# Patient Record
Sex: Male | Born: 1940 | Race: White | Hispanic: No | Marital: Married
Health system: Southern US, Community
[De-identification: ages and names within clinical notes are randomized; demographics above are authoritative.]

## PROBLEM LIST (undated history)

## (undated) DIAGNOSIS — I10 Essential (primary) hypertension: Secondary | ICD-10-CM

## (undated) DIAGNOSIS — L929 Granulomatous disorder of the skin and subcutaneous tissue, unspecified: Secondary | ICD-10-CM

## (undated) DIAGNOSIS — I825Z2 Chronic embolism and thrombosis of unspecified deep veins of left distal lower extremity: Secondary | ICD-10-CM

## (undated) DIAGNOSIS — I219 Acute myocardial infarction, unspecified: Secondary | ICD-10-CM

## (undated) DIAGNOSIS — D099 Carcinoma in situ, unspecified: Secondary | ICD-10-CM

## (undated) DIAGNOSIS — R972 Elevated prostate specific antigen [PSA]: Secondary | ICD-10-CM

## (undated) DIAGNOSIS — I2699 Other pulmonary embolism without acute cor pulmonale: Secondary | ICD-10-CM

## (undated) DIAGNOSIS — R21 Rash and other nonspecific skin eruption: Secondary | ICD-10-CM

## (undated) DIAGNOSIS — D689 Coagulation defect, unspecified: Secondary | ICD-10-CM

## (undated) DIAGNOSIS — D352 Benign neoplasm of pituitary gland: Secondary | ICD-10-CM

## (undated) DIAGNOSIS — N486 Induration penis plastica: Secondary | ICD-10-CM

## (undated) DIAGNOSIS — I82409 Acute embolism and thrombosis of unspecified deep veins of unspecified lower extremity: Secondary | ICD-10-CM

## (undated) DIAGNOSIS — N419 Inflammatory disease of prostate, unspecified: Secondary | ICD-10-CM

## (undated) DIAGNOSIS — D497 Neoplasm of unspecified behavior of endocrine glands and other parts of nervous system: Secondary | ICD-10-CM

## (undated) DIAGNOSIS — Z7901 Long term (current) use of anticoagulants: Secondary | ICD-10-CM

## (undated) DIAGNOSIS — J40 Bronchitis, not specified as acute or chronic: Secondary | ICD-10-CM

## (undated) DIAGNOSIS — I639 Cerebral infarction, unspecified: Secondary | ICD-10-CM

## (undated) DIAGNOSIS — R51 Headache: Principal | ICD-10-CM

## (undated) HISTORY — DX: Cerebral infarction, unspecified: I63.9

## (undated) HISTORY — DX: Essential (primary) hypertension: I10

## (undated) HISTORY — DX: Induration penis plastica: N48.6

## (undated) HISTORY — DX: Headache: R51

## (undated) HISTORY — DX: Other pulmonary embolism without acute cor pulmonale: I26.99

## (undated) HISTORY — PX: TONSILLECTOMY: SUR1361

## (undated) HISTORY — DX: Chronic embolism and thrombosis of unspecified deep veins of left distal lower extremity: I82.5Z2

## (undated) HISTORY — DX: Inflammatory disease of prostate, unspecified: N41.9

## (undated) HISTORY — DX: Coagulation defect, unspecified: D68.9

## (undated) HISTORY — PX: EYE SURGERY: SHX253

## (undated) HISTORY — DX: Carcinoma in situ, unspecified: D09.9

## (undated) HISTORY — PX: HERNIA REPAIR: SHX51

## (undated) HISTORY — DX: Granulomatous disorder of the skin and subcutaneous tissue, unspecified: L92.9

## (undated) HISTORY — DX: Long term (current) use of anticoagulants: Z79.01

## (undated) HISTORY — DX: Benign neoplasm of pituitary gland: D35.2

---
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
## 1999-05-31 ENCOUNTER — Encounter: Admission: RE | Admit: 1999-05-31 | Discharge: 1999-05-31 | Payer: Self-pay | Admitting: *Deleted

## 1999-09-23 ENCOUNTER — Encounter: Admission: RE | Admit: 1999-09-23 | Discharge: 1999-09-23 | Payer: Self-pay | Admitting: *Deleted

## 2000-06-19 ENCOUNTER — Emergency Department (HOSPITAL_COMMUNITY): Admission: EM | Admit: 2000-06-19 | Discharge: 2000-06-19 | Payer: Self-pay | Admitting: Emergency Medicine

## 2001-11-01 ENCOUNTER — Encounter: Payer: Self-pay | Admitting: Internal Medicine

## 2001-11-01 ENCOUNTER — Ambulatory Visit (HOSPITAL_COMMUNITY): Admission: RE | Admit: 2001-11-01 | Discharge: 2001-11-01 | Payer: Self-pay | Admitting: Internal Medicine

## 2002-04-16 ENCOUNTER — Ambulatory Visit (HOSPITAL_COMMUNITY): Admission: RE | Admit: 2002-04-16 | Discharge: 2002-04-16 | Payer: Self-pay | Admitting: Internal Medicine

## 2002-04-16 ENCOUNTER — Encounter: Payer: Self-pay | Admitting: Internal Medicine

## 2003-09-04 ENCOUNTER — Emergency Department (HOSPITAL_COMMUNITY): Admission: EM | Admit: 2003-09-04 | Discharge: 2003-09-04 | Payer: Self-pay | Admitting: Emergency Medicine

## 2005-02-26 ENCOUNTER — Encounter: Admission: RE | Admit: 2005-02-26 | Discharge: 2005-02-26 | Payer: Self-pay | Admitting: Internal Medicine

## 2005-04-08 ENCOUNTER — Encounter: Admission: RE | Admit: 2005-04-08 | Discharge: 2005-04-08 | Payer: Self-pay | Admitting: Neurology

## 2005-06-13 HISTORY — PX: OTHER SURGICAL HISTORY: SHX169

## 2005-08-18 ENCOUNTER — Emergency Department (HOSPITAL_COMMUNITY): Admission: EM | Admit: 2005-08-18 | Discharge: 2005-08-18 | Payer: Self-pay | Admitting: Emergency Medicine

## 2006-07-30 ENCOUNTER — Emergency Department (HOSPITAL_COMMUNITY): Admission: EM | Admit: 2006-07-30 | Discharge: 2006-07-30 | Payer: Self-pay | Admitting: Emergency Medicine

## 2009-02-05 ENCOUNTER — Encounter: Admission: RE | Admit: 2009-02-05 | Discharge: 2009-02-05 | Payer: Self-pay | Admitting: Internal Medicine

## 2010-09-06 ENCOUNTER — Ambulatory Visit
Admission: RE | Admit: 2010-09-06 | Discharge: 2010-09-06 | Disposition: A | Discharge status: Home / Self Care | Payer: Medicare Other | Source: Ambulatory Visit | Attending: Internal Medicine | Admitting: Internal Medicine

## 2010-09-06 ENCOUNTER — Other Ambulatory Visit: Payer: Self-pay | Admitting: Internal Medicine

## 2010-09-06 DIAGNOSIS — R5383 Other fatigue: Secondary | ICD-10-CM

## 2010-09-06 MED ORDER — IOHEXOL 300 MG/ML  SOLN
75.0000 mL | Freq: Once | INTRAMUSCULAR | Status: AC | PRN
  Administered 2010-09-06: 75 mL via INTRAVENOUS

## 2010-09-07 ENCOUNTER — Telehealth: Payer: Self-pay

## 2010-09-07 NOTE — Telephone Encounter (Signed)
Called and advised amy that Dr. Merla Riches needs to call and req to speak to a physcian to get pt worked in asap per TD  Carver Fila, CMA

## 2010-09-15 ENCOUNTER — Encounter: Payer: Self-pay | Admitting: Pulmonary Disease

## 2010-09-16 ENCOUNTER — Encounter: Payer: Self-pay | Admitting: Pulmonary Disease

## 2010-09-16 ENCOUNTER — Ambulatory Visit (INDEPENDENT_AMBULATORY_CARE_PROVIDER_SITE_OTHER): Payer: Medicare Other | Admitting: Pulmonary Disease

## 2010-09-16 VITALS — BP 112/70 | HR 56 | Temp 98.1°F | Ht 73.0 in | Wt 188.2 lb

## 2010-09-16 DIAGNOSIS — I2699 Other pulmonary embolism without acute cor pulmonale: Secondary | ICD-10-CM

## 2010-09-16 NOTE — Assessment & Plan Note (Signed)
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  He is on anticoagulation, and feels better with improved exertional tolerance.  I think he needs treatment for 6mos with coumadin, and discontinue at that point if doing well.  He apparently has had a hypercoagulable profile sent by [facility], but I do not have access to those results.  If not done, would check after being off coumadin for a period of time.  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  Barring any complications, he will f/u with me as needed.

## 2010-09-16 NOTE — Patient Instructions (Signed)
Continue on coumadin for a 6mos course, continue with INR monitoring as you are doing Take more frequent breaks on long drives, do ankle pumps followup with me as needed.

## 2010-09-16 NOTE — Progress Notes (Signed)
  Subjective:    Patient ID: Jay Day, male    DOB: Feb 05, 1941, 70 y.o.   MRN: [id number]  HPI The pt is a 70y/o male who I have been asked to see for pulmonary embolism.  He has a h/o calf dvt 83yrs ago, and was treated with aspirin.  He underwent CT chest last month for atypical cp, and was found to have findings c/w old granulomatous disease (went to school in [place], h/o neg. ppd), as well as subacute clot in RLL PA branches.  He was started on lovenox and coumadin, and being managed by an anticoagulation clinic.  He feels greatly improved, and has returned to his exercise regimen of swimming.  The pt has no FHx of VTE, and apparently a hypercoagulable panel was negative at the time of his diagnosis.  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  He has not had any recent ortho problems, nor has he been on long flights.        Review of Systems  Constitutional: Positive for unexpected weight change. Negative for fever and appetite change.  HENT: Positive for rhinorrhea. Negative for ear pain, congestion, sore throat, sneezing, trouble swallowing, dental problem and postnasal drip.   Eyes: Negative for redness.  Respiratory: Positive for shortness of breath. Negative for cough and wheezing.   Cardiovascular: Negative for chest pain, palpitations and leg swelling.  Gastrointestinal: Negative for nausea, abdominal pain and diarrhea.  Genitourinary: Negative for dysuria.  Musculoskeletal: Negative for joint swelling.  Skin: Negative for rash.  Neurological: Negative for syncope and headaches.  Hematological: Does not bruise/bleed easily.  Psychiatric/Behavioral: Negative for dysphoric mood. The patient is not nervous/anxious.        Objective:   Physical Exam Constitutional:  Well developed, no acute distress  HENT:  Nares patent without discharge.  Deviated septum to left with narrowing.  Oropharynx without  exudate, palate and uvula are normal  Eyes:  Perrla, eomi, no scleral icterus  Neck:  No JVD, no TMG  Cardiovascular:  Normal rate, regular rhythm, no rubs or gallops.  No murmurs        Intact distal pulses  Pulmonary :  Normal breath sounds, no stridor or respiratory distress   No rales, rhonchi, or wheezing  Abdominal:  Soft, nondistended, bowel sounds present.  No tenderness noted.   Musculoskeletal:  No lower extremity edema noted.  Lymph Nodes:  No cervical lymphadenopathy noted  Skin:  No cyanosis noted  Neurologic:  Alert, appropriate, moves all 4 extremities without obvious deficit.         Assessment & Plan:  Pulmonary embolism The pt has a RLL PE on recent ct chest without complications.  He is on anticoagulation, and feels better with improved exertional tolerance.  I think he needs treatment for 6mos with coumadin, and discontinue at that point if doing well.  He apparently has had a hypercoagulable profile sent by [facility], but I do not have access to those results.  If not done, would check after being off coumadin for a period of time.  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  Barring any complications, he will f/u with me as needed.

## 2010-09-18 ENCOUNTER — Encounter: Payer: Self-pay | Admitting: Pulmonary Disease

## 2011-06-30 ENCOUNTER — Ambulatory Visit (INDEPENDENT_AMBULATORY_CARE_PROVIDER_SITE_OTHER): Payer: Medicare Other | Admitting: General Surgery

## 2011-06-30 ENCOUNTER — Encounter (INDEPENDENT_AMBULATORY_CARE_PROVIDER_SITE_OTHER): Payer: Self-pay

## 2011-06-30 ENCOUNTER — Encounter (INDEPENDENT_AMBULATORY_CARE_PROVIDER_SITE_OTHER): Payer: Self-pay | Admitting: General Surgery

## 2011-06-30 VITALS — BP 108/72 | HR 65 | Temp 97.7°F | Ht 72.0 in | Wt 198.2 lb

## 2011-06-30 DIAGNOSIS — K4090 Unilateral inguinal hernia, without obstruction or gangrene, not specified as recurrent: Secondary | ICD-10-CM

## 2011-06-30 NOTE — Patient Instructions (Addendum)
You have a left inguinal hernia that is reducible. I do not find any evidence of recurrent hernia on the right side. You have decided to proceed with repair at this time. We will schedule the surgery at your convenience. We will plan to do this laparoscopically.  Inguinal Hernia, Adult Muscles help keep everything in the body in its proper place. But if a weak spot in the muscles develops, something can poke through. That is called a hernia. When this happens in the lower part of the belly (abdomen), it is called an inguinal hernia. (It takes its name from a part of the body in this region called the inguinal canal.) A weak spot in the wall of muscles lets some fat or part of the small intestine bulge through. An inguinal hernia can develop at any age. Men get them more often than women. CAUSES  In adults, an inguinal hernia develops over time.  It can be triggered by:   Suddenly straining the muscles of the lower abdomen.   Lifting heavy objects.   Straining to have a bowel movement. Difficult bowel movements (constipation) can lead to this.   Constant coughing. This may be caused by smoking or lung disease.   Being overweight.   Being pregnant.   Working at a job that requires long periods of standing or heavy lifting.   Having had an inguinal hernia before.  One type can be an emergency situation. It is called a strangulated inguinal hernia. It develops if part of the small intestine slips through the weak spot and cannot get back into the abdomen. The blood supply can be cut off. If that happens, part of the intestine may die. This situation requires emergency surgery. SYMPTOMS  Often, a small inguinal hernia has no symptoms. It is found when a healthcare provider does a physical exam. Larger hernias usually have symptoms.   In adults, symptoms may include:   A lump in the groin. This is easier to see when the person is standing. It might disappear when lying down.   In men, a  lump in the scrotum.   Pain or burning in the groin. This occurs especially when lifting, straining or coughing.   A dull ache or feeling of pressure in the groin.   Signs of a strangulated hernia can include:   A bulge in the groin that becomes very painful and tender to the touch.   A bulge that turns red or purple.   Fever, nausea and vomiting.   Inability to have a bowel movement or to pass gas.  DIAGNOSIS  To decide if you have an inguinal hernia, a healthcare provider will probably do a physical examination.  This will include asking questions about any symptoms you have noticed.   The healthcare provider might feel the groin area and ask you to cough. If an inguinal hernia is felt, the healthcare provider may try to slide it back into the abdomen.   Usually no other tests are needed.  TREATMENT  Treatments can vary. The size of the hernia makes a difference. Options include:  Watchful waiting. This is often suggested if the hernia is small and you have had no symptoms.   No medical procedure will be done unless symptoms develop.   You will need to watch closely for symptoms. If any occur, contact your healthcare provider right away.   Surgery. This is used if the hernia is larger or you have symptoms.   Open surgery. This is usually  an outpatient procedure (you will not stay overnight in a hospital). An cut (incision) is made through the skin in the groin. The hernia is put back inside the abdomen. The weak area in the muscles is then repaired by herniorrhaphy or hernioplasty. Herniorrhaphy: in this type of surgery, the weak muscles are sewn back together. Hernioplasty: a patch or mesh is used to close the weak area in the abdominal wall.   Laparoscopy. In this procedure, a surgeon makes small incisions. A thin tube with a tiny video camera (called a laparoscope) is put into the abdomen. The surgeon repairs the hernia with mesh by looking with the video camera and using two  long instruments.  HOME CARE INSTRUCTIONS   After surgery to repair an inguinal hernia:   You will need to take pain medicine prescribed by your healthcare provider. Follow all directions carefully.   You will need to take care of the wound from the incision.   Your activity will be restricted for awhile. This will probably include no heavy lifting for several weeks. You also should not do anything too active for a few weeks. When you can return to work will depend on the type of job that you have.   During "watchful waiting" periods, you should:   Maintain a healthy weight.   Eat a diet high in fiber (fruits, vegetables and whole grains).   Drink plenty of fluids to avoid constipation. This means drinking enough water and other liquids to keep your urine clear or pale yellow.   Do not lift heavy objects.   Do not stand for long periods of time.   Quit smoking. This should keep you from developing a frequent cough.  SEEK MEDICAL CARE IF:   A bulge develops in your groin area.   You feel pain, a burning sensation or pressure in the groin. This might be worse if you are lifting or straining.   You develop a fever of more than 100.5 F (38.1 C).  SEEK IMMEDIATE MEDICAL CARE IF:   Pain in the groin increases suddenly.   A bulge in the groin gets bigger suddenly and does not go down.   For men, there is sudden pain in the scrotum. Or, the size of the scrotum increases.   A bulge in the groin area becomes red or purple and is painful to touch.   You have nausea or vomiting that does not go away.   You feel your heart beating much faster than normal.   You cannot have a bowel movement or pass gas.   You develop a fever of more than 102.0 F (38.9 C).  Document Released: 10/16/2008 Document Revised: 02/09/2011 Document Reviewed: 10/16/2008 [facility] Patient Information 2012 [place], [place].

## 2011-06-30 NOTE — Progress Notes (Signed)
Patient ID: Jay Day, male   DOB: [birth year], 71 y.o.   MRN: [id number]  Chief Complaint  Patient presents with  . Pre-op Exam    eval hernia    HPI Jay Day is a 71 y.o. male.  He is referred to me by Dr. Theressa Millard for evaluation of a left inguinal hernia.  This is a healthy gentleman. He underwent a right inguinal hernia repair in 1962, and has had no problems on the right side since. He was recently evaluated by Dr. Patsi Sears for elevated PSA. He identified a left inguinal hernia. The patient really has minimal symptoms but now notices the bulge. His is interested in having this repaired before it gets any bigger.  Significant history is that he had deep venous thrombosis 2 years ago had a silent pulmonary embolism in March of 2012. He has been on Coumadin since that time. Dr. Earl Gala is contemplating taking him off the Coumadin at the end of this month. He also has hypertension and is known to have old granulomatous disease on chest CT. He is otherwise healthy and physically active. HPI  Past Medical History  Diagnosis Date  . Hypertension   . Asthma   . Pulmonary embolism   . Clotting disorder     Past Surgical History  Procedure Date  . Knee surgery 1962    left   . Hernia repair 1960    Family History  Problem Relation Age of Onset  . Pulmonary embolism Mother   . Pulmonary embolism Father   . Pulmonary embolism Maternal Grandmother   . Pulmonary embolism Maternal Grandfather   . Pulmonary embolism Paternal Grandmother   . Pulmonary embolism Paternal Grandfather     Social History History  Substance Use Topics  . Smoking status: Former Smoker -- 0.3 packs/day for 5 years    Types: Cigarettes    Quit date: 06/13/1970  . Smokeless tobacco: Never Used  . Alcohol Use: Yes     very little    No Known Allergies  Current Outpatient Prescriptions  Medication Sig Dispense Refill  . aspirin 81 MG tablet Take 81 mg by mouth daily.        .  captopril (CAPOTEN) 25 MG tablet 1/2 tab by mouth twice daily       . enoxaparin (LOVENOX) 80 MG/0.8ML SOLN Twice daily.      Marland Kitchen latanoprost (XALATAN) 0.005 % ophthalmic solution Place 1 drop into both eyes At bedtime.      Marland Kitchen warfarin (COUMADIN) 5 MG tablet Take as directed        Review of Systems Review of Systems  Constitutional: Negative for fever, chills and unexpected weight change.  HENT: Negative for hearing loss, congestion, sore throat, trouble swallowing and voice change.   Eyes: Negative for visual disturbance.  Respiratory: Negative for cough and wheezing.   Cardiovascular: Negative for chest pain, palpitations and leg swelling.  Gastrointestinal: Negative for nausea, vomiting, abdominal pain, diarrhea, constipation, blood in stool, abdominal distention, anal bleeding and rectal pain.  Genitourinary: Negative for hematuria and difficulty urinating.  Musculoskeletal: Negative for arthralgias.  Skin: Negative for rash and wound.  Neurological: Negative for seizures, syncope, weakness and headaches.  Hematological: Negative for adenopathy. Does not bruise/bleed easily.  Psychiatric/Behavioral: Negative for confusion.    Blood pressure 108/72, pulse 65, temperature 97.7 F (36.5 C), temperature source Temporal, height 6' (1.829 m), weight 198 lb 3.2 oz (89.903 kg), SpO2 96.00%.  Physical Exam Physical Exam  Constitutional:  He is oriented to person, place, and time. He appears well-developed and well-nourished. No distress.  HENT:  Head: Normocephalic.  Nose: Nose normal.  Mouth/Throat: No oropharyngeal exudate.  Eyes: Conjunctivae and EOM are normal. Pupils are equal, round, and reactive to light. Right eye exhibits no discharge. Left eye exhibits no discharge. No scleral icterus.  Neck: Normal range of motion. Neck supple. No JVD present. No tracheal deviation present. No thyromegaly present.  Cardiovascular: Normal rate, regular rhythm, normal heart sounds and intact  distal pulses.   No murmur heard. Pulmonary/Chest: Effort normal and breath sounds normal. No stridor. No respiratory distress. He has no wheezes. He has no rales. He exhibits no tenderness.  Abdominal: Soft. Bowel sounds are normal. He exhibits no distension and no mass. There is no tenderness. There is no rebound and no guarding.  Genitourinary:     Musculoskeletal: Normal range of motion. He exhibits no edema and no tenderness.  Lymphadenopathy:    He has no cervical adenopathy.  Neurological: He is alert and oriented to person, place, and time. He has normal reflexes. Coordination normal.  Skin: Skin is warm and dry. No rash noted. He is not diaphoretic. No erythema. No pallor.  Psychiatric: He has a normal mood and affect. His behavior is normal. Judgment and thought content normal.    Data Reviewed I reviewed the office notes from Dr. Theressa Millard and Dr. Marcine Matar.  Assessment    Left inguinal hernia, minimally symptomatic. Patient desires elective repair before this enlarges.  History right inguinal hernia repair 1962, no evidence of recurrence   History of pulmonary embolism and DVT, on Coumadin.  Hypertension, well controlled  Elevated PSA, under surveillance   Plan    At the patient's request, we will proceed with repair of his left inguinal hernia with mesh. We have discussed all the techniques and he would like to have this done laparoscopically, and I think that he is a good candidate for that.  I discussed the indications and details of surgery with him. Risks and complications have been outlined, including but not limited to bleeding, infection, conversion to open laparotomy, recurrence of the hernia, nerve damage with chronic pain or numbness, injury to adjacent organs such as the intestine or bladder with major surgery, cardiac pulmonary and thromboembolic problems. He understands these issues. His questions were answered. He agrees with this plan.  We  will discontinue his Coumadin 5 days preop, if okay with Dr. Earl Gala. It appears Dr. Earl Gala may be contemplating discontinuing the Coumadin at this time anyway.     Angelia Mould. Derrell Lolling, M.D., [facility] General and Minimally invasive Surgery Breast and Colorectal Surgery Office:   [phone] Pager:   [phone]    06/30/2011, 9:19 AM

## 2011-07-25 ENCOUNTER — Telehealth (INDEPENDENT_AMBULATORY_CARE_PROVIDER_SITE_OTHER): Payer: Self-pay

## 2011-07-25 NOTE — Telephone Encounter (Signed)
LMOM for pt that we are waiting on clearance from Dr Earl Gala for pt to d/c coumadin. I faxed request on 1-17 and refaxed it today and also lmom for his secretary to watch for request.

## 2011-07-29 ENCOUNTER — Telehealth (INDEPENDENT_AMBULATORY_CARE_PROVIDER_SITE_OTHER): Payer: Self-pay

## 2011-07-29 NOTE — Telephone Encounter (Signed)
Received clearance and ok to d/c coumadin. Orders to surgery scheduling and clearance to MR to scan.

## 2011-08-01 ENCOUNTER — Encounter (HOSPITAL_COMMUNITY): Payer: Self-pay | Admitting: Pharmacy Technician

## 2011-08-08 ENCOUNTER — Ambulatory Visit (HOSPITAL_COMMUNITY)
Admission: RE | Admit: 2011-08-08 | Discharge: 2011-08-08 | Disposition: A | Discharge status: Home / Self Care | Payer: Medicare Other | Source: Ambulatory Visit | Attending: Anesthesiology | Admitting: Anesthesiology

## 2011-08-08 ENCOUNTER — Encounter (HOSPITAL_COMMUNITY): Payer: Self-pay

## 2011-08-08 ENCOUNTER — Encounter (HOSPITAL_COMMUNITY)
Admission: RE | Admit: 2011-08-08 | Discharge: 2011-08-08 | Disposition: A | Discharge status: Home / Self Care | Payer: Medicare Other | Source: Ambulatory Visit | Attending: General Surgery | Admitting: General Surgery

## 2011-08-08 ENCOUNTER — Other Ambulatory Visit: Payer: Self-pay

## 2011-08-08 DIAGNOSIS — Z01810 Encounter for preprocedural cardiovascular examination: Secondary | ICD-10-CM | POA: Insufficient documentation

## 2011-08-08 DIAGNOSIS — Z01811 Encounter for preprocedural respiratory examination: Secondary | ICD-10-CM | POA: Insufficient documentation

## 2011-08-08 DIAGNOSIS — Z01818 Encounter for other preprocedural examination: Secondary | ICD-10-CM | POA: Insufficient documentation

## 2011-08-08 HISTORY — DX: Elevated prostate specific antigen (PSA): R97.20

## 2011-08-08 HISTORY — DX: Bronchitis, not specified as acute or chronic: J40

## 2011-08-08 HISTORY — DX: Neoplasm of unspecified behavior of endocrine glands and other parts of nervous system: D49.7

## 2011-08-08 LAB — SURGICAL PCR SCREEN: Staphylococcus aureus: POSITIVE — AB

## 2011-08-08 LAB — COMPREHENSIVE METABOLIC PANEL
ALT: 17 U/L (ref 0–53)
Albumin: 3.5 g/dL (ref 3.5–5.2)
Alkaline Phosphatase: 62 U/L (ref 39–117)
Chloride: 107 mEq/L (ref 96–112)
Potassium: 4.2 mEq/L (ref 3.5–5.1)
Sodium: 141 mEq/L (ref 135–145)
Total Bilirubin: 0.5 mg/dL (ref 0.3–1.2)
Total Protein: 6.8 g/dL (ref 6.0–8.3)

## 2011-08-08 LAB — DIFFERENTIAL
Basophils Relative: 0 % (ref 0–1)
Eosinophils Absolute: 0.3 10*3/uL (ref 0.0–0.7)
Eosinophils Relative: 5 % (ref 0–5)
Lymphs Abs: 1.1 10*3/uL (ref 0.7–4.0)
Monocytes Absolute: 0.9 10*3/uL (ref 0.1–1.0)
Monocytes Relative: 13 % — ABNORMAL HIGH (ref 3–12)

## 2011-08-08 LAB — CBC
HCT: 44.3 % (ref 39.0–52.0)
Hemoglobin: 15.4 g/dL (ref 13.0–17.0)
MCHC: 34.8 g/dL (ref 30.0–36.0)
RDW: 12.3 % (ref 11.5–15.5)
WBC: 6.3 10*3/uL (ref 4.0–10.5)

## 2011-08-08 NOTE — Pre-Procedure Instructions (Signed)
[address]  08/08/2011   Your procedure is scheduled on:  Friday August 12, 2011  Report to [facility] at 1130 AM.  Call this number if you have problems the morning of surgery: [phone]   Remember:   Do not eat food:After Midnight.  May have clear liquids: up to 4 Hours before arrival. ( up to 7:30am)  Clear liquids include soda, tea, black coffee, apple or grape juice, broth.  Take these medicines the morning of surgery with A SIP OF WATER: bromocriptine   Do not wear jewelry, make-up or nail polish.  Do not wear lotions, powders, or perfumes. You may wear deodorant.  Do not shave 48 hours prior to surgery.  Do not bring valuables to the hospital.  Contacts, dentures or bridgework may not be worn into surgery.  Leave suitcase in the car. After surgery it may be brought to your room.  For patients admitted to the hospital, checkout time is 11:00 AM the day of discharge.   Patients discharged the day of surgery will not be allowed to drive home.  Name and phone number of your driver: Yuki Purves [phone]  Special Instructions: CHG Shower Use Special Wash: 1/2 bottle night before surgery and 1/2 bottle morning of surgery.   Please read over the following fact sheets that you were given: Pain Booklet, Coughing and Deep Breathing, MRSA Information and Surgical Site Infection Prevention

## 2011-08-11 MED ORDER — CEFAZOLIN SODIUM-DEXTROSE 2-3 GM-% IV SOLR
2.0000 g | INTRAVENOUS | Status: AC
  Administered 2011-08-12: 2 g via INTRAVENOUS
  Filled 2011-08-11: qty 50

## 2011-08-11 MED ORDER — HEPARIN SODIUM (PORCINE) 5000 UNIT/ML IJ SOLN
5000.0000 [IU] | Freq: Once | INTRAMUSCULAR | Status: AC
  Administered 2011-08-12: 5000 [IU] via SUBCUTANEOUS
  Filled 2011-08-11: qty 1

## 2011-08-11 MED ORDER — CHLORHEXIDINE GLUCONATE 4 % EX LIQD
1.0000 "application " | Freq: Once | CUTANEOUS | Status: DC
  Filled 2011-08-11: qty 15

## 2011-08-11 NOTE — H&P (Signed)
Jay Day     MRN: [id number]   Description: 71 year old male  Provider: Ernestene Mention, MD  Department: [facility]        Diagnoses     Left inguinal hernia   - Primary    550.90   Vitals     BP Pulse Temp(Src) Ht Wt BMI    108/72  65  97.7 F (36.5 C) (Temporal)  6' (1.829 m)  198 lb 3.2 oz (89.903 kg)  26.88 kg/m2                     History and Physical    Ernestene Mention, MD   Patient ID: Jay Day, male   DOB: [birth year], 71 y.o.   MRN: [id number]    Chief Complaint   Patient presents with   .  Pre-op Exam       eval hernia        HPI Jay Day is a 71 y.o. male.  He is referred to me by Dr. Theressa Millard for evaluation of a left inguinal hernia.  This is a healthy gentleman. He underwent a right inguinal hernia repair in 1962, and has had no problems on the right side since. He was recently evaluated by Dr. Patsi Sears for elevated PSA. He identified a left inguinal hernia. The patient really has minimal symptoms but now notices the bulge. His is interested in having this repaired before it gets any bigger.  Significant history is that he had deep venous thrombosis 2 years ago had a silent pulmonary embolism in March of 2012. He has been on Coumadin since that time. Dr. Earl Gala is contemplating taking him off the Coumadin at the end of this month. He also has hypertension and is known to have old granulomatous disease on chest CT. He is otherwise healthy and physically active.     Past Medical History   Diagnosis  Date   .  Hypertension     .  Asthma     .  Pulmonary embolism     .  Clotting disorder         Past Surgical History   Procedure  Date   .  Knee surgery  1962       left    .  Hernia repair  1960       Family History   Problem  Relation  Age of Onset   .  Pulmonary embolism  Mother     .  Pulmonary embolism  Father     .  Pulmonary embolism  Maternal Grandmother     .  Pulmonary embolism  Maternal  Grandfather     .  Pulmonary embolism  Paternal Grandmother     .  Pulmonary embolism  Paternal Grandfather        Social History History   Substance Use Topics   .  Smoking status:  Former Smoker -- 0.3 packs/day for 5 years       Types:  Cigarettes       Quit date:  06/13/1970   .  Smokeless tobacco:  Never Used   .  Alcohol Use:  Yes         very little      No Known Allergies    Current Outpatient Prescriptions   Medication  Sig  Dispense  Refill   .  aspirin 81 MG tablet  Take 81 mg by mouth daily.           Marland Kitchen  captopril (CAPOTEN) 25 MG tablet  1/2 tab by mouth twice daily          .  enoxaparin (LOVENOX) 80 MG/0.8ML SOLN  Twice daily.         Marland Kitchen  latanoprost (XALATAN) 0.005 % ophthalmic solution  Place 1 drop into both eyes At bedtime.         Marland Kitchen  warfarin (COUMADIN) 5 MG tablet  Take as directed            Review of Systems   Constitutional: Negative for fever, chills and unexpected weight change.  HENT: Negative for hearing loss, congestion, sore throat, trouble swallowing and voice change.   Eyes: Negative for visual disturbance.  Respiratory: Negative for cough and wheezing.   Cardiovascular: Negative for chest pain, palpitations and leg swelling.  Gastrointestinal: Negative for nausea, vomiting, abdominal pain, diarrhea, constipation, blood in stool, abdominal distention, anal bleeding and rectal pain.  Genitourinary: Negative for hematuria and difficulty urinating.  Musculoskeletal: Negative for arthralgias.  Skin: Negative for rash and wound.  Neurological: Negative for seizures, syncope, weakness and headaches.  Hematological: Negative for adenopathy. Does not bruise/bleed easily.  Psychiatric/Behavioral: Negative for confusion.    Blood pressure 108/72, pulse 65, temperature 97.7 F (36.5 C), temperature source Temporal, height 6' (1.829 m), weight 198 lb 3.2 oz (89.903 kg), SpO2 96.00%.   Physical Exam   Constitutional: He is oriented to person,  place, and time. He appears well-developed and well-nourished. No distress.  HENT:   Head: Normocephalic.   Nose: Nose normal.   Mouth/Throat: No oropharyngeal exudate.  Eyes: Conjunctivae and EOM are normal. Pupils are equal, round, and reactive to light. Right eye exhibits no discharge. Left eye exhibits no discharge. No scleral icterus.  Neck: Normal range of motion. Neck supple. No JVD present. No tracheal deviation present. No thyromegaly present.  Cardiovascular: Normal rate, regular rhythm, normal heart sounds and intact distal pulses.    No murmur heard. Pulmonary/Chest: Effort normal and breath sounds normal. No stridor. No respiratory distress. He has no wheezes. He has no rales. He exhibits no tenderness.  Abdominal: Soft. Bowel sounds are normal. He exhibits no distension and no mass. There is no tenderness. There is no rebound and no guarding.  Genitourinary:  Well healed scar right groin, no hernia recurrence.  Moderate sized left inguinal hernia, reducible, does not extend into scrotum. Musculoskeletal: Normal range of motion. He exhibits no edema and no tenderness.  Lymphadenopathy:    He has no cervical adenopathy.  Neurological: He is alert and oriented to person, place, and time. He has normal reflexes. Coordination normal.  Skin: Skin is warm and dry. No rash noted. He is not diaphoretic. No erythema. No pallor.  Psychiatric: He has a normal mood and affect. His behavior is normal. Judgment and thought content normal.    Data Reviewed I reviewed the office notes from Dr. Theressa Millard and Dr. Marcine Matar.   Assessment Left inguinal hernia, minimally symptomatic. Patient desires elective repair before this enlarges.  History right inguinal hernia repair 1962, no evidence of recurrence   History of pulmonary embolism and DVT, on Coumadin.  Hypertension, well controlled  Elevated PSA, under surveillance    Plan At the patient's request, we will proceed with  repair of his left inguinal hernia with mesh. We have discussed all the techniques and he would like to have this done laparoscopically, and I think that he is a good candidate for that.  I discussed the indications and  details of surgery with him. Risks and complications have been outlined, including but not limited to bleeding, infection, conversion to open laparotomy, recurrence of the hernia, nerve damage with chronic pain or numbness, injury to adjacent organs such as the intestine or bladder with major surgery, cardiac pulmonary and thromboembolic problems. He understands these issues. His questions were answered. He agrees with this plan.  We will discontinue his Coumadin 5 days preop, if okay with Dr. Earl Gala. It appears Dr. Earl Gala may be contemplating discontinuing the Coumadin at this time anyway.   Angelia Mould. Derrell Lolling, M.D., [facility] General and Minimally invasive Surgery Breast and Colorectal Surgery Office:   [phone] Pager:   [phone]

## 2011-08-12 ENCOUNTER — Encounter (HOSPITAL_COMMUNITY): Payer: Self-pay | Admitting: Anesthesiology

## 2011-08-12 ENCOUNTER — Ambulatory Visit (HOSPITAL_COMMUNITY)
Admission: RE | Admit: 2011-08-12 | Discharge: 2011-08-12 | Disposition: A | Discharge status: Home / Self Care | Payer: Medicare Other | Source: Ambulatory Visit | Attending: General Surgery | Admitting: General Surgery

## 2011-08-12 ENCOUNTER — Encounter (HOSPITAL_COMMUNITY)
Admission: RE | Disposition: A | Discharge status: Home / Self Care | Payer: Self-pay | Source: Ambulatory Visit | Attending: General Surgery

## 2011-08-12 ENCOUNTER — Ambulatory Visit (HOSPITAL_COMMUNITY): Payer: Medicare Other | Admitting: Anesthesiology

## 2011-08-12 ENCOUNTER — Encounter (HOSPITAL_COMMUNITY): Payer: Self-pay | Admitting: *Deleted

## 2011-08-12 DIAGNOSIS — Z9889 Other specified postprocedural states: Secondary | ICD-10-CM

## 2011-08-12 DIAGNOSIS — Z86718 Personal history of other venous thrombosis and embolism: Secondary | ICD-10-CM | POA: Insufficient documentation

## 2011-08-12 DIAGNOSIS — Z7901 Long term (current) use of anticoagulants: Secondary | ICD-10-CM | POA: Insufficient documentation

## 2011-08-12 DIAGNOSIS — I10 Essential (primary) hypertension: Secondary | ICD-10-CM | POA: Insufficient documentation

## 2011-08-12 DIAGNOSIS — K4090 Unilateral inguinal hernia, without obstruction or gangrene, not specified as recurrent: Secondary | ICD-10-CM | POA: Insufficient documentation

## 2011-08-12 HISTORY — PX: INGUINAL HERNIA REPAIR: SHX194

## 2011-08-12 SURGERY — REPAIR, HERNIA, INGUINAL, LAPAROSCOPIC
Anesthesia: General | Site: Abdomen | Laterality: Left | Wound class: Clean

## 2011-08-12 MED ORDER — BUPIVACAINE-EPINEPHRINE 0.25% -1:200000 IJ SOLN
INTRAMUSCULAR | Status: DC | PRN
  Administered 2011-08-12: 1 mL
  Administered 2011-08-12: 7 mL

## 2011-08-12 MED ORDER — PROPOFOL 10 MG/ML IV EMUL
INTRAVENOUS | Status: DC | PRN
  Administered 2011-08-12: 200 mg via INTRAVENOUS

## 2011-08-12 MED ORDER — SODIUM CHLORIDE 0.9 % IJ SOLN: 3.0000 mL | Freq: Two times a day (BID) | INTRAMUSCULAR | Status: DC

## 2011-08-12 MED ORDER — FENTANYL CITRATE 0.05 MG/ML IJ SOLN
INTRAMUSCULAR | Status: DC | PRN
  Administered 2011-08-12 (×2): 100 ug via INTRAVENOUS
  Administered 2011-08-12: 50 ug via INTRAVENOUS

## 2011-08-12 MED ORDER — ACETAMINOPHEN 325 MG PO TABS: 650.0000 mg | ORAL_TABLET | ORAL | Status: DC | PRN

## 2011-08-12 MED ORDER — 0.9 % SODIUM CHLORIDE (POUR BTL) OPTIME
TOPICAL | Status: DC | PRN
  Administered 2011-08-12: 1000 mL

## 2011-08-12 MED ORDER — HYDROMORPHONE HCL PF 1 MG/ML IJ SOLN: 0.2500 mg | INTRAMUSCULAR | Status: DC | PRN

## 2011-08-12 MED ORDER — SODIUM CHLORIDE 0.9 % IV SOLN: INTRAVENOUS | Status: DC

## 2011-08-12 MED ORDER — MUPIROCIN 2 % EX OINT
TOPICAL_OINTMENT | CUTANEOUS | Status: AC
  Filled 2011-08-12: qty 22

## 2011-08-12 MED ORDER — ACETAMINOPHEN 650 MG RE SUPP: 650.0000 mg | RECTAL | Status: DC | PRN

## 2011-08-12 MED ORDER — MUPIROCIN 2 % EX OINT
TOPICAL_OINTMENT | Freq: Once | CUTANEOUS | Status: AC
  Administered 2011-08-12: 12:00:00 via NASAL

## 2011-08-12 MED ORDER — ONDANSETRON HCL 4 MG/2ML IJ SOLN
INTRAMUSCULAR | Status: DC | PRN
  Administered 2011-08-12: 4 mg via INTRAVENOUS

## 2011-08-12 MED ORDER — MIDAZOLAM HCL 5 MG/5ML IJ SOLN
INTRAMUSCULAR | Status: DC | PRN
  Administered 2011-08-12: 2 mg via INTRAVENOUS

## 2011-08-12 MED ORDER — SODIUM CHLORIDE 0.9 % IR SOLN
Status: DC | PRN
  Administered 2011-08-12: 1000 mL

## 2011-08-12 MED ORDER — SODIUM CHLORIDE 0.9 % IJ SOLN: 3.0000 mL | INTRAMUSCULAR | Status: DC | PRN

## 2011-08-12 MED ORDER — ONDANSETRON HCL 4 MG/2ML IJ SOLN: 4.0000 mg | Freq: Four times a day (QID) | INTRAMUSCULAR | Status: DC | PRN

## 2011-08-12 MED ORDER — NEOSTIGMINE METHYLSULFATE 1 MG/ML IJ SOLN
INTRAMUSCULAR | Status: DC | PRN
  Administered 2011-08-12: 5 mg via INTRAVENOUS

## 2011-08-12 MED ORDER — ROCURONIUM BROMIDE 100 MG/10ML IV SOLN
INTRAVENOUS | Status: DC | PRN
  Administered 2011-08-12: 10 mg via INTRAVENOUS
  Administered 2011-08-12: 50 mg via INTRAVENOUS

## 2011-08-12 MED ORDER — EPHEDRINE SULFATE 50 MG/ML IJ SOLN
INTRAMUSCULAR | Status: DC | PRN
  Administered 2011-08-12: 5 mg via INTRAVENOUS

## 2011-08-12 MED ORDER — OXYCODONE HCL 5 MG PO TABS: 5.0000 mg | ORAL_TABLET | ORAL | Status: DC | PRN

## 2011-08-12 MED ORDER — GLYCOPYRROLATE 0.2 MG/ML IJ SOLN
INTRAMUSCULAR | Status: DC | PRN
  Administered 2011-08-12: 0.6 mg via INTRAVENOUS

## 2011-08-12 MED ORDER — MORPHINE SULFATE 2 MG/ML IJ SOLN: 2.0000 mg | INTRAMUSCULAR | Status: DC | PRN

## 2011-08-12 MED ORDER — SODIUM CHLORIDE 0.9 % IV SOLN: 250.0000 mL | INTRAVENOUS | Status: DC | PRN

## 2011-08-12 MED ORDER — HYDROCODONE-ACETAMINOPHEN 5-325 MG PO TABS: 1.0000 | ORAL_TABLET | ORAL | Status: AC | PRN

## 2011-08-12 MED ORDER — LACTATED RINGERS IV SOLN
INTRAVENOUS | Status: DC
  Administered 2011-08-12 (×2): via INTRAVENOUS

## 2011-08-12 SURGICAL SUPPLY — 44 items
ADH SKN CLS APL DERMABOND .7 (GAUZE/BANDAGES/DRESSINGS) ×3
APPLIER CLIP LOGIC TI 5 (MISCELLANEOUS) IMPLANT
APR CLP MED LRG 33X5 (MISCELLANEOUS)
CANISTER SUCTION 2500CC (MISCELLANEOUS) ×1 IMPLANT
CLOTH BEACON ORANGE TIMEOUT ST (SAFETY) ×2 IMPLANT
COVER SURGICAL LIGHT HANDLE (MISCELLANEOUS) ×2 IMPLANT
DECANTER SPIKE VIAL GLASS SM (MISCELLANEOUS) ×1 IMPLANT
DERMABOND ADVANCED (GAUZE/BANDAGES/DRESSINGS) ×3
DERMABOND ADVANCED .7 DNX12 (GAUZE/BANDAGES/DRESSINGS) ×1 IMPLANT
DEVICE SECURE STRAP 25 ABSORB (INSTRUMENTS) IMPLANT
DISSECT BALLN SPACEMKR + OVL (BALLOONS) ×2
DISSECTOR BALLN SPACEMKR + OVL (BALLOONS) ×1 IMPLANT
DISSECTOR BLUNT TIP ENDO 5MM (MISCELLANEOUS) IMPLANT
ELECT REM PT RETURN 9FT ADLT (ELECTROSURGICAL) ×2
ELECTRODE REM PT RTRN 9FT ADLT (ELECTROSURGICAL) ×1 IMPLANT
GLOVE BIOGEL M 6.5 STRL (GLOVE) ×1 IMPLANT
GLOVE BIOGEL PI IND STRL 6.5 (GLOVE) IMPLANT
GLOVE BIOGEL PI IND STRL 7.0 (GLOVE) IMPLANT
GLOVE BIOGEL PI INDICATOR 6.5 (GLOVE) ×1
GLOVE BIOGEL PI INDICATOR 7.0 (GLOVE) ×3
GLOVE ECLIPSE 6.5 STRL STRAW (GLOVE) ×1 IMPLANT
GLOVE EUDERMIC 7 POWDERFREE (GLOVE) ×2 IMPLANT
GLOVE EXAM NITRILE MD LF STRL (GLOVE) ×2 IMPLANT
GLOVE SURG SS PI 6.5 STRL IVOR (GLOVE) ×1 IMPLANT
GOWN PREVENTION PLUS XLARGE (GOWN DISPOSABLE) ×2 IMPLANT
GOWN STRL NON-REIN LRG LVL3 (GOWN DISPOSABLE) ×6 IMPLANT
KIT BASIN OR (CUSTOM PROCEDURE TRAY) ×2 IMPLANT
KIT ROOM TURNOVER OR (KITS) ×2 IMPLANT
MARKER SKIN DUAL TIP RULER LAB (MISCELLANEOUS) ×1 IMPLANT
MESH ULTRAPRO 3X6 7.6X15CM (Mesh General) ×1 IMPLANT
NDL INSUFFLATION 14GA 120MM (NEEDLE) ×1 IMPLANT
NEEDLE INSUFFLATION 14GA 120MM (NEEDLE) ×2 IMPLANT
NS IRRIG 1000ML POUR BTL (IV SOLUTION) ×2 IMPLANT
PAD ARMBOARD 7.5X6 YLW CONV (MISCELLANEOUS) ×4 IMPLANT
SET IRRIG TUBING LAPAROSCOPIC (IRRIGATION / IRRIGATOR) ×1 IMPLANT
SLEEVE ENDOPATH XCEL 5M (ENDOMECHANICALS) ×2 IMPLANT
STAPLER VISISTAT 35W (STAPLE) ×1 IMPLANT
SUT MNCRL AB 4-0 PS2 18 (SUTURE) ×2 IMPLANT
TACKER 5MM HERNIA 3.5CML NAB (ENDOMECHANICALS) IMPLANT
TOWEL OR 17X24 6PK STRL BLUE (TOWEL DISPOSABLE) ×2 IMPLANT
TOWEL OR 17X26 10 PK STRL BLUE (TOWEL DISPOSABLE) ×2 IMPLANT
TRAY FOLEY CATH 14FR (SET/KITS/TRAYS/PACK) ×2 IMPLANT
TRAY LAPAROSCOPIC (CUSTOM PROCEDURE TRAY) ×2 IMPLANT
TROCAR XCEL NON-BLD 5MMX100MML (ENDOMECHANICALS) ×2 IMPLANT

## 2011-08-12 NOTE — Anesthesia Postprocedure Evaluation (Signed)
  Anesthesia Post-op Note  Patient: Jay Day  Procedure(s) Performed: Procedure(s) (LRB): LAPAROSCOPIC INGUINAL HERNIA (Left) INSERTION OF MESH (Left)  Patient Location: PACU  Anesthesia Type: General  Level of Consciousness: awake, alert  and oriented  Airway and Oxygen Therapy: Patient Spontanous Breathing and Patient connected to nasal cannula oxygen  Post-op Pain: mild  Post-op Assessment: Post-op Vital signs reviewed and Patient's Cardiovascular Status Stable  Post-op Vital Signs: stable  Complications: No apparent anesthesia complications

## 2011-08-12 NOTE — Anesthesia Preprocedure Evaluation (Signed)
Anesthesia Evaluation  Patient identified by MRN, date of birth, ID band Patient awake    Reviewed: Allergy & Precautions, H&P , NPO status , Patient's Chart, lab work & pertinent test results  History of Anesthesia Complications Negative for: history of anesthetic complications  Airway Mallampati: II TM Distance: >3 FB Neck ROM: Full    Dental No notable dental hx. (+) Teeth Intact and Dental Advisory Given   Pulmonary neg pulmonary ROS,  H/o PE  a year ago, off of Coumadin for 4 weeks clear to auscultation  Pulmonary exam normal       Cardiovascular hypertension, Pt. on medications Regular Normal    Neuro/Psych Negative Neurological ROS     GI/Hepatic negative GI ROS, Neg liver ROS,   Endo/Other  Negative Endocrine ROS  Renal/GU negative Renal ROS     Musculoskeletal   Abdominal   Peds  Hematology negative hematology ROS (+)   Anesthesia Other Findings   Reproductive/Obstetrics                           Anesthesia Physical Anesthesia Plan  ASA: II  Anesthesia Plan: General   Post-op Pain Management:    Induction: Intravenous  Airway Management Planned: LMA  Additional Equipment:   Intra-op Plan:   Post-operative Plan:   Informed Consent: I have reviewed the patients History and Physical, chart, labs and discussed the procedure including the risks, benefits and alternatives for the proposed anesthesia with the patient or authorized representative who has indicated his/her understanding and acceptance.   Dental advisory given  Plan Discussed with: CRNA and Surgeon  Anesthesia Plan Comments: (Plan routine monitors, GA- LMA ok)        Anesthesia Quick Evaluation

## 2011-08-12 NOTE — Interval H&P Note (Signed)
History and Physical Interval Note:  08/12/2011 1:20 PM  Jay Day  has presented today for surgery, with the diagnosis of inguial hernia  The goals of treatment and the  various methods of treatment have been discussed with the patient and family. After consideration of risks, benefits and other options for treatment, the patient has consented to  Procedure(s) (LRB): LAPAROSCOPIC REPAIR LEFT INGUINAL HERNIA (Left) INSERTION OF MESH (Left) as a surgical intervention .  The patients' history has been reviewed, patient examined, no change in status, stable for surgery.  I have reviewed the patients' chart and labs.  Questions were answered to the patient's satisfaction.     Ernestene Mention

## 2011-08-12 NOTE — Op Note (Signed)
Patient Name:           Jay Day   Date of Surgery:        08/12/2011  Pre op Diagnosis:      Left inguinal hernia  Post op Diagnosis:    Direct Left inguinal hernia  Procedure:                 Laparoscopic, pre- peritoneal repair left inguinal hernia with ultra Pro mesh  Surgeon:                     Angelia Mould. Derrell Lolling, M.D., FACS  Assistant:                      none  Operative Indications:   This is a 71 year old gentleman who had a right inguinal hernia repair in 1962 and has had no problems on that side. He recently  was found to have a left inguinal hernia. He has minimal pain but notices a bulge. The patient desires to have this repaired before it gets much bigger. He has recently discontinued Coumadin because of episode of deep venous thrombosis one year ago. On exam he has a moderate size left inguinal hernia that does not extend into the scrotum it is reducible. He is operated on electively.  Operative Findings:       He had a moderately large, direct left inguinal hernia. He did not have an an indirect hernia.  Procedure in Detail:          Following the induction of general endotracheal anesthesia, a Foley catheter was inserted and the bladder was emptied. The abdomen and genitalia were prepped and draped in sterile fashion. Intravenous antibiotics were given. Surgical time out was performed. 0.5% Marcaine with epinephrine was used as a local infiltration anesthetic. A transverse incision was made at the lower rim of the umbilicus. The fascia was incised transversely exposing the left rectus muscle. A spacemaker balloon was inserted into the left rectus sheath in the midline. Videocamera was inserted. The dissector balloon was inflated under direct vision. We can see the muscles anteriorly, peritoneum posteriorly, and Cooper's ligaments inferiorly. We held the balloon in place for 5 minutes and then deflated and removed it. The trocar balloon was inflated and secured and the trocar  connected to the insufflator at 14 mm of mercury. We placed a 5 mm trocar in the midline below the umbilicus, and then we cleaned off the fascia in the right lower quadrant and inserted a 5 mm trocar in the right lower quadrant under direct vision. We then identified and cleaned off the Cooper's ligaments on both sides. There was some preperitoneal fat incarcerated in a large direct left inguinal hernia. This was removed and the large hernia space was observed. I dissected out the left cord structures. I identified the testicular vessels and the vas deferens. I identified the edge of the peritoneum and it was well back above the internal ring, so there was no evidence of indirect hernia. I cleaned off the lateral abdominal wall as well. I inserted a 3" x 6" piece of UltraPromesh and  positioned this transversely. She was positioned so as to overlap the midline slightly, overlap Cooper's ligament inferior slightly, and then deployed laterally. The mesh was secured with a 5 mm pro-tacker. 4 tacks were placed along the superior rim of Cooper's ligament. Tacks were placed up along the midline and then across the posterior belly of the rectus  muscle. I placed 3 tacks laterally and made sure that I could palpate the tacker through  the abdominal wall to make sure it stayed above the iliopubic tract. The mesh fixation looked good. There was no bleeding. The trocars were removed. A Veress needle was placed in the right upper quadrant to release the pneumoperitoneum. All the air was evacuated. The fascia at the umbilicus was closed with figure-of-eight sutures of 0 Vicryl. All the skin incisions were closed with subcuticular sutures of 4-0 Monocryl and Dermabond. Patient tolerated the procedure well. He was taken to the recovery room stable. EBL 20 cc. Counts correct.     Angelia Mould. Derrell Lolling, M.D., FACS General and Minimally Invasive Surgery Breast and Colorectal Surgery  08/12/2011 3:28 PM

## 2011-08-12 NOTE — Progress Notes (Signed)
CLIENT STATES DOES NOT WANT IN AND OUT CATH AND STATES WANTS TO GO HOME AND ADVISED NEEDS TO GO TO ER IF ANY PROBLEMS AND VOICED UNDERSTANDING AND WIFE VOICED UNDERSTANDING

## 2011-08-12 NOTE — Transfer of Care (Signed)
Immediate Anesthesia Transfer of Care Note  Patient: Jay Day  Procedure(s) Performed: Procedure(s) (LRB): LAPAROSCOPIC INGUINAL HERNIA (Left) INSERTION OF MESH (Left)  Patient Location: PACU  Anesthesia Type: General  Level of Consciousness: awake, alert  and oriented  Airway & Oxygen Therapy: Patient Spontanous Breathing and Patient connected to nasal cannula oxygen  Post-op Assessment: Report given to PACU RN, Post -op Vital signs reviewed and stable, Patient moving all extremities and Patient moving all extremities X 4  Post vital signs: Reviewed and stable  Complications: No apparent anesthesia complications

## 2011-08-16 ENCOUNTER — Encounter (HOSPITAL_COMMUNITY): Payer: Self-pay | Admitting: General Surgery

## 2011-08-22 ENCOUNTER — Other Ambulatory Visit: Payer: Self-pay | Admitting: Internal Medicine

## 2011-08-22 ENCOUNTER — Ambulatory Visit
Admission: RE | Admit: 2011-08-22 | Discharge: 2011-08-22 | Disposition: A | Discharge status: Home / Self Care | Payer: Medicare Other | Source: Ambulatory Visit | Attending: Internal Medicine | Admitting: Internal Medicine

## 2011-08-22 DIAGNOSIS — M79605 Pain in left leg: Secondary | ICD-10-CM

## 2011-09-01 ENCOUNTER — Ambulatory Visit (INDEPENDENT_AMBULATORY_CARE_PROVIDER_SITE_OTHER): Payer: Medicare Other | Admitting: General Surgery

## 2011-09-01 ENCOUNTER — Encounter (INDEPENDENT_AMBULATORY_CARE_PROVIDER_SITE_OTHER): Payer: Self-pay | Admitting: General Surgery

## 2011-09-01 VITALS — BP 138/80 | HR 68 | Temp 98.2°F | Resp 18 | Ht 73.0 in | Wt 197.0 lb

## 2011-09-01 DIAGNOSIS — K4090 Unilateral inguinal hernia, without obstruction or gangrene, not specified as recurrent: Secondary | ICD-10-CM

## 2011-09-01 NOTE — Patient Instructions (Signed)
You have recovered from your laparoscopic left inguinal hernia repair without any obvious complications. The repair is intact. You may resume all normal physical activities without restriction after April 1. Return to see Dr. Derrell Lolling if any new problems arise.

## 2011-09-01 NOTE — Progress Notes (Signed)
Subjective:     Patient ID: Jay Day, male   DOB: 07/15/1940, 71 y.o.   MRN: [id number]  HPI This gentleman underwent laparoscopic repair of a left inguinal hernia with mesh on March 1. The surgery was uneventful. He has done very well. Minimal pain. Asking when he can swim, play golf, and go back to ballroom dancing.  Review of Systems     Objective:   Physical Exam Patient looks well. In no distress.  Abdomen:         soft and nontender. All of the trocar sites are well healed.  Genitourinary:  Left groin shows no tenderness no fluid collection no seroma. The hernia repair is intact and solid. Penis scrotum and testes are normal.    Assessment:     Left inguinal hernia, uneventful recovery following laparoscopic repair with mesh.    Plan:     Okay to resume all normal physical activities without restriction after April 1.  Stretching exercises discussed.  Return to see me p.r.n.   Angelia Mould. Derrell Lolling, M.D., [facility] General and Minimally invasive Surgery Breast and Colorectal Surgery Office:   [phone] Pager:   [phone]

## 2011-09-11 ENCOUNTER — Ambulatory Visit (INDEPENDENT_AMBULATORY_CARE_PROVIDER_SITE_OTHER): Payer: Medicare Other | Admitting: Internal Medicine

## 2011-09-11 ENCOUNTER — Encounter: Payer: Self-pay | Admitting: Internal Medicine

## 2011-09-11 ENCOUNTER — Ambulatory Visit: Payer: Medicare Other

## 2011-09-11 VITALS — BP 106/67 | HR 67 | Resp 16

## 2011-09-11 DIAGNOSIS — R0600 Dyspnea, unspecified: Secondary | ICD-10-CM

## 2011-09-11 DIAGNOSIS — Z86711 Personal history of pulmonary embolism: Secondary | ICD-10-CM

## 2011-09-11 DIAGNOSIS — R0609 Other forms of dyspnea: Secondary | ICD-10-CM

## 2011-09-11 DIAGNOSIS — D352 Benign neoplasm of pituitary gland: Secondary | ICD-10-CM | POA: Insufficient documentation

## 2011-09-11 DIAGNOSIS — E237 Disorder of pituitary gland, unspecified: Secondary | ICD-10-CM

## 2011-09-11 DIAGNOSIS — I10 Essential (primary) hypertension: Secondary | ICD-10-CM | POA: Insufficient documentation

## 2011-09-11 NOTE — Progress Notes (Signed)
  Subjective:    Patient ID: Jay Day, male    DOB: 03-Apr-1941, 71 y.o.   MRN: [id number]  HPI35 year old who had a pulmonary embolus with very atypical presentation in March of 2012. He discontinued Coumadin in January 2013 in preparation for hernia repair which was done on 08/12/2011. Over the past 3 weeks he has noticed intermittent dyspnea on exertion when he does his daily walk up a steep hills. He has had no chest pain, palpitations, nausea, diaphoresis. He has had no evidence of deep vein thrombosis. Last March he had DVT in the left calf detected by Doppler which was asymptomatic. He had a superficial thrombosis on his right leg last week which went away with aspirin therapy. I don't have the results of his hyper coagulable panel from last year. His primary care is Dr. Earl Gala. 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   Review of SystemsNo change in weight or activity except related to his surgery. No vision changes No headache History of hypertension controlled on meds without symptoms No cough or wheezing No abdominal pain No peripheral edema No fever or night sweats    Objective:   Physical Exam Vital signs are stable HEENT is clear including thyroid The heart is regular without murmurs rubs or gallops and has a rate of 65 Lungs are clear to auscultation His extremities have no edema, no swelling, no palpable masses or cords, and no tenderness. Peripheral pulses are full      pulse ox 97% [facility] reading (PRIMARY) by  Dr. Merla Riches Increased markings in the left lower lobe/no effusion  EKG is no change from the past and is considered normal  Assessment & Plan:  Problem #1 recent dyspnea on exertion Problem #2 history of pulmonary embolus  It is imperative that he be rescanned with a CT angiogram to rule out a recurrence of his embolus We will try to obtain his blood studies  from last year

## 2011-09-19 ENCOUNTER — Encounter: Payer: Self-pay | Admitting: Internal Medicine

## 2011-09-19 ENCOUNTER — Ambulatory Visit (INDEPENDENT_AMBULATORY_CARE_PROVIDER_SITE_OTHER): Payer: Medicare Other | Admitting: Internal Medicine

## 2011-09-19 ENCOUNTER — Ambulatory Visit
Admission: RE | Admit: 2011-09-19 | Discharge: 2011-09-19 | Disposition: A | Discharge status: Home / Self Care | Payer: Medicare Other | Source: Ambulatory Visit | Attending: Internal Medicine | Admitting: Internal Medicine

## 2011-09-19 VITALS — BP 137/77 | HR 58 | Temp 97.6°F | Resp 16 | Ht 73.5 in | Wt 197.2 lb

## 2011-09-19 DIAGNOSIS — Z86711 Personal history of pulmonary embolism: Secondary | ICD-10-CM

## 2011-09-19 DIAGNOSIS — R0600 Dyspnea, unspecified: Secondary | ICD-10-CM

## 2011-09-19 DIAGNOSIS — I2699 Other pulmonary embolism without acute cor pulmonale: Secondary | ICD-10-CM

## 2011-09-19 DIAGNOSIS — R0609 Other forms of dyspnea: Secondary | ICD-10-CM

## 2011-09-19 MED ORDER — IOHEXOL 350 MG/ML SOLN
125.0000 mL | Freq: Once | INTRAVENOUS | Status: AC | PRN
  Administered 2011-09-19: 125 mL via INTRAVENOUS

## 2011-09-19 MED ORDER — ENOXAPARIN SODIUM 120 MG/0.8ML ~~LOC~~ SOLN: 120.0000 mg | Freq: Every day | SUBCUTANEOUS | Status: DC

## 2011-09-19 MED ORDER — WARFARIN SODIUM 5 MG PO TABS: 5.0000 mg | ORAL_TABLET | Freq: Every day | ORAL | Status: DC

## 2011-09-19 NOTE — Patient Instructions (Signed)
Start Lovenox 1 injection daily. Start Coumadin 5 mg daily. Return to Dr. Newell Coral office for blood work on Friday morning Call me or follow up here for any problems before that time.

## 2011-09-19 NOTE — Progress Notes (Signed)
  Subjective:    Patient ID: Jay Day, male    DOB: December 22, 1940, 71 y.o.   MRN: [id number]  HPIReturns after having pulmonary angiography this morning which revealed an extension of his old pulmonary embolus from March of 2012 with some new areas of clotting distally that are fresh. He remains symptomatic only with exercise and has no acute symptoms of chest pain, palpitations, or shortness of breath. The exam of his distal extremities was negative for clot last week.    Review of Systems     Objective:   Physical Exam        Assessment & Plan:

## 2011-09-30 ENCOUNTER — Telehealth: Payer: Self-pay | Admitting: Oncology

## 2011-09-30 NOTE — Telephone Encounter (Signed)
lmonvm for pt re calling me for appt w/JG. °

## 2011-10-03 ENCOUNTER — Telehealth: Payer: Self-pay | Admitting: Oncology

## 2011-10-03 NOTE — Telephone Encounter (Signed)
lmonvm for pt re calling for appt w/JG. 2nd attempt.

## 2011-10-05 ENCOUNTER — Telehealth: Payer: Self-pay | Admitting: Oncology

## 2011-10-05 NOTE — Telephone Encounter (Signed)
lmonvm for pt re calling me for appt w/JG. 3rd attempt. Although. Pt has returned one call (4/22) and lm for me to call him back @ # listed in EPIC I am not able to schedule new pt appt until I can speak directly w/pt to confirm. Pt was informed of this in the vm that I left for him.

## 2011-10-05 NOTE — Telephone Encounter (Signed)
S/w pt today re appt for 5/1 @ 3 pm w/JG.

## 2011-10-06 ENCOUNTER — Telehealth: Payer: Self-pay | Admitting: Oncology

## 2011-10-06 NOTE — Telephone Encounter (Signed)
Referred by Dr. 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

## 2011-10-12 ENCOUNTER — Other Ambulatory Visit: Payer: Medicare Other | Admitting: Lab

## 2011-10-12 ENCOUNTER — Ambulatory Visit (HOSPITAL_BASED_OUTPATIENT_CLINIC_OR_DEPARTMENT_OTHER): Payer: Medicare Other | Admitting: Oncology

## 2011-10-12 ENCOUNTER — Ambulatory Visit: Payer: Medicare Other

## 2011-10-12 VITALS — BP 133/56 | HR 88 | Temp 98.3°F | Ht 73.0 in | Wt 197.4 lb

## 2011-10-12 DIAGNOSIS — L929 Granulomatous disorder of the skin and subcutaneous tissue, unspecified: Secondary | ICD-10-CM

## 2011-10-12 DIAGNOSIS — I825Z2 Chronic embolism and thrombosis of unspecified deep veins of left distal lower extremity: Secondary | ICD-10-CM

## 2011-10-12 DIAGNOSIS — I2699 Other pulmonary embolism without acute cor pulmonale: Secondary | ICD-10-CM

## 2011-10-12 DIAGNOSIS — D689 Coagulation defect, unspecified: Secondary | ICD-10-CM

## 2011-10-12 DIAGNOSIS — Z7901 Long term (current) use of anticoagulants: Secondary | ICD-10-CM

## 2011-10-12 DIAGNOSIS — I82402 Acute embolism and thrombosis of unspecified deep veins of left lower extremity: Secondary | ICD-10-CM

## 2011-10-12 DIAGNOSIS — D696 Thrombocytopenia, unspecified: Secondary | ICD-10-CM

## 2011-10-12 DIAGNOSIS — D352 Benign neoplasm of pituitary gland: Secondary | ICD-10-CM

## 2011-10-13 ENCOUNTER — Encounter: Payer: Self-pay | Admitting: Oncology

## 2011-10-13 ENCOUNTER — Telehealth: Payer: Self-pay | Admitting: Oncology

## 2011-10-13 DIAGNOSIS — I825Z2 Chronic embolism and thrombosis of unspecified deep veins of left distal lower extremity: Secondary | ICD-10-CM

## 2011-10-13 DIAGNOSIS — D352 Benign neoplasm of pituitary gland: Secondary | ICD-10-CM

## 2011-10-13 DIAGNOSIS — H409 Unspecified glaucoma: Secondary | ICD-10-CM | POA: Insufficient documentation

## 2011-10-13 DIAGNOSIS — L929 Granulomatous disorder of the skin and subcutaneous tissue, unspecified: Secondary | ICD-10-CM | POA: Insufficient documentation

## 2011-10-13 HISTORY — DX: Granulomatous disorder of the skin and subcutaneous tissue, unspecified: L92.9

## 2011-10-13 HISTORY — DX: Benign neoplasm of pituitary gland: D35.2

## 2011-10-13 HISTORY — DX: Chronic embolism and thrombosis of unspecified deep veins of left distal lower extremity: I82.5Z2

## 2011-10-13 NOTE — Progress Notes (Signed)
New Patient Hematology-Oncology Evaluation   Jay Day [id number] Apr 18, 1941 71 y.o. 10/13/2011  CC: Dr. Barbette Hair. Little; Dr. Theressa Millard; Dr. 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 Back in March of 2012 he noticed that he was getting winded very easily when he swam. Quite surprisingly further evaluation revealed that he had suffered an acute right sided pulmonary embolism documented on CT scan 09/06/2010 which showed a filling defect in the proximal right pulmonary artery to the right lower lobe. Other findings were calcified right hilar and mediastinal lymph nodes as well as calcified granulomas within the liver and spleen. He was treated as an outpatient. He discontinued Coumadin at his physician's advice in January of this year. He went on to have a laparoscopic inguinal hernia repair on March 1. He just started to get back to his regular exercise program on March 31 and again noted increased dyspnea when swimming. A repeat CT angiogram was done on 09/19/2011 and again showed filling defects in the right pulmonary artery in the same area as the previous clot however there appeared to be acute components extending into the lobar branches. A tiny 4 mm peripheral right lower lobe nodule likely stable compared with the 2012 study and scattered pleural parenchymal scarring and calcified mediastinal and hilar lymph nodes were noted. No evidence of pulmonary infarction. He was put back on anticoagulation. Lower extremity venous Doppler studies done at time of initial pulmonary embolus in March 2012 did show evidence for a recent left deep venous thrombosis age-indeterminate in the greater saphenous vein midcalf over a very short segment. He never noticed any clinical swelling or pain of his leg. He does spend a  lot of this time driving probably 5 or more days of week in his occupation. He has no other obvious risk factors for clotting. He is a nonsmoker. He has no constitutional symptoms. No signs or symptoms of a collagen vascular disorder. He is fairly up-to-date on his health maintenance. Last colonoscopy was done about 5 years ago. He has had no change in bowel habit. No hematochezia or melena. There is no family history of blood clots in his parents, ( his father died at age 92 of old age mother at age 30 of complications of renal failure), his single male sibling who is 33 years old, or his 2 children a daughter 58 and a son 46.  I do not find any record of special hematology studies done in the past subsequent to his first blood clot or more recently.  PMH: Past Medical History  Diagnosis Date  . Pulmonary embolism   . Clotting disorder   . Asthma     hx of as child  . Bronchitis     as a child  . 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 Of interest, his only sibling also has a pituitary tumor.      takes medication to manage  . Hypertension     sees Dr. Earl Gala, primary   . Elevated PSA     being monitored by physician  . Cataract   Chart records also mentioned thyroiditis and I forgot to question him about this. There is no history of MI, ulcers, diabetes, hepatitis, yellow jaundice, malaria, kidney stones, seizure, or stroke. Past Surgical History  Procedure Date  . Knee surgery 1962  left   . Hernia repair 1960  . Tonsillectomy     as a child  . Eye surgery     bilateral cataract surgery  . Inguinal hernia repair 08/12/2011    Procedure: LAPAROSCOPIC INGUINAL HERNIA;  Surgeon: Ernestene Mention, MD;  Location: [facility] OR;  Service: General;  Laterality: Left;    Allergies: No Known Allergies  Medications: Aspirin 325 mg daily recently stopped when he went on Coumadin. Bromocriptine 5 mg one half tablet daily. Captopril 25 mg one half tablet twice a day. Fish  oil capsules 1000 mg 1 twice a day. Xalatan eyedrops for glaucoma 0.005% 1 drop both eyes at bedtime; melatonin 3 mg at bedtime when necessary sleep; multivitamins 1 daily. 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 He is on the road a lot. Married. 2 healthy children son 32 daughter 22.  reports that he quit smoking about 41 years ago. His smoking use included Cigarettes. He has a 1.5 pack-year smoking history. He has never used smokeless tobacco. He reports that he drinks alcohol. He reports that he does not use illicit drugs.  Family History: Family History  Problem Relation Age of Onset  . Pulmonary embolism Mother   . Pulmonary embolism Father   . Pulmonary embolism Maternal Grandmother   . Pulmonary embolism Maternal Grandfather   . Pulmonary embolism Paternal Grandmother   . Pulmonary embolism Paternal Grandfather     Review of Systems: Constitutional symptoms: No constitutional symptoms HEENT: No sore throat, no change in vision, Respiratory: No cough or dyspnea. At rest. He still has to stop after each lap until he catches his breath when he swims. Cardiovascular:  No chest pain or palpitations Gastrointestinal ROS: No change in bowel habit Genito-Urinary ROS: No dysuria frequency or hematuria Hematological and Lymphatic: Musculoskeletal: No muscle or bone pain Neurologic: No headache or change in vision Dermatologic: No rash or ecchymosis Remaining ROS negative.  Physical Exam: Blood pressure 133/56, pulse 88, temperature 98.3 F (36.8 C), temperature source Oral, height 6\' 1"  (1.854 m), weight 197 lb 6.4 oz (89.54 kg). Wt Readings from Last 3 Encounters:  10/12/11 197 lb 6.4 oz (89.54 kg)  09/19/11 197 lb 3.2 oz (89.449 kg)  09/01/11 197 lb (89.359 kg)    General appearance: Well-nourished Caucasian man Head: Normal Neck: Normal Lymph nodes: No cervical supraclavicular or axillary  adenopathy Breasts: Lungs: Clear to auscultation resonant to percussion Heart: Regular rhythm no murmur or gallop Abdominal: Soft nontender no mass no organomegaly GU: Not examined Extremities: No edema no calf tenderness. Measurements 37.5 cm on the left 36 on the right ankles 21 cm left 21 right Neurologic: Alert oriented, cranial nerves intact, pupils equal round reactive to light, optic disc sharp, motor strength 5 over 5, reflexes 2+ symmetric, sensation intact to vibration by tuning fork exam over the fingertips Skin: No rash or ecchymosis    Lab Results: Lab Results  Component Value Date   WBC 6.3 08/08/2011   HGB 15.4 08/08/2011   HCT 44.3 08/08/2011   MCV 95.9 08/08/2011   PLT 136* 08/08/2011     Chemistry      Component Value Date/Time   NA 141 08/08/2011 0906   K 4.2 08/08/2011 0906   CL 107 08/08/2011 0906   CO2 29 08/08/2011 0906   BUN 16 08/08/2011 0906   CREATININE 1.05 08/08/2011 0906      Component Value Date/Time   CALCIUM 9.5 08/08/2011  0906   ALKPHOS 62 08/08/2011 0906   AST 25 08/08/2011 0906   ALT 17 08/08/2011 0906   BILITOT 0.5 08/08/2011 5409       Radiological Studies: See discussion above    Impression and Plan: #1. Unprovoked pulmonary embolus occurring 13 months ago. 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 No obvious evidence for a malignant process in the right lung. Prior smoker who stopped smoking over 40 years ago.  I think that he has declared himself as someone who is at high risk for recurrent thrombotic events and therefore I would recommend long-term anticoagulation. I am going to get a hypercoagulation profile more to counsel his family than for his sake since I do feel he needs long-term anticoagulation  independent of what  lab testing shows. He has no signs or symptoms that are suspicious for an underlying neoplastic  process. However he should stay up-to-date on his routine health maintenance exams including colonoscopy.  Finding of calcified granulomas in the chest liver and spleen suggest that he may actually have underlying sarcoidosis as the possible underlying reason for hypercoagulation. It might be worthwhile to check an ACE inhibitor level  And other serologic tests that might point towards this diagnosis.  he has a mild thrombocytopenia on testing in our lab today which might also correlate with an underlying granulomatous process. He is not anemic, the thrombocytopenia is mild, and therefore I don't think that we need to put him through a bone marrow biopsy.  We talked about the availability of the 2 new oral anticoagulants Pradaxa and Xarelto. They have many advantages over Coumadin but the main disadvantage is that they are not reversible unless and until appropriate antidotes are available.  Recommendation: #1 long-term anticoagulation  #2 hypercoagulation profile which will be done through our office #3 rule out sarcoidosis. Check ACE level      Levert Feinstein, MD 10/13/2011, 11:57 AM

## 2011-10-13 NOTE — Telephone Encounter (Signed)
Talked to pt, gave him appt date for 5/6 and June 17th, date and time  for md visit , per Dr. Reece Agar

## 2011-10-17 ENCOUNTER — Other Ambulatory Visit: Payer: Medicare Other | Admitting: Lab

## 2011-10-25 ENCOUNTER — Other Ambulatory Visit: Payer: Self-pay | Admitting: Internal Medicine

## 2011-11-28 ENCOUNTER — Ambulatory Visit (HOSPITAL_BASED_OUTPATIENT_CLINIC_OR_DEPARTMENT_OTHER): Payer: Medicare Other | Admitting: Lab

## 2011-11-28 ENCOUNTER — Telehealth: Payer: Self-pay | Admitting: Oncology

## 2011-11-28 ENCOUNTER — Ambulatory Visit (HOSPITAL_BASED_OUTPATIENT_CLINIC_OR_DEPARTMENT_OTHER): Payer: Medicare Other | Admitting: Oncology

## 2011-11-28 ENCOUNTER — Encounter: Payer: Self-pay | Admitting: Oncology

## 2011-11-28 VITALS — BP 119/74 | HR 64 | Temp 97.5°F | Ht 73.0 in | Wt 198.1 lb

## 2011-11-28 DIAGNOSIS — M3130 Wegener's granulomatosis without renal involvement: Secondary | ICD-10-CM

## 2011-11-28 DIAGNOSIS — L929 Granulomatous disorder of the skin and subcutaneous tissue, unspecified: Secondary | ICD-10-CM

## 2011-11-28 DIAGNOSIS — Z7901 Long term (current) use of anticoagulants: Secondary | ICD-10-CM

## 2011-11-28 DIAGNOSIS — D689 Coagulation defect, unspecified: Secondary | ICD-10-CM

## 2011-11-28 DIAGNOSIS — I2699 Other pulmonary embolism without acute cor pulmonale: Secondary | ICD-10-CM

## 2011-11-28 LAB — CBC WITH DIFFERENTIAL/PLATELET
BASO%: 0.8 % (ref 0.0–2.0)
Basophils Absolute: 0 10*3/uL (ref 0.0–0.1)
EOS%: 6.1 % (ref 0.0–7.0)
HGB: 14.6 g/dL (ref 13.0–17.1)
MCH: 33.5 pg — ABNORMAL HIGH (ref 27.2–33.4)
MCHC: 34.2 g/dL (ref 32.0–36.0)
MCV: 97.7 fL (ref 79.3–98.0)
MONO%: 8.1 % (ref 0.0–14.0)
RDW: 13.1 % (ref 11.0–14.6)
lymph#: 1.6 10*3/uL (ref 0.9–3.3)

## 2011-11-28 NOTE — Telephone Encounter (Signed)
Gave pt appt for October 2013 lab and MD 

## 2011-11-29 NOTE — Progress Notes (Signed)
Hematology and Oncology Follow Up Visit  Jay Day [id number] 27-Jul-1940 71 y.o. 11/29/2011 9:21 PM   Principle Diagnosis: Encounter Diagnoses  Name Primary?  . Pulmonary embolus and infarction Yes  . Granulomatosis   . Coagulopathy      Interim History:  Short interim followup visit for this affable 71 year old man who recently sustained a second unprovoked right sided pulmonary embolus in April of this year off anticoagulation for just 3 months and approximately 4 weeks following a minor surgical procedure. In review of his x-ray data, he was found to have calcified granulomas in the chest, liver, and spleen. He had mild thrombocytopenia platelet count 136,000. Do to a scheduling error, he did not get a hypercoagulation profile or an ACE level prior to today's visit. I sent him back to our lab today.  Overall he is doing well. He is still not back to 100% of his exercise capacity but has come back to his regular routine which include swimming.  Medications: reviewed  Allergies: No Known Allergies  Review of Systems: Constitutional:    Respiratory: No cough or dyspnea at rest Cardiovascular:  No chest pain or palpitations Gastrointestinal: Genito-Urinary:  Musculoskeletal: Neurologic: Skin: Remaining ROS negative.  Physical Exam: Blood pressure 119/74, pulse 64, temperature 97.5 F (36.4 C), temperature source Oral, height 6\' 1"  (1.854 m), weight 198 lb 1.6 oz (89.858 kg). Wt Readings from Last 3 Encounters:  11/28/11 198 lb 1.6 oz (89.858 kg)  10/12/11 197 lb 6.4 oz (89.54 kg)  09/19/11 197 lb 3.2 oz (89.449 kg)     General appearance: Thin well-nourished Caucasian man HENNT:  Lymph nodes:  Breasts: Lungs: Clear to auscultation resonant to percussion Heart: Abdomen: Extremities: No calf swelling or tenderness Vascular: Neurologic: Skin:  Lab Results: Lab Results  Component Value Date   WBC 5.4 11/28/2011   HGB 14.6 11/28/2011   HCT 42.7 11/28/2011   MCV 97.7 11/28/2011   PLT 149 11/28/2011     Chemistry      Component Value Date/Time   NA 141 08/08/2011 0906   K 4.2 08/08/2011 0906   CL 107 08/08/2011 0906   CO2 29 08/08/2011 0906   BUN 16 08/08/2011 0906   CREATININE 1.05 08/08/2011 0906      Component Value Date/Time   CALCIUM 9.5 08/08/2011 0906   ALKPHOS 62 08/08/2011 0906   AST 25 08/08/2011 0906   ALT 17 08/08/2011 0906   BILITOT 0.5 08/08/2011 0906     ACE level borderline elevated 53 units normal 8-52; D.-dimer borderline elevated 0.55; antithrombin level 96% of control (76-126). Remainder of special hematology studies are pending.  Radiological Studies: No results found.  Impression and Plan: #1. Recurrent pulmonary emboli. He is stable on Coumadin. Plan continue indefinite anticoagulation. I will have him come back to review outstanding special lab tests. They may not be important for him but they might be important for counseling his family if we detect any abnormalities.  #2. Calcified granulomas lung liver and spleen. Borderline elevated ACE level. I suspect that he has underlying sarcoidosis. This may be a risk factor for his pulmonary embolus.   CC:. Dr. Theressa Millard; Dr. Ellamae Sia; Dr. Claud Kelp   Levert Feinstein, MD 6/18/20139:21 PM

## 2011-11-30 LAB — CARDIOLIPIN ANTIBODIES, IGG, IGM, IGA
Anticardiolipin IgG: 2 GPL U/mL (ref <23)
Anticardiolipin IgM: 11 MPL U/mL — ABNORMAL HIGH (ref <11)

## 2011-11-30 LAB — BETA-2 GLYCOPROTEIN ANTIBODIES: Beta-2 Glyco I IgG: 0 G Units (ref <20)

## 2011-11-30 LAB — ANTITHROMBIN III: AntiThromb III Func: 96 % (ref 76–126)

## 2011-11-30 LAB — LUPUS ANTICOAGULANT PANEL: PTT Lupus Anticoagulant: 36.7 secs (ref 28.0–43.0)

## 2011-11-30 LAB — PROTHROMBIN GENE MUTATION

## 2011-11-30 LAB — D-DIMER, QUANTITATIVE: D-Dimer, Quant: 0.55 ug/mL-FEU — ABNORMAL HIGH (ref 0.00–0.48)

## 2011-11-30 LAB — ANGIOTENSIN CONVERTING ENZYME: Angiotensin 1 CE: 53 U/L — ABNORMAL HIGH (ref 8–52)

## 2011-11-30 LAB — FACTOR 5 LEIDEN

## 2011-12-09 ENCOUNTER — Other Ambulatory Visit: Payer: Self-pay | Admitting: Physician Assistant

## 2012-01-04 ENCOUNTER — Other Ambulatory Visit: Payer: Self-pay | Admitting: Physician Assistant

## 2012-01-28 ENCOUNTER — Other Ambulatory Visit: Payer: Self-pay | Admitting: Physician Assistant

## 2012-02-01 ENCOUNTER — Telehealth: Payer: Self-pay | Admitting: Radiology

## 2012-02-03 NOTE — Telephone Encounter (Signed)
Med only encounter

## 2012-03-03 ENCOUNTER — Other Ambulatory Visit: Payer: Self-pay | Admitting: Physician Assistant

## 2012-03-05 ENCOUNTER — Telehealth: Payer: Self-pay | Admitting: Radiology

## 2012-03-05 NOTE — Telephone Encounter (Signed)
We got a request for pts Coumadin, when I last spoke to him Dr Cyndie Chime was controlling his Coumadin at the Coumadin clinic. His last Rx was directed to Dr Cyndie Chime, unsure why our name is still showing, I will call patient to advise.

## 2012-03-05 NOTE — Telephone Encounter (Signed)
Called pharmacy. Pharmacy will call Dr. Cyndie Chime for refills

## 2012-03-06 ENCOUNTER — Other Ambulatory Visit: Payer: Self-pay | Admitting: *Deleted

## 2012-03-06 DIAGNOSIS — I2699 Other pulmonary embolism without acute cor pulmonale: Secondary | ICD-10-CM

## 2012-03-06 MED ORDER — WARFARIN SODIUM 5 MG PO TABS: 5.0000 mg | ORAL_TABLET | Freq: Every day | ORAL | Status: DC

## 2012-03-26 ENCOUNTER — Encounter: Payer: Self-pay | Admitting: Oncology

## 2012-03-26 ENCOUNTER — Ambulatory Visit: Payer: Medicare Other | Admitting: Oncology

## 2012-03-26 ENCOUNTER — Other Ambulatory Visit: Payer: Medicare Other

## 2012-03-26 NOTE — Progress Notes (Signed)
71 year old man with history of recurrent pulmonary emboli. Most recent event in April 2013. He is on chronic Coumadin anticoagulation at this time. A hypercoagulation evaluation was unrevealing. He does have calcified granulomas in his lung, liver, and spleen and a borderline elevation of ACE level. I question whether he has underlying sarcoidosis with chronic inflammation as etiology of his clotting problems. He failed to report for his visit today. He will be rescheduled.

## 2012-04-17 ENCOUNTER — Telehealth: Payer: Self-pay | Admitting: Oncology

## 2012-04-17 NOTE — Telephone Encounter (Signed)
Pt called and r/s missed appt , appt has been r/s to January 2014 lab and MD

## 2012-04-25 ENCOUNTER — Ambulatory Visit (INDEPENDENT_AMBULATORY_CARE_PROVIDER_SITE_OTHER): Payer: Medicare Other | Admitting: Family Medicine

## 2012-04-25 VITALS — BP 124/74 | HR 59 | Temp 98.0°F | Resp 17 | Ht 72.5 in | Wt 194.0 lb

## 2012-04-25 DIAGNOSIS — R1030 Lower abdominal pain, unspecified: Secondary | ICD-10-CM

## 2012-04-25 DIAGNOSIS — R109 Unspecified abdominal pain: Secondary | ICD-10-CM

## 2012-04-25 NOTE — Progress Notes (Signed)
This 71 year old gentleman who comes in with pain in his left groin. It began 2 nights ago when he was in [place] on a trip and he been sitting in an airplane for while. At first he thought it was just an abrasion but the pain persisted. He's currently taking anticoagulants and as of 2 days ago his ProTime was 2.7.  He's had no chest pain or shortness of breath, no unusual activities, no trauma  Objective: Examination of genitalia is normal, examination for hernia is negative, there is slight tenderness in the medial inguinal area but no pain over the saphenous vein, femoral artery, or inguinal ligament. I see no swelling in the groin.  Assessment: Not sure what is causing this but I do not feel that it is a clot. Furthermore there does not appear to be any infection and the tenderness is very mild diverticulosis resolved.  Plan: Patient's to notify me if the pain has not resolved in 48 hours and is to continue his current medications

## 2012-06-15 ENCOUNTER — Other Ambulatory Visit (HOSPITAL_BASED_OUTPATIENT_CLINIC_OR_DEPARTMENT_OTHER): Payer: Medicare Other

## 2012-06-15 ENCOUNTER — Ambulatory Visit (HOSPITAL_BASED_OUTPATIENT_CLINIC_OR_DEPARTMENT_OTHER): Payer: Medicare Other | Admitting: Oncology

## 2012-06-15 ENCOUNTER — Telehealth: Payer: Self-pay | Admitting: Oncology

## 2012-06-15 VITALS — BP 129/70 | HR 57 | Temp 97.1°F | Resp 20 | Ht 72.5 in | Wt 197.2 lb

## 2012-06-15 DIAGNOSIS — I2699 Other pulmonary embolism without acute cor pulmonale: Secondary | ICD-10-CM

## 2012-06-15 DIAGNOSIS — D689 Coagulation defect, unspecified: Secondary | ICD-10-CM

## 2012-06-15 DIAGNOSIS — D353 Benign neoplasm of craniopharyngeal duct: Secondary | ICD-10-CM

## 2012-06-15 DIAGNOSIS — R222 Localized swelling, mass and lump, trunk: Secondary | ICD-10-CM

## 2012-06-15 DIAGNOSIS — D352 Benign neoplasm of pituitary gland: Secondary | ICD-10-CM

## 2012-06-15 DIAGNOSIS — R599 Enlarged lymph nodes, unspecified: Secondary | ICD-10-CM

## 2012-06-15 LAB — CBC WITH DIFFERENTIAL/PLATELET
BASO%: 0.7 % (ref 0.0–2.0)
Basophils Absolute: 0 10*3/uL (ref 0.0–0.1)
EOS%: 7.2 % — ABNORMAL HIGH (ref 0.0–7.0)
HGB: 14.8 g/dL (ref 13.0–17.1)
MCH: 33.3 pg (ref 27.2–33.4)
MONO%: 6.7 % (ref 0.0–14.0)
NEUT#: 2.9 10*3/uL (ref 1.5–6.5)
RBC: 4.45 10*6/uL (ref 4.20–5.82)
RDW: 12.3 % (ref 11.0–14.6)
lymph#: 1.9 10*3/uL (ref 0.9–3.3)

## 2012-06-15 LAB — PROTIME-INR
INR: 1.3 — ABNORMAL LOW (ref 2.00–3.50)
Protime: 15.6 Seconds — ABNORMAL HIGH (ref 10.6–13.4)

## 2012-06-15 NOTE — Telephone Encounter (Signed)
Gave pt appt for December 2014 lab and MD °

## 2012-06-15 NOTE — Progress Notes (Signed)
Hematology and Oncology Follow Up Visit  Jay Day [id number] Mar 16, 1941 72 y.o. 06/15/2012 6:02 PM   Principle Diagnosis: Encounter Diagnosis  Name Primary?  . Pulmonary embolism Yes     Interim History:   Followup visit for this 72 year old man with a history of recurrent idiopathic pulmonary emboli. Initial event occurred in March 2012. Venous Doppler study showed duration undetermined evidence of a left DVT that was clinically asymptomatic. He stayed on Coumadin through January of 2013. He had a laparoscopic inguinal hernia repair 08/12/2011. He again developed dyspnea on exertion and a repeat CT angiogram 09/19/2011 showed recurrent right pulmonary emboli which appeared to be acute. He was put back on anticoagulation. A hypercoagulation evaluation was unrevealing for any obvious risk factors. However of possible significance is the fact that he has calcified granulomas in mediastinal lymph nodes, liver, and spleen, and he has a borderline elevation of his ACE level (although he is on a ACE inhibitor for hypertension). 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 One wonders if there is a granuloma in his pituitary gland. Taken together, it is possible that he has underlying sarcoidosis. Chronic inflammation related to the sarcoid may be a risk factor for his thrombotic events.  Medications: reviewed  Allergies: No Known Allergies  Review of Systems: Constitutional:   No constitutional symptoms Respiratory: Although he still gets some minimal chest tightness, he is back to his full athletic activities including swimming laps in a pool Cardiovascular:  No chest pain or palpitations Gastrointestinal: No change in bowel habit Genito-Urinary: No urinary tract symptoms Musculoskeletal: No muscle or bone pain Neurologic: No headache or change in vision Skin: No rash Remaining ROS negative.  Physical Exam: Blood pressure 129/70, pulse 57, temperature 97.1  F (36.2 C), temperature source Oral, resp. rate 20, height 6' 0.5" (1.842 m), weight 197 lb 3.2 oz (89.449 kg). Wt Readings from Last 3 Encounters:  06/15/12 197 lb 3.2 oz (89.449 kg)  04/25/12 194 lb (87.998 kg)  11/28/11 198 lb 1.6 oz (89.858 kg)     General appearance: Thin, healthy-appearing Caucasian man HENNT: Pharynx no erythema or exudate Lymph nodes: No adenopathy Breasts: Lungs: Clear to auscultation resonant to percussion Heart: Regular rhythm no murmur Abdomen: Soft, nontender, no mass, no organomegaly Extremities: No edema, no calf tenderness Vascular: No cyanosis Neurologic: No focal deficit Skin: No rash or ecchymosis  Lab Results: Lab Results  Component Value Date   WBC 5.7 06/15/2012   HGB 14.8 06/15/2012   HCT 41.9 06/15/2012   MCV 94.2 06/15/2012   PLT 161 06/15/2012     Chemistry      Component Value Date/Time   NA 141 08/08/2011 0906   K 4.2 08/08/2011 0906   CL 107 08/08/2011 0906   CO2 29 08/08/2011 0906   BUN 16 08/08/2011 0906   CREATININE 1.05 08/08/2011 0906      Component Value Date/Time   CALCIUM 9.5 08/08/2011 0906   ALKPHOS 62 08/08/2011 0906   AST 25 08/08/2011 0906   ALT 17 08/08/2011 0906   BILITOT 0.5 08/08/2011 0906    Pro time 15.6 seconds INR 1.3 on Coumadin 5 mg daily. He admits that he ran out of the drug and just started it back 2 days ago.   Impression and Plan: #1. Recurrent idiopathic pulmonary emboli. He remains stable on chronic Coumadin anticoagulation. INR is subtherapeutic today but he missed doses of his drug. I updated him on the use of the new anticoagulants in  particular Xarelto. We now have mature data to suggest that this is acceptable treatment for acute DVT or PE. We still don't have an antidote. Since he is stable on Coumadin I recommend we continue this and he is in agreement. I'll see him again in one year. I believe by that time I will feel more comfortable switching him over to Xarelto. Development of an antidote is in  progress.  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 Calcified granulomas and mediastinal lymph nodes, liver, and spleen, borderline elevation of ACE inhibitor. Rule out sarcoidosis  #4. Essential hypertension on an ACE inhibitor. And nausea with this interferes with the ACE enzyme that we use to aid in the diagnosis of sarcoidosis.   CC:.    Levert Feinstein, MD 1/3/20146:02 PM

## 2012-06-21 ENCOUNTER — Other Ambulatory Visit: Payer: Medicare Other | Admitting: Lab

## 2012-06-25 ENCOUNTER — Emergency Department (HOSPITAL_COMMUNITY)
Admission: EM | Admit: 2012-06-25 | Discharge: 2012-06-26 | Disposition: A | Discharge status: Home / Self Care | Payer: Medicare Other | Attending: Emergency Medicine | Admitting: Emergency Medicine

## 2012-06-25 ENCOUNTER — Encounter (HOSPITAL_COMMUNITY): Payer: Self-pay | Admitting: *Deleted

## 2012-06-25 DIAGNOSIS — Z86711 Personal history of pulmonary embolism: Secondary | ICD-10-CM | POA: Insufficient documentation

## 2012-06-25 DIAGNOSIS — I10 Essential (primary) hypertension: Secondary | ICD-10-CM | POA: Insufficient documentation

## 2012-06-25 DIAGNOSIS — Z7901 Long term (current) use of anticoagulants: Secondary | ICD-10-CM | POA: Insufficient documentation

## 2012-06-25 DIAGNOSIS — Z8669 Personal history of other diseases of the nervous system and sense organs: Secondary | ICD-10-CM | POA: Insufficient documentation

## 2012-06-25 DIAGNOSIS — Z872 Personal history of diseases of the skin and subcutaneous tissue: Secondary | ICD-10-CM | POA: Insufficient documentation

## 2012-06-25 DIAGNOSIS — R0789 Other chest pain: Secondary | ICD-10-CM | POA: Insufficient documentation

## 2012-06-25 DIAGNOSIS — H409 Unspecified glaucoma: Secondary | ICD-10-CM | POA: Insufficient documentation

## 2012-06-25 DIAGNOSIS — Z87891 Personal history of nicotine dependence: Secondary | ICD-10-CM | POA: Insufficient documentation

## 2012-06-25 DIAGNOSIS — J45909 Unspecified asthma, uncomplicated: Secondary | ICD-10-CM | POA: Insufficient documentation

## 2012-06-25 DIAGNOSIS — Z862 Personal history of diseases of the blood and blood-forming organs and certain disorders involving the immune mechanism: Secondary | ICD-10-CM | POA: Insufficient documentation

## 2012-06-25 DIAGNOSIS — Z8639 Personal history of other endocrine, nutritional and metabolic disease: Secondary | ICD-10-CM | POA: Insufficient documentation

## 2012-06-25 DIAGNOSIS — R079 Chest pain, unspecified: Secondary | ICD-10-CM

## 2012-06-25 DIAGNOSIS — Z86718 Personal history of other venous thrombosis and embolism: Secondary | ICD-10-CM | POA: Insufficient documentation

## 2012-06-25 DIAGNOSIS — J189 Pneumonia, unspecified organism: Secondary | ICD-10-CM | POA: Insufficient documentation

## 2012-06-25 LAB — POCT I-STAT TROPONIN I: Troponin i, poc: 0 ng/mL (ref 0.00–0.08)

## 2012-06-25 LAB — PROTIME-INR
INR: 1.66 — ABNORMAL HIGH (ref 0.00–1.49)
Prothrombin Time: 19.1 seconds — ABNORMAL HIGH (ref 11.6–15.2)

## 2012-06-25 NOTE — ED Provider Notes (Signed)
History     CSN: [id number]  Arrival date & time 06/25/12  2122   First MD Initiated Contact with Patient 06/25/12 2153      Chief Complaint  Patient presents with  . Chest Pain    (Consider location/radiation/quality/duration/timing/severity/associated sxs/prior treatment) Patient is a 72 y.o. male presenting with chest pain. The history is provided by the patient. No language interpreter was used.  Chest Pain The chest pain began 3 - 5 hours ago. Chest pain occurs constantly. The chest pain is unchanged. The pain is associated with breathing. The severity of the pain is mild. The quality of the pain is described as pleuritic. The pain does not radiate. Chest pain is worsened by deep breathing. Pertinent negatives for primary symptoms include no fever, no shortness of breath, no cough, no abdominal pain, no nausea, no vomiting and no dizziness. He tried nothing for the symptoms.  His past medical history is significant for PE.     Past Medical History  Diagnosis Date  . Pulmonary embolism   . Clotting disorder   . Asthma     hx of as child  . Bronchitis     as a child  . Pituitary tumor     takes medication to manage  . Hypertension     sees Dr. Earl Gala, primary   . Elevated PSA     being monitored by physician  . Cataract   . 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 Glaucoma(365) 10/13/2011  . 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 Knee surgery 1962    left   . Hernia repair 1960  . Tonsillectomy     as a child  . Eye surgery     bilateral cataract surgery  . Inguinal hernia repair 08/12/2011    Procedure: LAPAROSCOPIC INGUINAL HERNIA;  Surgeon: Ernestene Mention, MD;  Location: [facility] OR;  Service: General;  Laterality: Left;     Family History  Problem Relation Age of Onset  . Pulmonary embolism Mother   . Pulmonary embolism Father   . Pulmonary embolism Maternal Grandmother   . Pulmonary embolism Maternal Grandfather   . Pulmonary embolism Paternal Grandmother   . Pulmonary embolism Paternal Grandfather     History  Substance Use Topics  . Smoking status: Former Smoker -- 0.3 packs/day for 5 years    Types: Cigarettes    Quit date: 06/13/1970  . Smokeless tobacco: Never Used  . Alcohol Use: Yes     Comment: very little      Review of Systems  Constitutional: Negative for fever and chills.  HENT: Negative for congestion and sore throat.   Respiratory: Negative for cough and shortness of breath.   Cardiovascular: Positive for chest pain. Negative for leg swelling.  Gastrointestinal: Negative for nausea, vomiting, abdominal pain, diarrhea and constipation.  Genitourinary: Negative for dysuria and frequency.  Skin: Negative for color change and rash.  Neurological: Negative for dizziness and headaches.  Psychiatric/Behavioral: Negative for confusion and agitation.  All other systems reviewed and are negative.    Allergies  Review of patient's allergies indicates no known allergies.  Home Medications   Current Outpatient Rx  Name  Route  Sig  Dispense  Refill  . BROMOCRIPTINE MESYLATE  5 MG PO CAPS   Oral   Take 5 mg by mouth daily.          Marland Kitchen CAPTOPRIL 25 MG PO TABS   Oral   Take 12.5 mg by mouth 2 (two) times daily.          . OMEGA-3 FATTY ACIDS 1000 MG PO CAPS   Oral   Take 2 g by mouth 2 (two) times daily. For itching          . LATANOPROST 0.005 % OP SOLN   Both Eyes   Place 1 drop into both eyes At bedtime.         Marland Kitchen MELATONIN 3 MG PO TABS   Oral   Take 3 mg by mouth at bedtime as needed. For sleep          . ADULT MULTIVITAMIN W/MINERALS CH   Oral   Take 1 tablet by mouth daily.         . TOLNAFTATE 1 % EX AERP   Topical   Apply 1 application  topically 2 (two) times daily.         . WARFARIN SODIUM 5 MG PO TABS   Oral   Take 1 tablet (5 mg total) by mouth daily.   30 tablet   0     BP 133/89  Pulse 80  Temp 98.5 F (36.9 C) (Oral)  Resp 16  SpO2 98%  Physical Exam  Constitutional: He is oriented to person, place, and time. He appears well-developed and well-nourished. No distress.  HENT:  Head: Normocephalic and atraumatic.  Eyes: EOM are normal. Pupils are equal, round, and reactive to light.  Neck: Normal range of motion. Neck supple.  Cardiovascular: Normal rate and regular rhythm.   Pulmonary/Chest: Effort normal. No respiratory distress.  Abdominal: Soft. He exhibits no distension.  Musculoskeletal: Normal range of motion. He exhibits no edema.  Neurological: He is alert and oriented to person, place, and time.  Skin: Skin is warm and dry.  Psychiatric: He has a normal mood and affect. His behavior is normal.    ED Course  Procedures (including critical care time)   Labs Reviewed  POCT I-STAT TROPONIN I  CBC  PROTIME-INR   No results found.  Date: 06/25/2012  Rate: 78   Rhythm: normal sinus rhythm  QRS Axis: indeterminate  Intervals: normal  ST/T Wave abnormalities: normal  Conduction Disutrbances:none  Narrative Interpretation: PVC  Old EKG Reviewed: none available Results for orders placed during the hospital encounter of 06/25/12  CBC      Component Value Range   WBC 6.6  4.0 - 10.5 K/uL   RBC 4.37  4.22 - 5.81 MIL/uL   Hemoglobin 15.0  13.0 - 17.0 g/dL   HCT 28.4  13.2 - 44.0 %   MCV 96.1  78.0 - 100.0 fL   MCH 34.3 (*) 26.0 - 34.0 pg   MCHC 35.7  30.0 - 36.0 g/dL   RDW 10.2  72.5 - 36.6 %   Platelets 155  150 - 400 K/uL  PROTIME-INR      Component Value Range   Prothrombin Time 19.1 (*) 11.6 - 15.2 seconds   INR 1.66 (*) 0.00 - 1.49  POCT I-STAT TROPONIN I      Component Value Range   Troponin i, poc 0.00  0.00 - 0.08 ng/mL   Comment 3           POCT I-STAT, CHEM 8  Component Value Range   Sodium 143  135 - 145 mEq/L   Potassium 3.5  3.5 - 5.1 mEq/L   Chloride 107  96 - 112 mEq/L   BUN 21  6 - 23 mg/dL   Creatinine, Ser 1.61  0.50 - 1.35 mg/dL   Glucose, Bld 096 (*) 70 - 99 mg/dL   Calcium, Ion 0.45  4.09 - 1.30 mmol/L   TCO2 24  0 - 100 mmol/L   Hemoglobin 13.6  13.0 - 17.0 g/dL   HCT 81.1  91.4 - 78.2 %   CT Angio Chest W/Cm &/Or Wo Cm (Final result)   Result time:06/26/12 0109    Final result by Rad Results In Interface (06/26/12 01:09:30)    Narrative:   *RADIOLOGY REPORT*  Clinical Data: Right chest pain. History of pulmonary emboli.  CT ANGIOGRAPHY CHEST  Technique: Multidetector CT imaging of the chest using the standard protocol during bolus administration of intravenous contrast. Multiplanar reconstructed images including MIPs were obtained and reviewed to evaluate the vascular anatomy.  Contrast: OMNIPAQUE IOHEXOL 350 MG/ML SOLN  Comparison: 09/19/2011  Findings: There is good contrast opacification of the pulmonary artery branches. No discrete filling defect to suggest acute PE.Interval resolution of the right pulmonary emboli seen previously. There is limited opacification of the thoracic aorta. No aneurysm. Patchy aortic and coronary calcifications. No hilar or mediastinal adenopathy. No pleural or pericardial effusion. There is a pleural-based airspace infiltrate peripherally in the right middle lobe. There is some dependent atelectasis posteriorly in both lung bases. Calcified right hilar, subcarinal, and para esophageal lymph nodes. Multiple small calcified granulomas in the visualized portions of liver and spleen. Probable large right renal cyst is again noted, incompletely visualized. Thoracic spine and sternum unremarkable.  IMPRESSION: 1. Negative for acute PE. 2. Focal right middle lobe airspace infiltrate suggesting pneumonia. 3. Atherosclerosis, including . coronary artery disease. Please note that  although the presence of coronary artery calcium documents the presence of coronary artery disease, the severity of this disease and any potential stenosis cannot be assessed on this non-gated CT examination. Assessment for potential risk factor modification, dietary therapy or pharmacologic therapy may be warranted, if clinically indicated.   Original Report Authenticated By: D. Andria Rhein, MD            No diagnosis found.    MDM  Pt w/ PMHx of DVT/PE on coumadin w/ recent therapeutic INR now w/ acute onset sharp pleuritic right sided chest pain. Was swimming today and developed pain soon after exercise. Initially ttp right chest wall. Chest wall pain resolved however pleuritic pain continued. Denies dyspnea, nausea or diaphoresis. No Hx of CAD. No recent long travel/surgery or immobilization. No hemoptysis.   Exam: pulse 65, no resp distress or hypoxia. Chest wall nttp. Lungs ctab.  DDx/Plan: likely chest wall pain, intercostal strain. W/ pts hx will rule out PE. ECG - NSR w/out acute ischemia. Will check troponin, CTA chest, bmp, and troponin. Based on hx and exam doubt ACS, spont ptx, boerhaave, tamponade or dissection.   Course: pt reassessed, vitals stable, NAD, CTA reveals RML pneumonia. CAP - not hypoxic or febrile. Denies cough. ECG - no acute ischemia. Troponin neg, no leukocytosis. INR 1.66. At this time no indication for inpt care. Given 1st dose of levoquin in ED. Given rx to complete 5 day course. Stable for d/c home. Given return precautions and follow up instructions. D/c home in good condition.   1. Community acquired pneumonia   2. Chest  pain    New Prescriptions   HYDROCODONE-ACETAMINOPHEN (NORCO) 5-325 MG PER TABLET    Take 1 tablet by mouth every 4 (four) hours as needed for pain.   LEVOFLOXACIN (LEVAQUIN) 750 MG TABLET    Take 1 tablet (750 mg total) by mouth daily. X 4 days   Darnelle Bos, MD [address], [facility] AND  [place] [address] [phone]  Schedule an appointment as soon as possible for a visit on 07/02/2012          Audelia Hives, MD 06/26/12 [phone]

## 2012-06-25 NOTE — ED Notes (Signed)
Onset of rgt lateral chest wall pain x1 week - noticed more today after swimming; states able to ambulate without becoming dyspneic; resting quietly on stretcher with wife at bedside; able to speak in complete sentences; ccm showing sinus rhythm with frequent PJCs; denies any c/o cp, n/v nor other symptoms at present

## 2012-06-25 NOTE — ED Notes (Signed)
Pt c/o right sided CP that states he began having right sided CP after swimming today.  Pt has hx of PE.  Also c/o SOB.

## 2012-06-26 ENCOUNTER — Emergency Department (HOSPITAL_COMMUNITY): Payer: Medicare Other

## 2012-06-26 ENCOUNTER — Encounter (HOSPITAL_COMMUNITY): Payer: Self-pay | Admitting: Radiology

## 2012-06-26 LAB — CBC
Hemoglobin: 15 g/dL (ref 13.0–17.0)
MCH: 34.3 pg — ABNORMAL HIGH (ref 26.0–34.0)
Platelets: 155 10*3/uL (ref 150–400)
RBC: 4.37 MIL/uL (ref 4.22–5.81)
WBC: 6.6 10*3/uL (ref 4.0–10.5)

## 2012-06-26 LAB — POCT I-STAT, CHEM 8
Calcium, Ion: 1.22 mmol/L (ref 1.13–1.30)
HCT: 40 % (ref 39.0–52.0)
Hemoglobin: 13.6 g/dL (ref 13.0–17.0)
Sodium: 143 mEq/L (ref 135–145)
TCO2: 24 mmol/L (ref 0–100)

## 2012-06-26 MED ORDER — LEVOFLOXACIN 750 MG PO TABS
750.0000 mg | ORAL_TABLET | Freq: Once | ORAL | Status: AC
  Administered 2012-06-26: 750 mg via ORAL
  Filled 2012-06-26: qty 1

## 2012-06-26 MED ORDER — HYDROCODONE-ACETAMINOPHEN 5-325 MG PO TABS: 1.0000 | ORAL_TABLET | ORAL | Status: DC | PRN

## 2012-06-26 MED ORDER — IOHEXOL 350 MG/ML SOLN
100.0000 mL | Freq: Once | INTRAVENOUS | Status: AC | PRN
  Administered 2012-06-26: 100 mL via INTRAVENOUS

## 2012-06-26 MED ORDER — LEVOFLOXACIN 750 MG PO TABS: 750.0000 mg | ORAL_TABLET | Freq: Every day | ORAL | Status: DC

## 2012-06-26 NOTE — ED Notes (Signed)
Rx x 2.  Pt voiced understanding to f/u with PCP and return for worsening condition.  

## 2012-06-26 NOTE — ED Notes (Signed)
Patient transported to CT 

## 2012-06-26 NOTE — ED Notes (Signed)
Returned from CT. Pt laughing and talking with wife

## 2012-06-27 NOTE — ED Provider Notes (Signed)
Medical screening examination/treatment/procedure(s) were conducted as a shared visit with non-physician practitioner(s) and myself.  I personally evaluated the patient during the encounter.  Patient seen in conjunction with Dr. Gary Fleet. Patient had sharp chest pain, not concerning for cardiac etiology. PE was considered, but workup revealed diagnosis of community acquired pneumonia. Patient discharged with treatment, followup with primary doctor.    Gilda Crease, MD 06/27/12 0001

## 2013-01-01 ENCOUNTER — Ambulatory Visit (INDEPENDENT_AMBULATORY_CARE_PROVIDER_SITE_OTHER): Payer: BC Managed Care – PPO | Admitting: Internal Medicine

## 2013-01-01 DIAGNOSIS — H6980 Other specified disorders of Eustachian tube, unspecified ear: Secondary | ICD-10-CM

## 2013-01-01 DIAGNOSIS — H6983 Other specified disorders of Eustachian tube, bilateral: Secondary | ICD-10-CM

## 2013-01-01 DIAGNOSIS — H6690 Otitis media, unspecified, unspecified ear: Secondary | ICD-10-CM

## 2013-01-01 MED ORDER — AMOXICILLIN 500 MG PO CAPS: 1000.0000 mg | ORAL_CAPSULE | Freq: Two times a day (BID) | ORAL | Status: DC

## 2013-01-01 NOTE — Patient Instructions (Signed)

## 2013-01-03 NOTE — Progress Notes (Signed)
  Subjective:    Patient ID: Jay Day, male    DOB: 03-20-1941, 72 y.o.   MRN: [id number]  HPI Has st and ear ache. No chest sxs. Left ear stopped up and painful.   Review of Systems cardiac    Objective:   Physical Exam Left aom       Assessment & Plan:  Take meds as ordered

## 2013-05-05 ENCOUNTER — Ambulatory Visit (INDEPENDENT_AMBULATORY_CARE_PROVIDER_SITE_OTHER): Payer: Medicare Other | Admitting: Family Medicine

## 2013-05-05 VITALS — BP 124/80 | HR 57 | Temp 97.1°F | Resp 16 | Ht 72.5 in | Wt 199.0 lb

## 2013-05-05 DIAGNOSIS — K4090 Unilateral inguinal hernia, without obstruction or gangrene, not specified as recurrent: Secondary | ICD-10-CM

## 2013-05-05 NOTE — Progress Notes (Signed)
[facility]:  Office Visit  Chief Complaint:  Chief Complaint  Patient presents with  . Inguinal Hernia    ? Lt sided pain- had operation about a year ago for repair    HPI Jay Day is a 72 y.o. male who is here for  Recheck of his left inguinal hernia repair He feels it has been different for the last 2 months, doe snot matter what he does, it does not feel like the other side or after he had repair whether he is in laying down, squatting position or anything that involved increase abd pressure. He jsut notice dit 2 months ago, repairs was done about 1 year ago. He had ahd bilateral inguinal hernia repairs.  No fevers, chills, nausea, vomiting, abd pain . "just feel different", not even pain.  He deneis any urianry sxs ie increase urinary frequency or dribbling or nighttime urination , so will defer prostate exam  Past Medical History  Diagnosis Date  . Pulmonary embolism   . Clotting disorder   . Asthma     hx of as child  . Bronchitis     as a child  . Pituitary tumor     takes medication to manage  . Hypertension     sees Dr. Earl Gala, primary   . Elevated PSA     being monitored by physician  . Cataract   . 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 Glaucoma 10/13/2011  . 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 Knee surgery  1962    left   . Hernia repair  1960  . Tonsillectomy      as a child  . Eye surgery      bilateral cataract surgery  . Inguinal hernia repair  08/12/2011    Procedure: LAPAROSCOPIC INGUINAL HERNIA;  Surgeon: Ernestene Mention, MD;  Location: [facility] OR;  Service: General;  Laterality: Left;   History   Social History  . Marital Status: Married    Spouse Name: N/A    Number of  Children: 2  . Years of Education: N/A   Occupational History  . [employer]    Social History Main Topics  . Smoking status: Former Smoker -- 0.30 packs/day for 5 years    Types: Cigarettes    Quit date: 06/13/1970  . Smokeless tobacco: Never Used  . Alcohol Use: Yes     Comment: very little  . Drug Use: No  . Sexual Activity: Yes    Birth Control/ Protection: None   Other Topics Concern  . None   Social History Narrative  . None   Family History  Problem Relation Age of Onset  . Pulmonary embolism Mother   . Pulmonary embolism Father   . Pulmonary embolism Maternal Grandmother   . Pulmonary embolism Maternal Grandfather   . Pulmonary embolism Paternal Grandmother   . Pulmonary embolism Paternal Grandfather    No Known Allergies Prior to Admission medications   Medication Sig Start Date End Date Taking? Authorizing Provider  bromocriptine (PARLODEL) 5 MG capsule Take 5 mg by mouth daily.    Yes Historical Provider, MD  captopril (CAPOTEN) 25 MG tablet Take 12.5 mg by mouth 2 (two) times daily.  Yes Historical Provider, MD  fish oil-omega-3 fatty acids 1000 MG capsule Take 2 g by mouth 2 (two) times daily. For itching    Yes Historical Provider, MD  HYDROcodone-acetaminophen (NORCO) 5-325 MG per tablet Take 1 tablet by mouth every 4 (four) hours as needed for pain. 06/26/12  Yes Audelia Hives, MD  latanoprost (XALATAN) 0.005 % ophthalmic solution Place 1 drop into both eyes At bedtime. 09/14/10  Yes Historical Provider, MD  Melatonin 3 MG TABS Take 3 mg by mouth at bedtime as needed. For sleep    Yes Historical Provider, MD  Multiple Vitamin (MULITIVITAMIN WITH MINERALS) TABS Take 1 tablet by mouth daily.   Yes Historical Provider, MD  tolnaftate (TINACTIN) 1 % spray Apply 1 application topically 2 (two) times daily.   Yes Historical Provider, MD  warfarin (COUMADIN) 5 MG tablet Take 1 tablet (5 mg total) by mouth daily. 03/06/12  Yes Levert Feinstein, MD      ROS: The patient denies fevers, chills, night sweats, unintentional weight loss, chest pain, palpitations, wheezing, dyspnea on exertion, nausea, vomiting, abdominal pain, dysuria, hematuria, melena, numbness, weakness, or tingling.   All other systems have been reviewed and were otherwise negative with the exception of those mentioned in the HPI and as above.    PHYSICAL EXAM: Filed Vitals:   05/05/13 1322  BP: 124/80  Pulse: 57  Temp: 97.1 F (36.2 C)  Resp: 16   Filed Vitals:   05/05/13 1322  Height: 6' 0.5" (1.842 m)  Weight: 199 lb (90.266 kg)   Body mass index is 26.6 kg/(m^2).  General: Alert, no acute distress HEENT:  Normocephalic, atraumatic, oropharynx patent. EOMI, PERRLA Cardiovascular:  Regular rate and rhythm, no rubs murmurs or gallops.  No Carotid bruits, radial pulse intact. No pedal edema.  Respiratory: Clear to auscultation bilaterally.  No wheezes, rales, or rhonchi.  No cyanosis, no use of accessory musculature GI: No organomegaly, abdomen is soft and non-tender, positive bowel sounds.  No masses. Skin: No rashes. Neurologic: Facial musculature symmetric. Psychiatric: Patient is appropriate throughout our interaction. Lymphatic: No cervical lymphadenopathy Musculoskeletal: Gait intact. Bilateral inguinal repairs intact No abd or ventral hernias.   LABS: Results for orders placed during the hospital encounter of 06/25/12  CBC      Result Value Range   WBC 6.6  4.0 - 10.5 K/uL   RBC 4.37  4.22 - 5.81 MIL/uL   Hemoglobin 15.0  13.0 - 17.0 g/dL   HCT 16.1  09.6 - 04.5 %   MCV 96.1  78.0 - 100.0 fL   MCH 34.3 (*) 26.0 - 34.0 pg   MCHC 35.7  30.0 - 36.0 g/dL   RDW 40.9  81.1 - 91.4 %   Platelets 155  150 - 400 K/uL  PROTIME-INR      Result Value Range   Prothrombin Time 19.1 (*) 11.6 - 15.2 seconds   INR 1.66 (*) 0.00 - 1.49  POCT I-STAT TROPONIN I      Result Value Range   Troponin i, poc 0.00  0.00 - 0.08 ng/mL   Comment 3            POCT I-STAT, CHEM 8      Result Value Range   Sodium 143  135 - 145 mEq/L   Potassium 3.5  3.5 - 5.1 mEq/L   Chloride 107  96 - 112 mEq/L   BUN 21  6 - 23 mg/dL   Creatinine, Ser 7.82  0.50 - 1.35  mg/dL   Glucose, Bld 119 (*) 70 - 99 mg/dL   Calcium, Ion 1.47  8.29 - 1.30 mmol/L   TCO2 24  0 - 100 mmol/L   Hemoglobin 13.6  13.0 - 17.0 g/dL   HCT 56.2  13.0 - 86.5 %     EKG/XRAY:   Primary read interpreted by [facility]. Conley Rolls at [facility].   ASSESSMENT/PLAN: Encounter Diagnosis  Name Primary?  . Inguinal hernia Yes   Recheck on inguinal repair both left and right, Everything seems intact to me I made him cough in supine position and standing and did a thorough hernia check, appears intact, squat and increase abd pressure to replicate feeling but unable to do it.  Monitor for worsening sxs, will recheck and do advance imaging studies if needed ? Etiology, he does have a h/o idiopathic PE  not likely source of inguinal issues F/u prn Gross sideeffects, risk and benefits, and alternatives of medications d/w patient. Patient is aware that all medications have potential sideeffects and we are unable to predict every sideeffect or drug-drug interaction that may occur.  Bryar Dahms PHUONG, DO 05/12/2013 2:18 PM

## 2013-06-11 ENCOUNTER — Encounter: Payer: Self-pay | Admitting: Oncology

## 2013-06-11 ENCOUNTER — Telehealth: Payer: Self-pay | Admitting: *Deleted

## 2013-06-11 ENCOUNTER — Other Ambulatory Visit (HOSPITAL_BASED_OUTPATIENT_CLINIC_OR_DEPARTMENT_OTHER): Payer: Medicare Other

## 2013-06-11 ENCOUNTER — Ambulatory Visit (HOSPITAL_BASED_OUTPATIENT_CLINIC_OR_DEPARTMENT_OTHER): Payer: Medicare Other | Admitting: Oncology

## 2013-06-11 VITALS — BP 148/79 | HR 61 | Temp 97.8°F | Resp 18 | Ht 72.0 in | Wt 192.0 lb

## 2013-06-11 DIAGNOSIS — I825Z2 Chronic embolism and thrombosis of unspecified deep veins of left distal lower extremity: Secondary | ICD-10-CM

## 2013-06-11 DIAGNOSIS — Z86711 Personal history of pulmonary embolism: Secondary | ICD-10-CM

## 2013-06-11 DIAGNOSIS — I2699 Other pulmonary embolism without acute cor pulmonale: Secondary | ICD-10-CM

## 2013-06-11 DIAGNOSIS — Z7901 Long term (current) use of anticoagulants: Secondary | ICD-10-CM | POA: Insufficient documentation

## 2013-06-11 DIAGNOSIS — R599 Enlarged lymph nodes, unspecified: Secondary | ICD-10-CM

## 2013-06-11 HISTORY — DX: Long term (current) use of anticoagulants: Z79.01

## 2013-06-11 LAB — CBC WITH DIFFERENTIAL/PLATELET
BASO%: 0.5 % (ref 0.0–2.0)
Eosinophils Absolute: 0.3 10*3/uL (ref 0.0–0.5)
LYMPH%: 20.3 % (ref 14.0–49.0)
MCHC: 35 g/dL (ref 32.0–36.0)
MONO#: 0.5 10*3/uL (ref 0.1–0.9)
MONO%: 8.4 % (ref 0.0–14.0)
NEUT#: 4 10*3/uL (ref 1.5–6.5)
Platelets: 154 10*3/uL (ref 140–400)
RBC: 4.4 10*6/uL (ref 4.20–5.82)
RDW: 12.2 % (ref 11.0–14.6)
WBC: 6.1 10*3/uL (ref 4.0–10.3)

## 2013-06-11 LAB — PROTIME-INR
INR: 2.2 (ref 2.00–3.50)
Protime: 26.4 Seconds — ABNORMAL HIGH (ref 10.6–13.4)

## 2013-06-11 NOTE — Telephone Encounter (Signed)
Pt is aware that someone will call with an appt ...td

## 2013-06-11 NOTE — Progress Notes (Signed)
Hematology and Oncology Follow Up Visit  Jay Day [id number] 23-Sep-1940 72 y.o. 06/11/2013 2:44 PM   Principle Diagnosis:No diagnosis found.   Interim History:    Followup visit for this 72 year old man with a history of recurrent idiopathic pulmonary emboli. Initial event occurred in March 2012. Venous Doppler study showed duration undetermined evidence of a left DVT that was clinically asymptomatic. He stayed on Coumadin through January of 2013. He had a laparoscopic inguinal hernia repair 08/12/2011. He again developed dyspnea on exertion and a repeat CT angiogram 09/19/2011 showed recurrent right pulmonary emboli which appeared to be acute. He was put back on anticoagulation.  A hypercoagulation evaluation was unrevealing for any obvious risk factors. However of possible significance is the fact that he has calcified granulomas in mediastinal lymph nodes, liver, and spleen, and he has a borderline elevation of his ACE level (although he is on a ACE inhibitor for hypertension). 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 One wonders if there is a granuloma in his pituitary gland? Taken together, it is possible that he has underlying sarcoidosis. Chronic inflammation related to the sarcoid may be a risk factor for his thrombotic events. He has been very stable on Coumadin anticoagulation. We visit possibility of changing to one of the new oral anticoagulant every time that he sees me. On the positive side, we are not seeing an increase in bleeding complications or strokes. More and more people feel comfortable using them. We still don't have a reversal agent a reversal agent saw in late stages of development and should be available within the next year or 2. Right now, he remains comfortable on the Coumadin and will continue this drug.  He started getting information about a gluten-free diet he had a consultation with  A Dr. Lelon Perla locally. He elected to go on a  gluten-free diet and within a short period of time notices a significant improvement in his energy level and in his short-term memory.   Medications: reviewed  Allergies: No Known Allergies  Review of Systems: Hematology: No bleeding or bruising ENT ROS: No sore throat Breast ROS: Respiratory ROS: No cough or dyspnea  Cardiovascular ROS:  No chest pain or palpitation Gastrointestinal ROS:  No abdominal pain or change in bowel Genito-Urinary ROS no urinary tract symptoms Musculoskeletal ROS: No muscle bone or joint pain Neurological ROS: No headache or change in vision Remaining ROS negative.  Physical Exam: Blood pressure 148/79, pulse 61, temperature 97.8 F (36.6 C), temperature source Oral, resp. rate 18, height 6' (1.829 m), weight 192 lb (87.091 kg), SpO2 99.00%. Wt Readings from Last 3 Encounters:  06/11/13 192 lb (87.091 kg)  05/05/13 199 lb (90.266 kg)  01/01/13 191 lb (86.637 kg)     General appearance: Well-nourished Caucasian man HENNT: Pharynx no erythema, exudate, mass, or ulcer. No thyromegaly or thyroid nodules Lymph nodes: No cervical, supraclavicular, or axillary lymphadenopathy Breasts:  Lungs: Clear to auscultation, resonant to percussion throughout Heart: Regular rhythm, no murmur, no gallop, no rub, no click, no edema Abdomen: Soft, nontender, normal bowel sounds, no mass, no organomegaly Extremities: No edema, no calf tenderness Musculoskeletal: no joint deformities GU:  Vascular: Carotid pulses 2+, no bruits,  Neurologic: Alert, oriented, PERRLA, , cranial nerves grossly normal, motor strength 5 over 5, reflexes 1+ symmetric, upper body coordination normal, gait normal, Skin: No rash or ecchymosis  Lab Results: CBC W/Diff    Component Value Date/Time   WBC 6.1 06/11/2013 1338   WBC  6.6 06/25/2012 2154   RBC 4.40 06/11/2013 1338   RBC 4.37 06/25/2012 2154   HGB 14.6 06/11/2013 1338   HGB 13.6 06/26/2012 0005   HCT 41.7 06/11/2013 1338   HCT 40.0  06/26/2012 0005   PLT 154 06/11/2013 1338   PLT 155 06/25/2012 2154   MCV 94.8 06/11/2013 1338   MCV 96.1 06/25/2012 2154   MCH 33.2 06/11/2013 1338   MCH 34.3* 06/25/2012 2154   MCHC 35.0 06/11/2013 1338   MCHC 35.7 06/25/2012 2154   RDW 12.2 06/11/2013 1338   RDW 12.3 06/25/2012 2154   LYMPHSABS 1.2 06/11/2013 1338   LYMPHSABS 1.1 08/08/2011 0906   MONOABS 0.5 06/11/2013 1338   MONOABS 0.9 08/08/2011 0906   EOSABS 0.3 06/11/2013 1338   EOSABS 0.3 08/08/2011 0906   BASOSABS 0.0 06/11/2013 1338   BASOSABS 0.0 08/08/2011 0906     Chemistry      Component Value Date/Time   NA 143 06/26/2012 0005   K 3.5 06/26/2012 0005   CL 107 06/26/2012 0005   CO2 29 08/08/2011 0906   BUN 21 06/26/2012 0005   CREATININE 0.90 06/26/2012 0005      Component Value Date/Time   CALCIUM 9.5 08/08/2011 0906   ALKPHOS 62 08/08/2011 0906   AST 25 08/08/2011 0906   ALT 17 08/08/2011 0906   BILITOT 0.5 08/08/2011 1610       Radiological Studies: No results found.  Impression:  #1. 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 Calcified granulomas and mediastinal lymph nodes, liver, and spleen, borderline elevation of ACE inhibitor. Rule out sarcoidosis   #4. Essential hypertension on an ACE inhibitor. And nausea with this interferes with the ACE enzyme that we use to aid in the diagnosis of sarcoidosis   CC: Patient Care Team: Darnelle Bos, MD as PCP - General (Internal Medicine)   Levert Feinstein, MD 12/30/20142:44 PM

## 2013-08-12 ENCOUNTER — Encounter: Payer: Self-pay | Admitting: Oncology

## 2014-03-09 ENCOUNTER — Encounter: Payer: Self-pay | Admitting: *Deleted

## 2014-03-12 ENCOUNTER — Ambulatory Visit: Payer: Medicare Other | Admitting: Cardiology

## 2014-03-17 ENCOUNTER — Encounter: Payer: Self-pay | Admitting: Cardiology

## 2014-03-17 ENCOUNTER — Ambulatory Visit (INDEPENDENT_AMBULATORY_CARE_PROVIDER_SITE_OTHER): Payer: Medicare Other | Admitting: Cardiology

## 2014-03-17 VITALS — BP 126/84 | HR 55 | Ht 73.0 in | Wt 187.0 lb

## 2014-03-17 DIAGNOSIS — I700 Atherosclerosis of aorta: Secondary | ICD-10-CM | POA: Insufficient documentation

## 2014-03-17 DIAGNOSIS — R0789 Other chest pain: Secondary | ICD-10-CM

## 2014-03-17 DIAGNOSIS — I10 Essential (primary) hypertension: Secondary | ICD-10-CM

## 2014-03-17 DIAGNOSIS — Z86711 Personal history of pulmonary embolism: Secondary | ICD-10-CM | POA: Insufficient documentation

## 2014-03-17 DIAGNOSIS — D352 Benign neoplasm of pituitary gland: Secondary | ICD-10-CM

## 2014-03-17 DIAGNOSIS — I2510 Atherosclerotic heart disease of native coronary artery without angina pectoris: Secondary | ICD-10-CM | POA: Insufficient documentation

## 2014-03-17 DIAGNOSIS — Z7901 Long term (current) use of anticoagulants: Secondary | ICD-10-CM

## 2014-03-17 DIAGNOSIS — I2584 Coronary atherosclerosis due to calcified coronary lesion: Secondary | ICD-10-CM

## 2014-03-17 NOTE — Progress Notes (Signed)
[place]. [address], [address] [place], [place]  [address] Phone: [phone] Fax:  [phone]  Date:  03/17/2014   ID:  SEGUNDO MAKELA, DOB 06/29/1940, MRN [id number]  PCP:  Horton Finer, MD   History of Present Illness: GALE HULSE is a 73 y.o. male here for evaluation of chest pain. Has a past medical history of hypertension, pituitary adenoma followed by Dr. [name], pulmonary embolism in 2012 treated with Coumadin with recurrent PE in 2013, Coumadin restarted. Dr. [name] evaluation negative for her it'll cost. LDL 107, HDL 52, creatinine 1.07, TSH 1.4. Mild chest tightness was noted with mild dyspnea on exertion mainly with swimming or in the early morning hours after he gets up. He is relieved with rest.  EKG demonstrates sinus rhythm with PVC and no other ST segment changes. His pituitary adenoma, prolactin secreting has been very well controlled with low doses of bromocriptine.   Wt Readings from Last 3 Encounters:  03/17/14 187 lb (84.823 kg)  06/11/13 192 lb (87.091 kg)  05/05/13 199 lb (90.266 kg)     Past Medical History  Diagnosis Date  . Pulmonary embolism   . Clotting disorder   . Asthma     hx of as child  . Bronchitis     as a child  . Pituitary tumor     takes medication to manage  . Hypertension     sees [name], primary   . Elevated PSA     being monitored by physician  . Cataract   . 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 Glaucoma 10/13/2011  . 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 Chronic anticoagulation 06/11/2013    Past Surgical History  Procedure Laterality Date  . Knee surgery  1962    left   . Hernia repair  1960  . Tonsillectomy      as a child  . Eye surgery      bilateral cataract surgery  . Inguinal hernia  repair  08/12/2011    Procedure: LAPAROSCOPIC INGUINAL HERNIA;  Surgeon: [name], MD;  Location: [facility];  Service: General;  Laterality: Left;    Current Outpatient Prescriptions  Medication Sig Dispense Refill  . bromocriptine (PARLODEL) 5 MG capsule Take 2.5 mg by mouth daily.       . captopril (CAPOTEN) 25 MG tablet Take 12.5 mg by mouth 2 (two) times daily.       Marland Kitchen latanoprost (XALATAN) 0.005 % ophthalmic solution Place 1 drop into both eyes At bedtime.      . Melatonin 3 MG TABS Take 3 mg by mouth at bedtime as needed. For sleep       . Multiple Vitamin (MULITIVITAMIN WITH MINERALS) TABS Take 1 tablet by mouth daily.      Marland Kitchen tolnaftate (TINACTIN) 1 % spray Apply 1 application topically 2 (two) times daily.      . vardenafil (LEVITRA) 10 MG tablet Take 10 mg by mouth daily as needed for erectile dysfunction.      Marland Kitchen warfarin (COUMADIN) 5 MG tablet Take 5 mg by mouth daily. Mondays & Thursdays takes 7.5 mg       No current facility-administered medications for this visit.    Allergies:   No Known Allergies  Social History:  The patient  reports that he quit smoking about 43 years ago. His smoking use included Cigarettes. He has a 1.5 pack-year smoking history. He has never used smokeless tobacco. He reports that he drinks alcohol. He reports that he does not use illicit drugs. 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 Drives frequently.  Family History  Problem Relation Age of Onset  . Pulmonary embolism Mother   . Pulmonary embolism Father   . Pulmonary embolism Maternal Grandmother   . Pulmonary embolism Maternal Grandfather   . Pulmonary embolism Paternal Grandmother   . Pulmonary embolism Paternal Grandfather     ROS:  Please see the history of present illness.   Denies any fevers, chills, orthopnea, PND, syncope, bleeding. Positive history of recurrent PE.   All other systems reviewed and negative.   PHYSICAL EXAM: VS:  BP 126/84  Pulse 55  Ht 6\' 1"  (1.854  m)  Wt 187 lb (84.823 kg)  BMI 24.68 kg/m2 Well nourished, well developed, in no acute distress HEENT: normal, [place]/AT, EOMI Neck: no JVD, normal carotid upstroke, no bruit Cardiac:  normal S1, S2; RRR; no murmur Lungs:  clear to auscultation bilaterally, no wheezing, rhonchi or rales Abd: soft, nontender, no hepatomegaly, no bruits Ext: no edema, 2+ distal pulses Skin: warm and dry GU: deferred Neuro: no focal abnormalities noted, AAO x 3  EKG:  03/17/14-sinus bradycardia, PA-C, no other significant abnormalities 9/15-sinus rhythm, PVCs, PACs noted on rhythm strip, no ST segment changes. CT scan of chest: 06/26/12-LAD calcification noted throughout proximal and midsection, aortic atherosclerosis noted as well. Prior lab work and clinic notes reviewed.     ASSESSMENT AND PLAN:  1. Chest pain- with associated mild shortness of breath. Endurance during swimming has decreased. With his coronary artery calcification, age, we will proceed with nuclear stress test. He is in agreement. This will allow for detection of ischemia. We will pay careful attention to the LAD region where his coronary calcium is present. 2. Coronary artery calcification/aortic atherosclerosis-seen on CT scan 06/26/12 which at that time was negative for pulmonary embolism recurrence. One could consider statin therapy. He is an advocate of gluten-free diet, anti-inflammatory diet. Paleo diet. His LDL is 107, quite reasonable. One could shoot for goals of less than 100 however given his coronary calcification. 3. History of pulmonary embolism-recurrent. Lifelong Coumadin. I have reviewed Dr. Colin Rhein note. Could consider Xarelto in future. Currently hesitant given lack of antidote. CT scans personally reviewed. Lifelong Coumadin. I discussed with him the coagulation cascade. 4. We will followup with stress test results, if necessary, at office visit to review. In the meantime, I will see him back on as-needed  basis  Signed, [name], MD [facility]  03/17/2014 9:54 AM

## 2014-03-17 NOTE — Patient Instructions (Signed)
The current medical regimen is effective;  continue present plan and medications.  Your physician has requested that you have a lexiscan myoview. For further information please visit [email or web address]. Please follow instruction sheet, as given.  Follow up as needed after testing.

## 2014-03-20 ENCOUNTER — Ambulatory Visit: Payer: Medicare Other | Admitting: Cardiology

## 2014-03-24 ENCOUNTER — Ambulatory Visit (HOSPITAL_COMMUNITY): Payer: Medicare Other | Attending: Cardiology | Admitting: Radiology

## 2014-03-24 ENCOUNTER — Encounter (HOSPITAL_COMMUNITY): Payer: Medicare Other

## 2014-03-24 VITALS — BP 117/92 | HR 48 | Ht 71.0 in | Wt 187.0 lb

## 2014-03-24 DIAGNOSIS — R0600 Dyspnea, unspecified: Secondary | ICD-10-CM | POA: Insufficient documentation

## 2014-03-24 DIAGNOSIS — R0789 Other chest pain: Secondary | ICD-10-CM | POA: Diagnosis present

## 2014-03-24 DIAGNOSIS — I10 Essential (primary) hypertension: Secondary | ICD-10-CM | POA: Diagnosis not present

## 2014-03-24 DIAGNOSIS — I2584 Coronary atherosclerosis due to calcified coronary lesion: Secondary | ICD-10-CM

## 2014-03-24 DIAGNOSIS — I2510 Atherosclerotic heart disease of native coronary artery without angina pectoris: Secondary | ICD-10-CM

## 2014-03-24 MED ORDER — TECHNETIUM TC 99M SESTAMIBI GENERIC - CARDIOLITE
30.0000 | Freq: Once | INTRAVENOUS | Status: AC | PRN
  Administered 2014-03-24: 30 via INTRAVENOUS

## 2014-03-24 MED ORDER — TECHNETIUM TC 99M SESTAMIBI GENERIC - CARDIOLITE
10.0000 | Freq: Once | INTRAVENOUS | Status: AC | PRN
  Administered 2014-03-24: 10 via INTRAVENOUS

## 2014-03-24 NOTE — Progress Notes (Signed)
[facility] [address] [place], [place] [address] [phone]    Cardiology Nuclear Med Study  Jay Day is a 73 y.o. male     MRN : [id number]     DOB: 12/22/1940  Procedure Date: 03/24/2014  Nuclear Med Background Indication for Stress Test:  Evaluation for Ischemia History:  No known CAD Cardiac Risk Factors: Hypertension  Symptoms:  Chest Tightness (daily persistent) and DOE   Nuclear Pre-Procedure Caffeine/Decaff Intake:  None> 12 hrs NPO After: 8:00pm   Lungs:  clear O2 Sat: 95% on room air. IV 0.9% NS with Angio Cath:  20g  IV Site: R Antecubital x 1, tolerated well IV Started by:  Irven Baltimore, RN  Chest Size (in):  42 Cup Size: n/a  Height: 5\' 11"  (1.803 m)  Weight:  187 lb (84.823 kg)  BMI:  Body mass index is 26.09 kg/(m^2). Tech Comments:  N/A    Nuclear Med Study 1 or 2 day study: 1 day  Stress Test Type:  Stress  Reading MD: N/A  Order Authorizing Provider:  [name], MD  Resting Radionuclide: Technetium 87m Sestamibi  Resting Radionuclide Dose: 11.0 mCi   Stress Radionuclide:  Technetium 70m Sestamibi  Stress Radionuclide Dose: 33.0 mCi           Stress Protocol Rest HR: 48 Stress HR: 141  Rest BP: 117/92 Stress BP: 203/102  Exercise Time (min): 9:00 METS: 10.1           Dose of Adenosine (mg):  n/a Dose of Lexiscan: n/a mg  Dose of Atropine (mg): n/a Dose of Dobutamine: n/a mcg/kg/min (at max HR)  Stress Test Technologist: Glade Lloyd, BS-ES  Nuclear Technologist:  Earl Many, CNMT     Rest Procedure:  Myocardial perfusion imaging was performed at rest 45 minutes following the intravenous administration of Technetium 40m Sestamibi. Rest ECG: NSR - Normal EKG  Stress Procedure:  The patient exercised on the treadmill utilizing the Bruce Protocol for 9:00 minutes. The patient stopped due to fatigue and denied any chest pain.  Technetium 40m Sestamibi was injected at peak exercise and myocardial  perfusion imaging was performed after a brief delay. Stress ECG: There are scattered PVCs. and couplets  QPS Raw Data Images:  Normal; no motion artifact; normal heart/lung ratio. Stress Images:  Fixed apical and basal lateral defects Rest Images:  Fixed apical and basal lateral defects Subtraction (SDS):  No evidence of ischemia. Transient Ischemic Dilatation (Normal <1.22):  1.02 Lung/Heart Ratio (Normal <0.45):  0.26  Quantitative Gated Spect Images QGS EDV:  127 ml QGS ESV:  66 ml  Impression Exercise Capacity:  Excellent exercise capacity. BP Response:  Hypertensive blood pressure response. Clinical Symptoms:  No symptoms. ECG Impression:  There are scattered PVCs. Comparison with Prior Nuclear Study: No previous nuclear study performed  Overall Impression:  Intermediate risk stress nuclear study with small-sized, severe intensity fixed apical defect consistent with scar. No reversible ischemia.  LV Ejection Fraction: 48%.  LV Wall Motion:  apical akinesis.  Jay Casino, MD, [facility] Board Certified in Nuclear Cardiology Attending Cardiologist [facility]

## 2014-04-09 ENCOUNTER — Ambulatory Visit: Payer: Medicare Other | Admitting: Cardiology

## 2014-05-01 ENCOUNTER — Encounter: Payer: Self-pay | Admitting: Cardiology

## 2014-05-01 ENCOUNTER — Ambulatory Visit (INDEPENDENT_AMBULATORY_CARE_PROVIDER_SITE_OTHER): Payer: Medicare Other | Admitting: Cardiology

## 2014-05-01 ENCOUNTER — Encounter: Payer: Self-pay | Admitting: *Deleted

## 2014-05-01 VITALS — BP 110/74 | HR 60 | Ht 73.0 in | Wt 190.0 lb

## 2014-05-01 DIAGNOSIS — Z01810 Encounter for preprocedural cardiovascular examination: Secondary | ICD-10-CM

## 2014-05-01 DIAGNOSIS — Z86711 Personal history of pulmonary embolism: Secondary | ICD-10-CM

## 2014-05-01 DIAGNOSIS — R931 Abnormal findings on diagnostic imaging of heart and coronary circulation: Secondary | ICD-10-CM

## 2014-05-01 DIAGNOSIS — R9439 Abnormal result of other cardiovascular function study: Secondary | ICD-10-CM

## 2014-05-01 DIAGNOSIS — I208 Other forms of angina pectoris: Secondary | ICD-10-CM

## 2014-05-01 DIAGNOSIS — Z7901 Long term (current) use of anticoagulants: Secondary | ICD-10-CM

## 2014-05-01 LAB — CBC
HCT: 43.3 % (ref 39.0–52.0)
HEMOGLOBIN: 14.4 g/dL (ref 13.0–17.0)
MCHC: 33.3 g/dL (ref 30.0–36.0)
MCV: 100 fl (ref 78.0–100.0)
PLATELETS: 147 10*3/uL — AB (ref 150.0–400.0)
RBC: 4.33 Mil/uL (ref 4.22–5.81)
RDW: 13.1 % (ref 11.5–15.5)
WBC: 5.4 10*3/uL (ref 4.0–10.5)

## 2014-05-01 LAB — BASIC METABOLIC PANEL
BUN: 19 mg/dL (ref 6–23)
CHLORIDE: 108 meq/L (ref 96–112)
CO2: 28 meq/L (ref 19–32)
CREATININE: 1.1 mg/dL (ref 0.4–1.5)
Calcium: 9.2 mg/dL (ref 8.4–10.5)
GFR: 71.48 mL/min (ref >60.00)
GLUCOSE: 91 mg/dL (ref 70–99)
Potassium: 3.9 mEq/L (ref 3.5–5.1)
Sodium: 141 mEq/L (ref 135–145)

## 2014-05-01 LAB — PROTIME-INR
INR: 2.1 ratio — AB (ref 0.8–1.0)
Prothrombin Time: 23.3 s — ABNORMAL HIGH (ref 9.6–13.1)

## 2014-05-01 NOTE — Progress Notes (Signed)
[facility]. [address], [address] [place], [place]  [address] Phone: [phone] Fax:  [phone]  Date:  05/01/2014   ID:  Jay Day, DOB 04-Oct-1940, MRN [id number]  PCP:  Jay Heckle, MD   History of Present Illness: Jay Day is a 73 y.o. male here for evaluation of chest pain. Has a past medical history of hypertension, pituitary adenoma followed by Dr. Dwyane Day, pulmonary embolism in 2012 treated with Coumadin with recurrent PE in 2013, Coumadin restarted. Dr. Beryle Day evaluation negative for her it'll cost. LDL 107, HDL 52, creatinine 1.07, TSH 1.4. Mild chest tightness was noted with mild dyspnea on exertion mainly with swimming or in the early morning hours after he gets up. He is relieved with rest.  EKG demonstrates sinus rhythm with PVC and no other ST segment changes. His pituitary adenoma, prolactin secreting has been very well controlled with low doses of bromocriptine.  His nuclear stress test as below was abnormal. Decreased endurance during swimming. See below.   Wt Readings from Last 3 Encounters:  05/01/14 190 lb (86.183 kg)  03/24/14 187 lb (84.823 kg)  03/17/14 187 lb (84.823 kg)     Past Medical History  Diagnosis Date  . Pulmonary embolism   . Clotting disorder   . Asthma     hx of as child  . Bronchitis     as a child  . Pituitary tumor     takes medication to manage  . Hypertension     sees Dr. Maxwell Day, primary   . Elevated PSA     being monitored by physician  . Cataract   . 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 Glaucoma 10/13/2011  . 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 Chronic anticoagulation 06/11/2013    Past Surgical History  Procedure Laterality Date  . Knee surgery  1962    left   . Hernia repair  1960  .  Tonsillectomy      as a child  . Eye surgery      bilateral cataract surgery  . Inguinal hernia repair  08/12/2011    Procedure: LAPAROSCOPIC INGUINAL HERNIA;  Surgeon: Jay Hector, MD;  Location: [facility];  Service: General;  Laterality: Left;    Current Outpatient Prescriptions  Medication Sig Dispense Refill  . bromocriptine (PARLODEL) 5 MG capsule Take 2.5 mg by mouth daily.     . captopril (CAPOTEN) 25 MG tablet Take 12.5 mg by mouth 2 (two) times daily.     Marland Kitchen latanoprost (XALATAN) 0.005 % ophthalmic solution Place 1 drop into both eyes At bedtime.    . Melatonin 3 MG TABS Take 3 mg by mouth at bedtime as needed. For sleep     . Multiple Vitamin (MULITIVITAMIN WITH MINERALS) TABS Take 1 tablet by mouth daily.    . vardenafil (LEVITRA) 10 MG tablet Take 10 mg by mouth daily as needed for erectile dysfunction.    Marland Kitchen warfarin (COUMADIN) 5 MG tablet Take 5 mg by mouth daily. Mondays & Thursdays takes 7.5 mg     No current facility-administered medications for this visit.    Allergies:   No Known Allergies  Social History:  The patient  reports that he quit smoking about 43 years ago. His smoking  use included Cigarettes. He has a 1.5 pack-year smoking history. He has never used smokeless tobacco. He reports that he drinks alcohol. He reports that he does not use illicit drugs. 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 Drives frequently.  Family History  Problem Relation Age of Onset  . Pulmonary embolism Mother   . Pulmonary embolism Father   . Pulmonary embolism Maternal Grandmother   . Pulmonary embolism Maternal Grandfather   . Pulmonary embolism Paternal Grandmother   . Pulmonary embolism Paternal Grandfather     ROS:  Please see the history of present illness.   Denies any fevers, chills, orthopnea, PND, syncope, bleeding. Positive history of recurrent PE.   All other systems reviewed and negative.   PHYSICAL EXAM: VS:  BP 110/74 mmHg  Pulse 60  Ht 6\' 1"  (1.854  m)  Wt 190 lb (86.183 kg)  BMI 25.07 kg/m2 Well nourished, well developed, in no acute distress HEENT: normal, [place]/AT, EOMI Neck: no JVD, normal carotid upstroke, no bruit Cardiac:  normal S1, S2; RRR; no murmur Lungs:  clear to auscultation bilaterally, no wheezing, rhonchi or rales Abd: soft, nontender, no hepatomegaly, no bruits Ext: no edema, 2+ distal pulses, 2+ radial Skin: warm and dry GU: deferred Neuro: no focal abnormalities noted, AAO x 3  EKG:  03/17/14-sinus bradycardia, PA-C, no other significant abnormalities 9/15-sinus rhythm, PVCs, PACs noted on rhythm strip, no ST segment changes.  CT scan of chest: 06/26/12-LAD calcification noted throughout proximal and midsection, aortic atherosclerosis noted as well.  Nuclear stress test 03/24/14-Intermediate risk stress nuclear study with small-sized, severe intensity fixed apical defect consistent with scar. No reversible ischemia.  LV Ejection Fraction: 48%. LV Wall Motion: apical akinesis.  Prior lab work and clinic notes reviewed.     ASSESSMENT AND PLAN:  1. Chest pain- with associated mild shortness of breath. Endurance during swimming has decreased. With his coronary artery calcification, age, and intermediate risk nuclear stress test with fixed apical defect, EF 48% we will proceed with cardiac catheterization. He is in agreement. Risks and benefits of procedure have been discussed including stroke, heart attack, death, renal impairment, bleeding. He is on Coumadin. We will have him take his last dose tonight. Beginning on November 20, he will hold his Coumadin for procedure on the 24th. That should be more than adequate. We will pay careful attention to the LAD region where his coronary calcium is present. 2. Coronary artery calcification/aortic atherosclerosis-seen on CT scan 06/26/12 which at that time was negative for pulmonary embolism recurrence. One could consider statin therapy. He is an advocate of gluten-free diet,  anti-inflammatory diet. Paleo diet. His LDL is 107, quite reasonable. One could shoot for goals of less than 100 however given his coronary calcification. 3. History of pulmonary embolism-recurrent. Lifelong Coumadin. I have reviewed Dr. Colin Rhein note. Could consider Xarelto in future. Currently hesitant given lack of antidote. CT scans personally reviewed. Lifelong Coumadin. I discussed with him the coagulation cascade.  Post cath follow up.  Cath Lab Visit (complete for each Cath Lab visit)  Clinical Evaluation Leading to the Procedure:   ACS: No.  Non-ACS:    Anginal Classification: CCS II  Anti-ischemic medical therapy: No Therapy  Non-Invasive Test Results: Intermediate-risk stress test findings: cardiac mortality 1-3%/year  Prior CABG: No previous CABG  Orders have been placed.   Signed, [name], MD [facility]  05/01/2014 8:30 AM

## 2014-05-01 NOTE — Patient Instructions (Signed)
The current medical regimen is effective;  continue present plan and medications.  Please have blood work today. (CBC,BMP and INR).  Your physician has requested that you have a cardiac catheterization. Cardiac catheterization is used to diagnose and/or treat various heart conditions. Doctors may recommend this procedure for a number of different reasons. The most common reason is to evaluate chest pain. Chest pain can be a symptom of coronary artery disease (CAD), and cardiac catheterization can show whether plaque is narrowing or blocking your heart's arteries. This procedure is also used to evaluate the valves, as well as measure the blood flow and oxygen levels in different parts of your heart. For further information please visit [email or web address]. Please follow instruction sheet, as given.  Follow up with Dr Marlou Porch about 2 weeks after cath.

## 2014-05-06 ENCOUNTER — Ambulatory Visit (HOSPITAL_COMMUNITY)
Admission: RE | Admit: 2014-05-06 | Discharge: 2014-05-06 | Disposition: A | Discharge status: Home / Self Care | Payer: Medicare Other | Source: Ambulatory Visit | Attending: Cardiology | Admitting: Cardiology

## 2014-05-06 ENCOUNTER — Encounter (HOSPITAL_COMMUNITY)
Admission: RE | Disposition: A | Discharge status: Home / Self Care | Payer: Self-pay | Source: Ambulatory Visit | Attending: Cardiology

## 2014-05-06 DIAGNOSIS — J45909 Unspecified asthma, uncomplicated: Secondary | ICD-10-CM | POA: Diagnosis not present

## 2014-05-06 DIAGNOSIS — Z01810 Encounter for preprocedural cardiovascular examination: Secondary | ICD-10-CM

## 2014-05-06 DIAGNOSIS — I700 Atherosclerosis of aorta: Secondary | ICD-10-CM

## 2014-05-06 DIAGNOSIS — Z86718 Personal history of other venous thrombosis and embolism: Secondary | ICD-10-CM | POA: Diagnosis not present

## 2014-05-06 DIAGNOSIS — Z7901 Long term (current) use of anticoagulants: Secondary | ICD-10-CM | POA: Insufficient documentation

## 2014-05-06 DIAGNOSIS — R079 Chest pain, unspecified: Secondary | ICD-10-CM | POA: Insufficient documentation

## 2014-05-06 DIAGNOSIS — R9439 Abnormal result of other cardiovascular function study: Secondary | ICD-10-CM | POA: Diagnosis present

## 2014-05-06 DIAGNOSIS — I10 Essential (primary) hypertension: Secondary | ICD-10-CM | POA: Insufficient documentation

## 2014-05-06 DIAGNOSIS — I2510 Atherosclerotic heart disease of native coronary artery without angina pectoris: Secondary | ICD-10-CM

## 2014-05-06 DIAGNOSIS — Z86711 Personal history of pulmonary embolism: Secondary | ICD-10-CM | POA: Insufficient documentation

## 2014-05-06 DIAGNOSIS — I208 Other forms of angina pectoris: Secondary | ICD-10-CM

## 2014-05-06 DIAGNOSIS — Z951 Presence of aortocoronary bypass graft: Secondary | ICD-10-CM | POA: Insufficient documentation

## 2014-05-06 HISTORY — PX: LEFT HEART CATHETERIZATION WITH CORONARY ANGIOGRAM: SHX5451

## 2014-05-06 LAB — PROTIME-INR
INR: 1.12 (ref 0.00–1.49)
Prothrombin Time: 14.5 seconds (ref 11.6–15.2)

## 2014-05-06 SURGERY — LEFT HEART CATHETERIZATION WITH CORONARY ANGIOGRAM
Anesthesia: LOCAL

## 2014-05-06 MED ORDER — HEPARIN (PORCINE) IN NACL 2-0.9 UNIT/ML-% IJ SOLN
INTRAMUSCULAR | Status: AC
  Filled 2014-05-06: qty 1000

## 2014-05-06 MED ORDER — VERAPAMIL HCL 2.5 MG/ML IV SOLN
INTRAVENOUS | Status: AC
  Filled 2014-05-06: qty 2

## 2014-05-06 MED ORDER — SODIUM CHLORIDE 0.9 % IV SOLN: 1.0000 mL/kg/h | INTRAVENOUS | Status: DC

## 2014-05-06 MED ORDER — LIDOCAINE HCL (PF) 1 % IJ SOLN
INTRAMUSCULAR | Status: AC
  Filled 2014-05-06: qty 30

## 2014-05-06 MED ORDER — NITROGLYCERIN 1 MG/10 ML FOR IR/CATH LAB
INTRA_ARTERIAL | Status: AC
  Filled 2014-05-06: qty 10

## 2014-05-06 MED ORDER — ASPIRIN 81 MG PO CHEW: 81.0000 mg | CHEWABLE_TABLET | ORAL | Status: DC

## 2014-05-06 MED ORDER — SODIUM CHLORIDE 0.9 % IV SOLN: 250.0000 mL | INTRAVENOUS | Status: DC | PRN

## 2014-05-06 MED ORDER — SODIUM CHLORIDE 0.9 % IJ SOLN: 3.0000 mL | Freq: Two times a day (BID) | INTRAMUSCULAR | Status: DC

## 2014-05-06 MED ORDER — FENTANYL CITRATE 0.05 MG/ML IJ SOLN
INTRAMUSCULAR | Status: AC
  Filled 2014-05-06: qty 2

## 2014-05-06 MED ORDER — MIDAZOLAM HCL 2 MG/2ML IJ SOLN
INTRAMUSCULAR | Status: AC
  Filled 2014-05-06: qty 2

## 2014-05-06 MED ORDER — SODIUM CHLORIDE 0.9 % IJ SOLN: 3.0000 mL | INTRAMUSCULAR | Status: DC | PRN

## 2014-05-06 NOTE — CV Procedure (Signed)
    CARDIAC CATHETERIZATION  PROCEDURE:  Left heart catheterization with selective coronary angiography, left ventriculogram via the radial artery approach.  INDICATIONS:  73 year old with abnormal stress test with apical infarct pattern EF 45%. Decreased exercise tolerance. Swimmer.   The risks, benefits, and details of the procedure were explained to the patient, including possibilities of stroke, heart attack, death, renal impairment, arterial damage, bleeding.  The patient verbalized understanding and wanted to proceed.  Informed written consent was obtained.  PROCEDURE TECHNIQUE:  Allen's test was performed pre-and post procedure and was normal. The right radial artery site was prepped and draped in a sterile fashion. One percent lidocaine was used for local anesthesia. Using the modified Seldinger technique a 5 French hydrophilic sheath was inserted into the radial artery without difficulty. 3 mg of verapamil was administered via the sheath. A Judkins right #4 catheter with the guidance of a Versicore wire was placed in the right coronary cusp and selectively cannulated the right coronary artery. After traversing the aortic arch, 4500 units of heparin IV was administered. A Judkins left #3.5 catheter was unable to pass into cusp, question vasospasm. 274mcg NTG and 1mg  of Versed given. Guide cath EBU 3.5 was used and passed without difficultly. Selectively cannulate the left main artery. Multiple views with hand injection of Omnipaque were obtained. Catheter a pigtail catheter was used to cross into the left ventricle, hemodynamics were obtained, and a left ventriculogram was performed in the RAO position with power injection. Following the procedure, sheath was removed, patient was hemodynamically stable, hemostasis was maintained with a Terumo T band.   CONTRAST:  Total of 80 ml.    FLOUROSCOPY TIME: 4.0 min.  COMPLICATIONS:  None.    HEMODYNAMICS:  Aortic pressure was 702/63ZCHY; LV  systolic pressure was 850YDXA; LVEDP 38mmHg.  There was no gradient between the left ventricle and aorta.    ANGIOGRAPHIC DATA:    Left main: No angiographically significant disease, branches into LAD and circumflex  Left anterior descending (LAD): There is minor mid LAD stenosis for proximally 30%, nonflow limiting. The remainder of the vessel is widely patent with patent distal vessel. There is no evidence of occlusion distally.  Circumflex artery (CIRC): No angiographically significant disease  Right coronary artery (RCA): Dominant vessel giving rise to PDA, no angiographically significant disease  LEFT VENTRICULOGRAM:  Left ventricular angiogram was done in the 30 RAO projection and revealed apical dyskinesis consistent with scar/infarct with an estimated ejection fraction of 45-50%.   IMPRESSIONS:  Minor mid LAD stenosis approximate 30%, nonflow limiting with otherwise no angiographically significant coronary artery disease. Mildly reduced left ventricular systolic function with evidence of apical dyskinesis/scar/infarct/aneurysmal segment.  LVEDP 9 mmHg.  Ejection fraction 45-50%.  RECOMMENDATION:  We will continue with medical management, there is no area of occlusion responsible for infarct. Minor mid LAD stenosis.

## 2014-05-06 NOTE — H&P (View-Only) (Signed)
[facility]. [address] [place], [place]  [address] Phone: [phone] Fax:  [phone]  Date:  05/01/2014   ID:  Jay Day, DOB 07/12/1940, MRN [id number]  PCP:  [name], MD   History of Present Illness: Jay Day is a 73 y.o. male here for evaluation of chest pain. Has a past medical history of hypertension, pituitary adenoma followed by Dr. [name], pulmonary embolism in 2012 treated with Coumadin with recurrent PE in 2013, Coumadin restarted. Dr. [name] evaluation negative for her it'll cost. LDL 107, HDL 52, creatinine 1.07, TSH 1.4. Mild chest tightness was noted with mild dyspnea on exertion mainly with swimming or in the early morning hours after he gets up. He is relieved with rest.  EKG demonstrates sinus rhythm with PVC and no other ST segment changes. His pituitary adenoma, prolactin secreting has been very well controlled with low doses of bromocriptine.  His nuclear stress test as below was abnormal. Decreased endurance during swimming. See below.   Wt Readings from Last 3 Encounters:  05/01/14 190 lb (86.183 kg)  03/24/14 187 lb (84.823 kg)  03/17/14 187 lb (84.823 kg)     Past Medical History  Diagnosis Date  . Pulmonary embolism   . Clotting disorder   . Asthma     hx of as child  . Bronchitis     as a child  . Pituitary tumor     takes medication to manage  . Hypertension     sees [name], primary   . Elevated PSA     being monitored by physician  . Cataract   . 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 Glaucoma 10/13/2011  . 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 Chronic anticoagulation 06/11/2013    Past Surgical History  Procedure Laterality Date  . Knee surgery  1962    left   . Hernia repair  1960  .  Tonsillectomy      as a child  . Eye surgery      bilateral cataract surgery  . Inguinal hernia repair  08/12/2011    Procedure: LAPAROSCOPIC INGUINAL HERNIA;  Surgeon: [name], MD;  Location: [facility];  Service: General;  Laterality: Left;    Current Outpatient Prescriptions  Medication Sig Dispense Refill  . bromocriptine (PARLODEL) 5 MG capsule Take 2.5 mg by mouth daily.     . captopril (CAPOTEN) 25 MG tablet Take 12.5 mg by mouth 2 (two) times daily.     Marland Kitchen latanoprost (XALATAN) 0.005 % ophthalmic solution Place 1 drop into both eyes At bedtime.    . Melatonin 3 MG TABS Take 3 mg by mouth at bedtime as needed. For sleep     . Multiple Vitamin (MULITIVITAMIN WITH MINERALS) TABS Take 1 tablet by mouth daily.    . vardenafil (LEVITRA) 10 MG tablet Take 10 mg by mouth daily as needed for erectile dysfunction.    Marland Kitchen warfarin (COUMADIN) 5 MG tablet Take 5 mg by mouth daily. Mondays & Thursdays takes 7.5 mg     No current facility-administered medications for this visit.    Allergies:   No Known Allergies  Social History:  The patient  reports that he quit smoking about 43 years ago. His smoking  use included Cigarettes. He has a 1.5 pack-year smoking history. He has never used smokeless tobacco. He reports that he drinks alcohol. He reports that he does not use illicit drugs. 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 Drives frequently.  Family History  Problem Relation Age of Onset  . Pulmonary embolism Mother   . Pulmonary embolism Father   . Pulmonary embolism Maternal Grandmother   . Pulmonary embolism Maternal Grandfather   . Pulmonary embolism Paternal Grandmother   . Pulmonary embolism Paternal Grandfather     ROS:  Please see the history of present illness.   Denies any fevers, chills, orthopnea, PND, syncope, bleeding. Positive history of recurrent PE.   All other systems reviewed and negative.   PHYSICAL EXAM: VS:  BP 110/74 mmHg  Pulse 60  Ht 6\' 1"  (7.867  m)  Wt 190 lb (86.183 kg)  BMI 25.07 kg/m2 Well nourished, well developed, in no acute distress HEENT: normal, [place]/AT, EOMI Neck: no JVD, normal carotid upstroke, no bruit Cardiac:  normal S1, S2; RRR; no murmur Lungs:  clear to auscultation bilaterally, no wheezing, rhonchi or rales Abd: soft, nontender, no hepatomegaly, no bruits Ext: no edema, 2+ distal pulses, 2+ radial Skin: warm and dry GU: deferred Neuro: no focal abnormalities noted, AAO x 3  EKG:  03/17/14-sinus bradycardia, PA-C, no other significant abnormalities 9/15-sinus rhythm, PVCs, PACs noted on rhythm strip, no ST segment changes.  CT scan of chest: 06/26/12-LAD calcification noted throughout proximal and midsection, aortic atherosclerosis noted as well.  Nuclear stress test 03/24/14-Intermediate risk stress nuclear study with small-sized, severe intensity fixed apical defect consistent with scar. No reversible ischemia.  LV Ejection Fraction: 48%. LV Wall Motion: apical akinesis.  Prior lab work and clinic notes reviewed.     ASSESSMENT AND PLAN:  1. Chest pain- with associated mild shortness of breath. Endurance during swimming has decreased. With his coronary artery calcification, age, and intermediate risk nuclear stress test with fixed apical defect, EF 48% we will proceed with cardiac catheterization. He is in agreement. Risks and benefits of procedure have been discussed including stroke, heart attack, death, renal impairment, bleeding. He is on Coumadin. We will have him take his last dose tonight. Beginning on November 20, he will hold his Coumadin for procedure on the 24th. That should be more than adequate. We will pay careful attention to the LAD region where his coronary calcium is present. 2. Coronary artery calcification/aortic atherosclerosis-seen on CT scan 06/26/12 which at that time was negative for pulmonary embolism recurrence. One could consider statin therapy. He is an advocate of gluten-free diet,  anti-inflammatory diet. Paleo diet. His LDL is 107, quite reasonable. One could shoot for goals of less than 100 however given his coronary calcification. 3. History of pulmonary embolism-recurrent. Lifelong Coumadin. I have reviewed Dr. Colin Rhein note. Could consider Xarelto in future. Currently hesitant given lack of antidote. CT scans personally reviewed. Lifelong Coumadin. I discussed with him the coagulation cascade.  Post cath follow up.  Cath Lab Visit (complete for each Cath Lab visit)  Clinical Evaluation Leading to the Procedure:   ACS: No.  Non-ACS:    Anginal Classification: CCS II  Anti-ischemic medical therapy: No Therapy  Non-Invasive Test Results: Intermediate-risk stress test findings: cardiac mortality 1-3%/year  Prior CABG: No previous CABG  Orders have been placed.   Signed, [name], MD [facility]  05/01/2014 8:30 AM

## 2014-05-06 NOTE — Interval H&P Note (Signed)
History and Physical Interval Note:  05/06/2014 7:46 AM  Jay Day  has presented today for surgery, with the diagnosis of abnormal nuc  The various methods of treatment have been discussed with the patient and family. After consideration of risks, benefits and other options for treatment, the patient has consented to  Procedure(s): LEFT HEART CATHETERIZATION WITH CORONARY ANGIOGRAM (N/A) as a surgical intervention .  The patient's history has been reviewed, patient examined, no change in status, stable for surgery.  I have reviewed the patient's chart and labs.  Questions were answered to the patient's satisfaction.     Alona Danford

## 2014-05-06 NOTE — Discharge Instructions (Signed)
Radial Site Care °Refer to this sheet in the next few weeks. These instructions provide you with information on caring for yourself after your procedure. Your caregiver may also give you more specific instructions. Your treatment has been planned according to current medical practices, but problems sometimes occur. Call your caregiver if you have any problems or questions after your procedure. °HOME CARE INSTRUCTIONS °· You may shower the day after the procedure. Remove the bandage (dressing) and gently wash the site with plain soap and water. Gently pat the site dry. °· Do not apply powder or lotion to the site. °· Do not submerge the affected site in water for 3 to 5 days. °· Inspect the site at least twice daily. °· Do not flex or bend the affected arm for 24 hours. °· No lifting over 5 pounds (2.3 kg) for 5 days after your procedure. °· Do not drive home if you are discharged the same day of the procedure. Have someone else drive you. °· You may drive 24 hours after the procedure unless otherwise instructed by your caregiver. °· Do not operate machinery or power tools for 24 hours. °· A responsible adult should be with you for the first 24 hours after you arrive home. °What to expect: °· Any bruising will usually fade within 1 to 2 weeks. °· Blood that collects in the tissue (hematoma) may be painful to the touch. It should usually decrease in size and tenderness within 1 to 2 weeks. °SEEK IMMEDIATE MEDICAL CARE IF: °· You have unusual pain at the radial site. °· You have redness, warmth, swelling, or pain at the radial site. °· You have drainage (other than a small amount of blood on the dressing). °· You have chills. °· You have a fever or persistent symptoms for more than 72 hours. °· You have a fever and your symptoms suddenly get worse. °· Your arm becomes pale, cool, tingly, or numb. °· You have heavy bleeding from the site. Hold pressure on the site. °Document Released: 07/02/2010 Document Revised:  08/22/2011 Document Reviewed: 07/02/2010 °ExitCare® Patient Information ©2015 [employer]. This information is not intended to replace advice given to you by your health care provider. Make sure you discuss any questions you have with your health care provider. ° °

## 2014-05-07 ENCOUNTER — Telehealth: Payer: Self-pay | Admitting: Cardiology

## 2014-05-07 NOTE — Telephone Encounter (Signed)
New message      Pt had cath yesterday, when can he restart his coumadin?

## 2014-05-07 NOTE — Telephone Encounter (Signed)
Pt aware - OK to restart Coumadin

## 2014-05-14 ENCOUNTER — Encounter: Payer: Self-pay | Admitting: *Deleted

## 2014-05-16 ENCOUNTER — Ambulatory Visit (INDEPENDENT_AMBULATORY_CARE_PROVIDER_SITE_OTHER): Payer: Medicare Other | Admitting: Cardiology

## 2014-05-16 ENCOUNTER — Encounter: Payer: Self-pay | Admitting: Cardiology

## 2014-05-16 VITALS — BP 126/60 | HR 60 | Ht 73.0 in | Wt 190.8 lb

## 2014-05-16 DIAGNOSIS — Z86711 Personal history of pulmonary embolism: Secondary | ICD-10-CM

## 2014-05-16 DIAGNOSIS — I2510 Atherosclerotic heart disease of native coronary artery without angina pectoris: Secondary | ICD-10-CM

## 2014-05-16 DIAGNOSIS — I2584 Coronary atherosclerosis due to calcified coronary lesion: Secondary | ICD-10-CM

## 2014-05-16 DIAGNOSIS — R0789 Other chest pain: Secondary | ICD-10-CM

## 2014-05-16 MED ORDER — METOPROLOL SUCCINATE ER 25 MG PO TB24: 25.0000 mg | ORAL_TABLET | Freq: Every day | ORAL | Status: DC

## 2014-05-16 NOTE — Progress Notes (Signed)
[facility]. [address], [address] [place], [place]  [address] Phone: [phone] Fax:  [phone]  Date:  05/16/2014   ID:  Jay Day, DOB 10-15-1940, MRN [id number]  PCP:  [name], MD   History of Present Illness: Jay Day is a 73 y.o. male here for evaluation of chest pain. Has a past medical history of hypertension, pituitary adenoma followed by Dr. [name], pulmonary embolism in 2012 treated with Coumadin with recurrent PE in 2013, Coumadin restarted. Dr. [name] evaluation negative for her it'll cost. LDL 107, HDL 52, creatinine 1.07, TSH 1.4. Mild chest tightness was noted with mild dyspnea on exertion mainly with swimming or in the early morning hours after he gets up. He is relieved with rest.  EKG demonstrates sinus rhythm with PVC and no other ST segment changes. His pituitary adenoma, prolactin secreting has been very well controlled with low doses of bromocriptine.  His nuclear stress test as below was abnormal. Decreased endurance during swimming. See below.  Cath no flow limiting dz. LAD 30%, Apical akinesis. 45% EF.    Wt Readings from Last 3 Encounters:  05/06/14 185 lb (83.915 kg)  05/01/14 190 lb (86.183 kg)  03/24/14 187 lb (84.823 kg)     Past Medical History  Diagnosis Date  . Pulmonary embolism   . Clotting disorder   . Asthma     hx of as child  . Bronchitis     as a child  . Pituitary tumor     takes medication to manage  . Hypertension     sees [name], primary   . Elevated PSA     being monitored by physician  . Cataract   . 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 Glaucoma 10/13/2011  . 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 Chronic anticoagulation 06/11/2013    Past Surgical History  Procedure Laterality Date  . Knee  surgery  1962    left   . Hernia repair  1960  . Tonsillectomy      as a child  . Eye surgery      bilateral cataract surgery  . Inguinal hernia repair  08/12/2011    Procedure: LAPAROSCOPIC INGUINAL HERNIA;  Surgeon: [name], MD;  Location: [facility];  Service: General;  Laterality: Left;    Current Outpatient Prescriptions  Medication Sig Dispense Refill  . Ascorbic Acid (VITAMIN C) 1000 MG tablet Take 1,000 mg by mouth daily.    . bromocriptine (PARLODEL) 5 MG capsule Take 2.5 mg by mouth daily.     . captopril (CAPOTEN) 25 MG tablet Take 12.5 mg by mouth 2 (two) times daily.     . Cholecalciferol (VITAMIN D-3) 5000 UNITS TABS Take 1 tablet by mouth daily.    . COLOSTRUM PO Take 1 capsule by mouth daily.    . Copper Gluconate (COPPER CAPS) 2 MG CAPS Take 1 capsule by mouth daily.    Marland Kitchen l-methylfolate-B6-B12 (METANX) 3-35-2 MG TABS Take 1 tablet by mouth daily.    Marland Kitchen latanoprost (XALATAN) 0.005 % ophthalmic solution Place 1 drop into both eyes at bedtime.     . Melatonin 3 MG TABS Take 3 mg by mouth at bedtime as needed (sleep).     . Methylcobalamin  1 MG CHEW Chew 1 tablet by mouth daily.    . Multiple Vitamin (MULITIVITAMIN WITH MINERALS) TABS Take 1 tablet by mouth daily.    . Pregnenolone Micronized POWD Take 1 capsule by mouth daily.    . Probiotic Product (PROBIOTIC PO) Take 1 tablet by mouth daily.    . vardenafil (LEVITRA) 10 MG tablet Take 10 mg by mouth daily as needed for erectile dysfunction.    Marland Kitchen warfarin (COUMADIN) 5 MG tablet Take 5 mg by mouth daily.     . Zinc 50 MG TABS Take 1 tablet by mouth daily.     No current facility-administered medications for this visit.    Allergies:   No Known Allergies  Social History:  The patient  reports that he quit smoking about 43 years ago. His smoking use included Cigarettes. He has a 1.5 pack-year smoking history. He has never used smokeless tobacco. He reports that he drinks alcohol. He reports that he does not use illicit  drugs. 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 Drives frequently.  Family History  Problem Relation Age of Onset  . Pulmonary embolism Mother   . Pulmonary embolism Father   . Pulmonary embolism Maternal Grandmother   . Pulmonary embolism Maternal Grandfather   . Pulmonary embolism Paternal Grandmother   . Pulmonary embolism Paternal Grandfather     ROS:  Please see the history of present illness.   Denies any fevers, chills, orthopnea, PND, syncope, bleeding. Positive history of recurrent PE.   All other systems reviewed and negative.   PHYSICAL EXAM: VS:  There were no vitals taken for this visit. Well nourished, well developed, in no acute distress HEENT: normal, [place]/AT, EOMI Neck: no JVD, normal carotid upstroke, no bruit Cardiac:  normal S1, S2; RRR; no murmur Lungs:  clear to auscultation bilaterally, no wheezing, rhonchi or rales Abd: soft, nontender, no hepatomegaly, no bruits Ext: no edema, 2+ distal pulses, 2+ radial Skin: warm and dry GU: deferred Neuro: no focal abnormalities noted, AAO x 3  EKG:  03/17/14-sinus bradycardia, PA-C, no other significant abnormalities 9/15-sinus rhythm, PVCs, PACs noted on rhythm strip, no ST segment changes.  CT scan of chest: 06/26/12-LAD calcification noted throughout proximal and midsection, aortic atherosclerosis noted as well.  Nuclear stress test 03/24/14-Intermediate risk stress nuclear study with small-sized, severe intensity fixed apical defect consistent with scar. No reversible ischemia.  LV Ejection Fraction: 48%. LV Wall Motion: apical akinesis.  Prior lab work and clinic notes reviewed.     ASSESSMENT AND PLAN:  1. Chest pain- Minor 30% LAD, apical scar noted on LV gram. Starting Toprol xl 25mg .  Intermediate risk nuclear stress test with fixed apical defect, EF 48%. He is in agreement. We will pay careful attention to the LAD region where his coronary calcium is present. Continue ACE-I. 2. Coronary artery  calcification/aortic atherosclerosis-seen on CT scan 06/26/12 which at that time was negative for pulmonary embolism recurrence. One could consider statin therapy. He is an advocate of gluten-free diet, anti-inflammatory diet. Paleo diet. His LDL is 107, quite reasonable. One could shoot for goals of less than 100 however given his coronary calcification. He is not interested in statin at this time.  3. History of pulmonary embolism-recurrent. Lifelong Coumadin. I have reviewed Dr. Colin Rhein note. Could consider Xarelto in future. Currently hesitant given lack of antidote. CT scans personally reviewed. Lifelong Coumadin. I discussed with him the coagulation cascade. 4. 3 month follow up. He will call if any side effects of Toprol.  Signed, [name], MD [facility]  05/16/2014 9:23 AM

## 2014-05-16 NOTE — Patient Instructions (Signed)
Please start Toprol 25 mg once a day. Continue all other medications as listed.  Follow up in 3 months with Dr Marlou Porch.

## 2014-05-22 ENCOUNTER — Encounter (HOSPITAL_COMMUNITY): Payer: Self-pay | Admitting: Cardiology

## 2014-06-13 DIAGNOSIS — I639 Cerebral infarction, unspecified: Secondary | ICD-10-CM

## 2014-06-13 HISTORY — DX: Cerebral infarction, unspecified: I63.9

## 2014-07-11 ENCOUNTER — Encounter (HOSPITAL_COMMUNITY): Payer: Self-pay | Admitting: Physical Medicine and Rehabilitation

## 2014-07-11 ENCOUNTER — Telehealth: Payer: Self-pay | Admitting: Cardiology

## 2014-07-11 ENCOUNTER — Emergency Department (HOSPITAL_COMMUNITY)
Admission: EM | Admit: 2014-07-11 | Discharge: 2014-07-11 | Disposition: A | Discharge status: Home / Self Care | Payer: Medicare Other | Attending: Emergency Medicine | Admitting: Emergency Medicine

## 2014-07-11 ENCOUNTER — Emergency Department (HOSPITAL_COMMUNITY): Payer: Medicare Other

## 2014-07-11 DIAGNOSIS — Z86718 Personal history of other venous thrombosis and embolism: Secondary | ICD-10-CM | POA: Insufficient documentation

## 2014-07-11 DIAGNOSIS — I10 Essential (primary) hypertension: Secondary | ICD-10-CM | POA: Insufficient documentation

## 2014-07-11 DIAGNOSIS — Z7901 Long term (current) use of anticoagulants: Secondary | ICD-10-CM | POA: Diagnosis not present

## 2014-07-11 DIAGNOSIS — Z9842 Cataract extraction status, left eye: Secondary | ICD-10-CM | POA: Diagnosis not present

## 2014-07-11 DIAGNOSIS — Z86711 Personal history of pulmonary embolism: Secondary | ICD-10-CM | POA: Insufficient documentation

## 2014-07-11 DIAGNOSIS — Z87891 Personal history of nicotine dependence: Secondary | ICD-10-CM | POA: Diagnosis not present

## 2014-07-11 DIAGNOSIS — Z9861 Coronary angioplasty status: Secondary | ICD-10-CM | POA: Insufficient documentation

## 2014-07-11 DIAGNOSIS — R079 Chest pain, unspecified: Secondary | ICD-10-CM

## 2014-07-11 DIAGNOSIS — H409 Unspecified glaucoma: Secondary | ICD-10-CM | POA: Insufficient documentation

## 2014-07-11 DIAGNOSIS — Z9841 Cataract extraction status, right eye: Secondary | ICD-10-CM | POA: Insufficient documentation

## 2014-07-11 DIAGNOSIS — I252 Old myocardial infarction: Secondary | ICD-10-CM | POA: Insufficient documentation

## 2014-07-11 DIAGNOSIS — Z79899 Other long term (current) drug therapy: Secondary | ICD-10-CM | POA: Insufficient documentation

## 2014-07-11 DIAGNOSIS — J45909 Unspecified asthma, uncomplicated: Secondary | ICD-10-CM | POA: Insufficient documentation

## 2014-07-11 HISTORY — DX: Acute myocardial infarction, unspecified: I21.9

## 2014-07-11 LAB — CBC WITH DIFFERENTIAL/PLATELET
BASOS ABS: 0 10*3/uL (ref 0.0–0.1)
BASOS PCT: 1 % (ref 0–1)
Eosinophils Absolute: 0.3 10*3/uL (ref 0.0–0.7)
Eosinophils Relative: 6 % — ABNORMAL HIGH (ref 0–5)
HEMATOCRIT: 42 % (ref 39.0–52.0)
Hemoglobin: 14.8 g/dL (ref 13.0–17.0)
LYMPHS ABS: 1.3 10*3/uL (ref 0.7–4.0)
Lymphocytes Relative: 26 % (ref 12–46)
MCH: 33.8 pg (ref 26.0–34.0)
MCHC: 35.2 g/dL (ref 30.0–36.0)
MCV: 95.9 fL (ref 78.0–100.0)
Monocytes Absolute: 0.5 10*3/uL (ref 0.1–1.0)
Monocytes Relative: 11 % (ref 3–12)
NEUTROS ABS: 2.9 10*3/uL (ref 1.7–7.7)
NEUTROS PCT: 56 % (ref 43–77)
PLATELETS: 140 10*3/uL — AB (ref 150–400)
RBC: 4.38 MIL/uL (ref 4.22–5.81)
RDW: 12.3 % (ref 11.5–15.5)
WBC: 5 10*3/uL (ref 4.0–10.5)

## 2014-07-11 LAB — COMPREHENSIVE METABOLIC PANEL
ALT: 18 U/L (ref 0–53)
ANION GAP: 3 — AB (ref 5–15)
AST: 33 U/L (ref 0–37)
Albumin: 4 g/dL (ref 3.5–5.2)
Alkaline Phosphatase: 60 U/L (ref 39–117)
BUN: 14 mg/dL (ref 6–23)
CALCIUM: 9.3 mg/dL (ref 8.4–10.5)
CHLORIDE: 108 mmol/L (ref 96–112)
CO2: 31 mmol/L (ref 19–32)
Creatinine, Ser: 1.04 mg/dL (ref 0.50–1.35)
GFR calc non Af Amer: 70 mL/min — ABNORMAL LOW (ref >90)
GFR, EST AFRICAN AMERICAN: 81 mL/min — AB (ref >90)
GLUCOSE: 84 mg/dL (ref 70–99)
Potassium: 4.1 mmol/L (ref 3.5–5.1)
Sodium: 142 mmol/L (ref 135–145)
Total Bilirubin: 0.7 mg/dL (ref 0.3–1.2)
Total Protein: 6.5 g/dL (ref 6.0–8.3)

## 2014-07-11 LAB — I-STAT TROPONIN, ED: Troponin i, poc: 0 ng/mL (ref 0.00–0.08)

## 2014-07-11 LAB — PROTIME-INR
INR: 1.96 — ABNORMAL HIGH (ref 0.00–1.49)
PROTHROMBIN TIME: 22.5 s — AB (ref 11.6–15.2)

## 2014-07-11 MED ORDER — IOHEXOL 350 MG/ML SOLN
100.0000 mL | Freq: Once | INTRAVENOUS | Status: AC | PRN
  Administered 2014-07-11: 100 mL via INTRAVENOUS

## 2014-07-11 NOTE — ED Provider Notes (Signed)
Patient presented to the ER with left-sided chest discomfort. Patient has a very slight sensation on the left side of his chest that has been present for 3 days. He describes it as feeling like something is touching his skin. There is no shortness of breath. Patient does work out extensively, he has not had any worsening of his symptoms with exertion. Symptoms have been present for 3 days without change. No alleviating or exacerbating factors.  Face to face Exam: HEENT - PERRLA Lungs - CTAB Heart - RRR, no M/R/G Abd - S/NT/ND Neuro - alert, oriented x3  Plan:  Patient has very minor symptoms of discomfort on the left side of his chest. No overlying skin lesions. Lungs are clear. He does have a history of PE, takes Coumadin. INR just slightly subtherapeutic. PE study negative, however. EKG and troponin negative. As the symptoms have been present for 3 days straight, this is not acute coronary syndrome. Symptoms very atypical, did not change when he works out. Patient appropriate for discharge, follow-up with primary doctor.  Orpah Greek, MD 07/11/14 1447

## 2014-07-11 NOTE — ED Notes (Signed)
Pt presents to department for evaluation of L sided chest pain, onset Tuesday. Reports constant pain, 1/10 pain upon arrival. Respirations unlabored. Pt is alert and oriented x4.

## 2014-07-11 NOTE — ED Provider Notes (Signed)
CSN: [id number]     Arrival date & time 07/11/14  0093 History   First MD Initiated Contact with Patient 07/11/14 1032     Chief Complaint  Patient presents with  . Chest Pain     (Consider location/radiation/quality/duration/timing/severity/associated sxs/prior Treatment) Patient is a 74 y.o. male presenting with chest pain. The history is provided by the patient and medical records.  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 Patient states symptoms began on Tuesday. He states it is very mild in nature, but has been constant. States pain is currently a 1 out of 10 but "i can tell that it is there". No radiation into arms or neck. No associated shortness of breath, diaphoresis, nausea, vomiting, dizziness, or lightheadedness.  Patient has been evaluated by cardiology in the past but does not follow with them regularly. He states he saw Dr. 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  Patient states he has continued exercising regularly, mostly swimming, states no increase of pain with exercise.  Past Medical History  Diagnosis Date  . Pulmonary embolism   . Clotting disorder   . Asthma     hx of as child  . Bronchitis     as a child  . Pituitary tumor     takes medication to manage  . Hypertension     sees [name], primary   . Elevated PSA     being monitored by physician  . Cataract   . 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 Glaucoma 10/13/2011  . 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 Chronic anticoagulation 06/11/2013  . MI (myocardial infarction)    Past Surgical History  Procedure Laterality Date  . Knee surgery  1962    left   . Hernia repair  1960  . Tonsillectomy      as a child  . Eye surgery      bilateral cataract surgery  . Inguinal hernia repair  08/12/2011    Procedure: LAPAROSCOPIC INGUINAL HERNIA;  Surgeon: [name], MD;  Location: [facility];  Service: General;  Laterality: Left;  . Left heart catheterization with coronary angiogram N/A 05/06/2014    Procedure: LEFT HEART CATHETERIZATION WITH CORONARY ANGIOGRAM;  Surgeon: [name], MD;  Location: [facility] CATH LAB;  Service: Cardiovascular;  Laterality: N/A;   Family History  Problem Relation Age of Onset  . Pulmonary embolism Mother   . Pulmonary embolism Father   . Pulmonary embolism Maternal Grandmother   . Pulmonary embolism Maternal Grandfather   . Pulmonary embolism Paternal Grandmother   . Pulmonary embolism Paternal Grandfather    History  Substance Use Topics  . Smoking status: Former Smoker -- 0.30 packs/day for 5 years    Types: Cigarettes    Quit date: 06/13/1970  . Smokeless tobacco: Never Used  . Alcohol Use: Yes     Comment: very little    Review of Systems  Cardiovascular: Positive for chest  pain.  All other systems reviewed and are negative.     Allergies  Review of patient's allergies indicates no known allergies.  Home Medications   Prior to Admission medications   Medication Sig Start Date End Date Taking? Authorizing Provider  Ascorbic Acid (VITAMIN C) 1000 MG tablet Take 1,000 mg by mouth daily.    Historical Provider, MD  bromocriptine (PARLODEL) 5 MG capsule Take 2.5 mg by mouth daily.     Historical Provider, MD  captopril (CAPOTEN) 25 MG tablet Take 12.5 mg by mouth 2 (two) times daily.     Historical Provider, MD  Cholecalciferol (VITAMIN  D-3) 5000 UNITS TABS Take 1 tablet by mouth daily.    Historical Provider, MD  COLOSTRUM PO Take 1 capsule by mouth daily.    Historical Provider, MD  Copper Gluconate (COPPER CAPS) 2 MG CAPS Take 1 capsule by mouth daily.    Historical Provider, MD  l-methylfolate-B6-B12 (METANX) 3-35-2 MG TABS Take 1 tablet by mouth daily.    Historical Provider, MD  latanoprost (XALATAN) 0.005 % ophthalmic solution Place 1 drop into both eyes at bedtime.  09/14/10   Historical Provider, MD  Melatonin 3 MG TABS Take 3 mg by mouth at bedtime as needed (sleep).     Historical Provider, MD  Methylcobalamin 1 MG CHEW Chew 1 tablet by mouth daily.    Historical Provider, MD  metoprolol succinate (TOPROL XL) 25 MG 24 hr tablet Take 1 tablet (25 mg total) by mouth daily. 05/16/14   [name], MD  Multiple Vitamin (MULITIVITAMIN WITH MINERALS) TABS Take 1 tablet by mouth daily.    Historical Provider, MD  Pregnenolone Micronized POWD Take 1 capsule by mouth daily.    Historical Provider, MD  Probiotic Product (PROBIOTIC PO) Take 1 tablet by mouth daily.    Historical Provider, MD  vardenafil (LEVITRA) 10 MG tablet Take 10 mg by mouth daily as needed for erectile dysfunction.    Historical Provider, MD  warfarin (COUMADIN) 5 MG tablet Take 5 mg by mouth daily.  03/06/12   Annia Belt, MD  Zinc 50 MG TABS Take 1 tablet by mouth daily.    Historical Provider, MD   BP 146/79 mmHg  Pulse 56  Temp(Src) 97.6 F (36.4 C) (Oral)  Resp 16  SpO2 99%   Physical Exam  Constitutional: He is oriented to person, place, and time. He appears well-developed and well-nourished. No distress.  HENT:  Head: Normocephalic and atraumatic.  Mouth/Throat: Oropharynx is clear and moist.  Eyes: Conjunctivae and EOM are normal. Pupils are equal, round, and reactive to light.  Neck: Normal range of motion. Neck supple.  Cardiovascular: Normal rate, regular rhythm and normal heart sounds.   Pulmonary/Chest: Effort normal and breath  sounds normal. No respiratory distress. He has no wheezes.  Abdominal: Soft. Bowel sounds are normal. There is no tenderness. There is no guarding.  Musculoskeletal: Normal range of motion. He exhibits no edema.  No calf asymmetry, tenderness, or palpable cords  Neurological: He is alert and oriented to person, place, and time.  Skin: Skin is warm and dry. He is not diaphoretic.  Psychiatric: He has a normal mood and affect.  Nursing note and vitals reviewed.   ED Course  Procedures (including critical care time) Labs Review Labs Reviewed  CBC WITH DIFFERENTIAL/PLATELET - Abnormal; Notable for the following:    Platelets 140 (*)    Eosinophils Relative 6 (*)    All other components within normal limits  COMPREHENSIVE  METABOLIC PANEL - Abnormal; Notable for the following:    GFR calc non Af Amer 70 (*)    GFR calc Af Amer 81 (*)    Anion gap 3 (*)    All other components within normal limits  PROTIME-INR - Abnormal; Notable for the following:    Prothrombin Time 22.5 (*)    INR 1.96 (*)    All other components within normal limits  I-STAT TROPOININ, ED    Imaging Review Dg Chest 2 View  07/11/2014   CLINICAL DATA:  LEFT chest pain starting Tuesday, history pulmonary embolism, hypertension, asthma, clotting disorder, bronchitis, former smoker, prior MI  EXAM: CHEST  2 VIEW  COMPARISON:  09/11/2011; correlation CTA chest 06/26/2012  FINDINGS: Normal heart size, mediastinal contours and pulmonary vascularity.  Emphysematous changes with scarring at RIGHT middle lobe and lingula.  No definite acute infiltrate, pleural effusion or pneumothorax.  Questionable nipple shadows, unable to exclude basilar lung nodules particularly adjacent to LEFT heart border.  EKG leads project over upper lobes bilaterally.  Atherosclerotic calcification aorta.  No acute osseous findings.  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  Question BILATERAL nipple shadows; repeat PA chest  radiograph with nipple markers recommended to exclude pulmonary nodule.   Electronically Signed   By: Lavonia Dana M.D.   On: 07/11/2014 11:10   Ct Angio Chest Pe W/cm &/or Wo Cm  07/11/2014   CLINICAL DATA:  Left-sided chest pain since Tuesday.  EXAM: CT ANGIOGRAPHY CHEST WITH CONTRAST  TECHNIQUE: Multidetector CT imaging of the chest was performed using the standard protocol during bolus administration of intravenous contrast. Multiplanar CT image reconstructions and MIPs were obtained to evaluate the vascular anatomy.  CONTRAST:  166mL OMNIPAQUE IOHEXOL 350 MG/ML SOLN  COMPARISON:  06/26/2012  FINDINGS: Chest wall: No chest wall mass, supraclavicular or axillary adenopathy. The thyroid gland is grossly normal. The bony thorax is intact.  Mediastinal: The heart is normal in size. No pericardial effusion. No mediastinal or hilar mass or adenopathy. There are several calcified mediastinal and hilar lymph nodes consistent with remote granulomatous disease. Moderate aortic calcifications but no aneurysm or dissection.  The pulmonary arterial tree: Good opacification. No filling defects to suggest pulmonary embolism.  Lungs: No acute pulmonary findings. No worrisome pulmonary lesions. Patchy areas of subsegmental atelectasis. No pleural effusion  Upper abdomen: Numerous calcified hepatic and splenic granulomas. There is a large cyst associated with the right kidney.  Review of the MIP images confirms the above findings.  IMPRESSION: No CT findings for pulmonary embolism  Evidence of remote granulomatous disease with calcified mediastinal and hilar lymph nodes and calcified granulomas in the liver and spleen  No acute pulmonary findings.  Atherosclerotic calcifications involving the aorta but no aneurysm.   Electronically Signed   By: Kalman Jewels M.D.   On: 07/11/2014 13:55     EKG Interpretation   Date/Time:  Friday July 11 2014 10:03:41 EST Ventricular Rate:  58 PR Interval:  184 QRS Duration: 86 QT  Interval:  442 QTC Calculation: 433 R Axis:   66 Text Interpretation:  Sinus bradycardia Otherwise normal ECG Confirmed by  POLLINA  MD, CHRISTOPHER [phone]) on 07/11/2014 2:58:45 PM      MDM   Final diagnoses:  Chest pain, unspecified chest pain type   74 year old male with minor left-sided chest pain ongoing for the past 3 days. He does have history of DVT and PE currently on Coumadin. Patient afebrile and nontoxic in appearance. There is no  clinical signs of DVT on exam. EKG is reassuring.  Will obtain labs, troponin, chest x-ray.  Labs as above, minimally subtherapeutic INR at 1.96. Troponin negative.  Chest x-ray with questionable nipple shadow versus nodule. Given patient's somewhat low INR, CTA of chest was obtained to r/o nodule and PE-- negative for both. Given 3 days of ongoing minor symptoms, low suspicion for ACS, PE, dissection, or other acute cardiac event at this time.  Patient will be d/c home to follow up with his PCP.  Discussed plan with patient, he/she acknowledged understanding and agreed with plan of care.  Return precautions given for new or worsening symptoms.  Case discussed with attending physician, Dr. Betsey Holiday, who evaluated patient and agrees with plan of care.  Larene Pickett, PA-C 07/11/14 [place], MD 07/11/14 1501

## 2014-07-11 NOTE — Discharge Instructions (Signed)
Your work-up today was negative. Follow-up with your primary care physician. Return to the ED for new concerns.

## 2014-07-11 NOTE — ED Notes (Signed)
Patient transported to CT 

## 2014-07-11 NOTE — Telephone Encounter (Signed)
Calling stating he has been having constant chest pain since Tuesday.  States he has been swimming and working out but symptoms do not increase.  States he did not start taking the Toprol that Dr. Marlou Porch recommended because he has had "bad" experience in past.  Would not elaborate.  Spoke w/Chris Berge,NP who advises for him to go to ER to r/o out CAD, PE . Pt understands and will comply. Notified Trish of pt going to ER.

## 2014-07-11 NOTE — Telephone Encounter (Signed)
New message      Pt c/o of Chest Pain: STAT if CP now or developed within 24 hours  1. Are you having CP right now? Having chest tightness  2. Are you experiencing any other symptoms (ex. SOB, nausea, vomiting, sweating)? no 3. How long have you been experiencing CP?  Had chest tightness since tues  4. Is your CP continuous or coming and going? continuous  5. Have you taken Nitroglycerin? no ?

## 2014-07-14 ENCOUNTER — Ambulatory Visit (INDEPENDENT_AMBULATORY_CARE_PROVIDER_SITE_OTHER): Payer: Medicare Other | Admitting: Oncology

## 2014-07-14 ENCOUNTER — Encounter: Payer: Self-pay | Admitting: Oncology

## 2014-07-14 VITALS — BP 146/77 | HR 62 | Temp 97.5°F | Wt 189.5 lb

## 2014-07-14 DIAGNOSIS — Z7901 Long term (current) use of anticoagulants: Secondary | ICD-10-CM

## 2014-07-14 DIAGNOSIS — R9439 Abnormal result of other cardiovascular function study: Secondary | ICD-10-CM

## 2014-07-14 DIAGNOSIS — I825Z2 Chronic embolism and thrombosis of unspecified deep veins of left distal lower extremity: Secondary | ICD-10-CM

## 2014-07-14 DIAGNOSIS — I2699 Other pulmonary embolism without acute cor pulmonale: Secondary | ICD-10-CM

## 2014-07-14 NOTE — Progress Notes (Signed)
Patient ID: Jay Day, male   DOB: 07/17/1940, 74 y.o.   MRN: [id number] Hematology and Oncology Follow Up Visit  Jay Day [id number] Jul 16, 1940 74 y.o. 07/14/2014 7:17 PM   Principle Diagnosis: Encounter Diagnoses  Name Primary?  . Pulmonary embolism   . DVT, lower extremity, distal, chronic, left   . Chronic anticoagulation Yes  . Abnormal stress test    Clinical summary:  74 year old man with a history of recurrent idiopathic pulmonary emboli. Initial event occurred in March 2012. Venous Doppler study showed duration undetermined evidence of a left DVT that was clinically asymptomatic. He stayed on Coumadin through January of 2013. He had a laparoscopic inguinal hernia repair 08/12/2011. He again developed dyspnea on exertion and a repeat CT angiogram 09/19/2011 showed recurrent right pulmonary emboli which appeared to be acute. He was put back on anticoagulation. A hypercoagulation evaluation was unrevealing for any obvious risk factors. However of possible significance is the fact that he has calcified granulomas in mediastinal lymph nodes, liver, and spleen, and he has a borderline elevation of his ACE level (although he is on a ACE inhibitor for hypertension). 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 One wonders if there is a granuloma in his pituitary gland. Taken together, it is possible that he has underlying sarcoidosis. Chronic inflammation related to the sarcoid may be a risk factor for his thrombotic events.   Interim History:  He remains stable on Coumadin with current dose 5 mg daily. He has had no interim medical problems. No hematochezia or melena. No hematuria. He denies any dyspnea, chest pain, palpitations, leg swelling or pain. He continues to work full-time. He swims about 3 times a week.  Medications: reviewed  Allergies: No Known Allergies  Review of Systems: See history of present illness  Remaining ROS negative:    Physical Exam: Blood pressure 146/77, pulse 62, temperature 97.5 F (36.4 C), temperature source Oral, weight 189 lb 8 oz (85.957 kg), SpO2 100 %. Wt Readings from Last 3 Encounters:  07/14/14 189 lb 8 oz (85.957 kg)  05/16/14 190 lb 12 oz (86.524 kg)  05/06/14 185 lb (83.915 kg)     General appearance: Well-nourished Caucasian man Jay Day: Pharynx no erythema, exudate, mass, or ulcer. No thyromegaly or thyroid nodules Lymph nodes: No cervical, supraclavicular, or axillary lymphadenopathy Breasts:  Lungs: Clear to auscultation, resonant to percussion throughout Heart: Regular rhythm, no murmur, no gallop, no rub, no click, no edema Abdomen: Soft, nontender, normal bowel sounds, no mass, no organomegaly Extremities: No edema, no calf tenderness Musculoskeletal: no joint deformities GU:  Vascular: Carotid pulses 2+, no bruits,  Neurologic: Alert, oriented, PERRLA, , cranial nerves grossly normal, motor strength 5 over 5, reflexes 1+ symmetric, upper body coordination normal, gait normal, Skin: No rash or ecchymosis  Lab Results: CBC W/Diff    Component Value Date/Time   WBC 5.0 07/11/2014 1020   WBC 6.1 06/11/2013 1338   RBC 4.38 07/11/2014 1020   RBC 4.40 06/11/2013 1338   HGB 14.8 07/11/2014 1020   HGB 14.6 06/11/2013 1338   HCT 42.0 07/11/2014 1020   HCT 41.7 06/11/2013 1338   PLT 140* 07/11/2014 1020   PLT 154 06/11/2013 1338   MCV 95.9 07/11/2014 1020   MCV 94.8 06/11/2013 1338   MCH 33.8 07/11/2014 1020   MCH 33.2 06/11/2013 1338   MCHC 35.2 07/11/2014 1020   MCHC 35.0 06/11/2013 1338   RDW 12.3 07/11/2014 1020   RDW 12.2 06/11/2013 1338  LYMPHSABS 1.3 07/11/2014 1020   LYMPHSABS 1.2 06/11/2013 1338   MONOABS 0.5 07/11/2014 1020   MONOABS 0.5 06/11/2013 1338   EOSABS 0.3 07/11/2014 1020   EOSABS 0.3 06/11/2013 1338   BASOSABS 0.0 07/11/2014 1020   BASOSABS 0.0 06/11/2013 1338     Chemistry      Component Value Date/Time   NA 142 07/11/2014 1020   K 4.1  07/11/2014 1020   CL 108 07/11/2014 1020   CO2 31 07/11/2014 1020   BUN 14 07/11/2014 1020   CREATININE 1.04 07/11/2014 1020      Component Value Date/Time   CALCIUM 9.3 07/11/2014 1020   ALKPHOS 60 07/11/2014 1020   AST 33 07/11/2014 1020   ALT 18 07/11/2014 1020   BILITOT 0.7 07/11/2014 1020       Radiological Studies: Dg Chest 2 View  07/11/2014   CLINICAL DATA:  LEFT chest pain starting Tuesday, history pulmonary embolism, hypertension, asthma, clotting disorder, bronchitis, former smoker, prior MI  EXAM: CHEST  2 VIEW  COMPARISON:  09/11/2011; correlation CTA chest 06/26/2012  FINDINGS: Normal heart size, mediastinal contours and pulmonary vascularity.  Emphysematous changes with scarring at RIGHT middle lobe and lingula.  No definite acute infiltrate, pleural effusion or pneumothorax.  Questionable nipple shadows, unable to exclude basilar lung nodules particularly adjacent to LEFT heart border.  EKG leads project over upper lobes bilaterally.  Atherosclerotic calcification aorta.  No acute osseous findings.  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  Question BILATERAL nipple shadows; repeat PA chest radiograph with nipple markers recommended to exclude pulmonary nodule.   Electronically Signed   By: Lavonia Dana M.D.   On: 07/11/2014 11:10   Ct Angio Chest Pe W/cm &/or Wo Cm  07/11/2014   CLINICAL DATA:  Left-sided chest pain since Tuesday.  EXAM: CT ANGIOGRAPHY CHEST WITH CONTRAST  TECHNIQUE: Multidetector CT imaging of the chest was performed using the standard protocol during bolus administration of intravenous contrast. Multiplanar CT image reconstructions and MIPs were obtained to evaluate the vascular anatomy.  CONTRAST:  169mL OMNIPAQUE IOHEXOL 350 MG/ML SOLN  COMPARISON:  06/26/2012  FINDINGS: Chest wall: No chest wall mass, supraclavicular or axillary adenopathy. The thyroid gland is grossly normal. The bony thorax is intact.  Mediastinal: The heart is  normal in size. No pericardial effusion. No mediastinal or hilar mass or adenopathy. There are several calcified mediastinal and hilar lymph nodes consistent with remote granulomatous disease. Moderate aortic calcifications but no aneurysm or dissection.  The pulmonary arterial tree: Good opacification. No filling defects to suggest pulmonary embolism.  Lungs: No acute pulmonary findings. No worrisome pulmonary lesions. Patchy areas of subsegmental atelectasis. No pleural effusion  Upper abdomen: Numerous calcified hepatic and splenic granulomas. There is a large cyst associated with the right kidney.  Review of the MIP images confirms the above findings.  IMPRESSION: No CT findings for pulmonary embolism  Evidence of remote granulomatous disease with calcified mediastinal and hilar lymph nodes and calcified granulomas in the liver and spleen  No acute pulmonary findings.  Atherosclerotic calcifications involving the aorta but no aneurysm.   Electronically Signed   By: Kalman Jewels M.D.   On: 07/11/2014 13:55    Impression:  #1. 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 We again discussed potential change to one of the new oral anticoagulants. He is still reluctant to do this and I have no problem with him staying on Coumadin. He  is an intelligent, compliant, person.   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 Calcified granulomas and mediastinal lymph nodes, liver, and spleen, borderline elevation of ACE inhibitor. Rule out sarcoidosis   #4. Essential hypertension on an ACE inhibitor.  CC: Patient Care Team: [name], MD as PCP - General (Internal Medicine) Arnetha Gula, MD (Family Medicine)   Annia Belt, MD 2/1/20167:17 PM

## 2014-07-14 NOTE — Patient Instructions (Signed)
Return visit 1 year  Lab 1 week before visit

## 2014-08-15 ENCOUNTER — Encounter: Payer: Self-pay | Admitting: Cardiology

## 2014-08-15 ENCOUNTER — Ambulatory Visit (INDEPENDENT_AMBULATORY_CARE_PROVIDER_SITE_OTHER): Payer: Medicare Other | Admitting: Cardiology

## 2014-08-15 ENCOUNTER — Other Ambulatory Visit: Payer: Self-pay | Admitting: *Deleted

## 2014-08-15 VITALS — BP 112/78 | HR 63 | Ht 73.0 in | Wt 190.0 lb

## 2014-08-15 DIAGNOSIS — I2510 Atherosclerotic heart disease of native coronary artery without angina pectoris: Secondary | ICD-10-CM

## 2014-08-15 DIAGNOSIS — I700 Atherosclerosis of aorta: Secondary | ICD-10-CM

## 2014-08-15 DIAGNOSIS — Z86711 Personal history of pulmonary embolism: Secondary | ICD-10-CM

## 2014-08-15 DIAGNOSIS — I2584 Coronary atherosclerosis due to calcified coronary lesion: Secondary | ICD-10-CM

## 2014-08-15 DIAGNOSIS — Z7901 Long term (current) use of anticoagulants: Secondary | ICD-10-CM

## 2014-08-15 DIAGNOSIS — I825Z2 Chronic embolism and thrombosis of unspecified deep veins of left distal lower extremity: Secondary | ICD-10-CM

## 2014-08-15 NOTE — Patient Instructions (Signed)
The current medical regimen is effective;  continue present plan and medications.  Follow up in 1 year with Dr. Skains.  You will receive a letter in the mail 2 months before you are due.  Please call us when you receive this letter to schedule your follow up appointment.  Thank you for choosing [facility]!!     

## 2014-08-15 NOTE — Progress Notes (Signed)
[place]. [address], [address], [place]  [address] Phone: [phone] Fax:  [phone]  Date:  08/15/2014   ID:  Jay Day, DOB 1940-11-14, MRN [id number]  PCP:  [name], MD   History of Present Illness: Jay Day is a 74 y.o. male here for follow up of chest pain. On 05/06/14 he underwent cardiac catheterization that showed 30% mid LAD stenosis, minor, nonflow limiting with ejection fraction 45-50% with apical dyskinesis/aneurysmal segment. Continuation with medical management.   He then went to the emergency room on 07/11/14 with chest discomfort. 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 Had left-sided chest pain, mild but constant could tell that it was there. No radiation of symptoms. Troponin was negative. CT of chest was negative. He was discharged home.  Shortness of breath has decreased. Swimming. Treadmill. Doing well.   Has a past medical history of hypertension, pituitary adenoma followed by Dr. [name], pulmonary embolism in 2012 treated with Coumadin with recurrent PE in 2013, Coumadin restarted. Dr. [name]. LDL 107, HDL 52, creatinine 1.07, TSH 1.4.   Mild chest tightness was noted with mild dyspnea on exertion mainly with swimming or in the early morning hours after he gets up. He is relieved with rest.  EKG demonstrates sinus rhythm with PVC and no other ST segment changes. His pituitary adenoma, prolactin secreting has been very well controlled with low doses of bromocriptine.  His nuclear stress test as below was abnormal which resulted in cardiac catheterization that overall was reassuring.. Decreased endurance during swimming. See below.  Cath no flow limiting dz. LAD 30%, Apical akinesis. 45% EF.   Recent right ear, swimmer's ear. Took drops., Posterior neck skin lesion. He saw dermatology 2 years ago. Stated this was normal. Recommended repeat visit.  Wt Readings from Last 3 Encounters:  08/15/14 190 lb  (86.183 kg)  07/14/14 189 lb 8 oz (85.957 kg)  05/16/14 190 lb 12 oz (86.524 kg)     Past Medical History  Diagnosis Date  . Pulmonary embolism   . Clotting disorder   . Asthma     hx of as child  . Bronchitis     as a child  . Pituitary tumor     takes medication to manage  . Hypertension     sees [name], primary   . Elevated PSA     being monitored by physician  . Cataract   . 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 Glaucoma 10/13/2011  . 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 Chronic anticoagulation 06/11/2013  . MI (myocardial infarction)     Past Surgical History  Procedure Laterality Date  . Knee surgery  1962    left   . Hernia repair  1960  . Tonsillectomy      as a child  . Eye surgery      bilateral cataract surgery  . Inguinal hernia repair  08/12/2011    Procedure: LAPAROSCOPIC INGUINAL HERNIA;  Surgeon: [name], MD;  Location: [facility];  Service: General;  Laterality: Left;  . Left heart catheterization with coronary angiogram N/A 05/06/2014    Procedure: LEFT HEART CATHETERIZATION WITH CORONARY ANGIOGRAM;  Surgeon: [name], MD;  Location: [facility] CATH  LAB;  Service: Cardiovascular;  Laterality: N/A;    Current Outpatient Prescriptions  Medication Sig Dispense Refill  . Ascorbic Acid (VITAMIN C) 1000 MG tablet Take 1,000 mg by mouth daily.    . bromocriptine (PARLODEL) 5 MG capsule Take 2.5 mg by mouth daily.     . captopril (CAPOTEN) 25 MG tablet Take 12.5 mg by mouth 2 (two) times daily.     . Cholecalciferol (VITAMIN D-3) 5000 UNITS TABS Take 1 tablet by mouth daily.    . COLOSTRUM PO Take 1 capsule by mouth daily.    . Copper Gluconate (COPPER CAPS) 2 MG CAPS Take 1 capsule by mouth daily.    Marland Kitchen l-methylfolate-B6-B12 (METANX) 3-35-2 MG TABS Take  1 tablet by mouth daily.    Marland Kitchen latanoprost (XALATAN) 0.005 % ophthalmic solution Place 1 drop into both eyes at bedtime.     . Melatonin 3 MG TABS Take 3 mg by mouth at bedtime as needed (sleep).     . Methylcobalamin 1 MG CHEW Chew 1 tablet by mouth daily.    . metoprolol succinate (TOPROL XL) 25 MG 24 hr tablet Take 1 tablet (25 mg total) by mouth daily.    . Multiple Vitamin (MULITIVITAMIN WITH MINERALS) TABS Take 1 tablet by mouth daily.    . Pregnenolone Micronized POWD Take 1 capsule by mouth daily.    . Probiotic Product (PROBIOTIC PO) Take 1 tablet by mouth daily.    . vardenafil (LEVITRA) 10 MG tablet Take 10 mg by mouth daily as needed for erectile dysfunction.    Marland Kitchen warfarin (COUMADIN) 5 MG tablet Take 5 mg by mouth daily.     . Zinc 50 MG TABS Take 1 tablet by mouth daily.     No current facility-administered medications for this visit.    Allergies:   No Known Allergies  Social History:  The patient  reports that he quit smoking about 44 years ago. His smoking use included Cigarettes. He has a 1.5 pack-year smoking history. He has never used smokeless tobacco. He reports that he drinks alcohol. He reports that he does not use illicit drugs. 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 Drives frequently.  Family History  Problem Relation Age of Onset  . Pulmonary embolism Mother   . Pulmonary embolism Father   . Pulmonary embolism Maternal Grandmother   . Pulmonary embolism Maternal Grandfather   . Pulmonary embolism Paternal Grandmother   . Pulmonary embolism Paternal Grandfather     ROS:  Please see the history of present illness.   Denies any fevers, chills, orthopnea, PND, syncope, bleeding. Positive history of recurrent PE.   All other systems reviewed and negative.   PHYSICAL EXAM: VS:  BP 112/78 mmHg  Pulse 63  Ht 6\' 1"  (1.854 m)  Wt 190 lb (86.183 kg)  BMI 25.07 kg/m2 Well nourished, well developed, in no acute distress HEENT: normal, [place]/AT, EOMI Neck:  no JVD, normal carotid upstroke, no bruit Cardiac:  normal S1, S2; RRR; no murmur Lungs:  clear to auscultation bilaterally, no wheezing, rhonchi or rales Abd: soft, nontender, no hepatomegaly, no bruits Ext: no edema, 2+ distal pulses, 2+ radial Skin: warm and dry GU: deferred Neuro: no focal abnormalities noted, AAO x 3  EKG:  03/17/14-sinus bradycardia, PA-C, no other significant abnormalities 9/15-sinus rhythm, PVCs, PACs noted on rhythm strip, no ST segment changes.  CT scan of chest: 06/26/12-LAD calcification noted throughout proximal and midsection, aortic atherosclerosis noted as well.  Nuclear stress test 03/24/14-Intermediate risk  stress nuclear study with small-sized, severe intensity fixed apical defect consistent with scar. No reversible ischemia.  LV Ejection Fraction: 48%. LV Wall Motion: apical akinesis.  Prior lab work and clinic notes reviewed.     ASSESSMENT AND PLAN:  1. Chest pain-reassuring workup in emergency room. Minor 30% LAD, apical scar noted on LV gram. StartedToprol xl 25mg  but he states that he has not been taking this medication. We will not renew. Intermediate risk nuclear stress test with fixed apical defect, EF 48%. Continue ACE-I. 2. Coronary artery calcification/aortic atherosclerosis-seen on CT scan 06/26/12 which at that time was negative for pulmonary embolism recurrence. One could consider statin therapy. He is an advocate of gluten-free diet, anti-inflammatory diet. Paleo diet. His LDL is 107, quite reasonable. One could shoot for goals of less than 100 however given his coronary calcification. He is not interested in statin at this time.  3. History of pulmonary embolism-recurrent. Lifelong Coumadin. I have reviewed Dr. Colin Rhein note. Could consider Xarelto in future. Currently hesitant given lack of antidote. CT scans personally reviewed. Lifelong Coumadin. I discussed with him the coagulation cascade previously. 4. 12  month follow up.    Signed, [name], MD [facility]  08/15/2014 9:23 AM

## 2014-09-16 ENCOUNTER — Ambulatory Visit: Payer: Medicare Other | Admitting: Endocrinology

## 2014-10-13 ENCOUNTER — Ambulatory Visit (INDEPENDENT_AMBULATORY_CARE_PROVIDER_SITE_OTHER): Payer: Medicare Other | Admitting: Endocrinology

## 2014-10-13 ENCOUNTER — Encounter: Payer: Self-pay | Admitting: Endocrinology

## 2014-10-13 VITALS — BP 130/82 | HR 55 | Temp 98.0°F | Resp 14 | Ht 73.0 in | Wt 189.2 lb

## 2014-10-13 DIAGNOSIS — D352 Benign neoplasm of pituitary gland: Secondary | ICD-10-CM

## 2014-10-13 LAB — TESTOSTERONE: Testosterone: 618 ng/dL (ref 300.00–890.00)

## 2014-10-13 LAB — T4, FREE: Free T4: 0.86 ng/dL (ref 0.60–1.60)

## 2014-10-13 NOTE — Progress Notes (Signed)
Patient ID: Jay Day, male   DOB: 13-Oct-1940, 74 y.o.   MRN: [id number]          Chief complaint:  Pituitary tumor  History of Present Illness     Prior history: Apparently in 2003 he was being evaluated by his PCP for complaints of decreased libido. 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   He was treated by his internist with bromocriptine.  Follow-up MRI in 04/2012 did not show any change in size of the tumor   RECENT HISTORY: Patient's previous records were reviewed in detail including paper records from previous evaluation, paper records from his PCP and also MRI reports in the Epic system.  Patient has been treated with low doses of bromocriptine which he takes at night  Because of his relatively low reluctant to level with 2.5 mg of bromocriptine he was tried on half tablet in 2012 and prolactin was normal at 5.2 subsequently  In 5/13 his prolactin level was mildly increased at 21.4 and he was increased back to the full tablet  He had been taking 2.5 mg in the evening for some time but his prolactin level in 11/2011 was only 2.0 and follow-up in 7/14 was 1.9  His bromocriptine was continued unchanged.  With PCP in 01/2014 his prolactin level was low at 1.6  Recently on his own about a week or 2 ago he started taking half tablet 4 days a week and one tablet 3 days a week Has not had a prolactin level done since 8/15  He is now here for further follow-up and was last seen in 10/2011  He does not complain of any headaches, difficulties with peripheral vision, fatigue, nausea, weight loss.  Only occasionally may feel a little dizzy standing up.  He does take antihypertensives     Medication List       This list is accurate as of: 10/13/14  9:40 AM.  Always use your most  recent med list.               bromocriptine 5 MG capsule  Commonly known as:  PARLODEL  Take 2.5 mg by mouth daily.     bromocriptine 2.5 MG tablet  Commonly known as:  PARLODEL  2.5 mg. Takes 1 tablet 3 days a week and 1/2 tablet 4 days a week     captopril 25 MG tablet  Commonly known as:  CAPOTEN  Take 12.5 mg by mouth 2 (two) times daily.     COLOSTRUM PO  Take 1 capsule by mouth daily.     COPPER CAPS 2 MG Caps  Generic drug:  Copper Gluconate  Take 1 capsule by mouth daily.     l-methylfolate-B6-B12 3-35-2 MG Tabs  Commonly known as:  METANX  Take 1 tablet by mouth daily.     latanoprost 0.005 % ophthalmic solution  Commonly known as:  XALATAN  Place 1 drop into both eyes at bedtime.     Melatonin 3 MG Tabs  Take 3 mg by mouth at bedtime as needed (sleep).     Methylcobalamin 1 MG Chew  Chew 1 tablet by mouth daily.     multivitamin with minerals Tabs tablet  Take 1 tablet by mouth daily.     Pregnenolone Micronized  Powd  Take 1 capsule by mouth daily.     PROBIOTIC PO  Take 1 tablet by mouth daily.     vardenafil 10 MG tablet  Commonly known as:  LEVITRA  Take 10 mg by mouth daily as needed for erectile dysfunction.     vitamin C 1000 MG tablet  Take 1,000 mg by mouth daily.     Vitamin D-3 5000 UNITS Tabs  Take 1 tablet by mouth daily.     warfarin 5 MG tablet  Commonly known as:  COUMADIN  Take 5 mg by mouth daily.     Zinc 50 MG Tabs  Take 1 tablet by mouth daily.        Allergies: No Known Allergies  Past Medical History  Diagnosis Date  . Pulmonary embolism   . Clotting disorder   . Asthma     hx of as child  . Bronchitis     as a child  . Pituitary tumor     takes medication to manage  . Hypertension     sees [name], primary   . Elevated PSA     being monitored by physician  . Cataract   . 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 Glaucoma 10/13/2011  . 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 Chronic anticoagulation 06/11/2013  . MI (myocardial infarction)     Past Surgical History  Procedure Laterality Date  . Knee surgery  1962    left   . Hernia repair  1960  . Tonsillectomy      as a child  . Eye surgery      bilateral cataract surgery  . Inguinal hernia repair  08/12/2011    Procedure: LAPAROSCOPIC INGUINAL HERNIA;  Surgeon: [name], MD;  Location: [facility];  Service: General;  Laterality: Left;  . Left heart catheterization with coronary angiogram N/A 05/06/2014    Procedure: LEFT HEART CATHETERIZATION WITH CORONARY ANGIOGRAM;  Surgeon: [name], MD;  Location: [facility] CATH LAB;  Service: Cardiovascular;  Laterality: N/A;    Family History  Problem Relation Age of Onset  . Pulmonary embolism Mother   . Pulmonary embolism Father   . Pulmonary embolism Maternal Grandmother   . Pulmonary embolism Maternal Grandfather   . Pulmonary embolism Paternal Grandmother   . Pulmonary embolism Paternal Grandfather     Social History:  reports that he quit smoking about 44 years ago. His smoking use included Cigarettes. He has a 1.5 pack-year smoking history. He has never used smokeless tobacco. He reports that he drinks alcohol. He reports that he does not use illicit drugs.  ROS  No history of unusual headaches No history of blurred vision including peripheral vision History of hypertension:Taking captopril  No history of abnormal fasting glucose or diabetes He is followed by his PCP for history of pulmonary embolism treated with Coumadin  Notably he had episode of subacute thyroiditis in 2012 which resolved completely No complaints of shortness of breath, he is active and exercising with swimming regularly  General Examination:   BP 130/82 mmHg  Pulse 55  Temp(Src) 98 F (36.7 C)   Resp 14  Ht 6\' 1"  (1.854 m)  Wt 189 lb 3.2 oz (85.821 kg)  BMI 24.97 kg/m2  SpO2 97%  GENERAL APPEARANCE:  He looks well, no pallor SKIN:normal, no rash or pigmentation.  EYES:normal external appearance of eyes, Fundii benign.Peripheral vision normal by confrontation    EXTREMITIES:no clubbing, no edema.  NEUROLOGIC EXAM: Biceps and triceps  reflexes normal bilaterally,  Somewhat difficult to elicit.  No peripheral edema  Assessment/ Plan:  PITUITARY adenoma: Although he had a 1.4 cm pituitary tumor his initial prolactin level was only moderately increased at 99 Has been very well controlled with very low doses of bromocriptine Previously has been unable to get off the medication as prolactin tends to get higher with stopping the medication However with 2.5 mg of bromocriptine his prolactin level tends to be low Has not had a level done in 11/2013 On his own he has tried taking a half tablet 4 times a week and one tablet on the other 2 days recently and will need to see what his prolactin level comes out to be  Also need to reassess the rest of his pituitary function because of the history of macroadenoma: Will assess his growth hormone, free T4 and testosterone levels.  Consider supplementation of other hormonal deficits if they are abnormal Also may reconsider pituitary MRI if he has panhypopituitarism  Total visit time for today's office visit including evaluation and management, review of records and discussion with patient = 25 minutes   Jay Day 10/13/2014, 9:40 AM

## 2014-10-14 LAB — INSULIN-LIKE GROWTH FACTOR: Insulin-Like GF-1: 89 ng/mL (ref 41–179)

## 2014-10-14 LAB — PROLACTIN: Prolactin: 6.9 ng/mL (ref 4.0–15.2)

## 2014-10-14 NOTE — Progress Notes (Signed)
Quick Note:  Please let patient know that the all lab results are normal, prolactin 6.9 and can continue the same dosage of bromocriptine. Follow-up in 6 months with labs Send copies of labs to PCP ______

## 2014-10-28 ENCOUNTER — Telehealth: Payer: Self-pay | Admitting: Endocrinology

## 2014-10-28 NOTE — Telephone Encounter (Signed)
Please call in rx for the alternate of dostinex to [facility] at [address] no longer pays for other

## 2014-10-28 NOTE — Telephone Encounter (Signed)
He can change to Dostinex 0.5 mg, half tablet once a week However needs to have prolactin level done in one month

## 2014-10-28 NOTE — Telephone Encounter (Signed)
Please see below and advise.

## 2014-10-29 ENCOUNTER — Other Ambulatory Visit: Payer: Self-pay | Admitting: *Deleted

## 2014-10-29 MED ORDER — CABERGOLINE 0.5 MG PO TABS: 0.5000 mg | ORAL_TABLET | ORAL | Status: DC

## 2014-10-29 NOTE — Telephone Encounter (Signed)
Noted, patient is aware, rx sent 

## 2014-11-28 ENCOUNTER — Other Ambulatory Visit: Payer: Medicare Other

## 2014-11-28 ENCOUNTER — Other Ambulatory Visit: Payer: Self-pay | Admitting: *Deleted

## 2014-11-28 DIAGNOSIS — E237 Disorder of pituitary gland, unspecified: Secondary | ICD-10-CM

## 2014-12-01 ENCOUNTER — Other Ambulatory Visit: Payer: Self-pay | Admitting: *Deleted

## 2014-12-01 ENCOUNTER — Telehealth: Payer: Self-pay | Admitting: Endocrinology

## 2014-12-01 MED ORDER — CABERGOLINE 0.5 MG PO TABS: 0.5000 mg | ORAL_TABLET | ORAL | Status: DC

## 2014-12-01 NOTE — Telephone Encounter (Signed)
Please see below and advise how many to call in.

## 2014-12-01 NOTE — Telephone Encounter (Signed)
Same as previous prescription but no refills

## 2014-12-01 NOTE — Telephone Encounter (Signed)
Patient called and rescheduled his lab appointment  He states he will need to take the Cabergoline again  Please call this in today    Pharmacy: [facility]   Thank you

## 2014-12-01 NOTE — Telephone Encounter (Signed)
Error

## 2014-12-01 NOTE — Telephone Encounter (Signed)
rx sent

## 2014-12-05 ENCOUNTER — Other Ambulatory Visit: Payer: Medicare Other

## 2014-12-05 DIAGNOSIS — E237 Disorder of pituitary gland, unspecified: Secondary | ICD-10-CM

## 2014-12-06 LAB — PROLACTIN: Prolactin: 3 ng/mL (ref 2.1–17.1)

## 2014-12-09 ENCOUNTER — Telehealth: Payer: Self-pay | Admitting: *Deleted

## 2014-12-09 NOTE — Telephone Encounter (Signed)
Patient came into the office and wanted to know his test results.

## 2014-12-17 NOTE — Progress Notes (Signed)
Quick Note:  Please let patient know that the lab result is normal and no change in dosage needed ______ 

## 2014-12-18 ENCOUNTER — Telehealth: Payer: Self-pay | Admitting: *Deleted

## 2014-12-18 NOTE — Telephone Encounter (Signed)
Patient wants to know since his prolactin level is on the low side of normal does he still need to take the cabergoline? He read on [employer]  That prolactin has a function of blood clotting, he's on warfarin, he wants to know if his prolactin levels were a little bit higher would he need to be on warfarin? Please advise.

## 2014-12-18 NOTE — Telephone Encounter (Signed)
Prolactin will not affect his clotting.  Google is not a reliable source of information   If he is taking 1 tablet weekly he can reduce it to half a tablet until his follow-up

## 2014-12-18 NOTE — Telephone Encounter (Signed)
Noted, patient is aware. 

## 2015-02-02 ENCOUNTER — Other Ambulatory Visit: Payer: Self-pay

## 2015-02-02 MED ORDER — CABERGOLINE 0.5 MG PO TABS: 0.5000 mg | ORAL_TABLET | ORAL | Status: DC

## 2015-02-12 ENCOUNTER — Telehealth: Payer: Self-pay | Admitting: Endocrinology

## 2015-02-12 NOTE — Telephone Encounter (Signed)
Noted, patient is aware. 

## 2015-02-12 NOTE — Telephone Encounter (Signed)
Patient called stating that he has been having headaches for the past 10 weeks  Jay Day did consult with his PCP and they referred him back to Dr. [name]  He is concerned    Please advise patient

## 2015-02-12 NOTE — Telephone Encounter (Signed)
Headaches are not from the prolactin tumor.  He needs to see a neurologist if he has one otherwise go through his PCP for evaluation

## 2015-02-12 NOTE — Telephone Encounter (Signed)
Please see below and advise.

## 2015-02-24 ENCOUNTER — Ambulatory Visit (INDEPENDENT_AMBULATORY_CARE_PROVIDER_SITE_OTHER): Payer: Medicare Other | Admitting: Neurology

## 2015-02-24 ENCOUNTER — Encounter: Payer: Self-pay | Admitting: Neurology

## 2015-02-24 VITALS — BP 127/76 | HR 56 | Ht 73.0 in | Wt 188.5 lb

## 2015-02-24 DIAGNOSIS — R51 Headache: Secondary | ICD-10-CM | POA: Diagnosis not present

## 2015-02-24 DIAGNOSIS — Z5181 Encounter for therapeutic drug level monitoring: Secondary | ICD-10-CM | POA: Diagnosis not present

## 2015-02-24 DIAGNOSIS — R519 Headache, unspecified: Secondary | ICD-10-CM

## 2015-02-24 HISTORY — DX: Headache, unspecified: R51.9

## 2015-02-24 NOTE — Patient Instructions (Addendum)
We will check MRI of the brain and check blood work today. I will call with the results.   Headaches, Frequently Asked Questions MIGRAINE HEADACHES Q: What is migraine? What causes it? How can I treat it? A: Generally, migraine headaches begin as a dull ache. Then they develop into a constant, throbbing, and pulsating pain. You may experience pain at the temples. You may experience pain at the front or back of one or both sides of the head. The pain is usually accompanied by a combination of:  Nausea.  Vomiting.  Sensitivity to light and noise. Some people (about 15%) experience an aura (see below) before an attack. The cause of migraine is believed to be chemical reactions in the brain. Treatment for migraine may include over-the-counter or prescription medications. It may also include self-help techniques. These include relaxation training and biofeedback.  Q: What is an aura? A: About 15% of people with migraine get an "aura". This is a sign of neurological symptoms that occur before a migraine headache. You may see wavy or jagged lines, dots, or flashing lights. You might experience tunnel vision or blind spots in one or both eyes. The aura can include visual or auditory hallucinations (something imagined). It may include disruptions in smell (such as strange odors), taste or touch. Other symptoms include:  Numbness.  A "pins and needles" sensation.  Difficulty in recalling or speaking the correct word. These neurological events may last as long as 60 minutes. These symptoms will fade as the headache begins. Q: What is a trigger? A: Certain physical or environmental factors can lead to or "trigger" a migraine. These include:  Foods.  Hormonal changes.  Weather.  Stress. It is important to remember that triggers are different for everyone. To help prevent migraine attacks, you need to figure out which triggers affect you. Keep a headache diary. This is a good way to track  triggers. The diary will help you talk to your healthcare professional about your condition. Q: Does weather affect migraines? A: Bright sunshine, hot, humid conditions, and drastic changes in barometric pressure may lead to, or "trigger," a migraine attack in some people. But studies have shown that weather does not act as a trigger for everyone with migraines. Q: What is the link between migraine and hormones? A: Hormones start and regulate many of your body's functions. Hormones keep your body in balance within a constantly changing environment. The levels of hormones in your body are unbalanced at times. Examples are during menstruation, pregnancy, or menopause. That can lead to a migraine attack. In fact, about three quarters of all women with migraine report that their attacks are related to the menstrual cycle.  Q: Is there an increased risk of stroke for migraine sufferers? A: The likelihood of a migraine attack causing a stroke is very remote. That is not to say that migraine sufferers cannot have a stroke associated with their migraines. In persons under age 17, the most common associated factor for stroke is migraine headache. But over the course of a person's normal life span, the occurrence of migraine headache may actually be associated with a reduced risk of dying from cerebrovascular disease due to stroke.  Q: What are acute medications for migraine? A: Acute medications are used to treat the pain of the headache after it has started. Examples over-the-counter medications, NSAIDs, ergots, and triptans.  Q: What are the triptans? A: Triptans are the newest class of abortive medications. They are specifically targeted to treat migraine.  Triptans are vasoconstrictors. They moderate some chemical reactions in the brain. The triptans work on receptors in your brain. Triptans help to restore the balance of a neurotransmitter called serotonin. Fluctuations in levels of serotonin are thought to be  a main cause of migraine.  Q: Are over-the-counter medications for migraine effective? A: Over-the-counter, or "OTC," medications may be effective in relieving mild to moderate pain and associated symptoms of migraine. But you should see your caregiver before beginning any treatment regimen for migraine.  Q: What are preventive medications for migraine? A: Preventive medications for migraine are sometimes referred to as "prophylactic" treatments. They are used to reduce the frequency, severity, and length of migraine attacks. Examples of preventive medications include antiepileptic medications, antidepressants, beta-blockers, calcium channel blockers, and NSAIDs (nonsteroidal anti-inflammatory drugs). Q: Why are anticonvulsants used to treat migraine? A: During the past few years, there has been an increased interest in antiepileptic drugs for the prevention of migraine. They are sometimes referred to as "anticonvulsants". Both epilepsy and migraine may be caused by similar reactions in the brain.  Q: Why are antidepressants used to treat migraine? A: Antidepressants are typically used to treat people with depression. They may reduce migraine frequency by regulating chemical levels, such as serotonin, in the brain.  Q: What alternative therapies are used to treat migraine? A: The term "alternative therapies" is often used to describe treatments considered outside the scope of conventional Western medicine. Examples of alternative therapy include acupuncture, acupressure, and yoga. Another common alternative treatment is herbal therapy. Some herbs are believed to relieve headache pain. Always discuss alternative therapies with your caregiver before proceeding. Some herbal products contain arsenic and other toxins. TENSION HEADACHES Q: What is a tension-type headache? What causes it? How can I treat it? A: Tension-type headaches occur randomly. They are often the result of temporary stress, anxiety,  fatigue, or anger. Symptoms include soreness in your temples, a tightening band-like sensation around your head (a "vice-like" ache). Symptoms can also include a pulling feeling, pressure sensations, and contracting head and neck muscles. The headache begins in your forehead, temples, or the back of your head and neck. Treatment for tension-type headache may include over-the-counter or prescription medications. Treatment may also include self-help techniques such as relaxation training and biofeedback. CLUSTER HEADACHES Q: What is a cluster headache? What causes it? How can I treat it? A: Cluster headache gets its name because the attacks come in groups. The pain arrives with little, if any, warning. It is usually on one side of the head. A tearing or bloodshot eye and a runny nose on the same side of the headache may also accompany the pain. Cluster headaches are believed to be caused by chemical reactions in the brain. They have been described as the most severe and intense of any headache type. Treatment for cluster headache includes prescription medication and oxygen. SINUS HEADACHES Q: What is a sinus headache? What causes it? How can I treat it? A: When a cavity in the bones of the face and skull (a sinus) becomes inflamed, the inflammation will cause localized pain. This condition is usually the result of an allergic reaction, a tumor, or an infection. If your headache is caused by a sinus blockage, such as an infection, you will probably have a fever. An x-ray will confirm a sinus blockage. Your caregiver's treatment might include antibiotics for the infection, as well as antihistamines or decongestants.  REBOUND HEADACHES Q: What is a rebound headache? What causes it? How can I  treat it? A: A pattern of taking acute headache medications too often can lead to a condition known as "rebound headache." A pattern of taking too much headache medication includes taking it more than 2 days per week or in  excessive amounts. That means more than the label or a caregiver advises. With rebound headaches, your medications not only stop relieving pain, they actually begin to cause headaches. Doctors treat rebound headache by tapering the medication that is being overused. Sometimes your caregiver will gradually substitute a different type of treatment or medication. Stopping may be a challenge. Regularly overusing a medication increases the potential for serious side effects. Consult a caregiver if you regularly use headache medications more than 2 days per week or more than the label advises. ADDITIONAL QUESTIONS AND ANSWERS Q: What is biofeedback? A: Biofeedback is a self-help treatment. Biofeedback uses special equipment to monitor your body's involuntary physical responses. Biofeedback monitors:  Breathing.  Pulse.  Heart rate.  Temperature.  Muscle tension.  Brain activity. Biofeedback helps you refine and perfect your relaxation exercises. You learn to control the physical responses that are related to stress. Once the technique has been mastered, you do not need the equipment any more. Q: Are headaches hereditary? A: Four out of five (80%) of people that suffer report a family history of migraine. Scientists are not sure if this is genetic or a family predisposition. Despite the uncertainty, a child has a 50% chance of having migraine if one parent suffers. The child has a 75% chance if both parents suffer.  Q: Can children get headaches? A: By the time they reach high school, most young people have experienced some type of headache. Many safe and effective approaches or medications can prevent a headache from occurring or stop it after it has begun.  Q: What type of doctor should I see to diagnose and treat my headache? A: Start with your primary caregiver. Discuss his or her experience and approach to headaches. Discuss methods of classification, diagnosis, and treatment. Your caregiver may  decide to recommend you to a headache specialist, depending upon your symptoms or other physical conditions. Having diabetes, allergies, etc., may require a more comprehensive and inclusive approach to your headache. The National Head[employer] Foundation will provide, upon request, a list of [facility] physician members in your state. Document Released: 08/20/2003 Document Revised: 08/22/2011 Document Reviewed: 01/28/2008 [facility] Patient Information 2015 [place], [place]. This information is not intended to replace advice given to you by your health care provider. Make sure you discuss any questions you have with your health care provider.

## 2015-02-24 NOTE — Progress Notes (Signed)
Reason for visit: Headache  Referring physician: Dr. Doroteo Bradford is a 74 y.o. male  History of present illness:  Jay Day is a 74 year old left-handed white male with a history of recurrent DVT and pulmonary emboli, on chronic Coumadin therapy. 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 He has had excellent prolactin levels, and recently the bromocriptine dosing was lessened because of this. Ten weeks prior to this evaluation, he began having onset of right sided headaches that have been persistent since that time. The headaches are not severe, but are dull, and constant over the right temporal and parietal areas. He has no other associated symptoms of blurred vision, visual loss, numbness or weakness of the extremities or face, speech disturbance or difficulty with swallowing. He denies any issues with balance, or difficulty controlling the bowels or the bladder. He denies any significant neck discomfort with the headaches. He was told that his platelet level was slightly low. He does not take any medication for the headache. He is concerned about the cause of the headache. He comes to this office for further evaluation.  Past Medical History  Diagnosis Date  . Pulmonary embolism   . Clotting disorder   . Asthma     hx of as child  . Bronchitis     as a child  . Pituitary tumor     takes medication to manage  . Hypertension     sees [name], primary   . Elevated PSA     being monitored by physician  . Cataract   . 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 Glaucoma 10/13/2011  . 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 Chronic anticoagulation 06/11/2013  . MI (myocardial infarction)   .  Peyronie's disease   . Prostatitis   . Headache disorder 02/24/2015    Past Surgical History  Procedure Laterality Date  . Knee surgery  1962    left   . Hernia repair  1960  . Tonsillectomy      as a child  . Eye surgery      bilateral cataract surgery  . Inguinal hernia repair  08/12/2011    Procedure: LAPAROSCOPIC INGUINAL HERNIA;  Surgeon: [name], MD;  Location: [facility];  Service: General;  Laterality: Left;  . Left heart catheterization with coronary angiogram N/A 05/06/2014    Procedure: LEFT HEART CATHETERIZATION WITH CORONARY ANGIOGRAM;  Surgeon: [name], MD;  Location: [facility] CATH LAB;  Service: Cardiovascular;  Laterality: N/A;    Family History  Problem Relation Age of Onset  . Pulmonary embolism Mother   . Kidney failure Mother   . Hypertension Mother   . Kidney Stones Mother   . Pulmonary embolism Father   . Prostate cancer Father   . Pulmonary embolism Maternal Grandmother   . Pulmonary embolism Maternal Grandfather   . Pulmonary embolism Paternal Grandmother   . Pulmonary embolism Paternal Grandfather     Social history:  reports that he quit smoking about 44 years ago. His smoking use included Cigarettes. He has a 1.5 pack-year smoking history. He has never used smokeless tobacco. He reports that he drinks alcohol. He reports that he does not use  illicit drugs.  Medications:  Prior to Admission medications   Medication Sig Start Date End Date Taking? Authorizing Provider  bromocriptine (PARLODEL) 2.5 MG tablet 2.5 mg. 1/2 tablet twice weekly 09/07/14  Yes Historical Provider, MD  captopril (CAPOTEN) 25 MG tablet Take 12.5 mg by mouth 2 (two) times daily.    Yes Historical Provider, MD  metoprolol succinate (TOPROL-XL) 50 MG 24 hr tablet Take 25 mg by mouth daily. Take with or immediately following a meal.   Yes Historical Provider, MD  warfarin (COUMADIN) 5 MG tablet Take 5 mg by mouth daily.  03/06/12  Yes Annia Belt, MD     No Known  Allergies  ROS:  Out of a complete 14 system review of symptoms, the patient complains only of the following symptoms, and all other reviewed systems are negative.  Headache  Blood pressure 127/76, pulse 56, height 6\' 1"  (1.854 m), weight 188 lb 8 oz (85.503 kg).  Physical Exam  General: The patient is alert and cooperative at the time of the examination.  Eyes: Pupils are equal, round, and reactive to light. Discs are flat bilaterally.  Neck: The neck is supple, no carotid bruits are noted.  Respiratory: The respiratory examination is clear.  Cardiovascular: The cardiovascular examination reveals a regular rate and rhythm, no obvious murmurs or rubs are noted.  Skin: Extremities are without significant edema.  Neurologic Exam  Mental status: The patient is alert and oriented x 3 at the time of the examination. The patient has apparent normal recent and remote memory, with an apparently normal attention span and concentration ability.  Cranial nerves: Facial symmetry is present. There is good sensation of the face to pinprick and soft touch bilaterally. The strength of the facial muscles and the muscles to head turning and shoulder shrug are normal bilaterally. Speech is well enunciated, no aphasia or dysarthria is noted. Extraocular movements are full. Visual fields are full. The tongue is midline, and the patient has symmetric elevation of the soft palate. No obvious hearing deficits are noted.  Motor: The motor testing reveals 5 over 5 strength of all 4 extremities. Good symmetric motor tone is noted throughout.  Sensory: Sensory testing is intact to pinprick, soft touch, vibration sensation, and position sense on all 4 extremities. No evidence of extinction is noted.  Coordination: Cerebellar testing reveals good finger-nose-finger and heel-to-shin bilaterally.  Gait and station: Gait is normal. Tandem gait is normal. Romberg is negative. No drift is seen.  Reflexes: Deep  tendon reflexes are symmetric and normal bilaterally. Toes are downgoing bilaterally.   Assessment/Plan:  1. Right-sided headache  2. Chronic Coumadin therapy  3. Pituitary macroadenoma, prolactin secreting  The patient has had a new onset headache, no pre-existing history of headache is noted. The patient will require a thorough workup as to the etiology of the headache, we need to exclude the possibility of a growing pituitary macroadenoma, or a small subdural hematoma. He will be set up for MRI evaluation of the brain with and without gadolinium enhancement, blood work will be done today to include a sedimentation rate and CRP. He will follow-up in 3 months.  Jill Alexanders MD 02/24/2015 7:37 PM  [facility] [address] [place], [place] [address]  Phone [phone] Fax [phone]

## 2015-02-25 ENCOUNTER — Telehealth: Payer: Self-pay

## 2015-02-25 LAB — BASIC METABOLIC PANEL
BUN/Creatinine Ratio: 16 (ref 10–22)
BUN: 15 mg/dL (ref 8–27)
CALCIUM: 9.4 mg/dL (ref 8.6–10.2)
CO2: 26 mmol/L (ref 18–29)
CREATININE: 0.94 mg/dL (ref 0.76–1.27)
Chloride: 105 mmol/L (ref 97–108)
GFR, EST AFRICAN AMERICAN: 93 mL/min/{1.73_m2} (ref >59)
GFR, EST NON AFRICAN AMERICAN: 81 mL/min/{1.73_m2} (ref >59)
Glucose: 97 mg/dL (ref 65–99)
POTASSIUM: 4.1 mmol/L (ref 3.5–5.2)
SODIUM: 144 mmol/L (ref 134–144)

## 2015-02-25 LAB — C-REACTIVE PROTEIN: CRP: 2.4 mg/L (ref 0.0–4.9)

## 2015-02-25 LAB — SEDIMENTATION RATE: Sed Rate: 2 mm/hr (ref 0–30)

## 2015-02-25 NOTE — Telephone Encounter (Signed)
-----   Message from Kathrynn Ducking, MD sent at 02/25/2015 10:12 AM EDT -----  The blood work results are unremarkable. Please call the patient.  ----- Message -----    From: [facility] Lab Results In Interface    Sent: 02/25/2015   7:45 AM      To: Kathrynn Ducking, MD

## 2015-02-25 NOTE — Telephone Encounter (Signed)
I called the patient and relayed results. 

## 2015-02-26 ENCOUNTER — Telehealth: Payer: Self-pay | Admitting: Neurology

## 2015-02-26 NOTE — Telephone Encounter (Signed)
Patient called to inform Charisse Klinefelter he has MRI appt 02/27/15 (tomorrow) @ 1:45.

## 2015-02-27 ENCOUNTER — Ambulatory Visit
Admission: RE | Admit: 2015-02-27 | Discharge: 2015-02-27 | Disposition: A | Discharge status: Home / Self Care | Payer: Medicare Other | Source: Ambulatory Visit | Attending: Neurology | Admitting: Neurology

## 2015-02-27 DIAGNOSIS — R51 Headache: Principal | ICD-10-CM

## 2015-02-27 DIAGNOSIS — R519 Headache, unspecified: Secondary | ICD-10-CM

## 2015-03-06 ENCOUNTER — Other Ambulatory Visit: Payer: Medicare Other

## 2015-03-13 ENCOUNTER — Ambulatory Visit
Admission: RE | Admit: 2015-03-13 | Discharge: 2015-03-13 | Disposition: A | Discharge status: Home / Self Care | Payer: Medicare Other | Source: Ambulatory Visit | Attending: Neurology | Admitting: Neurology

## 2015-03-13 DIAGNOSIS — R51 Headache: Secondary | ICD-10-CM | POA: Diagnosis not present

## 2015-03-13 MED ORDER — GADOBENATE DIMEGLUMINE 529 MG/ML IV SOLN
17.0000 mL | Freq: Once | INTRAVENOUS | Status: AC | PRN
  Administered 2015-03-13: 17 mL via INTRAVENOUS

## 2015-03-16 ENCOUNTER — Telehealth: Payer: Self-pay | Admitting: Neurology

## 2015-03-16 NOTE — Telephone Encounter (Signed)
  I called patient. MRI the brain shows very minimal small vessel changes, mild chronic sinus issues. Nothing that would appear to be an etiology for his headaches. The headaches are significant, he is to contact our office. He is on Coumadin therapy.  MRI brain results 03/16/15:  IMPRESSION: This is an abnormal MRI of the brain with and without contrast showed the following: 1. The pituitary microadenoma noted in 02/26/2005 that extended into the right cavernous sinus is not noted on the current study. Dedicated pituitary MRI with thin sections would better assess this if clinically indicated. 2. 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 These changes do not appear to be acute.  3. Chronic sinusitis that has progressed mildly since 2006 is noted in the maxillary, ethmoid and frontal sinuses. 4. There are no acute findings.

## 2015-03-23 ENCOUNTER — Ambulatory Visit: Payer: Medicare Other | Admitting: Neurology

## 2015-03-30 ENCOUNTER — Ambulatory Visit: Payer: Medicare Other | Admitting: Neurology

## 2015-04-13 ENCOUNTER — Other Ambulatory Visit: Payer: Self-pay | Admitting: Endocrinology

## 2015-04-13 ENCOUNTER — Other Ambulatory Visit: Payer: Medicare Other

## 2015-04-13 DIAGNOSIS — D352 Benign neoplasm of pituitary gland: Secondary | ICD-10-CM

## 2015-04-14 LAB — PROLACTIN: PROLACTIN: 30.9 ng/mL — AB (ref 4.0–15.2)

## 2015-04-17 ENCOUNTER — Encounter: Payer: Self-pay | Admitting: Endocrinology

## 2015-04-17 ENCOUNTER — Ambulatory Visit: Payer: Medicare Other | Admitting: Endocrinology

## 2015-04-17 ENCOUNTER — Ambulatory Visit (INDEPENDENT_AMBULATORY_CARE_PROVIDER_SITE_OTHER): Payer: Medicare Other | Admitting: Endocrinology

## 2015-04-17 VITALS — BP 126/82 | HR 55 | Temp 98.3°F | Resp 14 | Ht 73.0 in | Wt 191.2 lb

## 2015-04-17 DIAGNOSIS — D352 Benign neoplasm of pituitary gland: Secondary | ICD-10-CM | POA: Diagnosis not present

## 2015-04-17 NOTE — Progress Notes (Signed)
Patient ID: Jay Day, male   DOB: 1940-12-31, 74 y.o.   MRN: [id number]          Chief complaint:  Pituitary tumor  History of Present Illness     Prior history: Apparently in 2003 he was being evaluated by his PCP for complaints of decreased libido. 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   He was treated by his internist with bromocriptine.  Follow-up MRI in 04/2012 did not show any change in size of the tumor   RECENT HISTORY: Patient's previous records were reviewed in detail including paper records from previous evaluation, paper records from his PCP and also MRI reports in the Epic system.  Patient has been treated with low doses of bromocriptine which he takes at night  Because of his relatively low reluctant to level with 2.5 mg of bromocriptine he was tried on half tablet in 2012 and prolactin was normal at 5.2 subsequently  In 5/13 his prolactin level was mildly increased at 21.4 and he was increased back to the full tablet  He had been taking 2.5 mg in the evening for some time but his prolactin level in 11/2011 was only 2.0, in 7/14 was 1.9 and in 01/2014 his prolactin level was low at 1.6  No evidence of hypopituitarism on his evaluation in 10/2014 MRI brain scan in 9/16 did not show any obvious pituitary tumor although this was not focused on the pituitary  On his last visit he was taking bromocriptine half tablet 4 days a week and one tablet 3 days a week and since his prolactin was normal at was continued unchanged However the last month or so he decided to stop the medication on his own but his prolactin level has gone up  He does not complain of any decreased libido, fatigue   He was having headaches still about a month ago which he thinks are better because of  blood pressure improvement  Lab Results  Component Value Date   PROLACTIN 30.9* 04/13/2015   PROLACTIN 3.0 12/05/2014   PROLACTIN 6.9 10/13/2014        Medication List       This list is accurate as of: 04/17/15 11:59 PM.  Always use your most recent med list.               bromocriptine 2.5 MG tablet  Commonly known as:  PARLODEL  2.5 mg. 1/2 tablet twice weekly     captopril 25 MG tablet  Commonly known as:  CAPOTEN  Take 12.5 mg by mouth 2 (two) times daily.     latanoprost 0.005 % ophthalmic solution  Commonly known as:  XALATAN  place 1 drop into both eyes at bedtime PLS. SCHEDULE AN APPOINTMENT BEFORE FUTURE REFILLS     metoprolol succinate 50 MG 24 hr tablet  Commonly known as:  TOPROL-XL  Take 25 mg by mouth daily. Take with or immediately following a meal.     warfarin 5 MG tablet  Commonly known as:  COUMADIN  Take 5 mg by mouth daily.        Allergies: No Known Allergies  Past Medical History  Diagnosis Date  . Pulmonary embolism ([place])   . Clotting disorder ([place])   . Asthma  hx of as child  . Bronchitis     as a child  . Pituitary tumor [facility])     takes medication to manage  . Hypertension     sees [name], primary   . Elevated PSA     being monitored by physician  . Cataract   . 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 Glaucoma 10/13/2011  . 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 Chronic anticoagulation 06/11/2013  . MI (myocardial infarction) ([place])   . Peyronie's disease   . Prostatitis   . Headache disorder 02/24/2015    Past Surgical History  Procedure Laterality Date  . Knee surgery  1962    left   . Hernia repair  1960  . Tonsillectomy      as a child  . Eye surgery      bilateral cataract surgery  . Inguinal  hernia repair  08/12/2011    Procedure: LAPAROSCOPIC INGUINAL HERNIA;  Surgeon: [name], MD;  Location: [place];  Service: General;  Laterality: Left;  . Left heart catheterization with coronary angiogram N/A 05/06/2014    Procedure: LEFT HEART CATHETERIZATION WITH CORONARY ANGIOGRAM;  Surgeon: [name], MD;  Location: [facility] CATH LAB;  Service: Cardiovascular;  Laterality: N/A;    Family History  Problem Relation Age of Onset  . Pulmonary embolism Mother   . Kidney failure Mother   . Hypertension Mother   . Kidney Stones Mother   . Pulmonary embolism Father   . Prostate cancer Father   . Pulmonary embolism Maternal Grandmother   . Pulmonary embolism Maternal Grandfather   . Pulmonary embolism Paternal Grandmother   . Pulmonary embolism Paternal Grandfather     Social History:  reports that he quit smoking about 44 years ago. His smoking use included Cigarettes. He has a 1.5 pack-year smoking history. He has never used smokeless tobacco. He reports that he drinks alcohol. He reports that he does not use illicit drugs.  ROS  He had headaches for 4 months but these seem to be better with his trying to use a low-sodium diet and improve his blood pressure   General Examination:   BP 126/82 mmHg  Pulse 55  Temp(Src) 98.3 F (36.8 C)  Resp 14  Ht 6\' 1"  (1.854 m)  Wt 191 lb 3.2 oz (86.728 kg)  BMI 25.23 kg/m2  SpO2 97%    Assessment/ Plan:  PITUITARY adenoma: Although he had a 1.4 cm pituitary tumor his initial prolactin level was only moderately increased at 99 Has been very well controlled with very low doses of bromocriptine Previously has been unable to get off the medication as prolactin tends to get higher with stopping the medication Previously with 2.5 mg of bromocriptine his prolactin level tends to be below normal With 1.25 mg alternating with 2.5 his prolactin level was down to 3.0  Without taking bromocriptine recently  prolactin level has gone up to  31 However his pituitary tumor was not seen on the brain MRI done recently  Since he is sensitive to the medication and he will try taking 1.25 mg 3 days a week and have another level checked in 6 weeks   Izel Hochberg 04/19/2015, 6:14 PM

## 2015-05-29 ENCOUNTER — Other Ambulatory Visit: Payer: Medicare Other

## 2015-07-14 ENCOUNTER — Ambulatory Visit (INDEPENDENT_AMBULATORY_CARE_PROVIDER_SITE_OTHER): Payer: Medicare Other | Admitting: Neurology

## 2015-07-14 ENCOUNTER — Encounter: Payer: Self-pay | Admitting: Neurology

## 2015-07-14 VITALS — BP 110/76 | HR 58 | Ht 73.0 in | Wt 189.0 lb

## 2015-07-14 DIAGNOSIS — R51 Headache: Secondary | ICD-10-CM | POA: Diagnosis not present

## 2015-07-14 DIAGNOSIS — R519 Headache, unspecified: Secondary | ICD-10-CM

## 2015-07-14 NOTE — Progress Notes (Signed)
Reason for visit: Headache  Jay Day is an 75 y.o. 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 The patient has done well on bromocriptine. He was seen and evaluated previously for headaches that began shortly after alterations in dosing of the bromocriptine. The patient had been switched to Cabergoline, but he seemed not to tolerate the switch, and went back to the bromocriptine. The patient had also altered his dosing of his blood pressure medication. The patient had MRI evaluation of the brain that showed that the pituitary adenoma had reduced in size. The prolactin levels have been low. The patient has noted that his headaches have disappeared once he has switched back to his usual doses of bromocriptine and his captopril. The patient is doing quite well at this time. He remains on Coumadin for recurring DVTs and pulmonary embolus.  Past Medical History  Diagnosis Date  . Pulmonary embolism ([place])   . Clotting disorder ([place])   . Asthma     hx of as child  . Bronchitis     as a child  . Pituitary tumor [facility])     takes medication to manage  . Hypertension     sees Jay Day, primary   . Elevated PSA     being monitored by physician  . Cataract   . 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 Glaucoma 10/13/2011  . 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 Chronic anticoagulation 06/11/2013  . MI (myocardial infarction) ([place])   . Peyronie's disease   . Prostatitis   . Headache disorder 02/24/2015    Past Surgical History  Procedure Laterality Date  . Knee surgery  1962    left   . Hernia repair  1960  . Tonsillectomy      as a child  . Eye surgery        bilateral cataract surgery  . Inguinal hernia repair  08/12/2011    Procedure: LAPAROSCOPIC INGUINAL HERNIA;  Surgeon: [name], MD;  Location: [place];  Service: General;  Laterality: Left;  . Left heart catheterization with coronary angiogram N/A 05/06/2014    Procedure: LEFT HEART CATHETERIZATION WITH CORONARY ANGIOGRAM;  Surgeon: [name], MD;  Location: [facility] CATH LAB;  Service: Cardiovascular;  Laterality: N/A;    Family History  Problem Relation Age of Onset  . Pulmonary embolism Mother   . Kidney failure Mother   . Hypertension Mother   . Kidney Stones Mother   . Pulmonary embolism Father   . Prostate cancer Father   . Pulmonary embolism Maternal Grandmother   . Pulmonary embolism Maternal Grandfather   . Pulmonary embolism Paternal Grandmother   . Pulmonary embolism Paternal Grandfather     Social history:  reports that he quit smoking about 45 years ago. His smoking use included Cigarettes. He has a 1.5 pack-year smoking history. He has never used smokeless tobacco. He reports that he does not drink alcohol or use illicit drugs.   No Known Allergies  Medications:  Prior to Admission medications   Medication Sig Start Date End Date Taking? Authorizing Provider  bromocriptine (PARLODEL) 2.5 MG tablet 2.5 mg. 1/2 tablet twice weekly 09/07/14  Yes Historical Provider, MD  captopril (CAPOTEN) 25 MG tablet Take 12.5 mg by mouth 2 (two) times daily.    Yes Historical Provider, MD  latanoprost (XALATAN) 0.005 % ophthalmic solution place 1 drop into both eyes at bedtime PLS. SCHEDULE AN APPOINTMENT BEFORE FUTURE REFILLS 03/30/15  Yes Historical Provider, MD  metoprolol succinate (TOPROL-XL) 50 MG 24 hr tablet Take 25 mg by mouth daily. Take with or immediately following a meal.   Yes Historical Provider, MD  warfarin (COUMADIN) 5 MG tablet Take 5 mg by mouth daily.  03/06/12  Yes Jay Belt, MD    ROS:  Out of a complete 14 system review of symptoms, the patient  complains only of the following symptoms, and all other reviewed systems are negative.  Shortness of breath Memory loss  Blood pressure 110/76, pulse 58, height 6\' 1"  (1.854 m), weight 189 lb (85.73 kg).  Physical Exam  General: The patient is alert and cooperative at the time of the examination.  Skin: No significant peripheral edema is noted.   Neurologic Exam  Mental status: The patient is alert and oriented x 3 at the time of the examination. The patient has apparent normal recent and remote memory, with an apparently normal attention span and concentration ability.   Cranial nerves: Facial symmetry is present. Speech is normal, no aphasia or dysarthria is noted. Extraocular movements are full. Visual fields are full.  Motor: The patient has good strength in all 4 extremities.  Sensory examination: Soft touch sensation is symmetric on the face, arms, and legs.  Coordination: The patient has good finger-nose-finger and heel-to-shin bilaterally.  Gait and station: The patient has a normal gait. Tandem gait is normal. Romberg is negative. No drift is seen.  Reflexes: Deep tendon reflexes are symmetric.   MRI brain results 03/16/15:  IMPRESSION: This is an abnormal MRI of the brain with and without contrast showed the following: 1. The pituitary microadenoma noted in 02/26/2005 that extended into the right cavernous sinus is not noted on the current study. Dedicated pituitary MRI with thin sections would better assess this if clinically indicated. 2. 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  3. Chronic sinusitis that has progressed mildly since 2006 is noted in the maxillary, ethmoid and frontal sinuses. 4. There are no acute findings.  * MRI scan images were reviewed online. I agree with the written report.    Assessment/Plan:  1. Headache,  resolved  2. Prolactin secreting macroadenoma of pituitary gland   The patient is doing well at this time. We will have him return to our office on an as-needed basis. It appears that the medication dosing alterations have been the etiology of his headaches.  Jill Alexanders MD 07/14/2015 8:33 AM  [facility] [address] [place], [place] [address]  Phone [phone] Fax [phone]

## 2015-09-13 ENCOUNTER — Encounter (HOSPITAL_COMMUNITY): Payer: Self-pay | Admitting: Emergency Medicine

## 2015-09-13 ENCOUNTER — Inpatient Hospital Stay (HOSPITAL_COMMUNITY)
Admission: EM | Admit: 2015-09-13 | Discharge: 2015-09-15 | DRG: 066 | Disposition: A | Discharge status: Home / Self Care | Payer: Medicare Other | Attending: Internal Medicine | Admitting: Internal Medicine

## 2015-09-13 ENCOUNTER — Emergency Department (HOSPITAL_COMMUNITY): Payer: Medicare Other

## 2015-09-13 DIAGNOSIS — H409 Unspecified glaucoma: Secondary | ICD-10-CM | POA: Diagnosis present

## 2015-09-13 DIAGNOSIS — I2510 Atherosclerotic heart disease of native coronary artery without angina pectoris: Secondary | ICD-10-CM | POA: Diagnosis present

## 2015-09-13 DIAGNOSIS — Z86718 Personal history of other venous thrombosis and embolism: Secondary | ICD-10-CM

## 2015-09-13 DIAGNOSIS — I6789 Other cerebrovascular disease: Secondary | ICD-10-CM | POA: Diagnosis not present

## 2015-09-13 DIAGNOSIS — N400 Enlarged prostate without lower urinary tract symptoms: Secondary | ICD-10-CM | POA: Diagnosis present

## 2015-09-13 DIAGNOSIS — D352 Benign neoplasm of pituitary gland: Secondary | ICD-10-CM | POA: Diagnosis present

## 2015-09-13 DIAGNOSIS — Z9842 Cataract extraction status, left eye: Secondary | ICD-10-CM

## 2015-09-13 DIAGNOSIS — Z7901 Long term (current) use of anticoagulants: Secondary | ICD-10-CM | POA: Diagnosis not present

## 2015-09-13 DIAGNOSIS — R972 Elevated prostate specific antigen [PSA]: Secondary | ICD-10-CM

## 2015-09-13 DIAGNOSIS — R262 Difficulty in walking, not elsewhere classified: Secondary | ICD-10-CM | POA: Diagnosis present

## 2015-09-13 DIAGNOSIS — I10 Essential (primary) hypertension: Secondary | ICD-10-CM | POA: Diagnosis present

## 2015-09-13 DIAGNOSIS — E785 Hyperlipidemia, unspecified: Secondary | ICD-10-CM | POA: Diagnosis present

## 2015-09-13 DIAGNOSIS — R270 Ataxia, unspecified: Secondary | ICD-10-CM | POA: Diagnosis present

## 2015-09-13 DIAGNOSIS — I639 Cerebral infarction, unspecified: Principal | ICD-10-CM | POA: Diagnosis present

## 2015-09-13 DIAGNOSIS — Z86711 Personal history of pulmonary embolism: Secondary | ICD-10-CM | POA: Diagnosis not present

## 2015-09-13 DIAGNOSIS — R42 Dizziness and giddiness: Secondary | ICD-10-CM | POA: Diagnosis present

## 2015-09-13 DIAGNOSIS — I252 Old myocardial infarction: Secondary | ICD-10-CM | POA: Diagnosis not present

## 2015-09-13 DIAGNOSIS — Z79899 Other long term (current) drug therapy: Secondary | ICD-10-CM | POA: Diagnosis not present

## 2015-09-13 DIAGNOSIS — Z9841 Cataract extraction status, right eye: Secondary | ICD-10-CM

## 2015-09-13 DIAGNOSIS — Z87891 Personal history of nicotine dependence: Secondary | ICD-10-CM

## 2015-09-13 LAB — CBC
HEMATOCRIT: 40.7 % (ref 39.0–52.0)
Hemoglobin: 14.4 g/dL (ref 13.0–17.0)
MCH: 33.3 pg (ref 26.0–34.0)
MCHC: 35.4 g/dL (ref 30.0–36.0)
MCV: 94.2 fL (ref 78.0–100.0)
Platelets: 141 10*3/uL — ABNORMAL LOW (ref 150–400)
RBC: 4.32 MIL/uL (ref 4.22–5.81)
RDW: 12.1 % (ref 11.5–15.5)
WBC: 5.8 10*3/uL (ref 4.0–10.5)

## 2015-09-13 LAB — PROTIME-INR
INR: 1.13 (ref 0.00–1.49)
Prothrombin Time: 14.7 seconds (ref 11.6–15.2)

## 2015-09-13 LAB — URINE MICROSCOPIC-ADD ON
BACTERIA UA: NONE SEEN
SQUAMOUS EPITHELIAL / LPF: NONE SEEN

## 2015-09-13 LAB — BASIC METABOLIC PANEL
ANION GAP: 5 (ref 5–15)
BUN: 13 mg/dL (ref 6–20)
CHLORIDE: 109 mmol/L (ref 101–111)
CO2: 29 mmol/L (ref 22–32)
Calcium: 9.5 mg/dL (ref 8.9–10.3)
Creatinine, Ser: 0.83 mg/dL (ref 0.61–1.24)
Glucose, Bld: 94 mg/dL (ref 65–99)
POTASSIUM: 3.7 mmol/L (ref 3.5–5.1)
SODIUM: 143 mmol/L (ref 135–145)

## 2015-09-13 LAB — URINALYSIS, ROUTINE W REFLEX MICROSCOPIC
Bilirubin Urine: NEGATIVE
GLUCOSE, UA: NEGATIVE mg/dL
Ketones, ur: NEGATIVE mg/dL
LEUKOCYTES UA: NEGATIVE
NITRITE: NEGATIVE
PROTEIN: NEGATIVE mg/dL
Specific Gravity, Urine: 1.013 (ref 1.005–1.030)
pH: 6.5 (ref 5.0–8.0)

## 2015-09-13 LAB — I-STAT TROPONIN, ED: Troponin i, poc: 0 ng/mL (ref 0.00–0.08)

## 2015-09-13 MED ORDER — WARFARIN SODIUM 7.5 MG PO TABS
7.5000 mg | ORAL_TABLET | ORAL | Status: AC
  Administered 2015-09-14: 7.5 mg via ORAL
  Filled 2015-09-13 (×2): qty 1

## 2015-09-13 MED ORDER — WARFARIN - PHARMACIST DOSING INPATIENT: Freq: Every day | Status: DC

## 2015-09-13 NOTE — ED Notes (Addendum)
Patient reports he had prostate biopsy last Wednesday and went off of warfarin for 1 week.  Reports previous embolism and states he is concerned about this at present.  Patient reports dizziness which began yesterday.  Denies any falls, nausea, vomiting.  Reports left sided chest pain which occurred yesterday.  States this has since resolved.  Denies shortness of breath.

## 2015-09-13 NOTE — H&P (Signed)
[facility] Admission History and Physical       Jay Day [id number] DOB: 1941-01-03 DOA: 09/13/2015  Referring physician: EDP PCP: [name], MD  Specialists:   Chief Complaint: Dizziness and Problems with his balance  HPI: Jay Day is a 75 y.o. 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  He denies any headache, or fevers or chills or Chest pain or palptations.  He had a prostate biopsy performed 4 days ago, and had to stop his Coumadin Rx for 5 days prior to the procedure, and he restarted his Coumadin 2 days ago.   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      Review of Systems:  Constitutional: No Weight Loss, No Weight Gain, Night Sweats, Fevers, Chills, +Dizziness, +Light Headedness, Fatigue, or Generalized Weakness HEENT: No Headaches, Difficulty Swallowing,Tooth/Dental Problems,Sore Throat,  No Sneezing, Rhinitis, Ear Ache, Nasal Congestion, or Post Nasal Drip,  Cardio-vascular:  No Chest pain, Orthopnea, PND, Edema in Lower Extremities, Anasarca, Dizziness, Palpitations  Resp: No Dyspnea, No DOE, No Productive Cough, No Non-Productive Cough, No Hemoptysis, No Wheezing.    GI: No Heartburn, Indigestion, Abdominal Pain, Nausea, Vomiting, Diarrhea, Constipation, Hematemesis, Hematochezia, Melena, Change in Bowel Habits,  Loss of Appetite  GU: No Dysuria, No Change in Color of Urine, No Urgency or Urinary Frequency, No Flank pain.  Musculoskeletal: No Joint Pain or Swelling, No Decreased Range of Motion, No Back Pain.  Neurologic: No Syncope, No Seizures, Muscle Weakness, Paresthesia, Vision Disturbance or Loss, No Diplopia, +Vertigo, +Difficulty Walking,  Skin: No Rash or  Lesions. Psych: No Change in Mood or Affect, No Depression or Anxiety, No Memory loss, No Confusion, or Hallucinations   Past Medical History  Diagnosis Date  . Pulmonary embolism ([place])   . Clotting disorder ([employer])   . Asthma     hx of as child  . Bronchitis     as a child  . Pituitary tumor [facility])     takes medication to manage  . Hypertension     sees [name], primary   . Elevated PSA     being monitored by physician  . Cataract   . 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 Glaucoma 10/13/2011  . 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 Chronic anticoagulation 06/11/2013  . MI (myocardial infarction) ([place])   . Peyronie's disease   . Prostatitis   . Headache disorder 02/24/2015     Past Surgical History  Procedure Laterality Date  . Knee surgery  1962    left   . Hernia repair  1960  . Tonsillectomy      as a child  . Eye surgery      bilateral cataract surgery  . Inguinal hernia repair  08/12/2011    Procedure: LAPAROSCOPIC INGUINAL HERNIA;  Surgeon: [name], MD;  Location: [place];  Service: General;  Laterality: Left;  . Left heart catheterization with coronary angiogram N/A 05/06/2014    Procedure: LEFT HEART  CATHETERIZATION WITH CORONARY ANGIOGRAM;  Surgeon: [name], MD;  Location: [facility] C[facility] LAB;  Service: Cardiovascular;  Laterality: N/A;      Prior to Admission medications   Medication Sig Start Date End Date Taking? Authorizing Provider  bromocriptine (PARLODEL) 2.5 MG tablet Take 1.25 mg by mouth 3 (three) times a week. Monday, Wednesday,and Fridays 09/07/14  Yes Historical Provider, MD  captopril (CAPOTEN) 25 MG tablet Take 25 mg by mouth daily.    Yes Historical Provider, MD  Copper Gluconate (COPPER CAPS PO) Take 2 capsules by mouth daily.   Yes  Historical Provider, MD  Cyanocobalamin (VITAMIN B-12 PO) Take 1 tablet by mouth daily.   Yes Historical Provider, MD  latanoprost (XALATAN) 0.005 % ophthalmic solution place 1 drop into both eyes at bedtime PLS. SCHEDULE AN APPOINTMENT BEFORE FUTURE REFILLS 03/30/15  Yes Historical Provider, MD  Melatonin 3 MG TABS Take 3 mg by mouth at bedtime.   Yes Historical Provider, MD  metoprolol succinate (TOPROL-XL) 50 MG 24 hr tablet Take 25 mg by mouth daily. Take with or immediately following a meal.   Yes Historical Provider, MD  Probiotic CAPS Take 1 capsule by mouth daily.   Yes Historical Provider, MD  warfarin (COUMADIN) 5 MG tablet Take 5 mg by mouth daily.  03/06/12  Yes Annia Belt, MD     Allergies  Allergen Reactions  . Gluten Meal     Gluten Sensitivity     Social History:  reports that he quit smoking about 45 years ago. His smoking use included Cigarettes. He has a 1.5 pack-year smoking history. He has never used smokeless tobacco. He reports that he does not drink alcohol or use illicit drugs.    Family History  Problem Relation Age of Onset  . Pulmonary embolism Mother   . Kidney failure Mother   . Hypertension Mother   . Kidney Stones Mother   . Pulmonary embolism Father   . Prostate cancer Father   . Pulmonary embolism Maternal Grandmother   . Pulmonary embolism Maternal Grandfather   . Pulmonary embolism Paternal Grandmother   . Pulmonary embolism Paternal Grandfather        Physical Exam:  GEN:  Pleasant Well Nourished and Well Developed  75 y.o. Caucasian male examined and in no acute distress; cooperative with exam Filed Vitals:   09/13/15 1154 09/13/15 1413 09/13/15 1627 09/13/15 2045  BP:  141/88 154/95 157/89  Pulse:  50 47 52  Temp:  97.7 F (36.5 C)    TempSrc:  Oral    Resp:  15 20 20   Height: 6\' 1"  (1.854 m)     Weight: 83.915 kg (185 lb)     SpO2:  99% 100% 97%   Blood pressure 157/89, pulse 52, temperature 97.7 F (36.5 C), temperature  source Oral, resp. rate 20, height 6\' 1"  (1.854 m), weight 83.915 kg (185 lb), SpO2 97 %. PSYCH: He is alert and oriented x4; does not appear anxious does not appear depressed; affect is normal HEENT: Normocephalic and Atraumatic, Mucous membranes pink; PERRLA; EOM intact; Fundi:  Benign;  No scleral icterus, Nares: Patent, Oropharynx: Clear, Fair Dentition,    Neck:  FROM, No Cervical Lymphadenopathy nor Thyromegaly or Carotid Bruit; No JVD; Breasts:: Not examined CHEST WALL: No tenderness CHEST: Normal respiration, clear to auscultation bilaterally HEART: Regular rate and rhythm; no murmurs rubs or gallops BACK: No kyphosis or scoliosis; No CVA tenderness ABDOMEN: Positive Bowel Sounds, Scaphoid, Soft Non-Tender, No Rebound or Guarding; No  Masses, No Organomegaly,  Rectal Exam: Not done EXTREMITIES: No Cyanosis, Clubbing, or Edema; No Ulcerations. Genitalia: not examined PULSES: 2+ and symmetric SKIN: Normal hydration no rash or ulceration  CNS:  Alert and Oriented x 4, No Focal Deficits Mental Status:  Alert, Oriented, Thought Content Appropriate. Speech Fluent without evidence of Aphasia. Able to follow 3 step commands without difficulty.  In No obvious pain.   Cranial Nerves:  II: Discs flat bilaterally; Visual fields Intact, Pupils equal and reactive.    III,IV, VI: Extra-ocular motions intact bilaterally    V,VII: smile symmetric, facial light touch sensation normal bilaterally    VIII: hearing intact bilaterally    IX,X: gag reflex present    XI: bilateral shoulder shrug    XII: midline tongue extension   Motor:  Right:  Upper extremity 5/5     Left:  Upper extremity 5/5     Right:  Lower extremity 5/5    Left:  Lower extremity 5/5     Tone and Bulk:  normal tone throughout; no atrophy noted   Sensory:  Pinprick and light touch intact throughout, bilaterally   Deep Tendon Reflexes: 2+ and symmetric throughout   Plantars/ Babinski:  Right: equivocal   Left:  normal     Cerebellar:  Finger to nose without difficulty.   Gait: deferred    Vascular: pulses palpable throughout    Labs on Admission:  Basic Metabolic Panel:  Recent Labs Lab 09/13/15 1153  NA 143  K 3.7  CL 109  CO2 29  GLUCOSE 94  BUN 13  CREATININE 0.83  CALCIUM 9.5   Liver Function Tests: No results for input(s): AST, ALT, ALKPHOS, BILITOT, PROT, ALBUMIN in the last 168 hours. No results for input(s): LIPASE, AMYLASE in the last 168 hours. No results for input(s): AMMONIA in the last 168 hours. CBC:  Recent Labs Lab 09/13/15 1153  WBC 5.8  HGB 14.4  HCT 40.7  MCV 94.2  PLT 141*   Cardiac Enzymes: No results for input(s): CKTOTAL, CKMB, CKMBINDEX, TROPONINI in the last 168 hours.  BNP (last 3 results) No results for input(s): BNP in the last 8760 hours.  ProBNP (last 3 results) No results for input(s): PROBNP in the last 8760 hours.  CBG: No results for input(s): GLUCAP in the last 168 hours.  Radiological Exams on Admission: Dg Chest 2 View  09/13/2015  CLINICAL DATA:  Dizziness starting yesterday, chest pain. History of MI, pulmonary embolism, hypertension and pituitary adenoma. Former smoker. EXAM: CHEST  2 VIEW COMPARISON:  Chest x-ray dated 09/11/2011. FINDINGS: Lungs are at least mildly hyperexpanded indicating COPD. Suspect associated chronic bronchitic changes centrally. Probable mild scarring at each lung base. Lungs otherwise clear. No evidence of pneumonia. No pleural effusion or pneumothorax seen. Heart size is normal. Overall cardiomediastinal silhouette is stable in size and configuration. Osseous and soft tissue structures about the chest are unremarkable. IMPRESSION: 1. Lungs at least mildly hyperexpanded suggesting COPD/emphysema. Suspect associated chronic bronchitic changes centrally. 2. Overall, no acute findings. Probable mild scarring/fibrosis at each lung base. No evidence of pneumonia. Electronically Signed   By: Franki Cabot M.D.   On:  09/13/2015 12:53   Mr Brain Wo Contrast  09/13/2015  CLINICAL DATA:  Dizziness which began yesterday. Recent prostate biopsy. Gait with balance issues. EXAM: MRI HEAD WITHOUT CONTRAST TECHNIQUE: Multiplanar, multiecho pulse sequences of the brain and surrounding structures were obtained without intravenous contrast. COMPARISON:  MR brain 03/13/2015. FINDINGS: There is an acute deep white  matter infarct affecting the LEFT posterior frontal subcortical white matter, 5 x 7 mm in size, nonhemorrhagic. No other areas of acute infarction are seen, with specific attention to the posterior circulation. No mass lesion, hydrocephalus, or extra-axial fluid. Normal for age cerebral volume. Mild T2 and FLAIR hyperintensities throughout the periventricular and subcortical white matter, likely chronic microvascular ischemic change. Remote lacunar infarct, LEFT basal ganglia. No midline abnormality. Flow voids are maintained. RIGHT vertebral dominant. Chronic sinus disease. BILATERAL cataract extraction. Compared with the prior MR, this is a new finding. IMPRESSION: Acute deep white matter infarct affecting the LEFT posterior frontal subcortical white matter, 5 x 7 mm in size, nonhemorrhagic. Atrophy and small vessel disease. Electronically Signed   By: Staci Righter M.D.   On: 09/13/2015 19:11           Assessment/Plan:      75 y.o. male with  Principal Problem:    CVA (cerebral infarction)   CVA workup   Cardiac Monitoring   Neurology consulted   Carotid US, 2D ECHO in AM   Check fasting Lipids, and HbA1C in AM   ASA Rx   Active Problems:    Chronic anticoagulation   On Coumadin Rx, just restarted 2 days ago      History of pulmonary embolism   On Coumadin, just restarted 2 days ago      Pituitary adenoma (H[facility])   On Bromocriptine Rx      Elevated PSA   Recent Prostate Bx    DVT Prophylaxis   On Coumadin Rx     Code Status:     FULL CODE Family Communication:   Wife at Bedside        Disposition Plan:    Inpatient Status        Time spent: [facility] Pager [phone]   If Early Please Contact the Day Rounding Team MD for [facility]  If 7PM-7AM, Please Contact Night-Floor Coverage  [email or web address] Password [email or web address] 09/13/2015, 9:12 PM     ADDENDUM:   Patient was seen and examined on 09/13/2015

## 2015-09-13 NOTE — ED Provider Notes (Signed)
CSN: [id number]     Arrival date & time 09/13/15  1142 History   First MD Initiated Contact with Patient 09/13/15 1504     Chief Complaint  Patient presents with  . Dizziness     (Consider location/radiation/quality/duration/timing/severity/associated sxs/prior Treatment) HPI Patient presents to the emergency department with discoordination and gait disturbance started yesterday.  The patient states that seemed to maybe get somewhat better but continued into today.  Patient states that he did not have any numbness or weakness in his extremities.  Patient states that he feels like he is mentating appropriately and his wife agrees with this assessment.  The patient sates that he felt lightheaded as well. The patient denies chest pain, shortness of breath, headache,blurred vision, neck pain, fever, cough, weakness, numbness, dizziness, anorexia, edema, abdominal pain, nausea, vomiting, diarrhea, rash, back pain, dysuria, hematemesis, bloody stool, near syncope, or syncope.  Patient states nothing seems make the condition better or worse Past Medical History  Diagnosis Date  . Pulmonary embolism ([place])   . Clotting disorder ([place])   . Asthma     hx of as child  . Bronchitis     as a child  . Pituitary tumor [facility])     takes medication to manage  . Hypertension     sees [name], primary   . Elevated PSA     being monitored by physician  . Cataract   . 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 Glaucoma 10/13/2011  . 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 Chronic anticoagulation 06/11/2013  . MI (myocardial infarction) ([place])   . Peyronie's disease   . Prostatitis   . Headache disorder 02/24/2015   Past Surgical History  Procedure Laterality Date  . Knee surgery  1962    left   .  Hernia repair  1960  . Tonsillectomy      as a child  . Eye surgery      bilateral cataract surgery  . Inguinal hernia repair  08/12/2011    Procedure: LAPAROSCOPIC INGUINAL HERNIA;  Surgeon: [name], MD;  Location: [place];  Service: General;  Laterality: Left;  . Left heart catheterization with coronary angiogram N/A 05/06/2014    Procedure: LEFT HEART CATHETERIZATION WITH CORONARY ANGIOGRAM;  Surgeon: [name], MD;  Location: [facility] CATH LAB;  Service: Cardiovascular;  Laterality: N/A;   Family History  Problem Relation Age of Onset  . Pulmonary embolism Mother   . Kidney failure Mother   . Hypertension Mother   . Kidney Stones Mother   . Pulmonary embolism Father   . Prostate cancer Father   . Pulmonary embolism Maternal Grandmother   . Pulmonary embolism Maternal Grandfather   . Pulmonary embolism Paternal Grandmother   . Pulmonary embolism Paternal Grandfather    Social History  Substance Use Topics  . Smoking status: Former Smoker -- 0.30 packs/day for 5 years    Types: Cigarettes    Quit date: 06/13/1970  . Smokeless tobacco: Never Used  . Alcohol Use: No     Comment: very little    Review of Systems  All other systems negative except as documented in the HPI. All pertinent positives and negatives as reviewed in the HPI.  Allergies  Gluten meal  Home Medications   Prior  to Admission medications   Medication Sig Start Date End Date Taking? Authorizing Provider  bromocriptine (PARLODEL) 2.5 MG tablet Take 1.25 mg by mouth 3 (three) times a week. Monday, Wednesday,and Fridays 09/07/14  Yes Historical Provider, MD  captopril (CAPOTEN) 25 MG tablet Take 25 mg by mouth daily.    Yes Historical Provider, MD  Copper Gluconate (COPPER CAPS PO) Take 2 capsules by mouth daily.   Yes Historical Provider, MD  Cyanocobalamin (VITAMIN B-12 PO) Take 1 tablet by mouth daily.   Yes Historical Provider, MD  latanoprost (XALATAN) 0.005 % ophthalmic solution place 1 drop into  both eyes at bedtime PLS. SCHEDULE AN APPOINTMENT BEFORE FUTURE REFILLS 03/30/15  Yes Historical Provider, MD  Melatonin 3 MG TABS Take 3 mg by mouth at bedtime.   Yes Historical Provider, MD  metoprolol succinate (TOPROL-XL) 50 MG 24 hr tablet Take 25 mg by mouth daily. Take with or immediately following a meal.   Yes Historical Provider, MD  Probiotic CAPS Take 1 capsule by mouth daily.   Yes Historical Provider, MD  warfarin (COUMADIN) 5 MG tablet Take 5 mg by mouth daily.  03/06/12  Yes Annia Belt, MD   BP 157/89 mmHg  Pulse 52  Temp(Src) 97.7 F (36.5 C) (Oral)  Resp 20  Ht 6\' 1"  (1.854 m)  Wt 83.915 kg  BMI 24.41 kg/m2  SpO2 97% Physical Exam  Constitutional: He is oriented to person, place, and time. He appears well-developed and well-nourished. No distress.  HENT:  Head: Normocephalic and atraumatic.  Mouth/Throat: Oropharynx is clear and moist.  Eyes: Pupils are equal, round, and reactive to light.  Neck: Normal range of motion. Neck supple.  Cardiovascular: Normal rate, regular rhythm and normal heart sounds.  Exam reveals no gallop and no friction rub.   No murmur heard. Pulmonary/Chest: Effort normal and breath sounds normal. No respiratory distress. He has no wheezes.  Abdominal: Soft. Bowel sounds are normal. He exhibits no distension. There is no tenderness.  Neurological: He is alert and oriented to person, place, and time. He exhibits normal muscle tone. Gait abnormal. Coordination normal.  Skin: Skin is warm and dry. No rash noted. No erythema.  Psychiatric: He has a normal mood and affect. His behavior is normal.  Nursing note and vitals reviewed.   ED Course  Procedures (including critical care time) Labs Review Labs Reviewed  CBC - Abnormal; Notable for the following:    Platelets 141 (*)    All other components within normal limits  URINALYSIS, ROUTINE W REFLEX MICROSCOPIC (NOT AT [facility]) - Abnormal; Notable for the following:    Hgb urine dipstick  TRACE (*)    All other components within normal limits  BASIC METABOLIC PANEL  URINE MICROSCOPIC-ADD ON  PROTIME-INR  Randolm Idol, ED    Imaging Review Dg Chest 2 View  09/13/2015  CLINICAL DATA:  Dizziness starting yesterday, chest pain. History of MI, pulmonary embolism, hypertension and pituitary adenoma. Former smoker. EXAM: CHEST  2 VIEW COMPARISON:  Chest x-ray dated 09/11/2011. FINDINGS: Lungs are at least mildly hyperexpanded indicating COPD. Suspect associated chronic bronchitic changes centrally. Probable mild scarring at each lung base. Lungs otherwise clear. No evidence of pneumonia. No pleural effusion or pneumothorax seen. Heart size is normal. Overall cardiomediastinal silhouette is stable in size and configuration. Osseous and soft tissue structures about the chest are unremarkable. IMPRESSION: 1. Lungs at least mildly hyperexpanded suggesting COPD/emphysema. Suspect associated chronic bronchitic changes centrally. 2. Overall, no acute findings. Probable mild  scarring/fibrosis at each lung base. No evidence of pneumonia. Electronically Signed   By: Franki Cabot M.D.   On: 09/13/2015 12:53   Mr Brain Wo Contrast  09/13/2015  CLINICAL DATA:  Dizziness which began yesterday. Recent prostate biopsy. Gait with balance issues. EXAM: MRI HEAD WITHOUT CONTRAST TECHNIQUE: Multiplanar, multiecho pulse sequences of the brain and surrounding structures were obtained without intravenous contrast. COMPARISON:  MR brain 03/13/2015. FINDINGS: There is an acute deep white matter infarct affecting the LEFT posterior frontal subcortical white matter, 5 x 7 mm in size, nonhemorrhagic. No other areas of acute infarction are seen, with specific attention to the posterior circulation. No mass lesion, hydrocephalus, or extra-axial fluid. Normal for age cerebral volume. Mild T2 and FLAIR hyperintensities throughout the periventricular and subcortical white matter, likely chronic microvascular ischemic  change. Remote lacunar infarct, LEFT basal ganglia. No midline abnormality. Flow voids are maintained. RIGHT vertebral dominant. Chronic sinus disease. BILATERAL cataract extraction. Compared with the prior MR, this is a new finding. IMPRESSION: Acute deep white matter infarct affecting the LEFT posterior frontal subcortical white matter, 5 x 7 mm in size, nonhemorrhagic. Atrophy and small vessel disease. Electronically Signed   By: Staci Righter M.D.   On: 09/13/2015 19:11   I have personally reviewed and evaluated these images and lab results as part of my medical decision-making.   EKG Interpretation None      MDM   Final diagnoses:  Cerebral infarction due to unspecified mechanism     I spoke with neurology and the [facility] who will admit him to the hospital for further evaluation.  There was a significant delay in his MRI being obtained.  I did send an email to the MRI supervisor about this delay the patient is advised plan and all questions were answered.  My concern was for posterior circulation stroke that is what obtained the MRI  Dalia Heading, PA-C 09/14/15 [place], MD 09/14/15 2147

## 2015-09-13 NOTE — ED Notes (Signed)
Pt states he is experiencing polyuria and dysuria.

## 2015-09-13 NOTE — Progress Notes (Signed)
ANTICOAGULATION CONSULT NOTE - Initial Consult  Pharmacy Consult for warfarin Indication: pulmonary embolus, DVT and stroke  Allergies  Allergen Reactions  . Gluten Meal     Gluten Sensitivity     Patient Measurements: Height: 6\' 1"  (185.4 cm) Weight: 185 lb (83.915 kg) IBW/kg (Calculated) : 79.9 Heparin Dosing Weight:   Vital Signs: Temp: 97.7 F (36.5 C) (04/02 1413) Temp Source: Oral (04/02 1413) BP: 157/89 mmHg (04/02 2045) Pulse Rate: 52 (04/02 2045)  Labs:  Recent Labs  09/13/15 1153 09/13/15 2234  HGB 14.4  --   HCT 40.7  --   PLT 141*  --   LABPROT  --  14.7  INR  --  1.13  CREATININE 0.83  --     Estimated Creatinine Clearance: 89.6 mL/min (by C-G formula based on Cr of 0.83).   Medical History: Past Medical History  Diagnosis Date  . Pulmonary embolism ([place])   . Clotting disorder ([place])   . Asthma     hx of as child  . Bronchitis     as a child  . Pituitary tumor [facility])     takes medication to manage  . Hypertension     sees [name], primary   . Elevated PSA     being monitored by physician  . Cataract   . 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 Glaucoma 10/13/2011  . 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 Chronic anticoagulation 06/11/2013  . MI (myocardial infarction) ([place])   . Peyronie's disease   . Prostatitis   . Headache disorder 02/24/2015    Medications:   (Not in a hospital admission) Scheduled:  . [START ON 09/14/2015] warfarin  7.5 mg Oral NOW  . [START ON 09/14/2015] Warfarin - Pharmacist Dosing Inpatient   Does not apply q1800    Assessment: Patient on chronic warfarin therpay for hx of DVT/PE.  Patient off of warfarin within past week for prostate biopsy.  Patient now in ED with new CVA per CT.  INR on admit 1.13  Last dose of warfarin per med rec is 09/12/15  Goal of Therapy:  INR 2-3    Plan:  Start with Coumadin 7.5 mg tonight. Check PT/INR daily.    Nani Skillern [place]ford 09/13/2015,11:49 PM

## 2015-09-13 NOTE — ED Notes (Addendum)
Pt c/o dizziness which started yesterday, chest pain, denies n/v. Pt had prostate biopsy Wednesday. Denies chest pain at this time, states he feels a "little fuzzy".

## 2015-09-14 ENCOUNTER — Encounter (HOSPITAL_COMMUNITY): Payer: Self-pay | Admitting: *Deleted

## 2015-09-14 ENCOUNTER — Inpatient Hospital Stay (HOSPITAL_COMMUNITY): Payer: Medicare Other

## 2015-09-14 DIAGNOSIS — I6789 Other cerebrovascular disease: Secondary | ICD-10-CM

## 2015-09-14 DIAGNOSIS — I639 Cerebral infarction, unspecified: Principal | ICD-10-CM | POA: Insufficient documentation

## 2015-09-14 DIAGNOSIS — R972 Elevated prostate specific antigen [PSA]: Secondary | ICD-10-CM | POA: Diagnosis present

## 2015-09-14 LAB — LIPID PANEL
CHOL/HDL RATIO: 4 ratio
Cholesterol: 159 mg/dL (ref 0–200)
HDL: 40 mg/dL — ABNORMAL LOW (ref >40)
LDL Cholesterol: 80 mg/dL (ref 0–99)
Triglycerides: 196 mg/dL — ABNORMAL HIGH (ref <150)
VLDL: 39 mg/dL (ref 0–40)

## 2015-09-14 LAB — ECHOCARDIOGRAM COMPLETE
HEIGHTINCHES: 73 in
WEIGHTICAEL: 2960 [oz_av]

## 2015-09-14 LAB — PROTIME-INR
INR: 1.26 (ref 0.00–1.49)
Prothrombin Time: 16 seconds — ABNORMAL HIGH (ref 11.6–15.2)

## 2015-09-14 MED ORDER — RISAQUAD PO CAPS
1.0000 | ORAL_CAPSULE | Freq: Every day | ORAL | Status: DC
  Administered 2015-09-14 – 2015-09-15 (×2): 1 via ORAL
  Filled 2015-09-14 (×2): qty 1

## 2015-09-14 MED ORDER — HYDROCODONE-ACETAMINOPHEN 5-325 MG PO TABS
2.0000 | ORAL_TABLET | Freq: Once | ORAL | Status: DC
Start: 2015-09-14 — End: 2015-09-15

## 2015-09-14 MED ORDER — BROMOCRIPTINE MESYLATE 2.5 MG PO TABS
1.2500 mg | ORAL_TABLET | ORAL | Status: DC
  Administered 2015-09-14: 1.25 mg via ORAL
  Filled 2015-09-14: qty 1

## 2015-09-14 MED ORDER — LATANOPROST 0.005 % OP SOLN
1.0000 [drp] | Freq: Every day | OPHTHALMIC | Status: DC
  Administered 2015-09-14 (×2): 1 [drp] via OPHTHALMIC
  Filled 2015-09-14: qty 2.5

## 2015-09-14 MED ORDER — ASPIRIN 325 MG PO TABS
325.0000 mg | ORAL_TABLET | Freq: Every day | ORAL | Status: DC
  Administered 2015-09-14 – 2015-09-15 (×2): 325 mg via ORAL
  Filled 2015-09-14 (×2): qty 1

## 2015-09-14 MED ORDER — SENNOSIDES-DOCUSATE SODIUM 8.6-50 MG PO TABS
1.0000 | ORAL_TABLET | Freq: Every evening | ORAL | Status: DC | PRN
  Administered 2015-09-14: 1 via ORAL

## 2015-09-14 MED ORDER — METOPROLOL SUCCINATE ER 25 MG PO TB24
25.0000 mg | ORAL_TABLET | Freq: Every day | ORAL | Status: DC
Start: 2015-09-14 — End: 2015-09-15
  Administered 2015-09-14 – 2015-09-15 (×2): 25 mg via ORAL
  Filled 2015-09-14 (×2): qty 1

## 2015-09-14 MED ORDER — WARFARIN SODIUM 5 MG PO TABS: 5.0000 mg | ORAL_TABLET | Freq: Every day | ORAL | Status: DC

## 2015-09-14 MED ORDER — CAPTOPRIL 25 MG PO TABS
25.0000 mg | ORAL_TABLET | Freq: Every day | ORAL | Status: DC
  Administered 2015-09-14 – 2015-09-15 (×2): 25 mg via ORAL
  Filled 2015-09-14 (×3): qty 1

## 2015-09-14 MED ORDER — STROKE: EARLY STAGES OF RECOVERY BOOK
Freq: Once | Status: AC
  Administered 2015-09-14: 02:00:00

## 2015-09-14 MED ORDER — IOPAMIDOL (ISOVUE-370) INJECTION 76%
INTRAVENOUS | Status: AC
  Administered 2015-09-14: 50 mL
  Filled 2015-09-14: qty 100

## 2015-09-14 MED ORDER — ATORVASTATIN CALCIUM 10 MG PO TABS
10.0000 mg | ORAL_TABLET | Freq: Every day | ORAL | Status: DC
  Filled 2015-09-14: qty 1

## 2015-09-14 MED ORDER — ASPIRIN 300 MG RE SUPP: 300.0000 mg | Freq: Every day | RECTAL | Status: DC

## 2015-09-14 NOTE — Progress Notes (Signed)
  Echocardiogram 2D Echocardiogram has been performed.  Diamond Nickel 09/14/2015, 9:27 AM

## 2015-09-14 NOTE — Progress Notes (Signed)
STROKE TEAM PROGRESS NOTE   HISTORY OF PRESENT ILLNESS Jay Day is a 75 y.o. male he was in his normal state of health until around 12 AM on 09/12/2015 (LKW). He states that time he started noticing some mild difficulty with his walking. He states that he just felt unsteady when he was walking. Of note, he was on Coumadin for history of pulmonary embolism that have been 4 years ago. He was off his anticoagulants for several days because of a prostate biopsy. He had restarted Coumadin just a Day or 2 before his symptoms started. Premorbid modified rankin scale: 0. Patient was not administered IV t-PA secondary to being outside the window. He was admitted for further evaluation and treatment.   SUBJECTIVE (INTERVAL HISTORY) No family is at the bedside.  Overall he feels his condition is stable. He is a very knowledgeable guy about his health and diet.He has no prior history of strokes or TIA. Denies significant weakness, numbness or slurred speech. He did have balance difficulties which appear to be improving   OBJECTIVE Temp:  [97.6 F (36.4 C)-98.1 F (36.7 C)] 98.1 F (36.7 C) (04/03 0700) Pulse Rate:  [47-68] 66 (04/03 1000) Cardiac Rhythm:  [-] Sinus bradycardia (04/03 0706) Resp:  [15-20] 20 (04/03 0700) BP: (101-157)/(71-95) 110/71 mmHg (04/03 0700) SpO2:  [95 %-100 %] 95 % (04/03 0700) Weight:  [83.915 kg (185 lb)] 83.915 kg (185 lb) (04/03 0105)  CBC:   Recent Labs Lab 09/13/15 1153  WBC 5.8  HGB 14.4  HCT 40.7  MCV 94.2  PLT 141*    Basic Metabolic Panel:   Recent Labs Lab 09/13/15 1153  NA 143  K 3.7  CL 109  CO2 29  GLUCOSE 94  BUN 13  CREATININE 0.83  CALCIUM 9.5    Lipid Panel:     Component Value Date/Time   CHOL 159 09/14/2015 0618   TRIG 196* 09/14/2015 0618   HDL 40* 09/14/2015 0618   CHOLHDL 4.0 09/14/2015 0618   VLDL 39 09/14/2015 0618   LDLCALC 80 09/14/2015 0618   HgbA1c: No results found for: HGBA1C Urine Drug Screen: No results  found for: LABOPIA, COCAINSCRNUR, LABBENZ, AMPHETMU, THCU, LABBARB    IMAGING  Dg Chest 2 View 09/13/2015  1. Lungs at least mildly hyperexpanded suggesting COPD/emphysema. Suspect associated chronic bronchitic changes centrally. 2. Overall, no acute findings. Probable mild scarring/fibrosis at each lung base. No evidence of pneumonia.   Ct Angio Head & Neck W/cm &/or Wo/cm 09/14/2015   No significant carotid or vertebral artery stenosis in the neck. No significant intracranial stenosis or aneurysm.   Mr Brain Wo Contrast 09/13/2015   Acute deep white matter infarct affecting the LEFT posterior frontal subcortical white matter, 5 x 7 mm in size, nonhemorrhagic. Atrophy and small vessel disease.   Carotid Doppler   There is 1-39% bilateral ICA stenosis. Vertebral artery flow is antegrade.    2D Echocardiogram  LVEF 55-60%, moderate LVH, normal wall motion, diastolic dysfunction, normal LV filling pressure, normal LA size, mild RAE, normal IVC.   PHYSICAL EXAM Pleasant elderly Caucasian male currently not in distress. . Afebrile. Head is nontraumatic. Neck is supple without bruit.    Cardiac exam no murmur or gallop. Lungs are clear to auscultation. Distal pulses are well felt. Neurological Exam ;  Awake  Alert oriented x 3. Normal speech and language.eye movements full without nystagmus.fundi were not visualized. Vision acuity and fields appear normal. Hearing is normal. Palatal movements are normal. Face  symmetric. Tongue midline. Normal strength, tone, reflexes and coordination. Normal sensation. Gait deferred. ASSESSMENT/PLAN Jay Day is a 75 y.o. 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 He did not receive IV t-PA due to delay in arrival.   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 Atrophy. Small vessel disease.  CTA head negative  CTA neck negative  Carotid Doppler  No significant stenosis   2D Echo  No source of embolus   LDL 80  HgbA1c pending  Coumadin for VTE prophylaxis Diet regular Room service appropriate?: Yes; Fluid consistency:: Thin  warfarin daily prior to admission, on admission INR subtherapeutic at 1.13, now on aspirin 325 mg daily and warfarin daily. We will bridge to Coumadin with aspirin; stop aspirin when INR greater than or equal to 2.0. No indication for 2 agents long term.  Patient counseled to be compliant with his antithrombotic medications  Ongoing aggressive stroke risk factor management  Therapy recommendations:  No therapy needs  Heart healthy diet and exercise prescribed  Disposition:  Return home with wife  NOTHING FURTHER TO ADD FROM THE STROKE STANDPOINT  Patient has a 10-15% risk of having another stroke over the next year, the highest risk is within 2 weeks of the most recent stroke/TIA (risk of having a stroke following a stroke or TIA is the same).  Ongoing risk factor control by Primary Care Physician  Stroke Service will sign off. Please call should any needs arise.  Follow-up Stroke Clinic at [facility] with Dr. Antony Contras in 2 months, order placed.   Hypertension  Stable  Hyperlipidemia  Home meds:  no statin  LDL  80, goal < 70  Add statin  Continue statin at discharge  Other Stroke Risk Factors  Advanced age  Former Cigarette smoker, quit smoking 45 years ago   Coronary artery disease - MI  Hx clotting disorder with PE 4 years ago, on Coumadin since that time, recently stopped for prostate biopsy, receiving 2 days ago AND Chronic DVT, left  Likely family history of clotting disorder given family history of PE  Other Active Problems  Pituitary tumor  Elevated PSA status post recent biopsy  [facility] Day # [place]  [place] for Pager information 09/14/2015 4:23 PM  I have personally examined this patient, reviewed notes, independently viewed imaging studies, participated in medical decision making and plan of care. I have made any additions or clarifications directly to the above note. Agree with note above. He presented with sudden onset of gait ataxia and difficulty walking due to small left brain subcortical infarct due to small vessel disease. He had recently been off Coumadin for prostate biopsy and INR was subtherapeutic. He remains at risk for neurological worsening, recurrent stroke, TIA and needs ongoing stroke evaluation. Resume anticoagulation with warfarin and bridge with aspirin until INR is therapeutic.  Antony Contras, MD Medical Director [facility] Stroke [facility] Pager: [phone] 09/14/2015 5:54 PM  To contact Stroke Continuity provider, please refer to [email or web address] After hours, contact General Neurology

## 2015-09-14 NOTE — Progress Notes (Signed)
[facility] hospital[facility] progress note. Chief complaint. Transfer note. History of present illness. This 75 year old male with history of PE on Coumadin presented to the [facility] long emergency room with complaints of lightheadedness, dizziness, and ataxia. A CVA workup was initiated and the patient was found to have an acute posterior left frontal area nonhemorrhagic CVA on MRI. Case was discussed with neurology who requested that the patient be transferred to [facility] for further evaluation and treatment. Patient has now arrived and I'm seeing him at bedside to ensure he remained clinically stable and that his orders transferred here appropriately. Physical exam. Vital signs. Temperature 97.9, pulse 54, respiration 20, blood pressure 136/93. O2 sats 97%. General appearance. Well-developed male who is alert and in no distress. Cardiac. Rate and rhythm regular. Lungs. Breath sounds clear and equal. Abdomen. Soft with positive bowel sounds. No pain noted. Neurologic. Cranial nerves II-12 grossly intact. No unilateral or focal defects noted. Impression/plan. Problem #1. Acute deep white matter nonhemorrhagic infarct left posterior frontal area. This per MRI obtained at [facility] long emergency room. Patient appears clinically stable post transfer. I have asked notified neurology patient's arrival so that they may see him in consult. We'll follow for neurology recommendations. The patient appears clinically stable post transfer. All orders appear to of transferred here appropriately.

## 2015-09-14 NOTE — Progress Notes (Signed)
PT Cancellation Note  Patient Details Name: HUBER JAROSH MRN: [id number] DOB: 1941/04/10   Cancelled Treatment:    Reason Eval Not Completed: PT screened, no needs identified, will sign off  Patient observed walking in hallway with OT. No PT needs identified and OT agrees.   Avalon Coppinger 09/14/2015, 11:22 AM  Pager [phone]

## 2015-09-14 NOTE — Progress Notes (Addendum)
T [facility] Progress Note                                            Patient Demographics:    Jay Day, is a 74 y.o. male, DOB - 24-Apr-1941, [id number]  Admit date - 09/13/2015   Admitting Physician Theressa Millard, MD  Outpatient Primary MD for the patient is [name], MD  LOS - 1  Chief Complaint  Patient presents with  . Dizziness        Subjective:  Feels better this am, hasnt been out of bed this am yet   Assessment  & Plan :   CVA (cerebral infarction -MRI with acute L post frontal CVA -warfarin was restarted after break for prostate Biopsy -Neuro consulting -back on warfarin, INR subtherapeutic -NEuro consulting -FU Carotid US, 2D ECHO, fasting Lipids, and HbA1C -PT/OT/SLP eval  History of pulmonary embolism -On Coumadin, just restarted 2 days ago -INR subtherapeutic  H/o Pituitary adenoma ([place]) -continue bromocriptine Rx  Elevated PSA -Recent Prostate Bx  DVT proph: on warfarin  Code Status: FULL CODE Family Communication: Wife at Bedside  Disposition Plan: Inpatient Status     Barriers For Discharge :   Consults  :  Neuro  DVT Prophylaxis  : on warfarin  Lab Results  Component Value Date   PLT 141* 09/13/2015    Anti-infectives    None        Objective:   Filed Vitals:   09/14/15 0105 09/14/15 0300 09/14/15 0500 09/14/15 0700  BP: 136/93 101/75 121/72 110/71  Pulse: 54 56 68 51  Temp: 97.9 F (36.6 C) 98 F (36.7 C) 97.6 F (36.4 C) 98.1 F (36.7 C)  TempSrc: Oral Oral Oral Oral  Resp: 20 20 20 20   Height: 6\' 1"  (1.854 m)     Weight: 83.915 kg (185 lb)     SpO2: 97% 95% 96% 95%    Wt Readings from Last 3 Encounters:  09/14/15 83.915 kg (185 lb)  09/13/15 83.915 kg (185 lb)  07/14/15 85.73 kg (189 lb)    No intake or output data in the 24 hours ending 09/14/15  0729   Physical Exam  Awake Alert, Oriented X 3, No new F.N deficits, Normal affect [place].AT,PERRAL Supple Neck,No JVD, No cervical lymphadenopathy appriciated.  Symmetrical Chest wall movement, Good air movement bilaterally, CTAB RRR,No Gallops,Rubs or new Murmurs, No Parasternal Heave +ve B.Sounds, Abd Soft, No tenderness, No organomegaly appriciated, No rebound - guarding or rigidity. No Cyanosis, Clubbing or edema, No new Rash or bruise   Neuro: motor 5/5, sensations intact, DTR 2pplus, plantars downgoing, Finger nose : normal    Data Review:    CBC  Recent Labs Lab 09/13/15 1153  WBC 5.8  HGB 14.4  HCT 40.7  PLT 141*  MCV 94.2  MCH 33.3  MCHC 35.4  RDW 12.1    Chemistries   Recent Labs Lab 09/13/15 1153  NA 143  K 3.7  CL 109  CO2 29  GLUCOSE 94  BUN 13  CREATININE 0.83  CALCIUM 9.5   ------------------------------------------------------------------------------------------------------------------ No results for input(s): CHOL, HDL, LDLCALC, TRIG, CHOLHDL, LDLDIRECT in the last 72 hours.  No results found for: HGBA1C ------------------------------------------------------------------------------------------------------------------ No results for input(s): TSH, T4TOTAL, T3FREE, THYROIDAB in the last 72 hours.  Invalid input(s): FREET3 ------------------------------------------------------------------------------------------------------------------ No results for input(s): VITAMINB12, FOLATE, FERRITIN, TIBC, IRON, RETICCTPCT in  the last 72 hours.  Coagulation profile  Recent Labs Lab 09/13/15 2234 09/14/15 0618  INR 1.13 1.26    No results for input(s): DDIMER in the last 72 hours.  Cardiac Enzymes No results for input(s): CKMB, TROPONINI, MYOGLOBIN in the last 168 hours.  Invalid input(s): CK ------------------------------------------------------------------------------------------------------------------ No results found for: BNP  Inpatient  Medications  Scheduled Meds: . acidophilus  1 capsule Oral Daily  . aspirin  300 mg Rectal Daily   Or  . aspirin  325 mg Oral Daily  . bromocriptine  1.25 mg Oral Once per day on Mon Wed Fri  . captopril  25 mg Oral Daily  . latanoprost  1 drop Both Eyes QHS  . metoprolol succinate  25 mg Oral Daily  . Warfarin - Pharmacist Dosing Inpatient   Does not apply q1800   Continuous Infusions:  PRN Meds:.senna-docusate  Micro Results No results found for this or any previous visit (from the past 240 hour(s)).  Radiology Reports Dg Chest 2 View  09/13/2015  CLINICAL DATA:  Dizziness starting yesterday, chest pain. History of MI, pulmonary embolism, hypertension and pituitary adenoma. Former smoker. EXAM: CHEST  2 VIEW COMPARISON:  Chest x-ray dated 09/11/2011. FINDINGS: Lungs are at least mildly hyperexpanded indicating COPD. Suspect associated chronic bronchitic changes centrally. Probable mild scarring at each lung base. Lungs otherwise clear. No evidence of pneumonia. No pleural effusion or pneumothorax seen. Heart size is normal. Overall cardiomediastinal silhouette is stable in size and configuration. Osseous and soft tissue structures about the chest are unremarkable. IMPRESSION: 1. Lungs at least mildly hyperexpanded suggesting COPD/emphysema. Suspect associated chronic bronchitic changes centrally. 2. Overall, no acute findings. Probable mild scarring/fibrosis at each lung base. No evidence of pneumonia. Electronically Signed   By: Franki Cabot M.D.   On: 09/13/2015 12:53   Mr Brain Wo Contrast  09/13/2015  CLINICAL DATA:  Dizziness which began yesterday. Recent prostate biopsy. Gait with balance issues. EXAM: MRI HEAD WITHOUT CONTRAST TECHNIQUE: Multiplanar, multiecho pulse sequences of the brain and surrounding structures were obtained without intravenous contrast. COMPARISON:  MR brain 03/13/2015. FINDINGS: There is an acute deep white matter infarct affecting the LEFT posterior frontal  subcortical white matter, 5 x 7 mm in size, nonhemorrhagic. No other areas of acute infarction are seen, with specific attention to the posterior circulation. No mass lesion, hydrocephalus, or extra-axial fluid. Normal for age cerebral volume. Mild T2 and FLAIR hyperintensities throughout the periventricular and subcortical white matter, likely chronic microvascular ischemic change. Remote lacunar infarct, LEFT basal ganglia. No midline abnormality. Flow voids are maintained. RIGHT vertebral dominant. Chronic sinus disease. BILATERAL cataract extraction. Compared with the prior MR, this is a new finding. IMPRESSION: Acute deep white matter infarct affecting the LEFT posterior frontal subcortical white matter, 5 x 7 mm in size, nonhemorrhagic. Atrophy and small vessel disease. Electronically Signed   By: Staci Righter M.D.   On: 09/13/2015 19:11    Time Spent in minutes   39min   Laneice Meneely M.D on 09/14/2015 at 7:29 AM  Between 7am to 7pm - Pager - [phone]  After 7pm go to [email or web address] - password [facility]  [facility] -  Office  [phone]

## 2015-09-14 NOTE — Progress Notes (Signed)
09/14/15  Pharmacy- Coumadin 1550   INR 1.26 this AM.  A/P:  75yo male on Coumadin pta for hx DVT/PE, admitted with acute stroke after Coumadin held for prostate biopsy.  It was resumed 2 days ago.  Pt received dose after 2AM today, his next dose will be tomorrow.  1- Daily INR 2- Watch for s/s of bleeding.  Gracy Bruins, PharmD Clinical Pharmacist [facility]

## 2015-09-14 NOTE — Consult Note (Signed)
Neurology Consultation Reason for Consult: Stroke Referring Physician: Arnoldo Morale, Lemmie Evens  CC: Difficulty walking  History is obtained from: Patient  HPI: Jay Day is a 75 y.o. male he was in his normal state of health until around 50 AM on 4/1. He states that time he started noticing some mild difficulty with his walking. He states that he just felt unsteady when he was walking.  Of note, he was on Coumadin for history of pulmonary embolism that have been 4 years ago. He was off his anticoagulants for several days because of a prostate biopsy. He had restarted Coumadin just a day or 2 before his symptoms started.   LKW: 11 AM 4/1 tpa given?: no, out of window Premorbid modified rankin scale: 0    ROS: A 14 point ROS was performed and is negative except as noted in the HPI.   Past Medical History  Diagnosis Date  . Pulmonary embolism ([place])   . Clotting disorder ([place])   . Asthma     hx of as child  . Bronchitis     as a child  . Pituitary tumor [facility])     takes medication to manage  . Hypertension     sees [name], primary   . Elevated PSA     being monitored by physician  . Cataract   . Pituitary adenoma ([place]) 10/13/2011  . Glaucoma 10/13/2011  . 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 Chronic anticoagulation 06/11/2013  . MI (myocardial infarction) ([place])   . Peyronie's disease   . Prostatitis   . Headache disorder 02/24/2015     Family History  Problem Relation Age of Onset  . Pulmonary embolism Mother   . Kidney failure Mother   . Hypertension Mother   . Kidney Stones Mother   . Pulmonary embolism Father   . Prostate cancer Father   . Pulmonary embolism Maternal Grandmother   . Pulmonary embolism Maternal Grandfather   . Pulmonary embolism Paternal  Grandmother   . Pulmonary embolism Paternal Grandfather      Social History:  reports that he quit smoking about 45 years ago. His smoking use included Cigarettes. He has a 1.5 pack-year smoking history. He has never used smokeless tobacco. He reports that he does not drink alcohol or use illicit drugs.   Exam: Current vital signs: BP 121/72 mmHg  Pulse 68  Temp(Src) 97.6 F (36.4 C) (Oral)  Resp 20  Ht 6\' 1"  (1.854 m)  Wt 83.915 kg (185 lb)  BMI 24.41 kg/m2  SpO2 96% Vital signs in last 24 hours: Temp:  [97.6 F (36.4 C)-98 F (36.7 C)] 97.6 F (36.4 C) (04/03 0500) Pulse Rate:  [47-68] 68 (04/03 0500) Resp:  [15-20] 20 (04/03 0500) BP: (101-157)/(72-95) 121/72 mmHg (04/03 0500) SpO2:  [95 %-100 %] 96 % (04/03 0500) Weight:  [83.915 kg (185 lb)] 83.915 kg (185 lb) (04/03 0105)   Physical Exam  Constitutional: Appears well-developed and well-nourished.  Psych: Affect appropriate to situation Eyes: No scleral injection HENT: No OP obstrucion Head: Normocephalic.  Cardiovascular: Normal rate and regular rhythm.  Respiratory: Effort normal and breath sounds normal to anterior ascultation GI: Soft.  No distension. There is no tenderness.  Skin: WDI  Neuro: Mental Status: Patient is awake, alert, oriented  to person, place, month, year, and situation. Patient is able to give a clear and coherent history. No signs of aphasia or neglect Cranial Nerves: II: Visual Fields are full. Pupils are equal, round, and reactive to light.   III,IV, VI: EOMI without ptosis or diploplia.  V: Facial sensation is symmetric to temperature VII: Facial movement is symmetric.  VIII: hearing is intact to voice X: Uvula elevates symmetrically XI: Shoulder shrug is symmetric. XII: tongue is midline without atrophy or fasciculations.  Motor: Tone is normal. Bulk is normal. 5/5 strength was present in all four extremities.  Sensory: Sensation is symmetric to light touch and temperature in  the arms and legs. Deep Tendon Reflexes: 2+ and symmetric in the biceps and patellae.  Plantars: Toes are downgoing bilaterally.  Cerebellar: FNF  are intact bilaterally   I have reviewed labs in epic and the results pertinent to this consultation are: BMP-unremarkable  I have reviewed the images obtained: MRI brain-small punctate white matter infarct on the left.  Impression: 75 year old male with a small subcortical white matter infarct. This could be embolic or small vessel disease and he will need a workup to further evaluate this.  Recommendations: 1. HgbA1c, fasting lipid panel 2. CTA head and neck.  3. Frequent neuro checks 4. Echocardiogram 5. Carotid dopplers are note needed given CTA.  6. Prophylactic therapy-Coumadin 7. Risk factor modification 8. Telemetry monitoring 9. PT consult, OT consult, Speech consult 10. please page stroke NP  Or  PA  Or MD  M-F from 8am -4 pm starting 4/3 as this patient will be followed by the stroke team at this point.   You can look them up on [email or web address]      Roland Rack, MD [facility] [phone]  If 7pm- 7am, please page neurology on call as listed in [place].

## 2015-09-14 NOTE — Evaluation (Signed)
Speech Language Pathology Evaluation Patient Details Name: Jay Day MRN: [id number] DOB: [birth year] Today's Date: 09/14/2015 Time: [id number] SLP Time Calculation (min) (ACUTE ONLY): 19 min  Problem List:  Patient Active Problem List   Diagnosis Date Noted  . Elevated PSA 09/14/2015  . CVA (cerebral infarction) 09/13/2015  . 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 Abnormal stress test 05/06/2014  . Aortic atherosclerosis ([place]) 03/17/2014  . Coronary artery calcification 03/17/2014  . History of pulmonary embolism 03/17/2014  . Chronic anticoagulation 06/11/2013  . Pituitary adenoma ([place]) 10/13/2011  . Glaucoma 10/13/2011  . 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 Pituitary gland disorder ([place]) 09/11/2011  . HTN (hypertension) 09/11/2011  . Pulmonary embolism ([place]) 09/16/2010   Past Medical History:  Past Medical History  Diagnosis Date  . Pulmonary embolism ([place])   . Clotting disorder ([place])   . Asthma     hx of as child  . Bronchitis     as a child  . Pituitary tumor [facility])     takes medication to manage  . Hypertension     sees [name], primary   . Elevated PSA     being monitored by physician  . Cataract   . Pituitary adenoma ([place]) 10/13/2011  . Glaucoma 10/13/2011  . 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 Chronic anticoagulation 06/11/2013  . MI (myocardial infarction) ([place])   . Peyronie's disease   . Prostatitis   . Headache disorder 02/24/2015   Past Surgical History:  Past Surgical History  Procedure Laterality Date  . Knee surgery  1962    left   . Hernia repair  1960  . Tonsillectomy      as a child  . Eye surgery      bilateral cataract surgery  . Inguinal hernia repair  08/12/2011    Procedure:  LAPAROSCOPIC INGUINAL HERNIA;  Surgeon: [name], MD;  Location: [place];  Service: General;  Laterality: Left;  . Left heart catheterization with coronary angiogram N/A 05/06/2014    Procedure: LEFT HEART CATHETERIZATION WITH CORONARY ANGIOGRAM;  Surgeon: [name], MD;  Location: [facility] CATH LAB;  Service: Cardiovascular;  Laterality: N/A;   HPI:  75 y.o. admitted for stroke like symptoms. PMH includes glaucoma, PE, asthma, HTN, DVT, MI, granulomatosis, pituitary tumor, peyronie's disease. MRI revealed Acute deep white matter infarct affecting the LEFT posterior frontal subcortical white matter.   Assessment / Plan / Recommendation Clinical Impression  Cognitive-linguistic evaluation complete. Patient demonstrates [facility] oral motor abilities with intelligible speech.  Receptive and expressive language intact as well as cognition with ability to solve complex problems independently.  No skilled SLP follow up recommended at this time.  SLP signing off.     SLP Assessment  Patient does not need any further Speech Lanaguage Pathology Services    Follow Up Recommendations  None               SLP Evaluation Prior Functioning  Cognitive/Linguistic Baseline: Within functional limits Type of Home: House  Lives With: Spouse Available Help at Discharge: Family;Available 24 hours/day   Cognition  Overall Cognitive Status: Within Functional Limits for tasks assessed Orientation Level: Oriented X4    Comprehension  Auditory Comprehension Overall Auditory Comprehension: Appears within functional  limits for tasks assessed    Expression Expression Primary Mode of Expression: Verbal Verbal Expression Overall Verbal Expression: Appears within functional limits for tasks assessed Written Expression Dominant Hand: Left   Oral / Motor  Oral Motor/Sensory Function Overall Oral Motor/Sensory Function: Within functional limits Motor Speech Overall Motor Speech: Appears within functional  limits for tasks assessed   GO                   Carmelia Roller., CCC-SLP [id number]  Deshon Hsiao 09/14/2015, 4:31 PM

## 2015-09-14 NOTE — Care Management Note (Signed)
Case Management Note  Patient Details  Name: Jay Day MRN: [id number] Date of Birth: Sep 13, 1940  Subjective/Objective:                    Action/Plan: Patient was admitted with CVA. Lives at home with spouse.  Will follow for discharge needs pending PT/OT evals and physician orders.  Expected Discharge Date:                  Expected Discharge Plan:     In-House Referral:     Discharge planning Services     Post Acute Care Choice:    Choice offered to:     DME Arranged:    DME Agency:     HH Arranged:    HH Agency:     Status of Service:  In process, will continue to follow  Medicare Important Message Given:    Date Medicare IM Given:    Medicare IM give by:    Date Additional Medicare IM Given:    Additional Medicare Important Message give by:     If discussed at [place] of Stay Meetings, dates discussed:    Additional Comments:  Rolm Baptise, RN 09/14/2015, 10:35 AM [phone]

## 2015-09-14 NOTE — Progress Notes (Signed)
VASCULAR LAB PRELIMINARY  PRELIMINARY  PRELIMINARY  PRELIMINARY  Carotid duplex completed.    Preliminary report:  1-39% ICA plaquing.  Vertebral artery flow is antegrade.   Everline Mahaffy, RVT 09/14/2015, 9:42 AM

## 2015-09-14 NOTE — Progress Notes (Signed)
Patient admitted from [facility] E.D. Pt alert and oriented X4 oriented to room and unit   Tele verified X2.

## 2015-09-14 NOTE — Evaluation (Signed)
Occupational Therapy Evaluation Patient Details Name: Jay Day MRN: [id number] DOB: August 11, 1940 Today's Date: 09/14/2015    History of Present Illness 75 y.o. admitted for stroke like symptoms. PMH includes glaucoma, PE, asthma, HTN, DVT, MI, granulomatosis, pituitary tumor, peyronie's disease. MRI revealed Acute deep white matter infarct affecting the LEFT posterior frontal subcortical white matter.   Clinical Impression   Pt admitted with above. Education provided in session. Recommended pt get full visual field assessment done at eye doctor and to not drive until he gets this done and he can ask MD about driving. OT signing off.    Follow Up Recommendations  No OT follow up; full visual field assessment at eye doctor   Equipment Recommendations  None recommended by OT    Recommendations for Other Services       Precautions / Restrictions Restrictions Weight Bearing Restrictions: No      Mobility Bed Mobility Overal bed mobility: Independent                Transfers Overall transfer level: Independent                    Balance        Donned pants standing. Picked something off of floor. No balance deficits noted.                                     ADL Overall ADL's : Independent                                       General ADL Comments: donned pants standing and donned shirt without difficulty. Walked independently.     Vision Vision Assessment?: Yes Tracking/Visual Pursuits:  (appeared to lose pen in upper quadrant) Visual Fields:  (inconsistent especially in Lt upper quadrant ) Additional Comments: reports difficulty reading wider sized book-read some in session and pt did well. Educated on how to compensate for decreased peripheral vision and how to not lose his place with reading (using anchor, using paper to help keep place, using finger to help keep place).   Perception     Praxis      Pertinent  Vitals/Pain Pain Assessment: No/denies pain     Hand Dominance Left   Extremity/Trunk Assessment Upper Extremity Assessment Upper Extremity Assessment: RUE deficits/detail RUE Coordination: decreased fine motor;decreased gross motor (did not seem off significantly)    Lower Extremity Assessment Lower Extremity Assessment: Overall WFL for tasks assessed       Communication Communication Communication: No difficulties   Cognition Arousal/Alertness: Awake/alert Behavior During Therapy: WFL for tasks assessed/performed Overall Cognitive Status: Within Functional Limits for tasks assessed                     General Comments       Exercises  Educated on fine and gross motor coordination activities he can be doing.     Shoulder Instructions      Home Living Family/patient expects to be discharged to:: Private residence Living Arrangements: Spouse/significant other Available Help at Discharge: Family;Available 24 hours/day Type of Home: House Home Access: Level entry     Home Layout: Two level Alternate Level Stairs-Number of Steps: flight Alternate Level Stairs-Rails: Left Bathroom Shower/Tub: Teacher, early years/pre: Standard  Prior Functioning/Environment Level of Independence: Independent             OT Diagnosis: Disturbance of vision;Other (comment) (decreased coordination)   OT Problem List:     OT Treatment/Interventions:      OT Goals(Current goals can be found in the care plan section)    OT Frequency:     Barriers to D/C:            Co-evaluation              End of Session    Activity Tolerance: Patient tolerated treatment well Patient left: in bed   Time: 1057-1131 OT Time Calculation (min): 34 min Charges:  OT General Charges $OT Visit: 1 Procedure OT Evaluation $OT Eval Low Complexity: 1 Procedure G-CodesBenito Mccreedy OTR/L [id number] 09/14/2015, 1:09 PM

## 2015-09-15 LAB — PROTIME-INR
INR: 1.44 (ref 0.00–1.49)
PROTHROMBIN TIME: 17.6 s — AB (ref 11.6–15.2)

## 2015-09-15 LAB — HEMOGLOBIN A1C
HEMOGLOBIN A1C: 5.5 % (ref 4.8–5.6)
MEAN PLASMA GLUCOSE: 111 mg/dL

## 2015-09-15 MED ORDER — WARFARIN SODIUM 7.5 MG PO TABS: 7.5000 mg | ORAL_TABLET | Freq: Every day | ORAL | Status: DC

## 2015-09-15 MED ORDER — METOPROLOL SUCCINATE ER 25 MG PO TB24: 25.0000 mg | ORAL_TABLET | Freq: Every day | ORAL | Status: DC

## 2015-09-15 MED ORDER — ATORVASTATIN CALCIUM 10 MG PO TABS: 10.0000 mg | ORAL_TABLET | Freq: Every day | ORAL | Status: DC

## 2015-09-15 MED ORDER — WARFARIN SODIUM 7.5 MG PO TABS: 7.5000 mg | ORAL_TABLET | Freq: Once | ORAL | Status: DC

## 2015-09-15 MED ORDER — ASPIRIN 325 MG PO TABS: 325.0000 mg | ORAL_TABLET | Freq: Every day | ORAL | Status: DC

## 2015-09-15 NOTE — Progress Notes (Signed)
ANTICOAGULATION CONSULT NOTE - Follow Up Consult  Pharmacy Consult for Coumadin Indication: hx DVT/PE, now with acute stroke  Allergies  Allergen Reactions  . Gluten Meal     Gluten Sensitivity     Patient Measurements: Height: 6\' 1"  (185.4 cm) Weight: 185 lb (83.915 kg) IBW/kg (Calculated) : 79.9  Vital Signs: Temp: 97.8 F (36.6 C) (04/04 0954) Temp Source: Oral (04/04 0954) BP: 118/72 mmHg (04/04 0954) Pulse Rate: 63 (04/04 0954)  Labs:  Recent Labs  09/13/15 1153 09/13/15 2234 09/14/15 0618 09/15/15 0440  HGB 14.4  --   --   --   HCT 40.7  --   --   --   PLT 141*  --   --   --   LABPROT  --  14.7 16.0* 17.6*  INR  --  1.13 1.26 1.44  CREATININE 0.83  --   --   --     Estimated Creatinine Clearance: 89.6 mL/min (by C-G formula based on Cr of 0.83).  Assessment: 75yom on coumadn pta for hx DVT/PE, admitted with acute embolic stroke after coumadin held for prostate biopsy. Coumadin resumed at home 4/1. INR on admit 4/2 was 1.13. Today's INR has trended up to 1.44 after higher 7.5mg  dose given.   PTA dose: 5mg  daily  Goal of Therapy:  INR 2-3 Monitor platelets by anticoagulation protocol: Yes   Plan:  1) Repeat coumadin 7.5mg  x 1 2) Daily INR  Deboraha Sprang 09/15/2015,10:15 AM

## 2015-09-15 NOTE — Progress Notes (Signed)
Pt discharged home with wife. Telemetry discontinued and IV removed. Discharge instructions given. Pt questions asked and answered. Pt left unit via wheelchair at 1640. [name]

## 2015-09-15 NOTE — Care Management Note (Signed)
Case Management Note  Patient Details  Name: DAMARIE BREIG MRN: [id number] Date of Birth: 1940/07/01  Subjective/Objective:                    Action/Plan: Patient discharging home with self care. No further needs per CM.  Expected Discharge Date:                  Expected Discharge Plan:  Home/Self Care  In-House Referral:     Discharge planning Services     Post Acute Care Choice:    Choice offered to:     DME Arranged:    DME Agency:     HH Arranged:    [place] Agency:     Status of Service:  Completed, signed off  Medicare Important Message Given:    Date Medicare IM Given:    Medicare IM give by:    Date Additional Medicare IM Given:    Additional Medicare Important Message give by:     If discussed at [place] of Stay Meetings, dates discussed:    Additional Comments:  Pollie Friar, RN 09/15/2015, 2:35 PM

## 2015-09-15 NOTE — Discharge Instructions (Signed)
Information on my medicine - Coumadin   (Warfarin)  This medication education was reviewed with me or my healthcare representative as part of my discharge preparation.  The pharmacist that spoke with me during my hospital stay was:  Deboraha Sprang, [facility]  Why was Coumadin prescribed for you? Coumadin was prescribed for you because you have a blood clot or a medical condition that can cause an increased risk of forming blood clots. Blood clots can cause serious health problems by blocking the flow of blood to the heart, lung, or brain. Coumadin can prevent harmful blood clots from forming. As a reminder your indication for Coumadin is:   Select from menuhx DVT/PE What test will check on my response to Coumadin? While on Coumadin (warfarin) you will need to have an INR test regularly to ensure that your dose is keeping you in the desired range. The INR (international normalized ratio) number is calculated from the result of the laboratory test called prothrombin time (PT).  If an INR APPOINTMENT HAS NOT ALREADY BEEN MADE FOR YOU please schedule an appointment to have this lab work done by your health care provider within 7 days. Your INR goal is usually a number between:  2 to 3 or your provider may give you a more narrow range like 2-2.5.  Ask your health care provider during an office visit what your goal INR is.  What  do you need to  know  About  COUMADIN? Take Coumadin (warfarin) exactly as prescribed by your healthcare provider about the same time each day.  DO NOT stop taking without talking to the doctor who prescribed the medication.  Stopping without other blood clot prevention medication to take the place of Coumadin may increase your risk of developing a new clot or stroke.  Get refills before you run out.  What do you do if you miss a dose? If you miss a dose, take it as soon as you remember on the same day then continue your regularly scheduled regimen the next day.  Do not take two  doses of Coumadin at the same time.  Important Safety Information A possible side effect of Coumadin (Warfarin) is an increased risk of bleeding. You should call your healthcare provider right away if you experience any of the following: ? Bleeding from an injury or your nose that does not stop. ? Unusual colored urine (red or dark brown) or unusual colored stools (red or black). ? Unusual bruising for unknown reasons. ? A serious fall or if you hit your head (even if there is no bleeding).  Some foods or medicines interact with Coumadin (warfarin) and might alter your response to warfarin. To help avoid this: ? Eat a balanced diet, maintaining a consistent amount of Vitamin K. ? Notify your provider about major diet changes you plan to make. ? Avoid alcohol or limit your intake to 1 drink for women and 2 drinks for men per day. (1 drink is 5 oz. wine, 12 oz. beer, or 1.5 oz. liquor.)  Make sure that ANY health care provider who prescribes medication for you knows that you are taking Coumadin (warfarin).  Also make sure the healthcare provider who is monitoring your Coumadin knows when you have started a new medication including herbals and non-prescription products.  Coumadin (Warfarin)  Major Drug Interactions  Increased Warfarin Effect Decreased Warfarin Effect  Alcohol (large quantities) Antibiotics (esp. Septra/Bactrim, Flagyl, Cipro) Amiodarone (Cordarone) Aspirin (ASA) Cimetidine (Tagamet) Megestrol (Megace) NSAIDs (ibuprofen, naproxen, etc.) Piroxicam (  Feldene) °Propafenone (Rythmol SR) °Propranolol (Inderal) °Isoniazid (INH) °Posaconazole (Noxafil) Barbiturates (Phenobarbital) °Carbamazepine (Tegretol) °Chlordiazepoxide (Librium) °Cholestyramine (Questran) °Griseofulvin °Oral Contraceptives °Rifampin °Sucralfate (Carafate) °Vitamin K  ° °Coumadin® (Warfarin) Major Herbal Interactions  °Increased Warfarin Effect Decreased Warfarin Effect  °Garlic °Ginseng °Ginkgo biloba Coenzyme  Q10 °Green tea °St. John’s wort   ° °Coumadin® (Warfarin) FOOD Interactions  °Eat a consistent number of servings per week of foods HIGH in Vitamin K °(1 serving = ½ cup)  °Collards (cooked, or boiled & drained) °Kale (cooked, or boiled & drained) °Mustard greens (cooked, or boiled & drained) °Parsley *serving size only = ¼ cup °Spinach (cooked, or boiled & drained) °Swiss chard (cooked, or boiled & drained) °Turnip greens (cooked, or boiled & drained)  °Eat a consistent number of servings per week of foods MEDIUM-HIGH in Vitamin K °(1 serving = 1 cup)  °Asparagus (cooked, or boiled & drained) °Broccoli (cooked, boiled & drained, or raw & chopped) °Brussel sprouts (cooked, or boiled & drained) *serving size only = ½ cup °Lettuce, raw (green leaf, endive, romaine) °Spinach, raw °Turnip greens, raw & chopped  ° °These websites have more information on Coumadin (warfarin):  www.coumadin.com; °www.ahrq.gov/consumer/coumadin.htm; ° ° °

## 2015-09-16 NOTE — Discharge Summary (Signed)
Physician Discharge Summary  Jay Day [id number] DOB: 11/27/1940 DOA: 09/13/2015  PCP: [name], MD  Admit date: 09/13/2015 Discharge date: 09/15/2015  Time spent: 30 minutes  Recommendations for Outpatient Follow-up:  1. Follow up with PCP in one week.  2. Please follow up with neurology as recommended.  3. Please check INR by the end of the week.    Discharge Diagnoses:  Principal Problem:   CVA (cerebral infarction) Active Problems:   Pituitary adenoma (HCC)   Chronic anticoagulation   History of pulmonary embolism   Elevated PSA   Cerebral infarction due to unspecified mechanism   Discharge Condition: improved  Diet recommendation: low sodium diet.   Filed Weights   09/13/15 1154 09/14/15 0105  Weight: 83.915 kg (185 lb) 83.915 kg (185 lb)    History of present illness:  Jay Day is a 75 y.o. male he was in his normal state of health until around 10 AM on 09/12/2015, presented to ED with complaints to light headedness and dizziness. He was found to have a left post frontal subcortical white matter infarct secondary to small vessel disease.   Hospital Course:  CVA: Left post frontal subcortical white matter infarct.  CT head and neck negative.  Carotid duplex no significant stenosis.  Echo does not show any embolus.  Therapy evals done.  Resume anticoagulation and aspirin till the INR is therapeutic, after which the aspirin can be discontinued.   Pulmonary embolism: Resume coumadin.   Procedures:  MRI brain.   Consultations:  Neurology.   Discharge Exam: Filed Vitals:   09/15/15 0954 09/15/15 1430  BP: 118/72 112/66  Pulse: 63 56  Temp: 97.8 F (36.6 C) 98.4 F (36.9 C)  Resp: 20 20    General: alert afebrile comfortable.  Cardiovascular: s1s2 Respiratory: ctab.  Discharge Instructions   Discharge Instructions    Ambulatory referral to Neurology    Complete by:  As directed   Please schedule post stroke follow up in 2  months.     Diet - low sodium heart healthy    Complete by:  As directed      Discharge instructions    Complete by:  As directed   Follow up with neurology as recommended.  Please check INR in 2 days,and if INR is greater than 2, please stop the aspirin.  Please follow up with opthalmology in one week.          Discharge Medication List as of 09/15/2015  3:04 PM    START taking these medications   Details  aspirin 325 MG tablet Take 1 tablet (325 mg total) by mouth daily., Starting 09/15/2015, Until Discontinued, Print    atorvastatin (LIPITOR) 10 MG tablet Take 1 tablet (10 mg total) by mouth daily at 6 PM., Starting 09/15/2015, Until Discontinued, Print      CONTINUE these medications which have CHANGED   Details  metoprolol succinate (TOPROL-XL) 25 MG 24 hr tablet Take 1 tablet (25 mg total) by mouth daily., Starting 09/15/2015, Until Discontinued, Print    warfarin (COUMADIN) 7.5 MG tablet Take 1 tablet (7.5 mg total) by mouth daily at 6 PM., Starting 09/15/2015, Until Discontinued, Print      CONTINUE these medications which have NOT CHANGED   Details  bromocriptine (PARLODEL) 2.5 MG tablet Take 1.25 mg by mouth 3 (three) times a week. Monday, Wednesday,and Fridays, Starting 09/07/2014, Until Discontinued, Historical Med    captopril (CAPOTEN) 25 MG tablet Take 25 mg by mouth daily. ,  Until Discontinued, Historical Med    Copper Gluconate (COPPER CAPS PO) Take 2 capsules by mouth daily., Until Discontinued, Historical Med    Cyanocobalamin (VITAMIN B-12 PO) Take 1 tablet by mouth daily., Until Discontinued, Historical Med    latanoprost (XALATAN) 0.005 % ophthalmic solution place 1 drop into both eyes at bedtime PLS. SCHEDULE AN APPOINTMENT BEFORE FUTURE REFILLS, Historical Med    Melatonin 3 MG TABS Take 3 mg by mouth at bedtime., Until Discontinued, Historical Med    Probiotic CAPS Take 1 capsule by mouth daily., Until Discontinued, Historical Med       Allergies  Allergen  Reactions  . Gluten Meal     Gluten Sensitivity    Follow-up Information    Follow up with SETHI,PRAMOD, MD In 2 months.   Specialties:  Neurology, Radiology   Why:  Stroke [facility], Office will call you with appointment date & time   Contact information:   [address] [place] [address] [phone]       Follow up with [name], MD. Schedule an appointment as soon as possible for a visit in 1 week.   Specialty:  Internal Medicine   Contact information:   [address] [place] [address] [phone]        The results of significant diagnostics from this hospitalization (including imaging, microbiology, ancillary and laboratory) are listed below for reference.    Significant Diagnostic Studies: Ct Angio Head W/cm &/or Wo Cm  09/14/2015  CLINICAL DATA:  Stroke.  Gait abnormality. EXAM: CT ANGIOGRAPHY HEAD AND NECK TECHNIQUE: Multidetector CT imaging of the head and neck was performed using the standard protocol during bolus administration of intravenous contrast. Multiplanar CT image reconstructions and MIPs were obtained to evaluate the vascular anatomy. Carotid stenosis measurements (when applicable) are obtained utilizing NASCET criteria, using the distal internal carotid diameter as the denominator. CONTRAST:  50 mL Isovue 370 IV COMPARISON:  MRI head 09/13/2015 FINDINGS: CT HEAD Brain: Mild atrophy. Negative for acute infarct. Small acute infarct in the left posterior frontal white matter is noted on MRI but not identified on CT. Small chronic infarct in the left basal ganglia. Small chronic infarct left parietal white matter. Negative for hemorrhage or mass. Calvarium and skull base: Negative Paranasal sinuses: Mucosal edema in the paranasal sinuses without air-fluid level. Orbits: Negative for mass lesion. CTA NECK Aortic arch: Mild atherosclerotic disease in the aortic arch. No aneurysm or dissection Multiple subpleural lung densities in the  apices bilaterally most likely due to scarring. Right carotid system: Right common carotid artery widely patent. Minimal atherosclerotic calcification in the carotid bulb on the right without significant stenosis Left carotid system: Left common carotid artery widely patent. Mild atherosclerotic plaque in the carotid bulb on the left without significant stenosis. Vertebral arteries:Right vertebral artery dominant. Both vertebral arteries patent to the basilar without significant stenosis or dissection. Skeleton: Coronary cervical spine degenerative change. No fracture or bone lesion. Other neck: Negative for mass or adenopathy in the neck. CTA HEAD Anterior circulation: Mild atherosclerotic disease in the cavernous carotid bilaterally without significant stenosis. Anterior and middle cerebral arteries patent bilaterally without stenosis. Posterior circulation: Both vertebral arteries patent to the basilar. No significant vertebral stenosis. PICA, superior cerebellar, and posterior cerebral arteries patent bilaterally. Venous sinuses: Patent Anatomic variants: None Delayed phase: Normal enhancement on delayed imaging IMPRESSION: No significant carotid or vertebral artery stenosis in the neck. No significant intracranial stenosis or aneurysm. Electronically Signed   By:  Franchot Gallo M.D.   On: 09/14/2015 10:57   Dg Chest 2 View  09/13/2015  CLINICAL DATA:  Dizziness starting yesterday, chest pain. History of MI, pulmonary embolism, hypertension and pituitary adenoma. Former smoker. EXAM: CHEST  2 VIEW COMPARISON:  Chest x-ray dated 09/11/2011. FINDINGS: Lungs are at least mildly hyperexpanded indicating COPD. Suspect associated chronic bronchitic changes centrally. Probable mild scarring at each lung base. Lungs otherwise clear. No evidence of pneumonia. No pleural effusion or pneumothorax seen. Heart size is normal. Overall cardiomediastinal silhouette is stable in size and configuration. Osseous and soft tissue  structures about the chest are unremarkable. IMPRESSION: 1. Lungs at least mildly hyperexpanded suggesting COPD/emphysema. Suspect associated chronic bronchitic changes centrally. 2. Overall, no acute findings. Probable mild scarring/fibrosis at each lung base. No evidence of pneumonia. Electronically Signed   By: Franki Cabot M.D.   On: 09/13/2015 12:53   Ct Angio Neck W/cm &/or Wo/cm  09/14/2015  CLINICAL DATA:  Stroke.  Gait abnormality. EXAM: CT ANGIOGRAPHY HEAD AND NECK TECHNIQUE: Multidetector CT imaging of the head and neck was performed using the standard protocol during bolus administration of intravenous contrast. Multiplanar CT image reconstructions and MIPs were obtained to evaluate the vascular anatomy. Carotid stenosis measurements (when applicable) are obtained utilizing NASCET criteria, using the distal internal carotid diameter as the denominator. CONTRAST:  50 mL Isovue 370 IV COMPARISON:  MRI head 09/13/2015 FINDINGS: CT HEAD Brain: Mild atrophy. Negative for acute infarct. Small acute infarct in the left posterior frontal white matter is noted on MRI but not identified on CT. Small chronic infarct in the left basal ganglia. Small chronic infarct left parietal white matter. Negative for hemorrhage or mass. Calvarium and skull base: Negative Paranasal sinuses: Mucosal edema in the paranasal sinuses without air-fluid level. Orbits: Negative for mass lesion. CTA NECK Aortic arch: Mild atherosclerotic disease in the aortic arch. No aneurysm or dissection Multiple subpleural lung densities in the apices bilaterally most likely due to scarring. Right carotid system: Right common carotid artery widely patent. Minimal atherosclerotic calcification in the carotid bulb on the right without significant stenosis Left carotid system: Left common carotid artery widely patent. Mild atherosclerotic plaque in the carotid bulb on the left without significant stenosis. Vertebral arteries:Right vertebral artery  dominant. Both vertebral arteries patent to the basilar without significant stenosis or dissection. Skeleton: Coronary cervical spine degenerative change. No fracture or bone lesion. Other neck: Negative for mass or adenopathy in the neck. CTA HEAD Anterior circulation: Mild atherosclerotic disease in the cavernous carotid bilaterally without significant stenosis. Anterior and middle cerebral arteries patent bilaterally without stenosis. Posterior circulation: Both vertebral arteries patent to the basilar. No significant vertebral stenosis. PICA, superior cerebellar, and posterior cerebral arteries patent bilaterally. Venous sinuses: Patent Anatomic variants: None Delayed phase: Normal enhancement on delayed imaging IMPRESSION: No significant carotid or vertebral artery stenosis in the neck. No significant intracranial stenosis or aneurysm. Electronically Signed   By: Franchot Gallo M.D.   On: 09/14/2015 10:57   Mr Brain Wo Contrast  09/13/2015  CLINICAL DATA:  Dizziness which began yesterday. Recent prostate biopsy. Gait with balance issues. EXAM: MRI HEAD WITHOUT CONTRAST TECHNIQUE: Multiplanar, multiecho pulse sequences of the brain and surrounding structures were obtained without intravenous contrast. COMPARISON:  MR brain 03/13/2015. FINDINGS: There is an acute deep white matter infarct affecting the LEFT posterior frontal subcortical white matter, 5 x 7 mm in size, nonhemorrhagic. No other areas of acute infarction are seen, with specific attention to the posterior circulation.  No mass lesion, hydrocephalus, or extra-axial fluid. Normal for age cerebral volume. Mild T2 and FLAIR hyperintensities throughout the periventricular and subcortical white matter, likely chronic microvascular ischemic change. Remote lacunar infarct, LEFT basal ganglia. No midline abnormality. Flow voids are maintained. RIGHT vertebral dominant. Chronic sinus disease. BILATERAL cataract extraction. Compared with the prior MR, this is  a new finding. IMPRESSION: Acute deep white matter infarct affecting the LEFT posterior frontal subcortical white matter, 5 x 7 mm in size, nonhemorrhagic. Atrophy and small vessel disease. Electronically Signed   By: Staci Righter M.D.   On: 09/13/2015 19:11    Microbiology: No results found for this or any previous visit (from the past 240 hour(s)).   Labs: Basic Metabolic Panel:  Recent Labs Lab 09/13/15 1153  NA 143  K 3.7  CL 109  CO2 29  GLUCOSE 94  BUN 13  CREATININE 0.83  CALCIUM 9.5   Liver Function Tests: No results for input(s): AST, ALT, ALKPHOS, BILITOT, PROT, ALBUMIN in the last 168 hours. No results for input(s): LIPASE, AMYLASE in the last 168 hours. No results for input(s): AMMONIA in the last 168 hours. CBC:  Recent Labs Lab 09/13/15 1153  WBC 5.8  HGB 14.4  HCT 40.7  MCV 94.2  PLT 141*   Cardiac Enzymes: No results for input(s): CKTOTAL, CKMB, CKMBINDEX, TROPONINI in the last 168 hours. BNP: BNP (last 3 results) No results for input(s): BNP in the last 8760 hours.  ProBNP (last 3 results) No results for input(s): PROBNP in the last 8760 hours.  CBG: No results for input(s): GLUCAP in the last 168 hours.     SignedHosie Poisson MD.  [facility] 09/16/2015, 8:17 AM

## 2015-09-17 ENCOUNTER — Emergency Department (HOSPITAL_COMMUNITY): Payer: Medicare Other

## 2015-09-17 ENCOUNTER — Emergency Department (HOSPITAL_COMMUNITY)
Admission: EM | Admit: 2015-09-17 | Discharge: 2015-09-17 | Disposition: A | Discharge status: Home / Self Care | Payer: Medicare Other | Attending: Emergency Medicine | Admitting: Emergency Medicine

## 2015-09-17 DIAGNOSIS — Z87438 Personal history of other diseases of male genital organs: Secondary | ICD-10-CM | POA: Diagnosis not present

## 2015-09-17 DIAGNOSIS — Z86711 Personal history of pulmonary embolism: Secondary | ICD-10-CM | POA: Insufficient documentation

## 2015-09-17 DIAGNOSIS — Z79899 Other long term (current) drug therapy: Secondary | ICD-10-CM | POA: Insufficient documentation

## 2015-09-17 DIAGNOSIS — Z86018 Personal history of other benign neoplasm: Secondary | ICD-10-CM | POA: Diagnosis not present

## 2015-09-17 DIAGNOSIS — Z86718 Personal history of other venous thrombosis and embolism: Secondary | ICD-10-CM | POA: Diagnosis not present

## 2015-09-17 DIAGNOSIS — Z7982 Long term (current) use of aspirin: Secondary | ICD-10-CM | POA: Diagnosis not present

## 2015-09-17 DIAGNOSIS — J45909 Unspecified asthma, uncomplicated: Secondary | ICD-10-CM | POA: Diagnosis not present

## 2015-09-17 DIAGNOSIS — R42 Dizziness and giddiness: Secondary | ICD-10-CM | POA: Insufficient documentation

## 2015-09-17 DIAGNOSIS — Z8673 Personal history of transient ischemic attack (TIA), and cerebral infarction without residual deficits: Secondary | ICD-10-CM | POA: Diagnosis not present

## 2015-09-17 DIAGNOSIS — I252 Old myocardial infarction: Secondary | ICD-10-CM | POA: Insufficient documentation

## 2015-09-17 DIAGNOSIS — H409 Unspecified glaucoma: Secondary | ICD-10-CM | POA: Diagnosis not present

## 2015-09-17 DIAGNOSIS — Z87891 Personal history of nicotine dependence: Secondary | ICD-10-CM | POA: Insufficient documentation

## 2015-09-17 DIAGNOSIS — Z7901 Long term (current) use of anticoagulants: Secondary | ICD-10-CM | POA: Diagnosis not present

## 2015-09-17 DIAGNOSIS — I10 Essential (primary) hypertension: Secondary | ICD-10-CM | POA: Insufficient documentation

## 2015-09-17 DIAGNOSIS — Z862 Personal history of diseases of the blood and blood-forming organs and certain disorders involving the immune mechanism: Secondary | ICD-10-CM | POA: Insufficient documentation

## 2015-09-17 LAB — APTT: aPTT: 33 seconds (ref 24–37)

## 2015-09-17 LAB — COMPREHENSIVE METABOLIC PANEL
ALK PHOS: 58 U/L (ref 38–126)
ALT: 16 U/L — AB (ref 17–63)
AST: 24 U/L (ref 15–41)
Albumin: 3.6 g/dL (ref 3.5–5.0)
Anion gap: 9 (ref 5–15)
BILIRUBIN TOTAL: 0.6 mg/dL (ref 0.3–1.2)
BUN: 12 mg/dL (ref 6–20)
CALCIUM: 9.1 mg/dL (ref 8.9–10.3)
CO2: 25 mmol/L (ref 22–32)
CREATININE: 0.91 mg/dL (ref 0.61–1.24)
Chloride: 108 mmol/L (ref 101–111)
Glucose, Bld: 139 mg/dL — ABNORMAL HIGH (ref 65–99)
Potassium: 3.4 mmol/L — ABNORMAL LOW (ref 3.5–5.1)
Sodium: 142 mmol/L (ref 135–145)
TOTAL PROTEIN: 6.3 g/dL — AB (ref 6.5–8.1)

## 2015-09-17 LAB — I-STAT CHEM 8, ED
BUN: 15 mg/dL (ref 6–20)
CALCIUM ION: 1.24 mmol/L (ref 1.13–1.30)
CREATININE: 0.8 mg/dL (ref 0.61–1.24)
Chloride: 104 mmol/L (ref 101–111)
GLUCOSE: 127 mg/dL — AB (ref 65–99)
HCT: 43 % (ref 39.0–52.0)
HEMOGLOBIN: 14.6 g/dL (ref 13.0–17.0)
Potassium: 3.4 mmol/L — ABNORMAL LOW (ref 3.5–5.1)
Sodium: 144 mmol/L (ref 135–145)
TCO2: 26 mmol/L (ref 0–100)

## 2015-09-17 LAB — DIFFERENTIAL
BASOS PCT: 1 %
Basophils Absolute: 0 10*3/uL (ref 0.0–0.1)
Eosinophils Absolute: 0.4 10*3/uL (ref 0.0–0.7)
Eosinophils Relative: 6 %
LYMPHS ABS: 1.8 10*3/uL (ref 0.7–4.0)
LYMPHS PCT: 30 %
MONO ABS: 0.4 10*3/uL (ref 0.1–1.0)
MONOS PCT: 6 %
NEUTROS ABS: 3.3 10*3/uL (ref 1.7–7.7)
Neutrophils Relative %: 57 %

## 2015-09-17 LAB — CBC
HEMATOCRIT: 41.1 % (ref 39.0–52.0)
HEMOGLOBIN: 13.8 g/dL (ref 13.0–17.0)
MCH: 32.2 pg (ref 26.0–34.0)
MCHC: 33.6 g/dL (ref 30.0–36.0)
MCV: 95.8 fL (ref 78.0–100.0)
Platelets: 152 10*3/uL (ref 150–400)
RBC: 4.29 MIL/uL (ref 4.22–5.81)
RDW: 12.2 % (ref 11.5–15.5)
WBC: 5.9 10*3/uL (ref 4.0–10.5)

## 2015-09-17 LAB — I-STAT TROPONIN, ED: TROPONIN I, POC: 0 ng/mL (ref 0.00–0.08)

## 2015-09-17 LAB — PROTIME-INR
INR: 1.77 — ABNORMAL HIGH (ref 0.00–1.49)
Prothrombin Time: 20.6 seconds — ABNORMAL HIGH (ref 11.6–15.2)

## 2015-09-17 NOTE — ED Notes (Signed)
Pt reports being admitted over the weekend & dx with a CVA, pt discharged home x 2 days ago, pt reports dizziness today @ 13:00, pt reports recently taken off Coumadin for a prostate procedure last Weds, pt reports starting to take Coumadin again last Friday, pt reports INR 1.9 today, pt denies blurred vision & slurred speech, pt A&O x4, follows commands, speaks in complete sentences

## 2015-09-17 NOTE — ED Notes (Signed)
Pt still OTF

## 2015-09-17 NOTE — ED Provider Notes (Signed)
7:20 PM Called to triage to evaluate possible Code Stroke. Patient with lightheadedness since 1 pm. Worse with standing but currently present. Feels like he might pass out. Similar to a few days ago when he was admitted and discharged with an MRI positive CVA in left frontal lobe. No focal neuro deficits on exam, equal strength. Normal gait. Is back on coumadin currently. D/w Neuro, Dr. Cristobal Goldmann, who states he would not call a code stroke and instead workup with repeat MRI. If no significant change, can be discharged home. Likely this is secondary to the stroke he suffered a few days ago and could have symptoms on off for a week or 2.  Sherwood Gambler, MD 09/17/15 [phone]

## 2015-09-17 NOTE — ED Notes (Signed)
Jay Skeeter, MD spoke with pt & is consulting with neurology

## 2015-09-17 NOTE — ED Notes (Signed)
Jay Skeeter, MD notified to eval pt to determine if a Code Stroke needs to be called

## 2015-09-17 NOTE — ED Provider Notes (Addendum)
CSN: [id number]     Arrival date & time 09/17/15  1822 History   First MD Initiated Contact with Patient 09/17/15 2009     Chief Complaint  Patient presents with  . Dizziness     (Consider location/radiation/quality/duration/timing/severity/associated sxs/prior Treatment) HPI  75 year old male presents with acute lightheadedness that started at 1 PM. This is similar to what he had last weekend when he was diagnosed with an acute stroke. This was positive on an MRI. Symptoms had later resolved. Came back at 1 PM today. No headaches, vomiting, blurry vision, or focal weakness/numbness. No trouble ambulating. It is a constant sensation but worse with standing. Feels more like getting rate up as out rather than a room spinning sensation but he doesn't think is close to passing out. Was restarted on Coumadin after most recent stroke.  Past Medical History  Diagnosis Date  . Pulmonary embolism ([place])   . Clotting disorder ([place])   . Asthma     hx of as child  . Bronchitis     as a child  . Pituitary tumor [facility])     takes medication to manage  . Hypertension     sees [name], primary   . Elevated PSA     being monitored by physician  . Cataract   . 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 Glaucoma 10/13/2011  . 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 Chronic anticoagulation 06/11/2013  . MI (myocardial infarction) ([place])   . Peyronie's disease   . Prostatitis   . Headache disorder 02/24/2015   Past Surgical History  Procedure Laterality Date  . Knee surgery  1962    left   . Hernia repair  1960  . Tonsillectomy      as a child  . Eye surgery      bilateral cataract surgery  . Inguinal hernia repair  08/12/2011    Procedure: LAPAROSCOPIC INGUINAL HERNIA;  Surgeon: [name], MD;  Location: [facility];  Service: General;  Laterality: Left;  . Left heart catheterization with coronary angiogram N/A 05/06/2014    Procedure: LEFT HEART CATHETERIZATION WITH CORONARY ANGIOGRAM;  Surgeon: [name], MD;  Location: [facility] CATH LAB;  Service: Cardiovascular;  Laterality: N/A;   Family History  Problem Relation Age of Onset  . Pulmonary embolism Mother   . Kidney failure Mother   . Hypertension Mother   . Kidney Stones Mother   . Pulmonary embolism Father   . Prostate cancer Father   . Pulmonary embolism Maternal Grandmother   . Pulmonary embolism Maternal Grandfather   . Pulmonary embolism Paternal Grandmother   . Pulmonary embolism Paternal Grandfather    Social History  Substance Use Topics  . Smoking status: Former Smoker -- 0.30 packs/day for 5 years    Types: Cigarettes    Quit date: 06/13/1970  . Smokeless tobacco: Never Used  . Alcohol Use: No     Comment: very little    Review of Systems  Eyes: Negative for visual disturbance.  Cardiovascular: Negative for chest pain.  Gastrointestinal: Negative for abdominal pain.  Neurological: Positive for light-headedness. Negative for weakness, numbness and headaches.  All other systems reviewed and are negative.     Allergies  Gluten meal  Home Medications   Prior to  Admission medications   Medication Sig Start Date End Date Taking? Authorizing Provider  aspirin 325 MG tablet Take 1 tablet (325 mg total) by mouth daily. 09/15/15   Hosie Poisson, MD  atorvastatin (LIPITOR) 10 MG tablet Take 1 tablet (10 mg total) by mouth daily at 6 PM. 09/15/15   Hosie Poisson, MD  bromocriptine (PARLODEL) 2.5 MG tablet Take 1.25 mg by mouth 3 (three) times a week. Monday, Wednesday,and Fridays 09/07/14   Historical Provider, MD  captopril (CAPOTEN) 25 MG tablet Take 25 mg by mouth daily.     Historical Provider, MD  Copper Gluconate (COPPER CAPS PO) Take 2 capsules by mouth daily.    Historical Provider, MD  Cyanocobalamin  (VITAMIN B-12 PO) Take 1 tablet by mouth daily.    Historical Provider, MD  latanoprost (XALATAN) 0.005 % ophthalmic solution place 1 drop into both eyes at bedtime PLS. SCHEDULE AN APPOINTMENT BEFORE FUTURE REFILLS 03/30/15   Historical Provider, MD  Melatonin 3 MG TABS Take 3 mg by mouth at bedtime.    Historical Provider, MD  metoprolol succinate (TOPROL-XL) 25 MG 24 hr tablet Take 1 tablet (25 mg total) by mouth daily. 09/15/15   Hosie Poisson, MD  Probiotic CAPS Take 1 capsule by mouth daily.    Historical Provider, MD  warfarin (COUMADIN) 7.5 MG tablet Take 1 tablet (7.5 mg total) by mouth daily at 6 PM. 09/15/15   Hosie Poisson, MD   BP 140/88 mmHg  Pulse 55  Temp(Src) 98 F (36.7 C) (Oral)  Resp 12  Ht 6\' 1"  (1.854 m)  Wt 190 lb 8 oz (86.41 kg)  BMI 25.14 kg/m2  SpO2 95% Physical Exam  Constitutional: He is oriented to person, place, and time. He appears well-developed and well-nourished.  HENT:  Head: Normocephalic and atraumatic.  Right Ear: External ear normal.  Left Ear: External ear normal.  Nose: Nose normal.  Eyes: Right eye exhibits no discharge. Left eye exhibits no discharge.  Neck: Neck supple.  Cardiovascular: Normal rate, regular rhythm, normal heart sounds and intact distal pulses.   Pulmonary/Chest: Effort normal and breath sounds normal.  Abdominal: Soft. There is no tenderness.  Musculoskeletal: He exhibits no edema.  Neurological: He is alert and oriented to person, place, and time.  CN 2-12 grossly intact. 5/5 strength in all 4 extremities. Grossly normal sensation. Normal finger to nose. Normal gait  Skin: Skin is warm and dry.  Nursing note and vitals reviewed.   ED Course  Procedures (including critical care time) Labs Review Labs Reviewed  PROTIME-INR - Abnormal; Notable for the following:    Prothrombin Time 20.6 (*)    INR 1.77 (*)    All other components within normal limits  COMPREHENSIVE METABOLIC PANEL - Abnormal; Notable for the following:     Potassium 3.4 (*)    Glucose, Bld 139 (*)    Total Protein 6.3 (*)    ALT 16 (*)    All other components within normal limits  I-STAT CHEM 8, ED - Abnormal; Notable for the following:    Potassium 3.4 (*)    Glucose, Bld 127 (*)    All other components within normal limits  APTT  CBC  DIFFERENTIAL  I-STAT TROPOININ, ED  CBG MONITORING, ED    Imaging Review Ct Head Wo Contrast  09/17/2015  CLINICAL DATA:  Persistent dizziness since Saturday. EXAM: CT HEAD WITHOUT CONTRAST TECHNIQUE: Contiguous axial images were obtained from the base of the skull through the vertex without intravenous contrast.  COMPARISON:  09/14/2015 FINDINGS: Stable age related cerebral atrophy, ventriculomegaly and periventricular white matter disease. Stable small lacunar type infarct in the left basal ganglia. No extra-axial fluid collections are identified. No CT findings for acute hemispheric infarction or intracranial hemorrhage. No mass lesions. The brainstem and cerebellum are normal. No significant bony findings. Scattered mucoperiosteal thickening in the frontal, ethmoid and maxillary sinuses. The mastoid air cells and middle ear cavities are clear. IMPRESSION: Stable mild periventricular white matter disease and remote lacunar-type basal ganglia infarct on the left. No findings for acute hemispheric infarction or intracranial hemorrhage. Electronically Signed   By: Marijo Sanes M.D.   On: 09/17/2015 20:38   Mr Brain Wo Contrast (neuro Protocol)  09/17/2015  CLINICAL DATA:  Lightheadedness beginning at 1 p.m., presyncope. Similar symptoms associated with prior stroke. Patient is on anticoagulation. History of pituitary tumor, hypertension, pulmonary embolism. EXAM: MRI HEAD WITHOUT CONTRAST TECHNIQUE: Multiplanar, multiecho pulse sequences of the brain and surrounding structures were obtained without intravenous contrast. COMPARISON:  CT head September 17, 2015 at 2017 hours and MRI of the head September 13, 2015 FINDINGS:  The ventricles and sulci are normal for patient's age. No abnormal parenchymal signal, mass lesions, mass effect. Fall LEFT corona radiata/ basal ganglia lacunar infarct. No reduced diffusion to suggest acute ischemia. Subcentimeter focus of reduced diffusion re- demonstrated LEFT frontal subcortical white matter now with normalized ADC values. A few scattered subcentimeter supratentorial white matter FLAIR T2 hyperintensities most compatible with mild chronic small vessel ischemic disease, less than expected for age. No susceptibility artifact to suggest hemorrhage. No abnormal extra-axial fluid collections. No extra-axial masses though, contrast enhanced sequences would be more sensitive. Normal major intracranial vascular flow voids seen at the skull base. Status post bilateral ocular lens implants. No abnormal sellar expansion. No suspicious calvarial bone marrow signal. Craniocervical junction maintained. Mild paranasal sinus mucosal thickening without air-fluid levels. The mastoid air cells are well aerated. IMPRESSION: No acute intracranial process. Evolving subacute to chronic LEFT frontal subcentimeter infarct. Stable chronic changes including old LEFT basal ganglia lacunar infarct. Electronically Signed   By: Elon Alas M.D.   On: 09/17/2015 22:39   I have personally reviewed and evaluated these images and lab results as part of my medical decision-making.   EKG Interpretation   Date/Time:  Thursday September 17 2015 18:36:22 EDT Ventricular Rate:  55 PR Interval:  192 QRS Duration: 88 QT Interval:  452 QTC Calculation: 432 R Axis:   63 Text Interpretation:  Sinus bradycardia Nonspecific ST abnormality  Abnormal ECG no significant change since September 13 2015 Confirmed by  Regenia Skeeter  MD, Arkeem [id number]) on 09/17/2015 8:14:36 PM      MDM   Final diagnoses:  Dizziness    Patient feels well. Mild lightheadedness that has not worsened or improved. MRI shows typical evolution of his stroke  from several days ago but no new infarct. Per prior plan with neurology will discharge home. Recommend close follow-up with PCP as scheduled. Discussed return precautions.    Sherwood Gambler, MD 09/17/15 [id number]  Sherwood Gambler, MD 09/17/15 [id number]

## 2015-10-13 ENCOUNTER — Other Ambulatory Visit: Payer: Medicare Other

## 2015-10-16 ENCOUNTER — Ambulatory Visit (INDEPENDENT_AMBULATORY_CARE_PROVIDER_SITE_OTHER): Payer: Medicare Other | Admitting: Endocrinology

## 2015-10-16 ENCOUNTER — Encounter: Payer: Self-pay | Admitting: Endocrinology

## 2015-10-16 VITALS — BP 112/78 | HR 52 | Temp 98.1°F | Wt 185.0 lb

## 2015-10-16 DIAGNOSIS — D352 Benign neoplasm of pituitary gland: Secondary | ICD-10-CM | POA: Diagnosis not present

## 2015-10-16 LAB — TESTOSTERONE: TESTOSTERONE: 680.09 ng/dL (ref 300.00–890.00)

## 2015-10-16 NOTE — Progress Notes (Signed)
Patient ID: Jay Day, male   DOB: June 09, 1941, 75 y.o.   MRN: [id number]          Chief complaint:  Pituitary tumor  History of Present Illness     Prior history: Apparently in 2003 he was being evaluated by his PCP for complaints of decreased libido. 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   He was treated by his internist with bromocriptine.  Follow-up MRI in 04/2012 did not show any change in size of the tumor   RECENT HISTORY: Patient's previous records were reviewed in detail including paper records from previous evaluation, paper records from his PCP and also MRI reports in the Epic system.  Patient has been treated with low doses of bromocriptine which he takes at night  Because of his relatively low reluctant to level with 2.5 mg of bromocriptine he was tried on half tablet in 2012 and prolactin was normal at 5.2 subsequently  In 5/13 his prolactin level was mildly increased at 21.4 and he was increased back to the full tablet  He had been taking 2.5 mg in the evening for some time but his prolactin level in 11/2011 was only 2.0, in 7/14 was 1.9 and in 01/2014 his prolactin level was low at 1.6  No evidence of hypopituitarism on his evaluation in 10/2014 MRI brain scan in 9/16 did not show any obvious pituitary tumor although this was not focused on the pituitary  On his last visit he was not taking bromocriptine and his prolactin went up to 31 He had stopped it on his own for about a month He is back on a half tablet 3 times a week  He does not complain of any decreased libido, fatigue     Lab Results  Component Value Date   PROLACTIN 30.9* 04/13/2015   PROLACTIN 3.0 12/05/2014   PROLACTIN 6.9 10/13/2014        Medication List       This list is accurate  as of: 10/16/15  1:50 PM.  Always use your most recent med list.               bromocriptine 2.5 MG tablet  Commonly known as:  PARLODEL  Take 1.25 mg by mouth 3 (three) times a week. Monday, Wednesday,and Fridays     captopril 25 MG tablet  Commonly known as:  CAPOTEN  Take 25 mg by mouth daily. Reported on 10/16/2015     COPPER CAPS PO  Take 2 capsules by mouth daily. Reported on 10/16/2015     latanoprost 0.005 % ophthalmic solution  Commonly known as:  XALATAN  place 1 drop into both eyes at bedtime PLS. SCHEDULE AN APPOINTMENT BEFORE FUTURE REFILLS     Melatonin 3 MG Tabs  Take 3 mg by mouth at bedtime. Reported on 10/16/2015     metoprolol succinate 25 MG 24 hr tablet  Commonly known as:  TOPROL-XL  Take 1 tablet (25 mg total) by mouth daily.     Probiotic Caps  Take 1 capsule by mouth daily. Reported on 10/16/2015     VITAMIN B-12 PO  Take 1 tablet by mouth daily. Reported on 10/16/2015     warfarin 7.5 MG tablet  Commonly known as:  COUMADIN  Take 1  tablet (7.5 mg total) by mouth daily at 6 PM.        Allergies:  Allergies  Allergen Reactions  . Gluten Meal     Gluten Sensitivity     Past Medical History  Diagnosis Date  . Pulmonary embolism ([place])   . Clotting disorder ([place])   . Asthma     hx of as child  . Bronchitis     as a child  . Pituitary tumor [facility])     takes medication to manage  . Hypertension     sees [name], primary   . Elevated PSA     being monitored by physician  . Cataract   . 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 Glaucoma 10/13/2011  . 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 Chronic anticoagulation 06/11/2013  . MI (myocardial infarction) ([place])   . Peyronie's disease   . Prostatitis   . Headache disorder 02/24/2015    Past  Surgical History  Procedure Laterality Date  . Knee surgery  1962    left   . Hernia repair  1960  . Tonsillectomy      as a child  . Eye surgery      bilateral cataract surgery  . Inguinal hernia repair  08/12/2011    Procedure: LAPAROSCOPIC INGUINAL HERNIA;  Surgeon: [name], MD;  Location: [facility];  Service: General;  Laterality: Left;  . Left heart catheterization with coronary angiogram N/A 05/06/2014    Procedure: LEFT HEART CATHETERIZATION WITH CORONARY ANGIOGRAM;  Surgeon: [name], MD;  Location: [facility] CATH LAB;  Service: Cardiovascular;  Laterality: N/A;    Family History  Problem Relation Age of Onset  . Pulmonary embolism Mother   . Kidney failure Mother   . Hypertension Mother   . Kidney Stones Mother   . Pulmonary embolism Father   . Prostate cancer Father   . Pulmonary embolism Maternal Grandmother   . Pulmonary embolism Maternal Grandfather   . Pulmonary embolism Paternal Grandmother   . Pulmonary embolism Paternal Grandfather     Social History:  reports that he quit smoking about 45 years ago. His smoking use included Cigarettes. He has a 1.5 pack-year smoking history. He has never used smokeless tobacco. He reports that he does not drink alcohol or use illicit drugs.  ROS   He recently had a CVA resulting with dizziness  General Examination:   BP 112/78 mmHg  Pulse 52  Temp(Src) 98.1 F (36.7 C) (Oral)  Wt 185 lb (83.915 kg)  SpO2 98%    Assessment/ Plan:  PITUITARY adenoma: Although he had a 1.4 cm pituitary tumor his initial prolactin level was only moderately increased at 99 Has been very well controlled with very low doses of bromocriptine Previously has been unable to get off the medication as prolactin tends to get higher with stopping the medication  Currently on 1.25 mg 3 times a week's higher doses cause his prolactin to be low normal Labs are pending including testosterone  Also his pituitary tumor was not seen on the brain MRI  done recently   He is asking about mild increase in triglycerides in the hospital, he will follow-up with PCP for fasting labs  Janese Radabaugh 10/16/2015, 1:50 PM

## 2015-10-17 LAB — PROLACTIN: PROLACTIN: 18.2 ng/mL — AB (ref 4.0–15.2)

## 2015-10-21 ENCOUNTER — Other Ambulatory Visit: Payer: Self-pay | Admitting: *Deleted

## 2015-10-21 MED ORDER — BROMOCRIPTINE MESYLATE 2.5 MG PO TABS: ORAL_TABLET | ORAL | Status: DC

## 2015-10-28 ENCOUNTER — Telehealth: Payer: Self-pay | Admitting: *Deleted

## 2015-10-28 NOTE — Telephone Encounter (Signed)
I called patient. The patient had a small punctate stroke in the left frontal area 6 weeks ago, he was off of Coumadin at the time of the stroke. I have no problem with him flying at this point.

## 2015-10-28 NOTE — Telephone Encounter (Signed)
Taken 17-MAY-17 at  1:25PM by DEF ------------------------------------------------------------ Seymour Bars              CID [id number]  Patient SAME                 Pt's Dr Leonie Man        Area Code 336 Phone# [phone] DOB [address] [place] [address]    HAD [place]                     Disp:Y/N N If Y = C/B If No Response In 1minutes ============================================================

## 2015-10-28 NOTE — Telephone Encounter (Signed)
Rn call patient to give him the clearance information to patient. Rn explain that Dr.Sethi saw him in the hospital for a stroke. Dr.Sethi has never seen patient at [facility] for outpatient. Pt was just seen by Dr. Jannifer Franklin in January 2017 for a headache. Pt is still taking coumadin and has a hospital follow up with Dr.Sethi on June 5. Rn stated per Dr. Jannifer Franklin note below he can fly to [place]. Pt verbalized understanding. Rn explain Dr. Leonie Man was out of the country on conference and will not return until Nov 02, 2015.

## 2015-11-16 ENCOUNTER — Encounter: Payer: Self-pay | Admitting: Neurology

## 2015-11-16 ENCOUNTER — Ambulatory Visit (INDEPENDENT_AMBULATORY_CARE_PROVIDER_SITE_OTHER): Payer: Medicare Other | Admitting: Neurology

## 2015-11-16 VITALS — BP 98/73 | HR 58 | Ht 73.0 in | Wt 186.4 lb

## 2015-11-16 DIAGNOSIS — I6381 Other cerebral infarction due to occlusion or stenosis of small artery: Secondary | ICD-10-CM | POA: Insufficient documentation

## 2015-11-16 DIAGNOSIS — I639 Cerebral infarction, unspecified: Secondary | ICD-10-CM

## 2015-11-16 NOTE — Progress Notes (Signed)
[facility] [address] [place]. [place] [address] [phone]       OFFICE FOLLOW-UP NOTE  Jay. Jay Day Date of Birth:  [birth year] Medical Record Number:  [id number]   HPI: Jay Day is a 50 year Caucasian male seen today for first office visit following hospital admission for stroke in April 2017. Jay Day is a 75 y.o. male he was in his normal state of health until around 82 AM on 09/12/2015 ([facility]). He states that time he started noticing some mild difficulty with his walking. He states that he just felt unsteady when he was walking. Of note, he was on Coumadin for history of pulmonary embolism that have been 4 years ago. He was off his anticoagulants for several days because of a prostate biopsy. He had restarted Coumadin just a day or 2 before his symptoms started. Premorbid modified rankin scale: 0. Patient was not administered IV t-PA secondary to being outside the window. He was admitted for further evaluation and treatment. MRI scan of the brain showed a small left subcortical frontal infarct. MRA showed no large vessel stenosis. Carotid ultrasound was unremarkable. Transfer the echo showed normal ejection fraction. LDL cholesterol was borderline at 80 mg percent and hemoglobin A1c was 5.5. Patient was already on warfarin for history of embolism which was continued. He was advised to take statins but the patient refused. He is very conscious about his health and wants to use other medications. He has not been taking fish oil and red yeast Rice. He states his throat is improvement in his strength though he still has some diminished fine motor skills in the right hand. His balance is also occasionally often has to be careful and not rush. He is at no falls or injuries. He states he has seen Dr. Jannifer Franklin in the past in our office for headaches. He has a small pituitary tumor for which he follows up with Dr. [name].  ROS:   14 system review of systems is positive for  runny nose, shortness of breath, leg swelling, difficulty urinating, joint pain, easy bruising and all the systems negative PMH:  Past Medical History  Diagnosis Date  . Pulmonary embolism ([place])   . Clotting disorder ([place])   . Asthma     hx of as child  . Bronchitis     as a child  . Pituitary tumor [facility])     takes medication to manage  . Hypertension     sees [name], primary   . Elevated PSA     being monitored by physician  . Cataract   . 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 Glaucoma 10/13/2011  . 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 Chronic anticoagulation 06/11/2013  . MI (myocardial infarction) ([place])   . Peyronie's disease   . Prostatitis   . 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 Stroke [facility])     Social History:  Social History   Social History  . Marital Status: Married    Spouse Name: N/A  . Number of Children: 2  . Years of Education: BA   Occupational History  . [employer]    Social History Main Topics  . Smoking status: Former Smoker -- 0.30 packs/day for 5 years    Types: Cigarettes  Quit date: 06/13/1970  . Smokeless tobacco: Never Used  . Alcohol Use: No     Comment: very little  . Drug Use: No  . Sexual Activity: Not on file   Other Topics Concern  . Not on file   Social History Narrative   Patient drinks about 2 cups of caffeine daily.   Patient is left handed.    Medications:   Current Outpatient Prescriptions on File Prior to Visit  Medication Sig Dispense Refill  . bromocriptine (PARLODEL) 2.5 MG tablet Take 1/2 tablet daily 15 tablet 3  . captopril (CAPOTEN) 25 MG tablet Take 25 mg by mouth daily. Reported on 10/16/2015    . latanoprost (XALATAN) 0.005 % ophthalmic solution place 1 drop into both eyes at bedtime PLS.  SCHEDULE AN APPOINTMENT BEFORE FUTURE REFILLS  0  . Melatonin 3 MG TABS Take 3 mg by mouth at bedtime. Reported on 10/16/2015    . metoprolol succinate (TOPROL-XL) 25 MG 24 hr tablet Take 1 tablet (25 mg total) by mouth daily. 30 tablet 0  . warfarin (COUMADIN) 7.5 MG tablet Take 1 tablet (7.5 mg total) by mouth daily at 6 PM. 7 tablet 0   No current facility-administered medications on file prior to visit.    Allergies:   Allergies  Allergen Reactions  . Gluten Meal     Gluten Sensitivity     Physical Exam General: well developed, well nourished elderly Caucasian male, seated, in no evident distress Head: head normocephalic and atraumatic.  Neck: supple with no carotid or supraclavicular bruits Cardiovascular: regular rate and rhythm, no murmurs Musculoskeletal: no deformity Skin:  no rash/petichiae Vascular:  Normal pulses all extremities Filed Vitals:   11/16/15 1507  BP: 98/73  Pulse: 58   Neurologic Exam Mental Status: Awake and fully alert. Oriented to place and time. Recent and remote memory intact. Attention span, concentration and fund of knowledge appropriate. Mood and affect appropriate.  Cranial Nerves: Fundoscopic exam reveals sharp disc margins. Pupils equal, briskly reactive to light. Extraocular movements full without nystagmus. Visual fields full to confrontation. Hearing intact. Facial sensation intact. Face, tongue, palate moves normally and symmetrically.  Motor: Normal bulk and tone. Normal strength in all tested extremity muscles.Diminished fine finger movements on the right. Orbits left over right upper extremity. Sensory.: intact to touch ,pinprick .position and vibratory sensation.  Coordination: Rapid alternating movements normal in all extremities. Finger-to-nose and heel-to-shin performed accurately bilaterally. Gait and Station: Arises from chair without difficulty. Stance is normal. Gait demonstrates normal stride length and balance . Able to heel, toe  and tandem walk with slight difficulty.  Reflexes: 1+ and symmetric. Toes downgoing.   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 Patient has incidental pituitary tumor. 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 Mild hyperlipidemia and patient refusing statins    PLAN: I had a long d/w patient about his recent stroke, risk for recurrent stroke/TIAs, personally independently reviewed imaging studies and stroke evaluation results and answered questions.Continue warfarin daily  for secondary stroke prevention  Due to h/o pulmonary embolism and maintain strict control of hypertension with blood pressure goal below 130/90, diabetes with hemoglobin A1c goal below 6.5% and lipids with LDL cholesterol goal below 70 mg/dL.Patient is reluctant to take statins hence I recommend he take fish oil 1200 mg daily and red yeast rice I also advised the patient to eat a  healthy diet with plenty of whole grains, cereals, fruits and vegetables, exercise regularly and maintain ideal body weight Followup in the future with Dr. Jannifer Franklin whom he has seen previously has needed. No routine follow-up appointment is necessary with me.Greater than 50% of time during this 25 minute visit was spent on counseling,explanation of diagnosis, planning of further management, discussion with patient and family and coordination of care Antony Contras, MD  [facility] [address] [place], [place] [address]  Phone [phone] Fax [phone] Note: This document was prepared with digital dictation and possible smart phrase technology. Any transcriptional errors that result from this process are unintentional

## 2015-11-16 NOTE — Patient Instructions (Signed)
I had a long d/w patient about his recent stroke, risk for recurrent stroke/TIAs, personally independently reviewed imaging studies and stroke evaluation results and answered questions.Continue warfarin daily  for secondary stroke prevention  Due to h/o pulmonary embolism and maintain strict control of hypertension with blood pressure goal below 130/90, diabetes with hemoglobin A1c goal below 6.5% and lipids with LDL cholesterol goal below 70 mg/dL.Patient is reluctant to take statins hence I recommend he take fish oil 1200 mg daily and red yeast rice I also advised the patient to eat a healthy diet with plenty of whole grains, cereals, fruits and vegetables, exercise regularly and maintain ideal body weight Followup in the future with Dr. Jannifer Franklin whom he has seen previously has needed. No routine follow-up appointment is necessary with me. Stroke Prevention Some medical conditions and behaviors are associated with an increased chance of having a stroke. You may prevent a stroke by making healthy choices and managing medical conditions. HOW CAN I REDUCE MY RISK OF HAVING A STROKE?   Stay physically active. Get at least 30 minutes of activity on most or all days.  Do not smoke. It may also be helpful to avoid exposure to secondhand smoke.  Limit alcohol use. Moderate alcohol use is considered to be:  No more than 2 drinks per day for men.  No more than 1 drink per day for nonpregnant women.  Eat healthy foods. This involves:  Eating 5 or more servings of fruits and vegetables a day.  Making dietary changes that address high blood pressure (hypertension), high cholesterol, diabetes, or obesity.  Manage your cholesterol levels.  Making food choices that are high in fiber and low in saturated fat, trans fat, and cholesterol may control cholesterol levels.  Take any prescribed medicines to control cholesterol as directed by your health care provider.  Manage your diabetes.  Controlling your  carbohydrate and sugar intake is recommended to manage diabetes.  Take any prescribed medicines to control diabetes as directed by your health care provider.  Control your hypertension.  Making food choices that are low in salt (sodium), saturated fat, trans fat, and cholesterol is recommended to manage hypertension.  Ask your health care provider if you need treatment to lower your blood pressure. Take any prescribed medicines to control hypertension as directed by your health care provider.  If you are 94-10 years of age, have your blood pressure checked every 3-5 years. If you are 20 years of age or older, have your blood pressure checked every year.  Maintain a healthy weight.  Reducing calorie intake and making food choices that are low in sodium, saturated fat, trans fat, and cholesterol are recommended to manage weight.  Stop drug abuse.  Avoid taking birth control pills.  Talk to your health care provider about the risks of taking birth control pills if you are over 21 years old, smoke, get migraines, or have ever had a blood clot.  Get evaluated for sleep disorders (sleep apnea).  Talk to your health care provider about getting a sleep evaluation if you snore a lot or have excessive sleepiness.  Take medicines only as directed by your health care provider.  For some people, aspirin or blood thinners (anticoagulants) are helpful in reducing the risk of forming abnormal blood clots that can lead to stroke. If you have the irregular heart rhythm of atrial fibrillation, you should be on a blood thinner unless there is a good reason you cannot take them.  Understand all your medicine instructions.  Make sure that other conditions (such as anemia or atherosclerosis) are addressed. SEEK IMMEDIATE MEDICAL CARE IF:   You have sudden weakness or numbness of the face, arm, or leg, especially on one side of the body.  Your face or eyelid droops to one side.  You have sudden  confusion.  You have trouble speaking (aphasia) or understanding.  You have sudden trouble seeing in one or both eyes.  You have sudden trouble walking.  You have dizziness.  You have a loss of balance or coordination.  You have a sudden, severe headache with no known cause.  You have new chest pain or an irregular heartbeat. Any of these symptoms may represent a serious problem that is an emergency. Do not wait to see if the symptoms will go away. Get medical help at once. Call your local emergency services (911 in [place] Do not drive yourself to the hospital.   This information is not intended to replace advice given to you by your health care provider. Make sure you discuss any questions you have with your health care provider.   Document Released: 07/07/2004 Document Revised: 06/20/2014 Document Reviewed: 11/30/2012 Elsevier Interactive Patient Education [employer].

## 2016-04-18 ENCOUNTER — Other Ambulatory Visit: Payer: Medicare Other

## 2016-04-18 DIAGNOSIS — D352 Benign neoplasm of pituitary gland: Secondary | ICD-10-CM

## 2016-04-19 LAB — PROLACTIN: Prolactin: 3.5 ng/mL — ABNORMAL LOW (ref 4.0–15.2)

## 2016-04-21 ENCOUNTER — Encounter: Payer: Self-pay | Admitting: Endocrinology

## 2016-04-21 ENCOUNTER — Ambulatory Visit (INDEPENDENT_AMBULATORY_CARE_PROVIDER_SITE_OTHER): Payer: Medicare Other | Admitting: Endocrinology

## 2016-04-21 VITALS — BP 98/59 | HR 85 | Wt 189.0 lb

## 2016-04-21 DIAGNOSIS — D352 Benign neoplasm of pituitary gland: Secondary | ICD-10-CM | POA: Diagnosis not present

## 2016-04-21 NOTE — Progress Notes (Signed)
Patient ID: Jay Day, male   DOB: 02/25/1941, 75 y.o.   MRN: [id number]          Chief complaint:  Pituitary tumor  History of Present Illness     Prior history: Apparently in 2003 he was being evaluated by his PCP for complaints of decreased libido. 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   He was treated by his internist with bromocriptine.  Follow-up MRI in 04/2012 did not show any change in size of the tumor   RECENT HISTORY: Patient's previous records were reviewed in detail including paper records from previous evaluation, paper records from his PCP and also MRI reports in the Epic system.  Patient has been treated with low doses of bromocriptine which he takes at night  Because of his relatively low reluctant to level with 2.5 mg of bromocriptine he was tried on half tablet in 2012 and prolactin was normal at 5.2 subsequently  In 5/13 his prolactin level was mildly increased at 21.4 and he was increased back to the full tablet  He had been taking 2.5 mg in the evening for some time but his prolactin level in 11/2011 was only 2.0, in 7/14 was 1.9 and in 01/2014 his prolactin level was low at 1.6  No evidence of hypopituitarism on his evaluation in 10/2014 MRI brain scan in 9/16 did not show any obvious pituitary tumor although this was not focused on the pituitary  When he was not taking bromocriptine  his prolactin went up to 31 He was on a half tablet 3 times a week subsequently but because his prolactin was still mildly increased at 18 he was told to take half tablet daily  Testosterone level was also normal when he had a prolactin of 18  He does not complain of any decreased libido, fatigue   Prolactin now is back to below normal levels   Lab Results  Component  Value Date   PROLACTIN 3.5 (L) 04/18/2016   PROLACTIN 18.2 (H) 10/16/2015   PROLACTIN 30.9 (H) 04/13/2015   PROLACTIN 3.0 12/05/2014   PROLACTIN 6.9 10/13/2014        Medication List       Accurate as of 04/21/16 11:59 PM. Always use your most recent med list.          bromocriptine 2.5 MG tablet Commonly known as:  PARLODEL Take 1/2 tablet daily   captopril 25 MG tablet Commonly known as:  CAPOTEN Take 25 mg by mouth daily. Reported on 10/16/2015   latanoprost 0.005 % ophthalmic solution Commonly known as:  XALATAN place 1 drop into both eyes at bedtime PLS. SCHEDULE AN APPOINTMENT BEFORE FUTURE REFILLS   Melatonin 3 MG Tabs Take 3 mg by mouth at bedtime. Reported on 10/16/2015   metoprolol succinate 25 MG 24 hr tablet Commonly known as:  TOPROL-XL Take 1 tablet (25 mg total) by mouth daily.   warfarin 7.5 MG tablet Commonly known as:  COUMADIN Take 1 tablet (7.5 mg total) by mouth daily at 6 PM.       Allergies:  Allergies  Allergen Reactions  . Gluten Meal     Gluten Sensitivity     Past Medical History:  Diagnosis Date  . Asthma    hx of as child  .  Bronchitis    as a child  . Cataract   . Chronic anticoagulation 06/11/2013  . Clotting disorder ([place])   . 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 Elevated PSA    being monitored by physician  . Glaucoma 10/13/2011  . 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 Hypertension    sees [name], primary   . MI (myocardial infarction)   . Peyronie's disease   . 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 Pituitary tumor    takes medication to manage  . Prostatitis   . Pulmonary embolism ([place])   . Stroke [facility])     Past Surgical History:  Procedure Laterality Date  . EYE SURGERY     bilateral cataract surgery    . HERNIA REPAIR  1960  . INGUINAL HERNIA REPAIR  08/12/2011   Procedure: LAPAROSCOPIC INGUINAL HERNIA;  Surgeon: [name], MD;  Location: [facility];  Service: General;  Laterality: Left;  . [place]   left   . LEFT HEART CATHETERIZATION WITH CORONARY ANGIOGRAM N/A 05/06/2014   Procedure: LEFT HEART CATHETERIZATION WITH CORONARY ANGIOGRAM;  Surgeon: [name], MD;  Location: [facility] CATH LAB;  Service: Cardiovascular;  Laterality: N/A;  . TONSILLECTOMY     as a child    Family History  Problem Relation Age of Onset  . Pulmonary embolism Mother   . Kidney failure Mother   . Hypertension Mother   . Kidney Stones Mother   . Pulmonary embolism Father   . Prostate cancer Father   . Pulmonary embolism Maternal Grandmother   . Pulmonary embolism Maternal Grandfather   . Pulmonary embolism Paternal Grandmother   . Pulmonary embolism Paternal Grandfather     Social History:  reports that he quit smoking about 45 years ago. His smoking use included Cigarettes. He has a 1.50 pack-year smoking history. He has never used smokeless tobacco. He reports that he does not drink alcohol or use drugs.  ROS   He had a CVA resulting with dizziness Which resolved  Blood pressure is low normal without symptoms of lightheadedness  No symptoms of headaches  General Examination:   BP (!) 98/59   Pulse 85   Wt 189 lb (85.7 kg)   SpO2 97%   BMI 24.94 kg/m   Repeat blood pressure was 108/64 standing  Assessment/ Plan:  PITUITARY adenoma: Although he had a 1.4 cm pituitary tumor his initial prolactin level was only moderately increased at 99 Has been very well controlled with very low doses of bromocriptine Previously has been unable to get off the medication as prolactin tends to get higher with stopping the medication  Currently on 1.25 mg daily and his prolactin is now again below normal However his testosterone level tends to be quite normal even with a slight increase in  prolactin Since he has not had any signs of pituitary gland enlargement on his previous MRI in his pituitary function and normally can go back to the bromocriptine 3 times a week Follow-up in 6 months  Trayshawn Durkin 04/22/2016, 8:27 AM

## 2016-04-21 NOTE — Patient Instructions (Signed)
Take 1/2 pill 3x per week

## 2016-05-13 ENCOUNTER — Other Ambulatory Visit: Payer: Self-pay | Admitting: Gastroenterology

## 2016-05-18 ENCOUNTER — Telehealth: Payer: Self-pay

## 2016-05-18 ENCOUNTER — Ambulatory Visit: Payer: Medicare Other | Admitting: Neurology

## 2016-05-18 NOTE — Telephone Encounter (Signed)
Pt no-showed his follow-up appt this morning.

## 2016-05-19 ENCOUNTER — Encounter: Payer: Self-pay | Admitting: Neurology

## 2016-06-18 ENCOUNTER — Encounter (HOSPITAL_COMMUNITY): Payer: Self-pay

## 2016-06-18 ENCOUNTER — Emergency Department (HOSPITAL_COMMUNITY): Payer: PPO

## 2016-06-18 ENCOUNTER — Emergency Department (HOSPITAL_COMMUNITY)
Admission: EM | Admit: 2016-06-18 | Discharge: 2016-06-19 | Disposition: A | Discharge status: Home / Self Care | Payer: PPO | Attending: Emergency Medicine | Admitting: Emergency Medicine

## 2016-06-18 DIAGNOSIS — I639 Cerebral infarction, unspecified: Secondary | ICD-10-CM | POA: Diagnosis not present

## 2016-06-18 DIAGNOSIS — R2681 Unsteadiness on feet: Secondary | ICD-10-CM | POA: Diagnosis not present

## 2016-06-18 DIAGNOSIS — I252 Old myocardial infarction: Secondary | ICD-10-CM | POA: Insufficient documentation

## 2016-06-18 DIAGNOSIS — Z8639 Personal history of other endocrine, nutritional and metabolic disease: Secondary | ICD-10-CM | POA: Diagnosis not present

## 2016-06-18 DIAGNOSIS — R269 Unspecified abnormalities of gait and mobility: Secondary | ICD-10-CM | POA: Diagnosis not present

## 2016-06-18 DIAGNOSIS — Z87891 Personal history of nicotine dependence: Secondary | ICD-10-CM | POA: Diagnosis not present

## 2016-06-18 DIAGNOSIS — Z79899 Other long term (current) drug therapy: Secondary | ICD-10-CM | POA: Diagnosis not present

## 2016-06-18 DIAGNOSIS — J45909 Unspecified asthma, uncomplicated: Secondary | ICD-10-CM | POA: Diagnosis not present

## 2016-06-18 DIAGNOSIS — Z8673 Personal history of transient ischemic attack (TIA), and cerebral infarction without residual deficits: Secondary | ICD-10-CM | POA: Insufficient documentation

## 2016-06-18 DIAGNOSIS — Z7901 Long term (current) use of anticoagulants: Secondary | ICD-10-CM | POA: Diagnosis not present

## 2016-06-18 DIAGNOSIS — R42 Dizziness and giddiness: Secondary | ICD-10-CM | POA: Insufficient documentation

## 2016-06-18 DIAGNOSIS — I10 Essential (primary) hypertension: Secondary | ICD-10-CM | POA: Insufficient documentation

## 2016-06-18 LAB — COMPREHENSIVE METABOLIC PANEL
ALBUMIN: 4 g/dL (ref 3.5–5.0)
ALT: 24 U/L (ref 17–63)
AST: 34 U/L (ref 15–41)
Alkaline Phosphatase: 66 U/L (ref 38–126)
Anion gap: 9 (ref 5–15)
BILIRUBIN TOTAL: 0.5 mg/dL (ref 0.3–1.2)
BUN: 16 mg/dL (ref 6–20)
CHLORIDE: 108 mmol/L (ref 101–111)
CO2: 22 mmol/L (ref 22–32)
Calcium: 9.1 mg/dL (ref 8.9–10.3)
Creatinine, Ser: 0.91 mg/dL (ref 0.61–1.24)
GFR calc Af Amer: >60 mL/min (ref >60)
GFR calc non Af Amer: >60 mL/min (ref >60)
GLUCOSE: 104 mg/dL — AB (ref 65–99)
POTASSIUM: 3.9 mmol/L (ref 3.5–5.1)
SODIUM: 139 mmol/L (ref 135–145)
Total Protein: 6.4 g/dL — ABNORMAL LOW (ref 6.5–8.1)

## 2016-06-18 LAB — DIFFERENTIAL
BASOS ABS: 0 10*3/uL (ref 0.0–0.1)
Basophils Relative: 1 %
EOS ABS: 0.3 10*3/uL (ref 0.0–0.7)
Eosinophils Relative: 6 %
LYMPHS ABS: 1.7 10*3/uL (ref 0.7–4.0)
Lymphocytes Relative: 34 %
Monocytes Absolute: 0.4 10*3/uL (ref 0.1–1.0)
Monocytes Relative: 7 %
NEUTROS PCT: 52 %
Neutro Abs: 2.7 10*3/uL (ref 1.7–7.7)

## 2016-06-18 LAB — I-STAT CHEM 8, ED
BUN: 20 mg/dL (ref 6–20)
CHLORIDE: 108 mmol/L (ref 101–111)
CREATININE: 0.9 mg/dL (ref 0.61–1.24)
Calcium, Ion: 1.18 mmol/L (ref 1.15–1.40)
Glucose, Bld: 109 mg/dL — ABNORMAL HIGH (ref 65–99)
HEMATOCRIT: 43 % (ref 39.0–52.0)
Hemoglobin: 14.6 g/dL (ref 13.0–17.0)
POTASSIUM: 3.9 mmol/L (ref 3.5–5.1)
Sodium: 144 mmol/L (ref 135–145)
TCO2: 26 mmol/L (ref 0–100)

## 2016-06-18 LAB — I-STAT TROPONIN, ED: Troponin i, poc: 0 ng/mL (ref 0.00–0.08)

## 2016-06-18 LAB — CBC
HCT: 41.7 % (ref 39.0–52.0)
HEMOGLOBIN: 15.2 g/dL (ref 13.0–17.0)
MCH: 35.3 pg — ABNORMAL HIGH (ref 26.0–34.0)
MCHC: 36.5 g/dL — AB (ref 30.0–36.0)
MCV: 96.8 fL (ref 78.0–100.0)
Platelets: 124 10*3/uL — ABNORMAL LOW (ref 150–400)
RBC: 4.31 MIL/uL (ref 4.22–5.81)
RDW: 12.1 % (ref 11.5–15.5)
WBC: 5.1 10*3/uL (ref 4.0–10.5)

## 2016-06-18 LAB — APTT: APTT: 35 s (ref 24–36)

## 2016-06-18 LAB — PROTIME-INR
INR: 2
Prothrombin Time: 23 seconds — ABNORMAL HIGH (ref 11.4–15.2)

## 2016-06-18 MED ORDER — MECLIZINE HCL 12.5 MG PO TABS: 12.5000 mg | ORAL_TABLET | Freq: Three times a day (TID) | ORAL | 0 refills | Status: DC | PRN

## 2016-06-18 MED ORDER — MECLIZINE HCL 25 MG PO TABS
12.5000 mg | ORAL_TABLET | Freq: Once | ORAL | Status: AC
  Administered 2016-06-18: 12.5 mg via ORAL
  Filled 2016-06-18: qty 1

## 2016-06-18 NOTE — ED Triage Notes (Signed)
Per Pt, Pt is coming from home with complaints of gait change since yesterday. Pt reports having hx of the same with previous stroke. Pt denies any weakness or numbness, any change in vision, confusion, or trouble speaking. Denies any other stroke symptoms.

## 2016-06-18 NOTE — ED Provider Notes (Signed)
[facility] DEPT Provider Note   CSN: [id number] Arrival date & time: 06/18/16  1805     History   Chief Complaint Chief Complaint  Patient presents with  . Gait Problem    HPI LESSIE FAUST is a 76 y.o. male.  The history is provided by the patient, medical records and the spouse. No language interpreter was used.  76 year old male presents today with complaint of unsteady balance starting this morning. Patient states he woke up with this and tried to wait out improvement throughout the day. States that he's had no improvement and he is concerned because he had a similar episode earlier this year which turned out to be a stroke. Patient was admitted in April 2017, review the discharge summary shows that the patient had a left posterior frontal subcortical white matter infarcts. During that time he had carotid duplex that did not show any significant stenosis. An echo did not show any embolus present. Patient is on anticoagulation for prior history of pulmonary embolism. He is currently on Coumadin. His INR today is 2. Patient denies any fevers, chills, headache, shortness of breath, numbness, focal weakness, chest pain. No worsening or alleviating factors. Denies any recent head injuries.  Past Medical History:  Diagnosis Date  . Asthma    hx of as child  . Bronchitis    as a child  . Cataract   . Chronic anticoagulation 06/11/2013  . Clotting disorder ([place])   . 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 Elevated PSA    being monitored by physician  . Glaucoma 10/13/2011  . 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 Hypertension    sees [name], primary   . MI (myocardial infarction)   . Peyronie's disease   . Pituitary adenoma ([place]) 10/13/2011  . Pituitary tumor    takes  medication to manage  . Prostatitis   . Pulmonary embolism ([place])   . Stroke [facility])     Patient Active Problem List   Diagnosis Date Noted  . Lacunar infarct, acute ([place]) 11/16/2015  . Elevated PSA 09/14/2015  . Cerebral infarction due to unspecified mechanism   . CVA (cerebral infarction) 09/13/2015  . 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 Abnormal stress test 05/06/2014  . Aortic atherosclerosis ([place]) 03/17/2014  . Coronary artery calcification 03/17/2014  . History of pulmonary embolism 03/17/2014  . Chronic anticoagulation 06/11/2013  . Pituitary adenoma ([place]) 10/13/2011  . Glaucoma 10/13/2011  . 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 HTN (hypertension) 09/11/2011  . Pulmonary embolism ([place]) 09/16/2010    Past Surgical History:  Procedure Laterality Date  . EYE SURGERY     bilateral cataract surgery  . HERNIA REPAIR  1960  . INGUINAL HERNIA REPAIR  08/12/2011   Procedure: LAPAROSCOPIC INGUINAL HERNIA;  Surgeon: [name], MD;  Location: [facility];  Service: General;  Laterality: Left;  . [place]   left   . LEFT HEART CATHETERIZATION WITH CORONARY ANGIOGRAM N/A 05/06/2014   Procedure: LEFT HEART CATHETERIZATION WITH CORONARY ANGIOGRAM;  Surgeon: [name], MD;  Location: [facility] CATH LAB;  Service: Cardiovascular;  Laterality: N/A;  . TONSILLECTOMY     as a child       Home Medications  Prior to Admission medications   Medication Sig Start Date End Date Taking? Authorizing Provider  B Complex-C (B-COMPLEX WITH VITAMIN C) tablet Take 1 tablet by mouth daily.   Yes Historical Provider, MD  bromocriptine (PARLODEL) 2.5 MG tablet Take 1/2 tablet daily Patient taking differently: Take 1.25 mg by mouth 3 (three) times a week. Monday, Wednesday and Friday. 10/21/15  Yes Elayne Snare, MD  captopril (CAPOTEN) 25 MG tablet Take 25 mg by mouth daily. Reported on 10/16/2015   Yes Historical  Provider, MD  cholecalciferol (VITAMIN D) 1000 units tablet Take 3,000 Units by mouth daily.   Yes Historical Provider, MD  diphenhydrAMINE (BENADRYL) 25 MG tablet Take 25 mg by mouth at bedtime as needed for itching.   Yes Historical Provider, MD  latanoprost (XALATAN) 0.005 % ophthalmic solution place 1 drop into both eyes at bedtime PLS. SCHEDULE AN APPOINTMENT BEFORE FUTURE REFILLS 03/30/15  Yes Historical Provider, MD  Melatonin 3 MG TABS Take 3 mg by mouth at bedtime as needed (sleep). Reported on 10/16/2015   Yes Historical Provider, MD  metoprolol succinate (TOPROL-XL) 25 MG 24 hr tablet Take 1 tablet (25 mg total) by mouth daily. Patient taking differently: Take 12.5 mg by mouth daily.  09/15/15  Yes Hosie Poisson, MD  omega-3 acid ethyl esters (LOVAZA) 1 g capsule Take 1 g by mouth daily.   Yes Historical Provider, MD  warfarin (COUMADIN) 7.5 MG tablet Take 1 tablet (7.5 mg total) by mouth daily at 6 PM. Patient taking differently: Take 5 mg by mouth daily at 6 PM.  09/15/15  Yes Hosie Poisson, MD  meclizine (ANTIVERT) 12.5 MG tablet Take 1 tablet (12.5 mg total) by mouth 3 (three) times daily as needed for dizziness. 06/18/16   Theodosia Quay, MD    Family History Family History  Problem Relation Age of Onset  . Pulmonary embolism Mother   . Kidney failure Mother   . Hypertension Mother   . Kidney Stones Mother   . Pulmonary embolism Father   . Prostate cancer Father   . Pulmonary embolism Maternal Grandmother   . Pulmonary embolism Maternal Grandfather   . Pulmonary embolism Paternal Grandmother   . Pulmonary embolism Paternal Grandfather     Social History Social History  Substance Use Topics  . Smoking status: Former Smoker    Packs/day: 0.30    Years: 5.00    Types: Cigarettes    Quit date: 06/13/1970  . Smokeless tobacco: Never Used  . Alcohol use No     Comment: very little     Allergies   Gluten meal and Wheat bran   Review of Systems Review of Systems    Constitutional: Negative for chills and fever.  HENT: Negative for ear pain and sore throat.   Respiratory: Negative for cough and shortness of breath.   Cardiovascular: Negative for chest pain and palpitations.  Gastrointestinal: Negative for abdominal pain and vomiting.  Genitourinary: Negative for dysuria and hematuria.  Musculoskeletal: Negative for arthralgias and back pain.  Skin: Negative for color change and rash.  Neurological: Positive for dizziness (as described in HPI). Negative for seizures, syncope, weakness and numbness.  All other systems reviewed and are negative.    Physical Exam Updated Vital Signs BP 136/82   Pulse (!) 55   Temp 98.4 F (36.9 C)   Resp 16   Ht 6\' 1"  (1.854 m)   Wt 81.6 kg   SpO2 97%   BMI 23.75 kg/m   Physical Exam  Constitutional: He is oriented to person, place, and time. He appears well-developed and well-nourished.  HENT:  Head: Normocephalic and atraumatic.  Right Ear: Tympanic membrane, external ear and ear canal normal.  Left Ear: Tympanic membrane, external ear and ear canal normal.  Eyes: Conjunctivae are normal.  Neck: Neck supple.  Cardiovascular: Normal rate and regular rhythm.   No murmur heard. Pulmonary/Chest: Effort normal and breath sounds normal. No respiratory distress.  Abdominal: Soft. There is no tenderness.  Musculoskeletal: Normal range of motion. He exhibits no edema.  Neurological: He is alert and oriented to person, place, and time. He has normal strength. No cranial nerve deficit (CN II-XII intact) or sensory deficit. He displays a negative Romberg sign. Coordination (nml finger to nose/heel-shin bilaterally) and gait normal. GCS eye subscore is 4. GCS verbal subscore is 5. GCS motor subscore is 6.  Skin: Skin is warm and dry.  Psychiatric: He has a normal mood and affect.  Nursing note and vitals reviewed.    ED Treatments / Results  Labs (all labs ordered are listed, but only abnormal results are  displayed) Labs Reviewed  PROTIME-INR - Abnormal; Notable for the following:       Result Value   Prothrombin Time 23.0 (*)    All other components within normal limits  CBC - Abnormal; Notable for the following:    MCH 35.3 (*)    MCHC 36.5 (*)    Platelets 124 (*)    All other components within normal limits  COMPREHENSIVE METABOLIC PANEL - Abnormal; Notable for the following:    Glucose, Bld 104 (*)    Total Protein 6.4 (*)    All other components within normal limits  I-STAT CHEM 8, ED - Abnormal; Notable for the following:    Glucose, Bld 109 (*)    All other components within normal limits  APTT  DIFFERENTIAL  I-STAT TROPOININ, ED    EKG  EKG Interpretation  Date/Time:  Saturday June 18 2016 18:43:35 EST Ventricular Rate:  60 PR Interval:  196 QRS Duration: 84 QT Interval:  430 QTC Calculation: 430 R Axis:   65 Text Interpretation:  Sinus rhythm with Premature atrial complexes Nonspecific ST abnormality No significant change since last tracing Confirmed by Ashok Cordia  MD, Lennette Bihari ([id number]) on 06/18/2016 9:48:27 PM       Radiology Ct Head Wo Contrast  Result Date: 06/18/2016 CLINICAL DATA:  Gait disturbance and dizziness EXAM: CT HEAD WITHOUT CONTRAST TECHNIQUE: Contiguous axial images were obtained from the base of the skull through the vertex without intravenous contrast. COMPARISON:  Head CT September 17, 2015; brain MRI September 17, 2015 FINDINGS: Brain: The ventricles are normal in size and configuration. There is no intracranial mass, hemorrhage, extra-axial fluid collection, or midline shift. There is a prior small lacunar infarct just lateral to the genu of the left internal capsule, stable. There is a stable prior small lacunar type infarct in the right posterior centrum semiovale. Elsewhere there is minimal periventricular small vessel disease in the centra semiovale bilaterally. No acute infarct evident. Vascular: No hyperdense vessels. There is calcification in each carotid  siphon and proximal middle cerebral artery. Skull: Bony calvarium appears intact. Sinuses/Orbits: There is extensive mucoperiosteal thickening in both maxillary antra, more severe on the left than on the right. There is opacification of multiple ethmoid air cells bilaterally. There is mucosal thickening in both inferior frontal sinuses. No air-fluid levels. Orbits appear symmetric bilaterally. Other: Mastoid air cells are clear. IMPRESSION: Small stable lacunar infarcts  in the left basal ganglia and right posterior centrum semiovale as well as minimal periventricular small vessel disease. No intracranial mass, hemorrhage, or acute appearing infarct. Foci of arterial vascular calcification noted. Areas of paranasal sinus disease as summarized above. Electronically Signed   By: Lowella Grip III M.D.   On: 06/18/2016 19:37   Mr Brain Wo Contrast  Result Date: 06/18/2016 CLINICAL DATA:  76 y/o  M; unsteady gait. EXAM: MRI HEAD WITHOUT CONTRAST TECHNIQUE: Multiplanar, multiecho pulse sequences of the brain and surrounding structures were obtained without intravenous contrast. COMPARISON:  06/18/2016 CT head. 09/17/2015 MRI brain. 11/01/2001 MRI of the brain FINDINGS: Brain: No diffusion signal abnormality. Stable chronic lacunar infarct within the left caudate body and mid corona radiata with minimal hemosiderin staining. Stable cyst/prominent perivascular space in the right posterior corona radiata. No new focal mass effect or new focus of susceptibility hypointensity to indicate interval intracranial hemorrhage. Mild brain parenchymal volume loss and chronic microvascular ischemic changes are stable. T2 hyperintense focus centered within the right aspect of the pituitary gland and cavernous sinus best appreciated on the coronal sequence is decreased in size from the 2003 pituitary MRI of the brain and probably represents residual adenoma. Vascular: Normal flow voids. Skull and upper cervical spine: Normal  marrow signal. Sinuses/Orbits: Moderate stable diffuse paranasal sinus mucosal thickening. No abnormal signal of mastoid air cells. Bilateral intra-ocular lens replacement. Other: None. IMPRESSION: 1. No acute intracranial abnormality. 2. Stable mild chronic microvascular ischemic changes, parenchymal volume loss, and chronic lacunar infarct within left mid corona radiata. 3. Lesion within right aspect of the pituitary and cavernous sinuses decreased in size from 2003 and compatible with adenoma as previously described. 4. Stable moderate diffuse paranasal sinus mucosal thickening. Electronically Signed   By: Kristine Garbe M.D.   On: 06/18/2016 23:13    Procedures Procedures (including critical care time)  Medications Ordered in ED Medications  meclizine (ANTIVERT) tablet 12.5 mg (not administered)     Initial Impression / Assessment and Plan / ED Course  I have reviewed the triage vital signs and the nursing notes.  Pertinent labs & imaging results that were available during my care of the patient were reviewed by me and considered in my medical decision making (see chart for details).  Clinical Course     76 year old male presents today with feeling of being unsteady. On my exam, patient has no acute neurological deficits. No acute findings on his head CT. Given his prior history of stroke I proceeded with getting a MRI of his brain. On review of this imaging, the patient has no acute intracranial findings. Findings seem less likely to be secondary to central etiology. Additionally, patient states that this seems to be positional. He has no effusions on exam of his tympanic membranes.  Discussed with neurology who also agrees that the MRI is negative, no indication for further neurological workup.   Discussed this with patient. He is understanding. We will try meclizine at home as needed. First dose given here. Patient discharged home in good condition.   Final Clinical  Impressions(s) / ED Diagnoses   Final diagnoses:  Dizziness    New Prescriptions New Prescriptions   MECLIZINE (ANTIVERT) 12.5 MG TABLET    Take 1 tablet (12.5 mg total) by mouth 3 (three) times daily as needed for dizziness.     Theodosia Quay, MD 06/18/16 [place], MD 06/19/16 [id number]

## 2016-07-13 DIAGNOSIS — Z7901 Long term (current) use of anticoagulants: Secondary | ICD-10-CM | POA: Diagnosis not present

## 2016-07-13 DIAGNOSIS — H8110 Benign paroxysmal vertigo, unspecified ear: Secondary | ICD-10-CM | POA: Diagnosis not present

## 2016-07-13 DIAGNOSIS — J329 Chronic sinusitis, unspecified: Secondary | ICD-10-CM | POA: Diagnosis not present

## 2016-07-13 DIAGNOSIS — H9201 Otalgia, right ear: Secondary | ICD-10-CM | POA: Diagnosis not present

## 2016-07-13 DIAGNOSIS — I10 Essential (primary) hypertension: Secondary | ICD-10-CM | POA: Diagnosis not present

## 2016-07-19 ENCOUNTER — Other Ambulatory Visit: Payer: Self-pay | Admitting: Gastroenterology

## 2016-07-19 ENCOUNTER — Encounter (HOSPITAL_COMMUNITY): Admission: RE | Payer: Self-pay | Source: Ambulatory Visit

## 2016-07-19 ENCOUNTER — Ambulatory Visit (HOSPITAL_COMMUNITY): Admission: RE | Admit: 2016-07-19 | Payer: PPO | Source: Ambulatory Visit | Admitting: Gastroenterology

## 2016-07-19 SURGERY — COLONOSCOPY WITH PROPOFOL
Anesthesia: Monitor Anesthesia Care

## 2016-07-25 DIAGNOSIS — H401131 Primary open-angle glaucoma, bilateral, mild stage: Secondary | ICD-10-CM | POA: Diagnosis not present

## 2016-08-02 ENCOUNTER — Other Ambulatory Visit: Payer: Self-pay | Admitting: Endocrinology

## 2016-08-03 ENCOUNTER — Other Ambulatory Visit: Payer: Self-pay

## 2016-08-14 ENCOUNTER — Other Ambulatory Visit: Payer: Self-pay | Admitting: Endocrinology

## 2016-08-14 MED ORDER — BROMOCRIPTINE MESYLATE 2.5 MG PO TABS: 1.2500 mg | ORAL_TABLET | ORAL | 1 refills | Status: DC

## 2016-08-17 ENCOUNTER — Encounter (HOSPITAL_COMMUNITY): Payer: Self-pay | Admitting: *Deleted

## 2016-08-22 ENCOUNTER — Ambulatory Visit (HOSPITAL_COMMUNITY)
Admission: RE | Admit: 2016-08-22 | Discharge: 2016-08-22 | Disposition: A | Discharge status: Home / Self Care | Payer: PPO | Source: Ambulatory Visit | Attending: Gastroenterology | Admitting: Gastroenterology

## 2016-08-22 ENCOUNTER — Ambulatory Visit (HOSPITAL_COMMUNITY): Payer: PPO | Admitting: Anesthesiology

## 2016-08-22 ENCOUNTER — Encounter (HOSPITAL_COMMUNITY)
Admission: RE | Disposition: A | Discharge status: Home / Self Care | Payer: Self-pay | Source: Ambulatory Visit | Attending: Gastroenterology

## 2016-08-22 ENCOUNTER — Encounter (HOSPITAL_COMMUNITY): Payer: Self-pay

## 2016-08-22 DIAGNOSIS — I739 Peripheral vascular disease, unspecified: Secondary | ICD-10-CM | POA: Insufficient documentation

## 2016-08-22 DIAGNOSIS — Z87891 Personal history of nicotine dependence: Secondary | ICD-10-CM | POA: Diagnosis not present

## 2016-08-22 DIAGNOSIS — I2510 Atherosclerotic heart disease of native coronary artery without angina pectoris: Secondary | ICD-10-CM | POA: Diagnosis not present

## 2016-08-22 DIAGNOSIS — I10 Essential (primary) hypertension: Secondary | ICD-10-CM | POA: Insufficient documentation

## 2016-08-22 DIAGNOSIS — Z1211 Encounter for screening for malignant neoplasm of colon: Secondary | ICD-10-CM | POA: Diagnosis not present

## 2016-08-22 DIAGNOSIS — Z7901 Long term (current) use of anticoagulants: Secondary | ICD-10-CM | POA: Diagnosis not present

## 2016-08-22 DIAGNOSIS — Z8673 Personal history of transient ischemic attack (TIA), and cerebral infarction without residual deficits: Secondary | ICD-10-CM | POA: Insufficient documentation

## 2016-08-22 DIAGNOSIS — Z86711 Personal history of pulmonary embolism: Secondary | ICD-10-CM | POA: Diagnosis not present

## 2016-08-22 HISTORY — PX: COLONOSCOPY: SHX5424

## 2016-08-22 HISTORY — DX: Rash and other nonspecific skin eruption: R21

## 2016-08-22 SURGERY — COLONOSCOPY
Anesthesia: Monitor Anesthesia Care

## 2016-08-22 MED ORDER — PROPOFOL 10 MG/ML IV BOLUS
INTRAVENOUS | Status: AC
  Filled 2016-08-22: qty 40

## 2016-08-22 MED ORDER — SODIUM CHLORIDE 0.9 % IV SOLN: INTRAVENOUS | Status: DC

## 2016-08-22 MED ORDER — ONDANSETRON HCL 4 MG/2ML IJ SOLN
INTRAMUSCULAR | Status: AC
  Filled 2016-08-22: qty 2

## 2016-08-22 SURGICAL SUPPLY — 22 items

## 2016-08-22 NOTE — Anesthesia Preprocedure Evaluation (Deleted)
Anesthesia Evaluation  Patient identified by MRN, date of birth, ID band Patient awake    Reviewed: Allergy & Precautions, H&P , NPO status , Patient's Chart, lab work & pertinent test results  History of Anesthesia Complications Negative for: history of anesthetic complications  Airway Mallampati: II  TM Distance: >3 FB Neck ROM: Full    Dental no notable dental hx. (+) Teeth Intact, Dental Advisory Given   Pulmonary neg pulmonary ROS, asthma , former smoker,  H/o PE  a year ago, off of Coumadin for 4 weeks   Pulmonary exam normal breath sounds clear to auscultation       Cardiovascular hypertension, Pt. on medications + CAD and + Peripheral Vascular Disease  Normal cardiovascular exam Rhythm:Regular Rate:Normal     Neuro/Psych  Headaches, CVA negative neurological ROS     GI/Hepatic negative GI ROS, Neg liver ROS,   Endo/Other  negative endocrine ROS  Renal/GU negative Renal ROS     Musculoskeletal   Abdominal   Peds  Hematology negative hematology ROS (+)   Anesthesia Other Findings   Reproductive/Obstetrics                             Anesthesia Physical  Anesthesia Plan  ASA: III  Anesthesia Plan: MAC   Post-op Pain Management:    Induction: Intravenous  Airway Management Planned: Natural Airway and Simple Face Mask  Additional Equipment:   Intra-op Plan:   Post-operative Plan:   Informed Consent: I have reviewed the patients History and Physical, chart, labs and discussed the procedure including the risks, benefits and alternatives for the proposed anesthesia with the patient or authorized representative who has indicated his/her understanding and acceptance.   Dental advisory given  Plan Discussed with: CRNA and Surgeon  Anesthesia Plan Comments:         Anesthesia Quick Evaluation

## 2016-08-22 NOTE — H&P (Signed)
Procedure: Screening colonoscopy. Normal screening colonoscopy was performed on 10/03/2005. Chronic Coumadin therapy started in 2013 to prevent recurrent pulmonary emboli.  History: The patient is a 76 year old male born September 05, 1940. He is scheduled to undergo a repeat screening colonoscopy today.  He stopped taking Coumadin 5 days ago. He started taking Lovenox subcutaneously each evening 3 days ago. His last dose of Lovenox was over 24 hours ago.  Past medical history: Tonsillectomy. Right herniorrhaphy. Knee arthroscopy. Left herniorrhaphy. Hypertension. Pituitary adenoma. Thyroiditis. Deep venous thrombosis. Recurrent pulmonary emboli. Elevated PSA. Coronary artery calcification. Left posterior subcortical frontal stroke in April 2017.  Exam: The patient is alert and lying comfortably on the endoscopy stretcher. Abdomen is soft and nontender to palpation. Lungs are clear to auscultation. Cardiac exam reveals a regular rhythm.  Plan: Proceed with screening colonoscopy

## 2016-08-22 NOTE — Discharge Instructions (Signed)
YOU HAD AN ENDOSCOPIC PROCEDURE TODAY: Refer to the procedure report and other information in the discharge instructions given to you for any specific questions about what was found during the examination. If this information does not answer your questions, please call Dr. Wynetta Emery office at [phone] to clarify.   YOU SHOULD EXPECT: Some feelings of bloating in the abdomen. Passage of more gas than usual. Walking can help get rid of the air that was put into your GI tract during the procedure and reduce the bloating. If you had a lower endoscopy (such as a colonoscopy or flexible sigmoidoscopy) you may notice spotting of blood in your stool or on the toilet paper. Some abdominal soreness may be present for a day or two, also.  DIET: Your first meal following the procedure should be a light meal and then it is ok to progress to your normal diet. A half-sandwich or bowl of soup is an example of a good first meal. Heavy or fried foods are harder to digest and may make you feel nauseous or bloated. Drink plenty of fluids but you should avoid alcoholic beverages for 24 hours. If you had a esophageal dilation, please see attached instructions for diet.   ACTIVITY: Your care partner should take you home directly after the procedure. You should plan to take it easy, moving slowly for the rest of the day. You can resume normal activity the day after the procedure however YOU SHOULD NOT DRIVE, use power tools, machinery or perform tasks that involve climbing or major physical exertion for 24 hours (because of the sedation medicines used during the test). No sedation given.  SYMPTOMS TO REPORT IMMEDIATELY: A gastroenterologist can be reached at any hour. Please call [phone] for any of the following symptoms:  Following lower endoscopy (colonoscopy, flexible sigmoidoscopy) Excessive amounts of blood in the stool  Significant tenderness, worsening of abdominal pains  Swelling of the abdomen that is new,  acute  Fever of 100 or higher  Following upper endoscopy (EGD, EUS, ERCP, esophageal dilation) Vomiting of blood or coffee ground material  New, significant abdominal pain  New, significant chest pain or pain under the shoulder blades  Painful or persistently difficult swallowing  New shortness of breath  Black, tarry-looking or red, bloody stools  FOLLOW UP:  If any biopsies were taken you will be contacted by phone or by letter within the next 1-3 weeks. Call [phone]  if you have not heard about the biopsies in 3 weeks.  Please also call with any specific questions about appointments or follow up tests.

## 2016-08-22 NOTE — Op Note (Signed)
[facility] Patient Name: Jay Day Procedure Date: 08/22/2016 MRN: [id number] Attending MD: Garlan Fair , MD Date of Birth: 1941/05/14 CSN: [id number] Age: 76 Admit Type: Outpatient Procedure:                Colonoscopy Indications:              Screening for colorectal malignant neoplasm Providers:                Garlan Fair, MD, Cleda Daub, RN, Elspeth Cho Tech., Technician Referring MD:              Medicines:                None Complications:            No immediate complications. Estimated Blood Loss:     Estimated blood loss: none. Procedure:                Pre-Anesthesia Assessment:                           - Prior to the procedure, a History and Physical                            was performed, and patient medications and                            allergies were reviewed. The patient's tolerance of                            previous anesthesia was also reviewed. The risks                            and benefits of the procedure and the sedation                            options and risks were discussed with the patient.                            All questions were answered, and informed consent                            was obtained. Prior Anticoagulants: The patient has                            taken Coumadin (warfarin), last dose was 5 days                            prior to procedure. ASA Grade Assessment: II - A                            patient with mild systemic disease. After reviewing  the risks and benefits, the patient was deemed in                            satisfactory condition to undergo the procedure.                           After obtaining informed consent, the colonoscope                            was passed under direct vision. Throughout the                            procedure, the patient's blood pressure, pulse, and                            oxygen  saturations were monitored continuously. The                            EC-[id number] ([id number]) scope was introduced through                            the anus and advanced to the the cecum, identified                            by appendiceal orifice and ileocecal valve. The                            colonoscopy was performed without difficulty. The                            patient tolerated the procedure well. The quality                            of the bowel preparation was good. The appendiceal                            orifice and the rectum were photographed. Scope In: 12:43:51 PM Scope Out: 1:01:57 PM Scope Withdrawal Time: 0 hours 13 minutes 42 seconds  Total Procedure Duration: 0 hours 18 minutes 6 seconds  Findings:      The perianal and digital rectal examinations were normal.      The entire examined colon appeared normal. Impression:               - The entire examined colon is normal.                           - No specimens collected. Moderate Sedation:      No sedation. Recommendation:           - Patient has a contact number available for                            emergencies. The signs and symptoms of potential  delayed complications were discussed with the                            patient. Return to normal activities tomorrow.                            Written discharge instructions were provided to the                            patient.                           - Repeat colonoscopy is not recommended for                            screening purposes.                           - Resume previous diet.                           - Continue present medications. Procedure Code(s):        --- Professional ---                           N1916, Colorectal cancer screening; colonoscopy on                            individual not meeting criteria for high risk Diagnosis Code(s):        --- Professional ---                           Z12.11,  Encounter for screening for malignant                            neoplasm of colon CPT copyright 2016 [employer]. All rights reserved. The codes documented in this report are preliminary and upon coder review may  be revised to meet current compliance requirements. Earle Gell, MD Garlan Fair, MD 08/22/2016 1:07:33 PM This report has been signed electronically. Number of Addenda: 0

## 2016-08-24 ENCOUNTER — Encounter (HOSPITAL_COMMUNITY): Payer: Self-pay | Admitting: Gastroenterology

## 2016-09-07 DIAGNOSIS — Z7901 Long term (current) use of anticoagulants: Secondary | ICD-10-CM | POA: Diagnosis not present

## 2016-09-14 DIAGNOSIS — C61 Malignant neoplasm of prostate: Secondary | ICD-10-CM | POA: Diagnosis not present

## 2016-09-14 DIAGNOSIS — R55 Syncope and collapse: Secondary | ICD-10-CM | POA: Diagnosis not present

## 2016-09-14 DIAGNOSIS — I6930 Unspecified sequelae of cerebral infarction: Secondary | ICD-10-CM | POA: Diagnosis not present

## 2016-09-14 DIAGNOSIS — Z86711 Personal history of pulmonary embolism: Secondary | ICD-10-CM | POA: Diagnosis not present

## 2016-09-14 DIAGNOSIS — D352 Benign neoplasm of pituitary gland: Secondary | ICD-10-CM | POA: Diagnosis not present

## 2016-09-14 DIAGNOSIS — I10 Essential (primary) hypertension: Secondary | ICD-10-CM | POA: Diagnosis not present

## 2016-09-14 DIAGNOSIS — I2510 Atherosclerotic heart disease of native coronary artery without angina pectoris: Secondary | ICD-10-CM | POA: Diagnosis not present

## 2016-09-28 DIAGNOSIS — Z7901 Long term (current) use of anticoagulants: Secondary | ICD-10-CM | POA: Diagnosis not present

## 2016-10-26 DIAGNOSIS — Z7901 Long term (current) use of anticoagulants: Secondary | ICD-10-CM | POA: Diagnosis not present

## 2016-11-14 ENCOUNTER — Telehealth: Payer: Self-pay | Admitting: Endocrinology

## 2016-11-14 NOTE — Telephone Encounter (Signed)
Please send one month's prescription and schedule for follow-up with labs

## 2016-11-14 NOTE — Telephone Encounter (Signed)
This patient does not have a follow up appointment and was supposed to come back around 10/2016. Please advise if okay to refill or refuse with appt needed.

## 2016-11-23 DIAGNOSIS — Z7901 Long term (current) use of anticoagulants: Secondary | ICD-10-CM | POA: Diagnosis not present

## 2016-11-30 DIAGNOSIS — C61 Malignant neoplasm of prostate: Secondary | ICD-10-CM | POA: Diagnosis not present

## 2016-11-30 DIAGNOSIS — N5201 Erectile dysfunction due to arterial insufficiency: Secondary | ICD-10-CM | POA: Diagnosis not present

## 2016-11-30 DIAGNOSIS — H401131 Primary open-angle glaucoma, bilateral, mild stage: Secondary | ICD-10-CM | POA: Diagnosis not present

## 2016-12-17 ENCOUNTER — Other Ambulatory Visit: Payer: Self-pay | Admitting: Endocrinology

## 2016-12-21 DIAGNOSIS — Z7901 Long term (current) use of anticoagulants: Secondary | ICD-10-CM | POA: Diagnosis not present

## 2017-01-18 DIAGNOSIS — Z7901 Long term (current) use of anticoagulants: Secondary | ICD-10-CM | POA: Diagnosis not present

## 2017-02-07 ENCOUNTER — Telehealth: Payer: Self-pay | Admitting: Endocrinology

## 2017-02-07 NOTE — Telephone Encounter (Signed)
Please refill for 30 days and have him schedule follow-up with labs

## 2017-02-07 NOTE — Telephone Encounter (Signed)
Please advise if okay to refill. Last OV was on 04/21/2016. I do not see that a follow up was scheduled.

## 2017-02-08 NOTE — Telephone Encounter (Signed)
I see this was ordered by Lattie Haw on 02/08/2017.   Colletta [place], please schedule follow up visit with labs for Dr. [name].   Thank you!

## 2017-02-17 ENCOUNTER — Other Ambulatory Visit (INDEPENDENT_AMBULATORY_CARE_PROVIDER_SITE_OTHER): Payer: PPO

## 2017-02-17 DIAGNOSIS — D352 Benign neoplasm of pituitary gland: Secondary | ICD-10-CM | POA: Diagnosis not present

## 2017-02-17 LAB — TESTOSTERONE: Testosterone: 520.53 ng/dL (ref 300.00–890.00)

## 2017-02-18 LAB — PROLACTIN: PROLACTIN: 15.9 ng/mL — AB (ref 4.0–15.2)

## 2017-02-21 ENCOUNTER — Ambulatory Visit (INDEPENDENT_AMBULATORY_CARE_PROVIDER_SITE_OTHER): Payer: PPO | Admitting: Endocrinology

## 2017-02-21 ENCOUNTER — Encounter: Payer: Self-pay | Admitting: Endocrinology

## 2017-02-21 VITALS — BP 116/66 | HR 53 | Ht 72.25 in | Wt 190.0 lb

## 2017-02-21 DIAGNOSIS — D352 Benign neoplasm of pituitary gland: Secondary | ICD-10-CM

## 2017-02-21 NOTE — Progress Notes (Addendum)
Patient ID: Jay Day, male   DOB: 1940-12-18, 76 y.o.   MRN: [id number]          Chief complaint:  Follow-up of prolactinoma tumor  History of Present Illness     Prior history: Apparently in 2003 he was being evaluated by his PCP for complaints of decreased libido. 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   He was treated by his internist with bromocriptine.  Follow-up MRI in 04/2012 did not show any change in size of the tumor   RECENT HISTORY:  .  Patient has been treated with low doses of bromocriptine which he takes at night  This had been started by his PCP Because of his relatively prolactin level with 2.5 mg of bromocriptine he was tried on half tablet in 2012 and prolactin was normal at 5.2 subsequently He had been taking 2.5 mg again for some time but his prolactin level in 11/2011 was only 2.0, in 7/14 was 1.9 and in 01/2014 his prolactin level was low at 1.6  No evidence of hypopituitarism on his evaluation in 10/2014 MRI brain scan in 06/2016 showed:   T2 hyperintense focus centered within the right aspect of the pituitary gland and cavernous sinus best appreciated on the coronal sequence is decreased in size from the 2003 pituitary MRI of the brain and probably represents residual adenoma.  When he was not taking bromocriptine  his prolactin went up to 31 He was on a half tablet 3 times a week subsequently with some adjustments of the dose again  However with the prolactin level going back again down to 3.5 in 11/17 he was changed from a half a tablet of bromocriptine daily with a half tablet 3 times a week  He does not complain of any headaches or visual changes or fatigue   Prolactin now is upper normal Testosterone level is quite normal again   Lab Results   Component Value Date   PROLACTIN 15.9 (H) 02/17/2017   PROLACTIN 3.5 (L) 04/18/2016   PROLACTIN 18.2 (H) 10/16/2015   PROLACTIN 30.9 (H) 04/13/2015   PROLACTIN 3.0 12/05/2014   PROLACTIN 6.9 10/13/2014      Allergies as of 02/21/2017      Reactions   Gluten Meal Other (See Comments)   Gluten Sensitivity (reaction undefined) NO BREAD   Wheat Bran Other (See Comments)   Reaction undefined      Medication List       Accurate as of 02/21/17 10:45 AM. Always use your most recent med list.          B-complex with vitamin C tablet Take 1 tablet by mouth daily.   bromocriptine 2.5 MG tablet Commonly known as:  PARLODEL take 1/2 tablet by mouth THREE TIMES A WEEK ON MONDAYS, WEDNESDAY AND FRIDAY PLEASE SCHEDULE AN APPOINTMENT FOR FURTHER REFILLS   captopril 25 MG tablet Commonly known as:  CAPOTEN Take 25 mg by mouth every evening. Reported on 10/16/2015   chlorpheniramine-HYDROcodone 10-8 MG/5ML Suer Commonly known as:  TUSSIONEX Take 5 mLs by mouth every 12 (twelve) hours as needed for cough.   enoxaparin 60 MG/0.6ML injection Commonly known as:  LOVENOX Inject 60 mg into the skin daily. As directed, finished on 08-21-16   eucerin lotion Apply topically as  needed for dry skin.   ibuprofen 200 MG tablet Commonly known as:  ADVIL,MOTRIN Take 400 mg by mouth daily as needed for headache or moderate pain.   latanoprost 0.005 % ophthalmic solution Commonly known as:  XALATAN place 1 drop into both eyes at bedtime PLS. SCHEDULE AN APPOINTMENT BEFORE FUTURE REFILLS   meclizine 12.5 MG tablet Commonly known as:  ANTIVERT Take 1 tablet (12.5 mg total) by mouth 3 (three) times daily as needed for dizziness.   Melatonin 3 MG Tabs Take 3 mg by mouth at bedtime as needed (sleep). Reported on 10/16/2015   metoprolol succinate 25 MG 24 hr tablet Commonly known as:  TOPROL-XL Take 1 tablet (25 mg total) by mouth daily.   NYQUIL PO Take 1 Dose by mouth daily as needed  (sleep/congestion).   Vitamin D3 5000 units Caps Take 5,000 Units by mouth daily.   warfarin 5 MG tablet Commonly known as:  COUMADIN Take 5 mg by mouth every evening. 7 days week Will stop prior to procedure       Allergies:  Allergies  Allergen Reactions  . Gluten Meal Other (See Comments)    Gluten Sensitivity (reaction undefined) NO BREAD  . Wheat Bran Other (See Comments)    Reaction undefined    Past Medical History:  Diagnosis Date  . Asthma    hx of as child  . Bronchitis    as a child  . Cataract   . Chronic anticoagulation 06/11/2013  . Clotting disorder ([place])   . 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 Elevated PSA    being monitored by physician  . Glaucoma 10/13/2011  . 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 Headache disorder 02/24/2015   occ  . Hypertension    sees [name], primary   . MI (myocardial infarction) ([place]) not sure when   scar tissue saw  . Peyronie's disease   . 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 Pituitary tumor    takes medication to manage  . Prostatitis   . Pulmonary embolism ([place]) yrs ago  . Rash    last 3 months chest and arms, saw md no tx given  . 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 COLONOSCOPY N/A 08/22/2016   Procedure: COLONOSCOPY;  Surgeon: Garlan Fair, MD;  Location: [facility];  Service: Endoscopy;  Laterality: N/A;  . colonscopy  2007  . EYE SURGERY     bilateral cataract surgery  . HERNIA REPAIR  1960  . INGUINAL HERNIA REPAIR  08/12/2011   Procedure: LAPAROSCOPIC INGUINAL HERNIA;  Surgeon: [name], MD;  Location: [place];  Service: General;  Laterality: Left;  . [place]   left   . LEFT HEART CATHETERIZATION WITH CORONARY ANGIOGRAM N/A 05/06/2014   Procedure: LEFT  HEART CATHETERIZATION WITH CORONARY ANGIOGRAM;  Surgeon: [name], MD;  Location: [facility] CATH LAB;  Service: Cardiovascular;  Laterality: N/A;  . TONSILLECTOMY     as a child    Family History  Problem Relation Age of Onset  . Pulmonary embolism Mother   . Kidney failure Mother   . Hypertension Mother   . Kidney Stones Mother   . Pulmonary embolism Father   . Prostate cancer Father   .  Pulmonary embolism Maternal Grandmother   . Pulmonary embolism Maternal Grandfather   . Pulmonary embolism Paternal Grandmother   . Pulmonary embolism Paternal Grandfather     Social History:  reports that he quit smoking about 46 years ago. His smoking use included Cigarettes. He has a 1.50 pack-year smoking history. He has never used smokeless tobacco. He reports that he does not drink alcohol or use drugs.  ROS   He had a CVA resulting with dizziness Which resolved  Blood pressure is normal without symptoms of lightheadedness  Wt Readings from Last 3 Encounters:  02/21/17 190 lb (86.2 kg)  08/22/16 180 lb (81.6 kg)  06/18/16 180 lb (81.6 kg)    General Examination:   BP 116/66   Pulse (!) 53   Ht 6' 0.25" (1.835 m)   Wt 190 lb (86.2 kg)   SpO2 98%   BMI 25.59 kg/m     Assessment/ Plan:  PROLACTINOMA: Although he had a 1.4 cm pituitary tumor at baseline his initial prolactin level was only moderately increased at 99  Has been very well controlled with very low doses of bromocriptine He has been unable to get off the medication as prolactin tends to get higher with stopping the medication  Currently on 1.25 mg 3 time a week as prolactin has gone back up to slightly above normal He is asymptomatic Recent MRI scan showed decrease in the pituitary adenoma size although this was not a dedicated pituitary MRI   Testosterone level has been maintained at the normal levels also  Discussed that we will continue to have him treated with bromocriptine unless his prolactin level goes down  low again For now he can take the bromocriptine half tablet 4 times a week  Follow-up in 6 months  Annick Dimaio 02/21/2017, 10:45 AM

## 2017-02-21 NOTE — Patient Instructions (Signed)
May take 1/2 pill 4 x weekly

## 2017-03-09 DIAGNOSIS — H401131 Primary open-angle glaucoma, bilateral, mild stage: Secondary | ICD-10-CM | POA: Diagnosis not present

## 2017-03-13 DIAGNOSIS — Z7901 Long term (current) use of anticoagulants: Secondary | ICD-10-CM | POA: Diagnosis not present

## 2017-03-17 ENCOUNTER — Other Ambulatory Visit: Payer: Self-pay | Admitting: Endocrinology

## 2017-03-29 DIAGNOSIS — N5201 Erectile dysfunction due to arterial insufficiency: Secondary | ICD-10-CM | POA: Diagnosis not present

## 2017-03-29 DIAGNOSIS — C61 Malignant neoplasm of prostate: Secondary | ICD-10-CM | POA: Diagnosis not present

## 2017-03-29 DIAGNOSIS — R310 Gross hematuria: Secondary | ICD-10-CM | POA: Diagnosis not present

## 2017-04-11 DIAGNOSIS — Z7901 Long term (current) use of anticoagulants: Secondary | ICD-10-CM | POA: Diagnosis not present

## 2017-04-27 ENCOUNTER — Other Ambulatory Visit: Payer: Self-pay | Admitting: Endocrinology

## 2017-04-27 DIAGNOSIS — I10 Essential (primary) hypertension: Secondary | ICD-10-CM | POA: Diagnosis not present

## 2017-04-27 DIAGNOSIS — Z86711 Personal history of pulmonary embolism: Secondary | ICD-10-CM | POA: Diagnosis not present

## 2017-04-27 DIAGNOSIS — Z7901 Long term (current) use of anticoagulants: Secondary | ICD-10-CM | POA: Diagnosis not present

## 2017-04-27 DIAGNOSIS — Z0000 Encounter for general adult medical examination without abnormal findings: Secondary | ICD-10-CM | POA: Diagnosis not present

## 2017-04-27 DIAGNOSIS — J329 Chronic sinusitis, unspecified: Secondary | ICD-10-CM | POA: Diagnosis not present

## 2017-04-27 DIAGNOSIS — I6930 Unspecified sequelae of cerebral infarction: Secondary | ICD-10-CM | POA: Diagnosis not present

## 2017-04-27 DIAGNOSIS — Z1389 Encounter for screening for other disorder: Secondary | ICD-10-CM | POA: Diagnosis not present

## 2017-05-02 ENCOUNTER — Other Ambulatory Visit: Payer: Self-pay

## 2017-05-02 NOTE — Patient Outreach (Signed)
[place] [facility]) Care Management  05/02/2017  JAZIR NEWEY [birth year] [id number]   Telephone Screen  Referral Date: 05/02/17 Referral Source: [name] Referral Reason: "member was found to be in sinus bradycardia with a HR of 51 but asymptomatic, member needs to f/u with PCP for med adjustment to BP meds" Insurance: HTA   Outreach attempt #1 to patient. No answer. RN CM left HIPAA compliant voicemail message along with contact info.     Plan: RN CM will make outreach attempt to patient within three business days if no return call.    [name], RN,BSN,CCM [place] Management Telephonic Care Management Coordinator Direct Phone: [phone] Toll Free: [phone] Fax: [phone]

## 2017-05-05 ENCOUNTER — Other Ambulatory Visit: Payer: Self-pay

## 2017-05-05 NOTE — Patient Outreach (Signed)
[place] [facility]) Care Management  05/05/2017  Jay Day 08-15-1940 [id number]   Telephone Screen  Referral Date: 05/02/17 Referral Source: [name] Referral Reason: "member was found to be in sinus bradycardia with a HR of 51 but asymptomatic, member needs to f/u with PCP for med adjustment to BP meds" Insurance: HTA     Outreach attempt #2 to patient. No answer at present.     Plan: RN CM will make outreach attempt to patient within one business day.    [name], RN,BSN,CCM [place] Management Telephonic Care Management Coordinator Direct Phone: [phone] Toll Free: [phone] Fax: [phone]

## 2017-05-08 ENCOUNTER — Other Ambulatory Visit: Payer: Self-pay

## 2017-05-08 NOTE — Patient Outreach (Signed)
[name]) Care Management  05/08/2017  [name] Day [birth year] [id number]    Telephone Screen  Referral Date:05/02/17 Referral Source:[name] Referral Reason:"member was found to be in sinus bradycardia with a HR of 51 but asymptomatic, member needs to f/u with PCP for med adjustment to BP meds" Insurance:HTA   Outreach attempt #3 to patient. No answer at present.      Plan: RN CM will send unsuccessful outreach letter to patient and close case if no response within 10 business days.   [name], RN,BSN,CCM [place] Management Telephonic Care Management Coordinator Direct Phone: [phone] Toll Free: [phone] Fax: [phone]

## 2017-05-15 DIAGNOSIS — Z7901 Long term (current) use of anticoagulants: Secondary | ICD-10-CM | POA: Diagnosis not present

## 2017-05-22 ENCOUNTER — Other Ambulatory Visit: Payer: Self-pay

## 2017-05-22 NOTE — Patient Outreach (Signed)
[place] [facility]) Care Management  05/22/2017  Jay Day Oct 13, 1940 [id number]   Telephone Screen  Referral Date:05/02/17 Referral Source:[name] Referral Reason:"member was found to be in sinus bradycardia with a HR of 51 but asymptomatic, member needs to f/u with PCP for med adjustment to BP meds" Insurance:HTA    Multiple attempts to establish contact with patient without success. No response from letter mailed to patient. Case is being closed at this time.      Plan:  RN CM will notify [facility] administrative assistant of case status.    [name], RN,BSN,CCM [place] Management Telephonic Care Management Coordinator Direct Phone: [phone] Toll Free: [phone] Fax: [phone]

## 2017-05-29 DIAGNOSIS — Z7901 Long term (current) use of anticoagulants: Secondary | ICD-10-CM | POA: Diagnosis not present

## 2017-06-08 ENCOUNTER — Other Ambulatory Visit: Payer: Self-pay | Admitting: Endocrinology

## 2017-07-05 DIAGNOSIS — R310 Gross hematuria: Secondary | ICD-10-CM | POA: Diagnosis not present

## 2017-07-05 DIAGNOSIS — C61 Malignant neoplasm of prostate: Secondary | ICD-10-CM | POA: Diagnosis not present

## 2017-07-05 DIAGNOSIS — N5201 Erectile dysfunction due to arterial insufficiency: Secondary | ICD-10-CM | POA: Diagnosis not present

## 2017-07-06 ENCOUNTER — Other Ambulatory Visit: Payer: Self-pay | Admitting: Urology

## 2017-07-06 DIAGNOSIS — C61 Malignant neoplasm of prostate: Secondary | ICD-10-CM

## 2017-07-10 ENCOUNTER — Other Ambulatory Visit: Payer: Self-pay | Admitting: Endocrinology

## 2017-07-20 ENCOUNTER — Other Ambulatory Visit: Payer: PPO

## 2017-07-24 ENCOUNTER — Ambulatory Visit
Admission: RE | Admit: 2017-07-24 | Discharge: 2017-07-24 | Disposition: A | Discharge status: Home / Self Care | Payer: PPO | Source: Ambulatory Visit | Attending: Urology | Admitting: Urology

## 2017-07-24 DIAGNOSIS — C61 Malignant neoplasm of prostate: Secondary | ICD-10-CM

## 2017-07-24 MED ORDER — GADOBENATE DIMEGLUMINE 529 MG/ML IV SOLN
17.0000 mL | Freq: Once | INTRAVENOUS | Status: AC | PRN
  Administered 2017-07-24: 17 mL via INTRAVENOUS

## 2017-08-04 DIAGNOSIS — H401131 Primary open-angle glaucoma, bilateral, mild stage: Secondary | ICD-10-CM | POA: Diagnosis not present

## 2017-08-08 ENCOUNTER — Other Ambulatory Visit: Payer: Self-pay | Admitting: Endocrinology

## 2017-08-18 ENCOUNTER — Other Ambulatory Visit (INDEPENDENT_AMBULATORY_CARE_PROVIDER_SITE_OTHER): Payer: PPO

## 2017-08-18 DIAGNOSIS — D352 Benign neoplasm of pituitary gland: Secondary | ICD-10-CM | POA: Diagnosis not present

## 2017-08-18 LAB — T4, FREE: Free T4: 0.68 ng/dL (ref 0.60–1.60)

## 2017-08-19 LAB — PROLACTIN: Prolactin: 15.6 ng/mL — ABNORMAL HIGH (ref 4.0–15.2)

## 2017-08-21 ENCOUNTER — Ambulatory Visit: Payer: PPO | Admitting: Endocrinology

## 2017-08-22 ENCOUNTER — Ambulatory Visit (INDEPENDENT_AMBULATORY_CARE_PROVIDER_SITE_OTHER): Payer: Medicare Other | Admitting: Endocrinology

## 2017-08-22 ENCOUNTER — Encounter: Payer: Self-pay | Admitting: Endocrinology

## 2017-08-22 VITALS — BP 120/72 | HR 54 | Resp 16 | Wt 192.0 lb

## 2017-08-22 DIAGNOSIS — D352 Benign neoplasm of pituitary gland: Secondary | ICD-10-CM | POA: Diagnosis not present

## 2017-08-22 NOTE — Patient Instructions (Signed)
Take rx 4/7 days

## 2017-08-22 NOTE — Progress Notes (Signed)
Patient ID: Jay Day, male   DOB: 03/26/1941, 77 y.o.   MRN: [id number]          Chief complaint:  Follow-up of prolactinoma tumor  History of Present Illness     Prior history: Apparently in 2003 he was being evaluated by his PCP for complaints of decreased libido. 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   He was treated by his internist with bromocriptine.  Follow-up MRI in 04/2012 did not show any change in size of the tumor   RECENT HISTORY:  .  Patient has been treated with low doses of bromocriptine which he takes at night  This had been started by his PCP Because of his relatively prolactin level with 2.5 mg of bromocriptine he was tried on half tablet in 2012 and prolactin was normal at 5.2 subsequently He had been taking 2.5 mg again for some time but his prolactin level in 11/2011 was only 2.0, in 7/14 was 1.9 and in 01/2014 his prolactin level was low at 1.6  No evidence of hypopituitarism on his evaluation in 10/2014 MRI brain scan in 06/2016 showed:   T2 hyperintense focus centered within the right aspect of the pituitary gland and cavernous sinus best appreciated on the coronal sequence is decreased in size from the 2003 pituitary MRI of the brain and probably represents residual adenoma.  When he was not taking bromocriptine  his prolactin went up to 31 He was on a half tablet 3 times a week subsequently with some adjustments of the dose again  However with the prolactin level going back again down to 3.5 in 11/17 he was changed from a half a tablet of bromocriptine daily down to a half tablet 3 times a week More recently his prolactin has been mildly above the normal range For this reason he was told to take normal prescription in 4 days a week but he forgot and  is still taking it 3 days a week  He does not complain of any headaches or visual changes or any fatigue   Prolactin now is again slightly above normal Testosterone level is quite normal in 2018  Repeat free T4 level is low normal but he does not complain of any tiredness, cold intolerance or weight change  Lab Results  Component Value Date   PROLACTIN 15.6 (H) 08/18/2017   PROLACTIN 15.9 (H) 02/17/2017   PROLACTIN 3.5 (L) 04/18/2016   PROLACTIN 18.2 (H) 10/16/2015   PROLACTIN 30.9 (H) 04/13/2015   PROLACTIN 3.0 12/05/2014   PROLACTIN 6.9 10/13/2014    Lab Results  Component Value Date   FREET4 0.68 08/18/2017   FREET4 0.86 10/13/2014     Allergies as of 08/22/2017      Reactions   Gluten Meal Other (See Comments)   Gluten Sensitivity (reaction undefined) NO BREAD   Wheat Bran Other (See Comments)   Reaction undefined      Medication List        Accurate as of 08/22/17  9:47 AM. Always use your most recent med list.          amoxicillin 875 MG tablet Commonly known as:  AMOXIL take 1 tablet by mouth twice a day for DENTAL INFECTION   B-complex with vitamin C tablet Take 1  tablet by mouth daily.   bromocriptine 2.5 MG tablet Commonly known as:  PARLODEL take 1/2 tablet by mouth three times a week ON MONDAY,WEDNESDAY AND FRIDAY.MAKE DOCTORS APPOINTMENT FOR MORE REFILLS   captopril 25 MG tablet Commonly known as:  CAPOTEN Take 25 mg by mouth every evening. Reported on 10/16/2015   chlorpheniramine-HYDROcodone 10-8 MG/5ML Suer Commonly known as:  TUSSIONEX Take 5 mLs by mouth every 12 (twelve) hours as needed for cough.   eucerin lotion Apply topically as needed for dry skin.   ibuprofen 200 MG tablet Commonly known as:  ADVIL,MOTRIN Take 400 mg by mouth daily as needed for headache or moderate pain.   latanoprost 0.005 % ophthalmic solution Commonly known as:  XALATAN place 1 drop into both eyes at bedtime PLS. SCHEDULE AN APPOINTMENT BEFORE FUTURE  REFILLS   meclizine 12.5 MG tablet Commonly known as:  ANTIVERT Take 1 tablet (12.5 mg total) by mouth 3 (three) times daily as needed for dizziness.   Melatonin 3 MG Tabs Take 3 mg by mouth at bedtime as needed (sleep). Reported on 10/16/2015   metoprolol succinate 25 MG 24 hr tablet Commonly known as:  TOPROL-XL Take 1 tablet (25 mg total) by mouth daily.   NYQUIL PO Take 1 Dose by mouth daily as needed (sleep/congestion).   Vitamin D3 5000 units Caps Take 5,000 Units by mouth daily.   warfarin 5 MG tablet Commonly known as:  COUMADIN Take 5 mg by mouth every evening. 7 days week Will stop prior to procedure       Allergies:  Allergies  Allergen Reactions  . Gluten Meal Other (See Comments)    Gluten Sensitivity (reaction undefined) NO BREAD  . Wheat Bran Other (See Comments)    Reaction undefined    Past Medical History:  Diagnosis Date  . Asthma    hx of as child  . Bronchitis    as a child  . Cataract   . Chronic anticoagulation 06/11/2013  . Clotting disorder ([place])   . 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 Elevated PSA    being monitored by physician  . Glaucoma 10/13/2011  . 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 Headache disorder 02/24/2015   occ  . Hypertension    sees [name], primary   . MI (myocardial infarction) ([place]) not sure when   scar tissue saw  . Peyronie's disease   . 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 Pituitary tumor    takes medication to manage  . Prostatitis   . Pulmonary embolism ([place]) yrs ago  . Rash    last 3 months chest and arms, saw md no tx given  . 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 COLONOSCOPY N/A 08/22/2016   Procedure: COLONOSCOPY;  Surgeon: Garlan Fair, MD;  Location: [facility];   Service: Endoscopy;  Laterality: N/A;  . colonscopy  2007  . EYE SURGERY     bilateral cataract surgery  . HERNIA REPAIR  1960  . INGUINAL HERNIA REPAIR  08/12/2011   Procedure: LAPAROSCOPIC INGUINAL HERNIA;  Surgeon: [name], MD;  Location: [place];  Service: General;  Laterality: Left;  . [place]   left   . LEFT HEART CATHETERIZATION WITH CORONARY ANGIOGRAM  N/A 05/06/2014   Procedure: LEFT HEART CATHETERIZATION WITH CORONARY ANGIOGRAM;  Surgeon: [name], MD;  Location: [facility] CATH LAB;  Service: Cardiovascular;  Laterality: N/A;  . TONSILLECTOMY     as a child    Family History  Problem Relation Age of Onset  . Pulmonary embolism Mother   . Kidney failure Mother   . Hypertension Mother   . Kidney Stones Mother   . Pulmonary embolism Father   . Prostate cancer Father   . Pulmonary embolism Maternal Grandmother   . Pulmonary embolism Maternal Grandfather   . Pulmonary embolism Paternal Grandmother   . Pulmonary embolism Paternal Grandfather     Social History:  reports that he quit smoking about 47 years ago. His smoking use included cigarettes. He has a 1.50 pack-year smoking history. he has never used smokeless tobacco. He reports that he does not drink alcohol or use drugs.  ROS  He is still on captopril for hypertension  General Examination:   BP 120/72 (BP Location: Left Arm, Patient Position: Sitting, Cuff Size: Normal)   Pulse (!) 54   Resp 16   Wt 192 lb (87.1 kg)   SpO2 97%   BMI 25.86 kg/m     Assessment/ Plan:  PROLACTINOMA: He had a 1.4 cm pituitary tumor at baseline but his initial prolactin level was only moderately increased at 99  Has been well controlled with low doses of bromocriptine, has been on treatment for several years now He has been unable to get off the medication as prolactin tends to get higher with stopping the medication  Currently on 1.25 mg 3 time a week the prolactin has been slightly above normal twice in a row  now Although he was supposed to increase the dose to another time once a week he did not do so  Has not had any headaches, fatigue or symptoms of hypogonadism and last MRI scan and does she will relatively small tumor  Testosterone level has been maintained at the normal levels   Although his free T4 level is low normal he is asymptomatic at this time  Recommendations: Increase bromocriptine by 1/2 tablet weekly and he can do that on the weekend Recheck free T4 and testosterone on the next visit  Follow-up in 4 months  Hawraa Stambaugh 08/22/2017, 9:47 AM

## 2017-09-14 ENCOUNTER — Other Ambulatory Visit: Payer: Self-pay | Admitting: Endocrinology

## 2017-09-15 ENCOUNTER — Telehealth: Payer: Self-pay | Admitting: Endocrinology

## 2017-09-15 ENCOUNTER — Other Ambulatory Visit: Payer: Self-pay | Admitting: Endocrinology

## 2017-09-15 MED ORDER — BROMOCRIPTINE MESYLATE 2.5 MG PO TABS: ORAL_TABLET | ORAL | 5 refills | Status: DC

## 2017-09-15 NOTE — Telephone Encounter (Signed)
Pt has appt 12/22/17 is it ok to fill for 8 tabs per original rx or 2 per most recent rx? Please advise

## 2017-09-15 NOTE — Telephone Encounter (Signed)
Refill submitted. 

## 2017-09-15 NOTE — Telephone Encounter (Signed)
[facility] called re: Need refill for Bromocrittine 2.5 sent to fax# [phone] or verbal call ph# [phone]

## 2017-10-17 ENCOUNTER — Other Ambulatory Visit: Payer: Self-pay | Admitting: Internal Medicine

## 2017-10-17 ENCOUNTER — Ambulatory Visit
Admission: RE | Admit: 2017-10-17 | Discharge: 2017-10-17 | Disposition: A | Discharge status: Home / Self Care | Payer: Medicare Other | Source: Ambulatory Visit | Attending: Internal Medicine | Admitting: Internal Medicine

## 2017-10-17 DIAGNOSIS — M25511 Pain in right shoulder: Secondary | ICD-10-CM

## 2017-11-15 DIAGNOSIS — D099 Carcinoma in situ, unspecified: Secondary | ICD-10-CM

## 2017-11-15 DIAGNOSIS — C4492 Squamous cell carcinoma of skin, unspecified: Secondary | ICD-10-CM

## 2017-11-15 HISTORY — DX: Squamous cell carcinoma of skin, unspecified: C44.92

## 2017-11-15 HISTORY — DX: Carcinoma in situ, unspecified: D09.9

## 2017-12-19 ENCOUNTER — Other Ambulatory Visit (INDEPENDENT_AMBULATORY_CARE_PROVIDER_SITE_OTHER): Payer: Medicare Other

## 2017-12-19 DIAGNOSIS — D352 Benign neoplasm of pituitary gland: Secondary | ICD-10-CM

## 2017-12-19 LAB — T4, FREE: FREE T4: 0.92 ng/dL (ref 0.60–1.60)

## 2017-12-19 LAB — TESTOSTERONE: TESTOSTERONE: 555.85 ng/dL (ref 300.00–890.00)

## 2017-12-20 LAB — PROLACTIN: Prolactin: 4.4 ng/mL (ref 4.0–15.2)

## 2017-12-22 ENCOUNTER — Encounter: Payer: Self-pay | Admitting: Endocrinology

## 2017-12-22 ENCOUNTER — Ambulatory Visit: Payer: Medicare Other | Admitting: Endocrinology

## 2017-12-22 VITALS — BP 110/78 | HR 58 | Ht 72.25 in | Wt 191.8 lb

## 2017-12-22 DIAGNOSIS — D352 Benign neoplasm of pituitary gland: Secondary | ICD-10-CM | POA: Diagnosis not present

## 2017-12-22 NOTE — Progress Notes (Signed)
Patient ID: Jay Day, male   DOB: [birth year], 77 y.o.   MRN: [id number]          Chief complaint:  Follow-up of prolactinoma tumor  History of Present Illness     Prior history: Apparently in 2003 he was being evaluated by his PCP for complaints of decreased libido. 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   He was treated by his internist with bromocriptine.  Follow-up MRI in 04/2012 did not show any change in size of the tumor  Because of his relatively low prolactin level with 2.5 mg of bromocriptine he was tried on half tablet in 2012 and prolactin was normal at 5.2 subsequently He had been taking 2.5 mg again for some time but his prolactin level in 11/2011 was only 2.0, in 7/14 was 1.9 and in 01/2014 his prolactin level was low at 1.6   RECENT HISTORY:  .  Patient has been treated with low doses of bromocriptine which he takes at night   No evidence of hypopituitarism on his evaluation in 10/2014 MRI brain scan in 06/2016 showed:   T2 hyperintense focus centered within the right aspect of the pituitary gland and cavernous sinus best appreciated on the coronal sequence is decreased in size from the 2003 pituitary MRI of the brain and probably represents residual adenoma.  When he was not taking bromocriptine  his prolactin went up to 31  With the prolactin level going back again down to 3.5 in 11/17 he was changed from a half a tablet of bromocriptine daily down to a half tablet 3 times a week His prolactin level was tending to be over 15 since 9/18 For this reason he was told to take normal prescription in 4 days a week  He does not complain of any reduced libido, headaches, visual changes or any fatigue   Prolactin now is much improved at 4.4  Testosterone level is  normal  Repeat free T4 level is better, previously was low normal   Lab Results  Component Value Date   PROLACTIN 4.4 12/19/2017   PROLACTIN 15.6 (H) 08/18/2017   PROLACTIN 15.9 (H) 02/17/2017   PROLACTIN 3.5 (L) 04/18/2016   PROLACTIN 18.2 (H) 10/16/2015   PROLACTIN 30.9 (H) 04/13/2015   PROLACTIN 3.0 12/05/2014   PROLACTIN 6.9 10/13/2014    Lab Results  Component Value Date   FREET4 0.92 12/19/2017   FREET4 0.68 08/18/2017   FREET4 0.86 10/13/2014     Allergies as of 12/22/2017      Reactions   Gluten Meal Other (See Comments)   Gluten Sensitivity (reaction undefined) NO BREAD   Wheat Bran Other (See Comments)   Reaction undefined      Medication List        Accurate as of 12/22/17  9:52 AM. Always use your most recent med list.          B-complex with vitamin C tablet Take 1 tablet by mouth daily.   bromocriptine 2.5 MG tablet Commonly known as:  PARLODEL take 1/2 tablet by mouth 4 times a week   captopril 25 MG tablet Commonly known as:  CAPOTEN Take 25 mg by mouth every evening. Reported on 10/16/2015   chlorpheniramine-HYDROcodone 10-8 MG/5ML Suer Commonly known as:  TUSSIONEX Take 5  mLs by mouth every 12 (twelve) hours as needed for cough.   eucerin lotion Apply topically as needed for dry skin.   ibuprofen 200 MG tablet Commonly known as:  ADVIL,MOTRIN Take 400 mg by mouth daily as needed for headache or moderate pain.   latanoprost 0.005 % ophthalmic solution Commonly known as:  XALATAN place 1 drop into both eyes at bedtime PLS. SCHEDULE AN APPOINTMENT BEFORE FUTURE REFILLS   meclizine 12.5 MG tablet Commonly known as:  ANTIVERT Take 1 tablet (12.5 mg total) by mouth 3 (three) times daily as needed for dizziness.   Melatonin 3 MG Tabs Take 3 mg by mouth at bedtime as needed (sleep). Reported on 10/16/2015   metoprolol succinate 25 MG 24 hr tablet Commonly known as:  TOPROL-XL Take 1 tablet (25 mg total) by mouth daily.   NYQUIL  PO Take 1 Dose by mouth daily as needed (sleep/congestion).   Vitamin D3 5000 units Caps Take 5,000 Units by mouth daily.   warfarin 5 MG tablet Commonly known as:  COUMADIN Take 5 mg by mouth every evening. 7 days week Will stop prior to procedure       Allergies:  Allergies  Allergen Reactions  . Gluten Meal Other (See Comments)    Gluten Sensitivity (reaction undefined) NO BREAD  . Wheat Bran Other (See Comments)    Reaction undefined    Past Medical History:  Diagnosis Date  . Asthma    hx of as child  . Bronchitis    as a child  . Cataract   . Chronic anticoagulation 06/11/2013  . Clotting disorder ([place])   . 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 Elevated PSA    being monitored by physician  . Glaucoma 10/13/2011  . 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 Headache disorder 02/24/2015   occ  . Hypertension    sees [name], primary   . MI (myocardial infarction) ([place]) not sure when   scar tissue saw  . Peyronie's disease   . Pituitary adenoma ([place]) 10/13/2011  . Pituitary tumor    takes medication to manage  . Prostatitis   . Pulmonary embolism ([place]) yrs ago  . Rash    last 3 months chest and arms, saw md no tx given  . 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 COLONOSCOPY N/A 08/22/2016   Procedure: COLONOSCOPY;  Surgeon: Garlan Fair, MD;  Location: [facility];  Service: Endoscopy;  Laterality: N/A;  . colonscopy  2007  . EYE SURGERY     bilateral cataract surgery  . HERNIA REPAIR  1960  . INGUINAL HERNIA REPAIR  08/12/2011   Procedure: LAPAROSCOPIC INGUINAL HERNIA;  Surgeon: [name], MD;  Location: [facility];  Service: General;  Laterality: Left;  . [place]   left   . LEFT HEART CATHETERIZATION WITH CORONARY  ANGIOGRAM N/A 05/06/2014   Procedure: LEFT HEART CATHETERIZATION WITH CORONARY ANGIOGRAM;  Surgeon: [name], MD;  Location: [facility] CATH LAB;  Service: Cardiovascular;  Laterality: N/A;  . TONSILLECTOMY     as a child    Family History  Problem Relation Age of Onset  . Pulmonary embolism Mother   . Kidney failure Mother   . Hypertension Mother   .  Kidney Stones Mother   . Pulmonary embolism Father   . Prostate cancer Father   . Pulmonary embolism Maternal Grandmother   . Pulmonary embolism Maternal Grandfather   . Pulmonary embolism Paternal Grandmother   . Pulmonary embolism Paternal Grandfather     Social History:  reports that he quit smoking about 47 years ago. His smoking use included cigarettes. He has a 1.50 pack-year smoking history. He has never used smokeless tobacco. He reports that he does not drink alcohol or use drugs.  ROS  He is still on captopril for hypertension  General Examination:   BP 110/78 (BP Location: Left Arm, Patient Position: Standing, Cuff Size: Normal)   Pulse (!) 58   Ht 6' 0.25" (1.835 m)   Wt 191 lb 12.8 oz (87 kg)   SpO2 95%   BMI 25.83 kg/m     Assessment/ Plan:  PROLACTINOMA: He had a 1.4 cm pituitary tumor at baseline and his initial prolactin level was only moderately increased at 99  His baseline symptoms were mostly related to decreased libido  Has been well controlled with low doses of bromocriptine, has been on treatment for several years now  Currently on the bromocriptine 1.25 mg 4 times a week the prolactin is back down to normal, previously was 15-16 readings consistently  Subjectively feeling well and has no headaches or decreased libido His testosterone level and free T4 are excellent also   Recommendations: Continue bromocriptine half tablet 4 days a week which he is tolerating well  Follow-up in 6 months  Raedyn Wenke 12/22/2017, 9:52 AM

## 2018-03-06 DIAGNOSIS — Z23 Encounter for immunization: Secondary | ICD-10-CM | POA: Diagnosis not present

## 2018-04-12 ENCOUNTER — Other Ambulatory Visit: Payer: Self-pay | Admitting: Endocrinology

## 2018-06-20 DIAGNOSIS — R509 Fever, unspecified: Secondary | ICD-10-CM | POA: Diagnosis not present

## 2018-06-20 DIAGNOSIS — J209 Acute bronchitis, unspecified: Secondary | ICD-10-CM | POA: Diagnosis not present

## 2018-06-25 ENCOUNTER — Other Ambulatory Visit (INDEPENDENT_AMBULATORY_CARE_PROVIDER_SITE_OTHER): Payer: Medicare Other

## 2018-06-25 DIAGNOSIS — D352 Benign neoplasm of pituitary gland: Secondary | ICD-10-CM

## 2018-06-26 LAB — PROLACTIN: Prolactin: 10.7 ng/mL (ref 4.0–15.2)

## 2018-06-28 ENCOUNTER — Ambulatory Visit: Payer: Medicare Other | Admitting: Endocrinology

## 2018-06-28 DIAGNOSIS — Z0289 Encounter for other administrative examinations: Secondary | ICD-10-CM

## 2018-06-29 ENCOUNTER — Telehealth: Payer: Self-pay | Admitting: Endocrinology

## 2018-06-29 NOTE — Telephone Encounter (Signed)
Patient no showed today's appt. Please advise on how to follow up. °A. No follow up necessary. °B. Follow up urgent. Contact patient immediately. °C. Follow up necessary. Contact patient and schedule visit in ___ days. °D. Follow up advised. Contact patient and schedule visit in ____weeks. ° °Would you like the NS fee to be applied to this visit? ° °

## 2018-07-01 NOTE — Telephone Encounter (Signed)
Does not need to come in, let him know prolactin level is normal, please schedule follow-up visit in 6 months with labs

## 2018-07-02 ENCOUNTER — Encounter (HOSPITAL_COMMUNITY): Payer: Self-pay | Admitting: Emergency Medicine

## 2018-07-02 ENCOUNTER — Inpatient Hospital Stay (HOSPITAL_COMMUNITY)
Admission: EM | Admit: 2018-07-02 | Discharge: 2018-07-04 | DRG: 195 | Disposition: A | Discharge status: Home / Self Care | Payer: Medicare Other | Attending: Internal Medicine | Admitting: Internal Medicine

## 2018-07-02 ENCOUNTER — Other Ambulatory Visit: Payer: Self-pay

## 2018-07-02 ENCOUNTER — Ambulatory Visit: Payer: Medicare Other | Admitting: Endocrinology

## 2018-07-02 ENCOUNTER — Other Ambulatory Visit: Payer: Self-pay | Admitting: Internal Medicine

## 2018-07-02 ENCOUNTER — Ambulatory Visit
Admission: RE | Admit: 2018-07-02 | Discharge: 2018-07-02 | Disposition: A | Discharge status: Home / Self Care | Payer: Medicare Other | Source: Ambulatory Visit | Attending: Internal Medicine | Admitting: Internal Medicine

## 2018-07-02 DIAGNOSIS — Z8249 Family history of ischemic heart disease and other diseases of the circulatory system: Secondary | ICD-10-CM | POA: Diagnosis not present

## 2018-07-02 DIAGNOSIS — D352 Benign neoplasm of pituitary gland: Secondary | ICD-10-CM | POA: Diagnosis present

## 2018-07-02 DIAGNOSIS — Z8673 Personal history of transient ischemic attack (TIA), and cerebral infarction without residual deficits: Secondary | ICD-10-CM | POA: Diagnosis not present

## 2018-07-02 DIAGNOSIS — I10 Essential (primary) hypertension: Secondary | ICD-10-CM | POA: Diagnosis present

## 2018-07-02 DIAGNOSIS — Z91018 Allergy to other foods: Secondary | ICD-10-CM | POA: Diagnosis not present

## 2018-07-02 DIAGNOSIS — H409 Unspecified glaucoma: Secondary | ICD-10-CM | POA: Diagnosis present

## 2018-07-02 DIAGNOSIS — J40 Bronchitis, not specified as acute or chronic: Secondary | ICD-10-CM

## 2018-07-02 DIAGNOSIS — I252 Old myocardial infarction: Secondary | ICD-10-CM

## 2018-07-02 DIAGNOSIS — Z87891 Personal history of nicotine dependence: Secondary | ICD-10-CM | POA: Diagnosis not present

## 2018-07-02 DIAGNOSIS — R05 Cough: Secondary | ICD-10-CM | POA: Diagnosis not present

## 2018-07-02 DIAGNOSIS — R55 Syncope and collapse: Secondary | ICD-10-CM | POA: Diagnosis not present

## 2018-07-02 DIAGNOSIS — Z7901 Long term (current) use of anticoagulants: Secondary | ICD-10-CM | POA: Diagnosis not present

## 2018-07-02 DIAGNOSIS — Z8042 Family history of malignant neoplasm of prostate: Secondary | ICD-10-CM

## 2018-07-02 DIAGNOSIS — R509 Fever, unspecified: Secondary | ICD-10-CM | POA: Diagnosis not present

## 2018-07-02 DIAGNOSIS — Z841 Family history of disorders of kidney and ureter: Secondary | ICD-10-CM

## 2018-07-02 DIAGNOSIS — R0902 Hypoxemia: Secondary | ICD-10-CM | POA: Diagnosis not present

## 2018-07-02 DIAGNOSIS — J189 Pneumonia, unspecified organism: Principal | ICD-10-CM | POA: Diagnosis present

## 2018-07-02 DIAGNOSIS — Z86711 Personal history of pulmonary embolism: Secondary | ICD-10-CM | POA: Diagnosis not present

## 2018-07-02 DIAGNOSIS — Z86718 Personal history of other venous thrombosis and embolism: Secondary | ICD-10-CM

## 2018-07-02 DIAGNOSIS — R791 Abnormal coagulation profile: Secondary | ICD-10-CM | POA: Diagnosis present

## 2018-07-02 DIAGNOSIS — Z79899 Other long term (current) drug therapy: Secondary | ICD-10-CM | POA: Diagnosis not present

## 2018-07-02 DIAGNOSIS — Z791 Long term (current) use of non-steroidal anti-inflammatories (NSAID): Secondary | ICD-10-CM | POA: Diagnosis not present

## 2018-07-02 LAB — CBC WITH DIFFERENTIAL/PLATELET
Abs Immature Granulocytes: 0.08 10*3/uL — ABNORMAL HIGH (ref 0.00–0.07)
Basophils Absolute: 0.1 10*3/uL (ref 0.0–0.1)
Basophils Relative: 0 %
Eosinophils Absolute: 0.1 10*3/uL (ref 0.0–0.5)
Eosinophils Relative: 1 %
HCT: 40.3 % (ref 39.0–52.0)
Hemoglobin: 14 g/dL (ref 13.0–17.0)
Immature Granulocytes: 1 %
LYMPHS PCT: 4 %
Lymphs Abs: 0.6 10*3/uL — ABNORMAL LOW (ref 0.7–4.0)
MCH: 34.2 pg — AB (ref 26.0–34.0)
MCHC: 34.7 g/dL (ref 30.0–36.0)
MCV: 98.5 fL (ref 80.0–100.0)
MONO ABS: 0.9 10*3/uL (ref 0.1–1.0)
Monocytes Relative: 7 %
Neutro Abs: 12.4 10*3/uL — ABNORMAL HIGH (ref 1.7–7.7)
Neutrophils Relative %: 87 %
Platelets: 256 10*3/uL (ref 150–400)
RBC: 4.09 MIL/uL — ABNORMAL LOW (ref 4.22–5.81)
RDW: 11.9 % (ref 11.5–15.5)
WBC: 14.2 10*3/uL — ABNORMAL HIGH (ref 4.0–10.5)
nRBC: 0 % (ref 0.0–0.2)

## 2018-07-02 LAB — COMPREHENSIVE METABOLIC PANEL
ALT: 29 U/L (ref 0–44)
AST: 35 U/L (ref 15–41)
Albumin: 3.2 g/dL — ABNORMAL LOW (ref 3.5–5.0)
Alkaline Phosphatase: 80 U/L (ref 38–126)
Anion gap: 11 (ref 5–15)
BILIRUBIN TOTAL: 0.9 mg/dL (ref 0.3–1.2)
BUN: 17 mg/dL (ref 8–23)
CO2: 25 mmol/L (ref 22–32)
Calcium: 8.8 mg/dL — ABNORMAL LOW (ref 8.9–10.3)
Chloride: 104 mmol/L (ref 98–111)
Creatinine, Ser: 0.97 mg/dL (ref 0.61–1.24)
GFR calc Af Amer: >60 mL/min (ref >60)
Glucose, Bld: 108 mg/dL — ABNORMAL HIGH (ref 70–99)
Potassium: 3.8 mmol/L (ref 3.5–5.1)
Sodium: 140 mmol/L (ref 135–145)
Total Protein: 7.1 g/dL (ref 6.5–8.1)

## 2018-07-02 LAB — INFLUENZA PANEL BY PCR (TYPE A & B)
INFLAPCR: NEGATIVE
Influenza B By PCR: NEGATIVE

## 2018-07-02 LAB — PROTIME-INR
INR: 4.38
Prothrombin Time: 41.2 seconds — ABNORMAL HIGH (ref 11.4–15.2)

## 2018-07-02 LAB — I-STAT CG4 LACTIC ACID, ED: LACTIC ACID, VENOUS: 1.32 mmol/L (ref 0.5–1.9)

## 2018-07-02 MED ORDER — METOPROLOL SUCCINATE ER 25 MG PO TB24
25.0000 mg | ORAL_TABLET | Freq: Every day | ORAL | Status: DC
  Administered 2018-07-02 – 2018-07-04 (×3): 25 mg via ORAL
  Filled 2018-07-02 (×3): qty 1

## 2018-07-02 MED ORDER — LATANOPROST 0.005 % OP SOLN
1.0000 [drp] | Freq: Every day | OPHTHALMIC | Status: DC
  Administered 2018-07-02 – 2018-07-03 (×2): 1 [drp] via OPHTHALMIC
  Filled 2018-07-02: qty 2.5

## 2018-07-02 MED ORDER — GUAIFENESIN ER 600 MG PO TB12
600.0000 mg | ORAL_TABLET | Freq: Two times a day (BID) | ORAL | Status: DC
  Administered 2018-07-02 – 2018-07-04 (×5): 600 mg via ORAL
  Filled 2018-07-02 (×5): qty 1

## 2018-07-02 MED ORDER — SODIUM CHLORIDE 0.9 % IV SOLN
INTRAVENOUS | Status: DC
  Administered 2018-07-02 – 2018-07-03 (×2): via INTRAVENOUS

## 2018-07-02 MED ORDER — WARFARIN - PHARMACIST DOSING INPATIENT: Freq: Every day | Status: DC

## 2018-07-02 MED ORDER — SODIUM CHLORIDE 0.9 % IV SOLN
1.0000 g | Freq: Once | INTRAVENOUS | Status: AC
  Administered 2018-07-02: 1 g via INTRAVENOUS
  Filled 2018-07-02: qty 10

## 2018-07-02 MED ORDER — PNEUMOCOCCAL VAC POLYVALENT 25 MCG/0.5ML IJ INJ
0.5000 mL | INJECTION | INTRAMUSCULAR | Status: DC
  Filled 2018-07-02: qty 0.5

## 2018-07-02 MED ORDER — IPRATROPIUM-ALBUTEROL 0.5-2.5 (3) MG/3ML IN SOLN
3.0000 mL | Freq: Four times a day (QID) | RESPIRATORY_TRACT | Status: DC
  Administered 2018-07-02 (×2): 3 mL via RESPIRATORY_TRACT
  Filled 2018-07-02 (×2): qty 3

## 2018-07-02 MED ORDER — SODIUM CHLORIDE 0.9 % IV SOLN
1.0000 g | INTRAVENOUS | Status: DC
  Administered 2018-07-03: 1 g via INTRAVENOUS
  Filled 2018-07-02 (×2): qty 10

## 2018-07-02 MED ORDER — SODIUM CHLORIDE 0.9 % IV SOLN
500.0000 mg | Freq: Once | INTRAVENOUS | Status: AC
  Administered 2018-07-02: 500 mg via INTRAVENOUS
  Filled 2018-07-02: qty 500

## 2018-07-02 MED ORDER — SODIUM CHLORIDE 0.9 % IV BOLUS
1000.0000 mL | Freq: Once | INTRAVENOUS | Status: AC
  Administered 2018-07-02: 1000 mL via INTRAVENOUS

## 2018-07-02 MED ORDER — SODIUM CHLORIDE 0.9 % IV SOLN
500.0000 mg | INTRAVENOUS | Status: DC
  Administered 2018-07-03: 500 mg via INTRAVENOUS
  Filled 2018-07-02 (×2): qty 500

## 2018-07-02 MED ORDER — ASPIRIN EC 81 MG PO TBEC
81.0000 mg | DELAYED_RELEASE_TABLET | Freq: Every day | ORAL | Status: DC
  Filled 2018-07-02 (×2): qty 1

## 2018-07-02 MED ORDER — BROMOCRIPTINE MESYLATE 2.5 MG PO TABS
1.2500 mg | ORAL_TABLET | ORAL | Status: DC
  Administered 2018-07-03: 1.25 mg via ORAL
  Filled 2018-07-02: qty 1

## 2018-07-02 MED ORDER — ACETAMINOPHEN 325 MG PO TABS
650.0000 mg | ORAL_TABLET | Freq: Once | ORAL | Status: AC
  Administered 2018-07-02: 650 mg via ORAL
  Filled 2018-07-02: qty 2

## 2018-07-02 MED ORDER — CAPTOPRIL 25 MG PO TABS
25.0000 mg | ORAL_TABLET | Freq: Every evening | ORAL | Status: DC
  Administered 2018-07-02 – 2018-07-03 (×2): 25 mg via ORAL
  Filled 2018-07-02 (×2): qty 1

## 2018-07-02 MED ORDER — IPRATROPIUM-ALBUTEROL 0.5-2.5 (3) MG/3ML IN SOLN
3.0000 mL | Freq: Three times a day (TID) | RESPIRATORY_TRACT | Status: DC
  Administered 2018-07-03 – 2018-07-04 (×4): 3 mL via RESPIRATORY_TRACT
  Filled 2018-07-02 (×4): qty 3

## 2018-07-02 MED ORDER — IPRATROPIUM-ALBUTEROL 0.5-2.5 (3) MG/3ML IN SOLN: 3.0000 mL | Freq: Four times a day (QID) | RESPIRATORY_TRACT | Status: DC

## 2018-07-02 NOTE — ED Provider Notes (Signed)
[facility] EMERGENCY DEPARTMENT Provider Note   CSN: [id number] Arrival date & time: 07/02/18  1316   History   Chief Complaint Chief Complaint  Patient presents with  . Pneumonia    HPI Jay Day is a 78 y.o. male with a past medical history significant for hypertension, MI, pituitary adenoma, PE, CVA who presents for evaluation of cough.  Patient states he was treated for an upper respiratory infection 3 weeks ago.  Patient states he saw his PCP and she gave him amoxicillin at that time.  Patient states he is continued to have a productive cough, productive of yellow and green sputum for 3 weeks.  Patient states he has been running fevers over the last 4 days. Temp max 104.0 at home.  Patient was seen by his PCP earlier today and had chest x-ray performed.  Chest x-ray showed bilateral lower lobe infiltrates.  PCP with concern for failed outpatient therapy.  Patient states "I just do not feel good."  Denies chills, chest pain, shortness of breath, nausea, vomiting, abdominal pain, diarrhea, dysuria.  Denies additional aggravating or alleviating factors.  Has been taking Tylenol intermittently for his fever.  Unknown last Tylenol PTA.  Admits to receiving his influenza vaccine.  History provided by patient.  No interpreter was used.  HPI  Past Medical History:  Diagnosis Date  . Asthma    hx of as child  . Bronchitis    as a child  . Cataract   . Chronic anticoagulation 06/11/2013  . Clotting disorder ([place])   . 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 Elevated PSA    being monitored by physician  . Glaucoma 10/13/2011  . 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 Headache disorder 02/24/2015   occ  . Hypertension    sees [name], primary   . MI (myocardial infarction) ([place]) not sure  when   scar tissue saw  . Peyronie's disease   . 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 Pituitary tumor    takes medication to manage  . Prostatitis   . Pulmonary embolism ([place]) yrs ago  . Rash    last 3 months chest and arms, saw md no tx given  . 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 Lacunar infarct, acute ([place]) 11/16/2015  . Elevated PSA 09/14/2015  . Cerebral infarction due to unspecified mechanism   . CVA (cerebral infarction) 09/13/2015  . 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 Abnormal stress test 05/06/2014  . Aortic atherosclerosis ([place]) 03/17/2014  . Coronary artery calcification 03/17/2014  . History of pulmonary embolism 03/17/2014  . Chronic anticoagulation 06/11/2013  . 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 Glaucoma 10/13/2011  . 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 Prolactinoma ([place]) 09/11/2011  . HTN (hypertension) 09/11/2011  . Pulmonary embolism ([place]) 09/16/2010    Past Surgical History:  Procedure Laterality Date  . COLONOSCOPY N/A 08/22/2016   Procedure: COLONOSCOPY;  Surgeon: Garlan Fair, MD;  Location: [facility];  Service: Endoscopy;  Laterality: N/A;  . colonscopy  2007  . EYE SURGERY     bilateral cataract surgery  . HERNIA REPAIR  1960  . INGUINAL HERNIA REPAIR  08/12/2011   Procedure: LAPAROSCOPIC INGUINAL  HERNIA;  Surgeon: [name], MD;  Location: [facility];  Service: General;  Laterality: Left;  . [place]   left   . LEFT HEART CATHETERIZATION WITH CORONARY ANGIOGRAM N/A 05/06/2014   Procedure: LEFT HEART CATHETERIZATION WITH CORONARY ANGIOGRAM;  Surgeon: [name], MD;  Location: [facility] CATH LAB;  Service: Cardiovascular;  Laterality: N/A;  . TONSILLECTOMY     as a child        Home Medications    Prior to Admission medications   Medication Sig Start Date End Date Taking? Authorizing Provider  B Complex-C (B-COMPLEX WITH  VITAMIN C) tablet Take 1 tablet by mouth daily.    [provider]  bromocriptine (PARLODEL) 2.5 MG tablet TAKE 1/2 TABLET BY MOUTH 4 TIMES A WEEK 04/12/18   Elayne Snare, MD  captopril (CAPOTEN) 25 MG tablet Take 25 mg by mouth every evening. Reported on 10/16/2015    [provider]  chlorpheniramine-HYDROcodone (TUSSIONEX) 10-8 MG/5ML SUER Take 5 mLs by mouth every 12 (twelve) hours as needed for cough. 08/07/16   [provider]  Cholecalciferol (VITAMIN D3) 5000 units CAPS Take 5,000 Units by mouth daily.    [provider]  Emollient (EUCERIN) lotion Apply topically as needed for dry skin.    [provider]  ibuprofen (ADVIL,MOTRIN) 200 MG tablet Take 400 mg by mouth daily as needed for headache or moderate pain.    [provider]  latanoprost (XALATAN) 0.005 % ophthalmic solution place 1 drop into both eyes at bedtime PLS. SCHEDULE AN APPOINTMENT BEFORE FUTURE REFILLS 03/30/15   [provider]  meclizine (ANTIVERT) 12.5 MG tablet Take 1 tablet (12.5 mg total) by mouth 3 (three) times daily as needed for dizziness. 06/18/16   Theodosia Quay, MD  Melatonin 3 MG TABS Take 3 mg by mouth at bedtime as needed (sleep). Reported on 10/16/2015    [provider]  metoprolol succinate (TOPROL-XL) 25 MG 24 hr tablet Take 1 tablet (25 mg total) by mouth daily. Patient taking differently: Take 12.5 mg by mouth every evening.  09/15/15   Hosie Poisson, MD  Pseudoeph-Doxylamine-DM-APAP (NYQUIL PO) Take 1 Dose by mouth daily as needed (sleep/congestion).    [provider]  warfarin (COUMADIN) 5 MG tablet Take 5 mg by mouth every evening. 7 days week Will stop prior to procedure    [provider]    Family History Family History  Problem Relation Age of Onset  . Pulmonary embolism Mother   . Kidney failure Mother   . Hypertension Mother   . Kidney Stones Mother   . Pulmonary embolism Father   . Prostate cancer Father     . Pulmonary embolism Maternal Grandmother   . Pulmonary embolism Maternal Grandfather   . Pulmonary embolism Paternal Grandmother   . Pulmonary embolism Paternal Grandfather     Social History Social History   Tobacco Use  . Smoking status: Former Smoker    Packs/day: 0.30    Years: 5.00    Pack years: 1.50    Types: Cigarettes    Last attempt to quit: 06/13/1970    Years since quitting: 48.0  . Smokeless tobacco: Never Used  Substance Use Topics  . Alcohol use: No    Alcohol/week: 0.0 standard drinks  . Drug use: No     Allergies   Gluten meal and Wheat bran   Review of Systems Review of Systems  Constitutional: Positive for fever.  HENT: Negative.  Negative for congestion.  Respiratory: Positive for cough. Negative for choking, chest tightness, shortness of breath, wheezing and stridor.   Cardiovascular: Negative.   Gastrointestinal: Negative.   Genitourinary: Negative.   Musculoskeletal: Negative.   Skin: Negative.   Neurological: Negative.   All other systems reviewed and are negative.    Physical Exam Updated Vital Signs BP 136/72   Pulse 78   Temp (!) 103.1 F (39.5 C)   Resp 14   Ht 6\' 1"  (1.854 m)   Wt 83.9 kg   SpO2 98%   BMI 24.41 kg/m   Physical Exam Vitals signs and nursing note reviewed.  Constitutional:      General: He is not in acute distress.    Appearance: He is well-developed. He is not ill-appearing, toxic-appearing or diaphoretic.     Comments: Sitting in bed drinking water on initial evaluation.  Does not appear in any acute distress.  HENT:     Head: Normocephalic and atraumatic.     Jaw: There is normal jaw occlusion.     Nose: Nose normal.     Right Sinus: No maxillary sinus tenderness or frontal sinus tenderness.     Left Sinus: No maxillary sinus tenderness or frontal sinus tenderness.     Mouth/Throat:     Mouth: Mucous membranes are moist.     Pharynx: Oropharynx is clear.     Comments: Posterior oropharynx clear.   Uvula midline without deviation.  No oropharyngeal lesions Eyes:     Pupils: Pupils are equal, round, and reactive to light.  Neck:     Musculoskeletal: Full passive range of motion without pain, normal range of motion and neck supple.     Trachea: Trachea and phonation normal.     Comments: No neck stiffness or neck rigidity. Cardiovascular:     Rate and Rhythm: Normal rate and regular rhythm.     Pulses: Normal pulses.     Heart sounds: Normal heart sounds. No murmur. No friction rub. No gallop.   Pulmonary:     Effort: Pulmonary effort is normal. No respiratory distress.     Breath sounds: Normal breath sounds and air entry.     Comments: Mild bilateral rhonchi. Able to speak in full sentences without difficulty.  No evidence of acute respiratory distress. Abdominal:     General: There is no distension.     Palpations: Abdomen is soft.     Comments: Soft, nontender without rebound or guarding.  Musculoskeletal: Normal range of motion.     Comments: Moves all extremities without difficulty.  Lymphadenopathy:     Comments: No cervical lymphadenopathy.  Skin:    General: Skin is warm and dry.     Comments: No rashes or lesion   Neurological:     Mental Status: He is alert.      ED Treatments / Results  Labs (all labs ordered are listed, but only abnormal results are displayed) Labs Reviewed  CBC WITH DIFFERENTIAL/PLATELET - Abnormal; Notable for the following components:      Result Value   WBC 14.2 (*)    RBC 4.09 (*)    MCH 34.2 (*)    Neutro Abs 12.4 (*)    Lymphs Abs 0.6 (*)    Abs Immature Granulocytes 0.08 (*)    All other components within normal limits  COMPREHENSIVE METABOLIC PANEL - Abnormal; Notable for the following components:   Glucose, Bld 108 (*)    Calcium 8.8 (*)    Albumin 3.2 (*)    All  other components within normal limits  URINE CULTURE  CULTURE, BLOOD (ROUTINE X 2)  CULTURE, BLOOD (ROUTINE X 2)  INFLUENZA PANEL BY PCR (TYPE A & B)    URINALYSIS, ROUTINE W REFLEX MICROSCOPIC  I-STAT CG4 LACTIC ACID, ED  I-STAT CG4 LACTIC ACID, ED    EKG None  Radiology Dg Chest 2 View  Result Date: 07/02/2018 CLINICAL DATA:  Cough and fever. EXAM: CHEST - 2 VIEW COMPARISON:  09/13/2015 FINDINGS: The cardiac silhouette, mediastinal and hilar contours are within normal limits and stable. Underlying bronchitic type lung changes with superimposed patchy infiltrates. There is also a small right pleural effusion. Bilateral nodular opacities in the lower chest most consistent with nipple shadows. The bony thorax is intact. IMPRESSION: Underlying chronic lung changes with superimposed bilateral infiltrates. Followup PA and lateral chest X-ray is recommended in 3-4 weeks following trial of antibiotic therapy to ensure resolution and exclude underlying malignancy. Electronically Signed   By: Marijo Sanes M.D.   On: 07/02/2018 11:46    Procedures Procedures (including critical care time)  Medications Ordered in ED Medications  azithromycin (ZITHROMAX) 500 mg in sodium chloride 0.9 % 250 mL IVPB (500 mg Intravenous New Bag/Given 07/02/18 1423)  sodium chloride 0.9 % bolus 1,000 mL (1,000 mLs Intravenous New Bag/Given 07/02/18 1417)  acetaminophen (TYLENOL) tablet 650 mg (650 mg Oral Given 07/02/18 1415)  cefTRIAXone (ROCEPHIN) 1 g in sodium chloride 0.9 % 100 mL IVPB (1 g Intravenous New Bag/Given 07/02/18 1417)     Initial Impression / Assessment and Plan / ED Course  I have reviewed the triage vital signs and the nursing notes.  Pertinent labs & imaging results that were available during my care of the patient were reviewed by me and considered in my medical decision making (see chart for details).  78 year old male who appears otherwise well presents for evaluation of fever and productive cough.  Patient is febrile at 103.1.  Productive cough x3 weeks.  Has been given amoxicillin by his PCP with continued symptoms.  Patient was sent from his  PCP to ED for further evaluation.  Lungs with mild rhonchi bilaterally.  Able speak in full sentences without difficulty.  Oxygen saturation 98% on room air with good waveform. No tachypnea or tachycardia.  Will obtain labs and reevaluate. Patient given Tylenol for fever.  1400: CBC with leukocytosis at 93.2, metabolic panel without any electrolyte, renal or liver abnormalities, lactic acid 1.32. Blood cultures were obtained as well as flu panel.  Discussed with my attending physician, Dr. Tyrone Nine given patient's febrile state and white blood count.  Patient does not appear septic.  Will hold off on calling code sepsis, per attending physician's advice.  Will consult with hospitalist for admission given failed outpatient therapy.  Patient has received fluids as well as IV antibiotics for CAP as well as blood cultures. Influenza negative.  1510: Consulted with Dr. Tawanna Solo, [facility] hospitalist, who agrees for admission of patient.  Patient was discussed with my attending physician, Dr. Tyrone Nine who agrees with above treatment, plan and disposition.    Final Clinical Impressions(s) / ED Diagnoses   Final diagnoses:  Community acquired pneumonia, unspecified laterality    ED Discharge Orders    None       Zakery Normington A, PA-C 07/02/18 [place], Bruce, DO 07/02/18 1541

## 2018-07-02 NOTE — Progress Notes (Signed)
CRITICAL VALUE ALERT  Critical Value:  INR 4.38    PT: 41.2  Date & Time Notied:  07/02/18 1850  Provider Notified: 07/02/18 1852 Adhikari paged

## 2018-07-02 NOTE — ED Triage Notes (Signed)
Per EMS pt come to [place] from [place] with what they state is possible puemonia.  He went to his PCP 1 month ago where he was seen and treated for an upper respiratory infection and finished all the antibiotics and eventually got worse.  He went back today and an xray showed bilateral infiltrates.  NAD at this time AOx4

## 2018-07-02 NOTE — H&P (Signed)
History and Physical    Jay Day LJQ:[id number] DOB: 01-06-1941 DOA: 07/02/2018  PCP: Leeroy Cha, MD   Patient coming from: Home    Chief Complaint: Shortness of breath  HPI: Jay Day is a 78 y.o. 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  Patient had been having productive cough with yellow-greenish sputum since last 3 weeks.  He was seen by his PCP as an outpatient and was treated with amoxicillin but he continued to have cough and shortness of breath.  Since last 4 days patient was having fever at home.  Maximum recorded temperature was 104.  Patient was taking Tylenol but it was not helping.  He also received flu vaccine earlier last year.  He said he had a family gathering Christmas and at the time his son had a fever of 16 F .He was seen by his PCP earlier today and underwent chest x-ray.  Chest x-ray showed bilateral lower lobe pneumonia.  PCP sent him to the emergency department for failed outpatient therapy. When patient presented to the emergency department,  he was found to have a fever of 103 degree Fahrenheit.Chest x-ray done in the ED showed underlying chronic lung changes with superimposed bilateral infiltrates.  Patient was saturating fine on room air. Patient seen and examined the bedside in the emergency department.  Currently he looks comfortable.  He is not in acute respiratory distress.  He denies any chest pain, abdominal pain, dysuria, nausea, vomiting, headache, diarrhea.  ED Course: Chest x-ray was done which showed pneumonia.  Started on IV antibiotics.  Blood culture sent.  Influenza panel came out to be negative.     Past Medical History:  Diagnosis Date  . Asthma    hx of as child  . Bronchitis    as a child  . Cataract   . Chronic anticoagulation 06/11/2013  . Clotting disorder ([place])     . 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 Elevated PSA    being monitored by physician  . Glaucoma 10/13/2011  . 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 Headache disorder 02/24/2015   occ  . Hypertension    sees [name], primary   . MI (myocardial infarction) ([place]) not sure when   scar tissue saw  . Peyronie's disease   . Pituitary adenoma ([place]) 10/13/2011  . Pituitary tumor    takes medication to manage  . Prostatitis   . Pulmonary embolism ([place]) yrs ago  . Rash    last 3 months chest and arms, saw md no tx given  . Stroke [facility]) 2017   mild cva    Past Surgical History:  Procedure Laterality Date  . COLONOSCOPY N/A 08/22/2016   Procedure: COLONOSCOPY;  Surgeon: Garlan Fair, MD;  Location: [facility] ENDOSCOPY;  Service: Endoscopy;  Laterality: N/A;  . colonscopy  2007  . EYE SURGERY     bilateral cataract surgery  . HERNIA REPAIR  1960  . INGUINAL HERNIA REPAIR  08/12/2011   Procedure: LAPAROSCOPIC INGUINAL HERNIA;  Surgeon: [name], MD;  Location: [facility];  Service: General;  Laterality: Left;  . [place]   left   . LEFT HEART CATHETERIZATION WITH CORONARY  ANGIOGRAM N/A 05/06/2014   Procedure: LEFT HEART CATHETERIZATION WITH CORONARY ANGIOGRAM;  Surgeon: [name], MD;  Location: [facility] CATH LAB;  Service: Cardiovascular;  Laterality: N/A;  . TONSILLECTOMY     as a child     reports that he quit smoking about 48 years ago. His smoking use included cigarettes. He has a 1.50 pack-year smoking history. He has never used smokeless tobacco. He reports that he does not drink alcohol or use drugs.  Allergies  Allergen Reactions  . Gluten Meal Other (See Comments)    Gluten Sensitivity (reaction undefined) NO BREAD  . Wheat Bran Other (See Comments)    Reaction undefined    Family History   Problem Relation Age of Onset  . Pulmonary embolism Mother   . Kidney failure Mother   . Hypertension Mother   . Kidney Stones Mother   . Pulmonary embolism Father   . Prostate cancer Father   . Pulmonary embolism Maternal Grandmother   . Pulmonary embolism Maternal Grandfather   . Pulmonary embolism Paternal Grandmother   . Pulmonary embolism Paternal Grandfather      Prior to Admission medications   Medication Sig Start Date End Date Taking? Authorizing Provider  B Complex-C (B-COMPLEX WITH VITAMIN C) tablet Take 1 tablet by mouth daily.    [provider]  bromocriptine (PARLODEL) 2.5 MG tablet TAKE 1/2 TABLET BY MOUTH 4 TIMES A WEEK 04/12/18   Elayne Snare, MD  captopril (CAPOTEN) 25 MG tablet Take 25 mg by mouth every evening. Reported on 10/16/2015    [provider]  chlorpheniramine-HYDROcodone (TUSSIONEX) 10-8 MG/5ML SUER Take 5 mLs by mouth every 12 (twelve) hours as needed for cough. 08/07/16   [provider]  Cholecalciferol (VITAMIN D3) 5000 units CAPS Take 5,000 Units by mouth daily.    [provider]  Emollient (EUCERIN) lotion Apply topically as needed for dry skin.    [provider]  ibuprofen (ADVIL,MOTRIN) 200 MG tablet Take 400 mg by mouth daily as needed for headache or moderate pain.    [provider]  latanoprost (XALATAN) 0.005 % ophthalmic solution place 1 drop into both eyes at bedtime PLS. SCHEDULE AN APPOINTMENT BEFORE FUTURE REFILLS 03/30/15   [provider]  meclizine (ANTIVERT) 12.5 MG tablet Take 1 tablet (12.5 mg total) by mouth 3 (three) times daily as needed for dizziness. 06/18/16   Theodosia Quay, MD  Melatonin 3 MG TABS Take 3 mg by mouth at bedtime as needed (sleep). Reported on 10/16/2015    [provider]  metoprolol succinate (TOPROL-XL) 25 MG 24 hr tablet Take 1 tablet (25 mg total) by mouth daily. Patient taking differently: Take 12.5 mg by mouth every evening.  09/15/15    Hosie Poisson, MD  Pseudoeph-Doxylamine-DM-APAP (NYQUIL PO) Take 1 Dose by mouth daily as needed (sleep/congestion).    [provider]  warfarin (COUMADIN) 5 MG tablet Take 5 mg by mouth every evening. 7 days week Will stop prior to procedure    [provider]    Physical Exam: Vitals:   07/02/18 1329 07/02/18 1331 07/02/18 1332  BP:  136/72   Pulse:  81 78  Resp:  14   Temp:   (!) 103.1 F (39.5 C)  SpO2:  94% 98%  Weight: 83.9 kg    Height: 6\' 1"  (1.854 m)      Constitutional: NAD, calm, comfortable Vitals:   07/02/18 1329 07/02/18 1331 07/02/18 1332  BP:  136/72   Pulse:  81  78  Resp:  14   Temp:   (!) 103.1 F (39.5 C)  SpO2:  94% 98%  Weight: 83.9 kg    Height: 6\' 1"  (1.854 m)     Eyes: PERRL, lids and conjunctivae normal ENMT: Mucous membranes are moist. Posterior pharynx clear of any exudate or lesions.Normal dentition.  Neck: normal, supple, no masses, no thyromegaly Respiratory: clear to auscultation bilaterally, no wheezing, no crackles. Normal respiratory effort. No accessory muscle use.  Cardiovascular: Regular rate and rhythm, no murmurs / rubs / gallops. No extremity edema. 2+ pedal pulses. No carotid bruits.  Abdomen: no tenderness, no masses palpated. No hepatosplenomegaly. Bowel sounds positive.  Musculoskeletal: no clubbing / cyanosis. No joint deformity upper and lower extremities. Good ROM, no contractures. Normal muscle tone.  Skin: no rashes, lesions, ulcers. No induration Neurologic: CN 2-12 grossly intact. Sensation intact, DTR normal. Strength 5/5 in all 4.  Psychiatric: Normal judgment and insight. Alert and oriented x 3. Normal mood.   Foley Catheter:None  Labs on Admission: I have personally reviewed following labs and imaging studies  CBC: Recent Labs  Lab 07/02/18 1356  WBC 14.2*  NEUTROABS 12.4*  HGB 14.0  HCT 40.3  MCV 98.5  PLT 397   Basic Metabolic Panel: Recent Labs  Lab 07/02/18 1356  NA 140  K 3.8   CL 104  CO2 25  GLUCOSE 108*  BUN 17  CREATININE 0.97  CALCIUM 8.8*   GFR: Estimated Creatinine Clearance: 74.4 mL/min (by C-G formula based on SCr of 0.97 mg/dL). Liver Function Tests: Recent Labs  Lab 07/02/18 1356  AST 35  ALT 29  ALKPHOS 80  BILITOT 0.9  PROT 7.1  ALBUMIN 3.2*   No results for input(s): LIPASE, AMYLASE in the last 168 hours. No results for input(s): AMMONIA in the last 168 hours. Coagulation Profile: No results for input(s): INR, PROTIME in the last 168 hours. Cardiac Enzymes: No results for input(s): CKTOTAL, CKMB, CKMBINDEX, TROPONINI in the last 168 hours. BNP (last 3 results) No results for input(s): PROBNP in the last 8760 hours. HbA1C: No results for input(s): HGBA1C in the last 72 hours. CBG: No results for input(s): GLUCAP in the last 168 hours. Lipid Profile: No results for input(s): CHOL, HDL, LDLCALC, TRIG, CHOLHDL, LDLDIRECT in the last 72 hours. Thyroid Function Tests: No results for input(s): TSH, T4TOTAL, FREET4, T3FREE, THYROIDAB in the last 72 hours. Anemia Panel: No results for input(s): VITAMINB12, FOLATE, FERRITIN, TIBC, IRON, RETICCTPCT in the last 72 hours. Urine analysis:    Component Value Date/Time   COLORURINE YELLOW 09/13/2015 1531   APPEARANCEUR CLEAR 09/13/2015 1531   LABSPEC 1.013 09/13/2015 1531   PHURINE 6.5 09/13/2015 1531   GLUCOSEU NEGATIVE 09/13/2015 1531   HGBUR TRACE (A) 09/13/2015 1531   BILIRUBINUR NEGATIVE 09/13/2015 1531   KETONESUR NEGATIVE 09/13/2015 1531   PROTEINUR NEGATIVE 09/13/2015 1531   NITRITE NEGATIVE 09/13/2015 1531   LEUKOCYTESUR NEGATIVE 09/13/2015 1531    Radiological Exams on Admission: Dg Chest 2 View  Result Date: 07/02/2018 CLINICAL DATA:  Cough and fever. EXAM: CHEST - 2 VIEW COMPARISON:  09/13/2015 FINDINGS: The cardiac silhouette, mediastinal and hilar contours are within normal limits and stable. Underlying bronchitic type lung changes with superimposed patchy infiltrates.  There is also a small right pleural effusion. Bilateral nodular opacities in the lower chest most consistent with nipple shadows. The bony thorax is intact. IMPRESSION: Underlying chronic lung changes with superimposed bilateral infiltrates. Followup PA and lateral chest X-ray is recommended in  3-4 weeks following trial of antibiotic therapy to ensure resolution and exclude underlying malignancy. Electronically Signed   By: Marijo Sanes M.D.   On: 07/02/2018 11:46     Assessment/Plan Principal Problem:   Community acquired pneumonia Active Problems:   Prolactinoma ([place])   HTN (hypertension)   Pituitary adenoma ([place])   Chronic anticoagulation   History of pulmonary embolism   CAP (community acquired pneumonia)  Community-acquired pneumonia: Chest x-ray showed multifocal pneumonia.Started on ceftriaxone and azithromycin to cover for community-acquired pneumonia.  He was treated with amoxicillin without improvement as an outpatient. Presented with fever.  Blood cultures have been sent.  We will follow-up on that. Will send sputum culture, Streptococcus/Legionella antigen. Bronchodilators as needed .Continue Mucinex. Flu panel negative.  Chronic changes on the chest x-ray: Previous smoker.  Also reported to have frequent episodes of bronchitis in his younger days.  But he did not  have any issues with his lungs before this episode.  Hypertension: Currently blood pressure stable.  Will resume his home medications.  History of prolactinoma: On bromocriptine which we will continue.  Follows with endocrinologist as an outpatient.  History of PE/DVT: On anticoagulation with warfarin at home.  We will continue to monitor daily INR.  Resume warfarin here.  Patient's home medications should to be reconciled.  I have requested pharmacy for reconciliation of home medications .After full reconciliation, his  home medications will be started.  Severity of Illness: The appropriate patient status for  this patient is INPATIENT.  He failed outpatient antibiotic therapy so he needs IV antibiotics.  Also presented with fever of 103 F.  DVT prophylaxis: Warfarin Code Status: Full Family Communication: None present at the bedside Consults called: None     Shelly Coss MD [facility] Pager [phone]  If 7PM-7AM, please contact night-coverage [email or web address] Password [facility]  07/02/2018, 3:27 PM

## 2018-07-02 NOTE — Progress Notes (Signed)
ANTICOAGULATION CONSULT NOTE - Initial Consult  Pharmacy Consult for Coumadin Indication: h/o PE/DVT  Allergies  Allergen Reactions  . Gluten Meal Other (See Comments)    Gluten Sensitivity (reaction undefined) NO BREAD  . Wheat Bran Other (See Comments)    Reaction undefined    Patient Measurements: Height: 6\' 1"  (185.4 cm) Weight: 185 lb (83.9 kg) IBW/kg (Calculated) : 79.9  Vital Signs: Temp: 103.1 F (39.5 C) (01/20 1332) BP: 121/79 (01/20 1545) Pulse Rate: 77 (01/20 1545)  Labs: Recent Labs    07/02/18 1356  HGB 14.0  HCT 40.3  PLT 256  CREATININE 0.97    Estimated Creatinine Clearance: 74.4 mL/min (by C-G formula based on SCr of 0.97 mg/dL).   Medical History: Past Medical History:  Diagnosis Date  . Asthma    hx of as child  . Bronchitis    as a child  . Cataract   . Chronic anticoagulation 06/11/2013  . Clotting disorder ([place])   . 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 Elevated PSA    being monitored by physician  . Glaucoma 10/13/2011  . 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 Headache disorder 02/24/2015   occ  . Hypertension    sees [name], primary   . MI (myocardial infarction) ([place]) not sure when   scar tissue saw  . Peyronie's disease   . Pituitary adenoma ([place]) 10/13/2011  . Pituitary tumor    takes medication to manage  . Prostatitis   . Pulmonary embolism ([place]) yrs ago  . Rash    last 3 months chest and arms, saw md no tx given  . Stroke [facility]) 2017   mild cva    Medications:  See electronic home med rec  Assessment: 78 y.o. M presents with SOB. Pt on coumadin PTA for h/o DVT/PE. Admission INR supratherapeutic 4.38. CBC ok on admission Home coumadin dose: 5mg  daily - last taken 1/19  Goal of Therapy:  INR 2-3 Monitor platelets by anticoagulation  protocol: Yes   Plan:  Daily INR No coumadin tonight  Sherlon Handing, PharmD, BCPS Clinical pharmacist  **Pharmacist phone directory can now be found on [email or web address] ([email or web address]).  Listed under [place]. 07/02/2018,4:16 PM

## 2018-07-03 LAB — BASIC METABOLIC PANEL WITH GFR
Anion gap: 8 (ref 5–15)
BUN: 17 mg/dL (ref 8–23)
CO2: 23 mmol/L (ref 22–32)
Calcium: 8.4 mg/dL — ABNORMAL LOW (ref 8.9–10.3)
Chloride: 109 mmol/L (ref 98–111)
Creatinine, Ser: 0.83 mg/dL (ref 0.61–1.24)
GFR calc Af Amer: >60 mL/min
GFR calc non Af Amer: >60 mL/min
Glucose, Bld: 89 mg/dL (ref 70–99)
Potassium: 3.5 mmol/L (ref 3.5–5.1)
Sodium: 140 mmol/L (ref 135–145)

## 2018-07-03 LAB — CBC
HCT: 37.8 % — ABNORMAL LOW (ref 39.0–52.0)
Hemoglobin: 12.7 g/dL — ABNORMAL LOW (ref 13.0–17.0)
MCH: 33.1 pg (ref 26.0–34.0)
MCHC: 33.6 g/dL (ref 30.0–36.0)
MCV: 98.4 fL (ref 80.0–100.0)
Platelets: 229 10*3/uL (ref 150–400)
RBC: 3.84 MIL/uL — ABNORMAL LOW (ref 4.22–5.81)
RDW: 11.9 % (ref 11.5–15.5)
WBC: 13.3 10*3/uL — ABNORMAL HIGH (ref 4.0–10.5)
nRBC: 0 % (ref 0.0–0.2)

## 2018-07-03 LAB — URINALYSIS, ROUTINE W REFLEX MICROSCOPIC
Bilirubin Urine: NEGATIVE
Glucose, UA: NEGATIVE mg/dL
Hgb urine dipstick: NEGATIVE
Ketones, ur: NEGATIVE mg/dL
Leukocytes, UA: NEGATIVE
NITRITE: NEGATIVE
Protein, ur: NEGATIVE mg/dL
Specific Gravity, Urine: 1.018 (ref 1.005–1.030)
pH: 5 (ref 5.0–8.0)

## 2018-07-03 LAB — PROTIME-INR
INR: 4.73
Prothrombin Time: 43.7 seconds — ABNORMAL HIGH (ref 11.4–15.2)

## 2018-07-03 LAB — STREP PNEUMONIAE URINARY ANTIGEN: Strep Pneumo Urinary Antigen: NEGATIVE

## 2018-07-03 MED ORDER — GUAIFENESIN-DM 100-10 MG/5ML PO SYRP
5.0000 mL | ORAL_SOLUTION | ORAL | Status: DC | PRN
  Administered 2018-07-03: 5 mL via ORAL
  Filled 2018-07-03: qty 5

## 2018-07-03 NOTE — Progress Notes (Signed)
ANTICOAGULATION CONSULT NOTE - Initial Consult  Pharmacy Consult for Coumadin Indication: h/o PE/DVT  Allergies  Allergen Reactions  . Gluten Meal Other (See Comments)    Gluten Sensitivity (reaction undefined) NO BREAD  . Wheat Bran Other (See Comments)    Reaction undefined    Patient Measurements: Height: 6\' 1"  (185.4 cm) Weight: 185 lb (83.9 kg) IBW/kg (Calculated) : 79.9  Vital Signs: Temp: 103.1 F (39.5 C) (01/21 0744) Temp Source: Oral (01/21 0744) BP: 135/72 (01/21 0744) Pulse Rate: 85 (01/21 0744)  Labs: Recent Labs    07/02/18 1356 07/02/18 1809 07/03/18 0346  HGB 14.0  --  12.7*  HCT 40.3  --  37.8*  PLT 256  --  229  LABPROT  --  41.2* 43.7*  INR  --  4.38* 4.73*  CREATININE 0.97  --  0.83    Estimated Creatinine Clearance: 86.9 mL/min (by C-G formula based on SCr of 0.83 mg/dL).   Medical History: Past Medical History:  Diagnosis Date  . Asthma    hx of as child  . Bronchitis    as a child  . Cataract   . Chronic anticoagulation 06/11/2013  . Clotting disorder ([place])   . 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 Elevated PSA    being monitored by physician  . Glaucoma 10/13/2011  . 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 Headache disorder 02/24/2015   occ  . Hypertension    sees [name], primary   . MI (myocardial infarction) ([place]) not sure when   scar tissue saw  . Peyronie's disease   . Pituitary adenoma ([place]) 10/13/2011  . Pituitary tumor    takes medication to manage  . Prostatitis   . Pulmonary embolism ([place]) yrs ago  . Rash    last 3 months chest and arms, saw md no tx given  . Stroke [facility]) 2017   mild cva    Medications:  See electronic home med rec  Assessment: 78 y.o. M presents with SOB. Pt on coumadin PTA for h/o DVT/PE. Admission INR  supratherapeutic 4.38. CBC ok on admission Home coumadin dose: 5mg  daily - last taken 1/19  INR remains supratherapeutic at 4.73  Goal of Therapy:  INR 2-3 Monitor platelets by anticoagulation protocol: Yes   Plan:  Hold coumadin again tonight Daily INR, s/s bleeding  Bertis Ruddy, PharmD Clinical Pharmacist Please check AMION for all [place] numbers 07/03/2018 7:52 AM

## 2018-07-03 NOTE — Progress Notes (Signed)
PROGRESS NOTE    Jay Day  AYT:[id number] DOB: [birth year] DOA: 07/02/2018 PCP: Leeroy Cha, MD   Brief Narrative: Jay Day is a 78 y.o. 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  Patient had been having productive cough with yellow-greenish sputum since last 3 weeks.  Failed outpatient antibiotic therapy.  Chest x-ray showed bilateral basilar pneumonia.  Started on IV antibiotics here.  Assessment & Plan:   Principal Problem:   Community acquired pneumonia Active Problems:   Prolactinoma ([place])   HTN (hypertension)   Pituitary adenoma ([place])   Chronic anticoagulation   History of pulmonary embolism   CAP (community acquired pneumonia)   Community-acquired pneumonia: Chest x-ray showed multifocal pneumonia.Started on ceftriaxone and azithromycin to cover for community-acquired pneumonia.  He was treated with amoxicillin without improvement as an outpatient. Presented with fever.  Blood cultures have been sent.  We will follow-up on that. We will follow up  sputum culture, Streptococcus antigen negative,follow up Legionella antigen. Bronchodilators as needed .Continue Mucinex. Flu panel negative.  Chronic changes on the chest x-ray: Previous smoker.  Also reported to have frequent episodes of bronchitis in his younger days.  But he did not  have any issues with his lungs before this episode.  Hypertension: Currently blood pressure stable.  Will resume his home medications.  History of prolactinoma: On bromocriptine which we will continue.  Follows with endocrinologist as an outpatient.  History of PE/DVT: On anticoagulation with warfarin at home.  We will continue to monitor daily INR.         DVT prophylaxis:Supratherapeutic INR Code Status: Full Family Communication: Wife present at the bed  side Disposition Plan: Home in 1-2 days   Consultants: None  Procedures:None  Antimicrobials:  Anti-infectives (From admission, onward)   Start     Dose/Rate Route Frequency Ordered Stop   07/03/18 1500  azithromycin (ZITHROMAX) 500 mg in sodium chloride 0.9 % 250 mL IVPB     500 mg 250 mL/hr over 60 Minutes Intravenous Every 24 hours 07/02/18 1538     07/03/18 1400  cefTRIAXone (ROCEPHIN) 1 g in sodium chloride 0.9 % 100 mL IVPB     1 g 200 mL/hr over 30 Minutes Intravenous Every 24 hours 07/02/18 1538     07/02/18 1400  cefTRIAXone (ROCEPHIN) 1 g in sodium chloride 0.9 % 100 mL IVPB     1 g 200 mL/hr over 30 Minutes Intravenous  Once 07/02/18 1350 07/02/18 1447   07/02/18 1400  azithromycin (ZITHROMAX) 500 mg in sodium chloride 0.9 % 250 mL IVPB     500 mg 250 mL/hr over 60 Minutes Intravenous  Once 07/02/18 1350 07/02/18 1548      Subjective: Patient seen and examined the bedside this morning.  Recorded to have a temperature of 103 Fahrenheit this morning.  He looks comfortable.  Respiratory status stable.  Saturating fine on room air.  Ambulating  Objective: Vitals:   07/02/18 1714 07/02/18 2055 07/03/18 0744 07/03/18 0835  BP: 130/87  135/72   Pulse: 81  85   Resp: 20  16   Temp: 98.4 F (36.9 C)  (!) 103.1 F (39.5 C)   TempSrc: Oral  Oral   SpO2: 96% 96% 92% 94%  Weight:      Height:        Intake/Output Summary (Last 24 hours) at 07/03/2018 1107 Last data filed  at 07/03/2018 0300 Gross per 24 hour  Intake 1929.03 ml  Output -  Net 1929.03 ml   Filed Weights   07/02/18 1329  Weight: 83.9 kg    Examination:  General exam: Appears calm and comfortable ,Not in distress,average built HEENT:PERRL,Oral mucosa moist, Ear/Nose normal on gross exam Respiratory system: Bilateral decreased air entry on the bases Cardiovascular system: S1 & S2 heard, RRR. No JVD, murmurs, rubs, gallops or clicks. No pedal edema. Gastrointestinal system: Abdomen is nondistended,  soft and nontender. No organomegaly or masses felt. Normal bowel sounds heard. Central nervous system: Alert and oriented. No focal neurological deficits. Extremities: No edema, no clubbing ,no cyanosis, distal peripheral pulses palpable. Skin: No rashes, lesions or ulcers,no icterus ,no pallor MSK: Normal muscle bulk,tone ,power Psychiatry: Judgement and insight appear normal. Mood & affect appropriate.     Data Reviewed: I have personally reviewed following labs and imaging studies  CBC: Recent Labs  Lab 07/02/18 1356 07/03/18 0346  WBC 14.2* 13.3*  NEUTROABS 12.4*  --   HGB 14.0 12.7*  HCT 40.3 37.8*  MCV 98.5 98.4  PLT 256 301   Basic Metabolic Panel: Recent Labs  Lab 07/02/18 1356 07/03/18 0346  NA 140 140  K 3.8 3.5  CL 104 109  CO2 25 23  GLUCOSE 108* 89  BUN 17 17  CREATININE 0.97 0.83  CALCIUM 8.8* 8.4*   GFR: Estimated Creatinine Clearance: 86.9 mL/min (by C-G formula based on SCr of 0.83 mg/dL). Liver Function Tests: Recent Labs  Lab 07/02/18 1356  AST 35  ALT 29  ALKPHOS 80  BILITOT 0.9  PROT 7.1  ALBUMIN 3.2*   No results for input(s): LIPASE, AMYLASE in the last 168 hours. No results for input(s): AMMONIA in the last 168 hours. Coagulation Profile: Recent Labs  Lab 07/02/18 1809 07/03/18 0346  INR 4.38* 4.73*   Cardiac Enzymes: No results for input(s): CKTOTAL, CKMB, CKMBINDEX, TROPONINI in the last 168 hours. BNP (last 3 results) No results for input(s): PROBNP in the last 8760 hours. HbA1C: No results for input(s): HGBA1C in the last 72 hours. CBG: No results for input(s): GLUCAP in the last 168 hours. Lipid Profile: No results for input(s): CHOL, HDL, LDLCALC, TRIG, CHOLHDL, LDLDIRECT in the last 72 hours. Thyroid Function Tests: No results for input(s): TSH, T4TOTAL, FREET4, T3FREE, THYROIDAB in the last 72 hours. Anemia Panel: No results for input(s): VITAMINB12, FOLATE, FERRITIN, TIBC, IRON, RETICCTPCT in the last 72  hours. Sepsis Labs: Recent Labs  Lab 07/02/18 1415  LATICACIDVEN 1.32    No results found for this or any previous visit (from the past 240 hour(s)).       Radiology Studies: Dg Chest 2 View  Result Date: 07/02/2018 CLINICAL DATA:  Cough and fever. EXAM: CHEST - 2 VIEW COMPARISON:  09/13/2015 FINDINGS: The cardiac silhouette, mediastinal and hilar contours are within normal limits and stable. Underlying bronchitic type lung changes with superimposed patchy infiltrates. There is also a small right pleural effusion. Bilateral nodular opacities in the lower chest most consistent with nipple shadows. The bony thorax is intact. IMPRESSION: Underlying chronic lung changes with superimposed bilateral infiltrates. Followup PA and lateral chest X-ray is recommended in 3-4 weeks following trial of antibiotic therapy to ensure resolution and exclude underlying malignancy. Electronically Signed   By: Marijo Sanes M.D.   On: 07/02/2018 11:46        Scheduled Meds: . bromocriptine  1.25 mg Oral Once per day on Mon Tue Thu Fri  .  captopril  25 mg Oral QPM  . guaiFENesin  600 mg Oral BID  . ipratropium-albuterol  3 mL Nebulization TID  . latanoprost  1 drop Both Eyes QHS  . metoprolol succinate  25 mg Oral Daily  . pneumococcal 23 valent vaccine  0.5 mL Intramuscular Tomorrow-1000  . Warfarin - Pharmacist Dosing Inpatient   Does not apply q1800   Continuous Infusions: . sodium chloride 75 mL/hr at 07/03/18 0738  . azithromycin    . cefTRIAXone (ROCEPHIN)  IV       LOS: 1 day    Time spent:35 mins. More than 50% of that time was spent in counseling and/or coordination of care.      Shelly Coss, MD [facility] Pager [phone]  If 7PM-7AM, please contact night-coverage [email or web address] Password [email or web address] 07/03/2018, 11:07 AM

## 2018-07-03 NOTE — Progress Notes (Signed)
Pt. wanted IV fluids stopped- states he's drinking good and going to [place] a lot; RN stopped IVF upon pt.'s request; Schorr,NP notified.

## 2018-07-04 LAB — CBC WITH DIFFERENTIAL/PLATELET
Abs Immature Granulocytes: 0.06 10*3/uL (ref 0.00–0.07)
Basophils Absolute: 0 10*3/uL (ref 0.0–0.1)
Basophils Relative: 0 %
Eosinophils Absolute: 0.1 10*3/uL (ref 0.0–0.5)
Eosinophils Relative: 1 %
HEMATOCRIT: 34.7 % — AB (ref 39.0–52.0)
Hemoglobin: 11.8 g/dL — ABNORMAL LOW (ref 13.0–17.0)
Immature Granulocytes: 1 %
Lymphocytes Relative: 6 %
Lymphs Abs: 0.7 10*3/uL (ref 0.7–4.0)
MCH: 33.2 pg (ref 26.0–34.0)
MCHC: 34 g/dL (ref 30.0–36.0)
MCV: 97.7 fL (ref 80.0–100.0)
MONO ABS: 1.1 10*3/uL — AB (ref 0.1–1.0)
Monocytes Relative: 9 %
Neutro Abs: 10.7 10*3/uL — ABNORMAL HIGH (ref 1.7–7.7)
Neutrophils Relative %: 83 %
Platelets: 214 10*3/uL (ref 150–400)
RBC: 3.55 MIL/uL — ABNORMAL LOW (ref 4.22–5.81)
RDW: 11.9 % (ref 11.5–15.5)
WBC: 12.6 10*3/uL — ABNORMAL HIGH (ref 4.0–10.5)
nRBC: 0 % (ref 0.0–0.2)

## 2018-07-04 LAB — URINE CULTURE: Culture: <10000 — AB

## 2018-07-04 LAB — PROTIME-INR
INR: 3.56
PROTHROMBIN TIME: 35 s — AB (ref 11.4–15.2)

## 2018-07-04 LAB — LEGIONELLA PNEUMOPHILA SEROGP 1 UR AG: L. pneumophila Serogp 1 Ur Ag: NEGATIVE

## 2018-07-04 MED ORDER — GUAIFENESIN-DM 100-10 MG/5ML PO SYRP: 10.0000 mL | ORAL_SOLUTION | ORAL | 0 refills | Status: DC | PRN

## 2018-07-04 MED ORDER — ALBUTEROL SULFATE (2.5 MG/3ML) 0.083% IN NEBU: 2.5000 mg | INHALATION_SOLUTION | RESPIRATORY_TRACT | Status: DC | PRN

## 2018-07-04 MED ORDER — WARFARIN SODIUM 4 MG PO TABS: 4.0000 mg | ORAL_TABLET | Freq: Once | ORAL | Status: DC

## 2018-07-04 MED ORDER — WARFARIN SODIUM 5 MG PO TABS: ORAL_TABLET | ORAL | Status: DC

## 2018-07-04 MED ORDER — CEFDINIR 300 MG PO CAPS: 300.0000 mg | ORAL_CAPSULE | Freq: Two times a day (BID) | ORAL | 0 refills | Status: AC

## 2018-07-04 MED ORDER — AZITHROMYCIN 500 MG PO TABS: ORAL_TABLET | ORAL | 0 refills | Status: DC

## 2018-07-04 NOTE — Progress Notes (Addendum)
ANTICOAGULATION CONSULT NOTE - Initial Consult  Pharmacy Consult for Coumadin Indication: h/o PE/DVT  Allergies  Allergen Reactions  . Gluten Meal Other (See Comments)    Gluten Sensitivity (reaction undefined) NO BREAD  . Wheat Bran Other (See Comments)    Reaction undefined    Patient Measurements: Height: 6\' 1"  (185.4 cm) Weight: 185 lb (83.9 kg) IBW/kg (Calculated) : 79.9  Vital Signs: Temp: 98.1 F (36.7 C) (01/22 0000) Temp Source: Oral (01/22 0000) BP: 129/71 (01/22 0000) Pulse Rate: 71 (01/22 0000)  Labs: Recent Labs    07/02/18 1356 07/02/18 1809 07/03/18 0346 07/04/18 0612  HGB 14.0  --  12.7* 11.8*  HCT 40.3  --  37.8* 34.7*  PLT 256  --  229 214  LABPROT  --  41.2* 43.7* 35.0*  INR  --  4.38* 4.73* 3.56  CREATININE 0.97  --  0.83  --     Estimated Creatinine Clearance: 86.9 mL/min (by C-G formula based on SCr of 0.83 mg/dL).   Medical History: Past Medical History:  Diagnosis Date  . Asthma    hx of as child  . Bronchitis    as a child  . Cataract   . Chronic anticoagulation 06/11/2013  . Clotting disorder ([place])   . 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 Elevated PSA    being monitored by physician  . Glaucoma 10/13/2011  . 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 Headache disorder 02/24/2015   occ  . Hypertension    sees [name], primary   . MI (myocardial infarction) ([place]) not sure when   scar tissue saw  . Peyronie's disease   . 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 Pituitary tumor    takes medication to manage  . Prostatitis   . Pulmonary embolism ([place]) yrs ago  . Rash    last 3 months chest and arms, saw md no tx given  . Stroke [facility]) 2017   mild cva    Medications:  See electronic home med rec  Assessment: 78 y.o. M presents with SOB. Pt on coumadin  PTA for h/o DVT/PE. Admission INR supratherapeutic 4.38. CBC ok on admission Home coumadin dose: 5mg  daily - last taken 1/19  INR supratherapeutic at 3.56 significant downtrend from 4.73 yesterday s/p doses held, will restart.  Of note also on azithromycin for CAP tx.     Goal of Therapy:  INR 2-3 Monitor platelets by anticoagulation protocol: Yes   Plan:  Warfarin 4mg  PO x 1 tonight Daily INR, s/s bleeding  Bertis Ruddy, PharmD Clinical Pharmacist Please check AMION for all [place] numbers 07/04/2018 8:03 AM

## 2018-07-04 NOTE — Plan of Care (Signed)
  Problem: Education: Goal: Knowledge of General Education information will improve Description Including pain rating scale, medication(s)/side effects and non-pharmacologic comfort measures Outcome: Progressing Note:  POC reviewed with pt.   

## 2018-07-04 NOTE — Discharge Summary (Signed)
Physician Discharge Summary  Jay Day YKD:[id number] DOB: 11/09/1940 DOA: 07/02/2018  PCP: Leeroy Cha, MD  Admit date: 07/02/2018 Discharge date: 07/04/2018  Admitted From: Home Disposition:  Home  Discharge Condition:Stable CODE STATUS:FULL Diet recommendation: Heart Healthy   Brief/Interim Summary: Jay Day a 78 y.o.malewith medical history significant ofhypertension, pituitary adenoma, PE/DVT on warfarin, CVA who presented to the emergencydepartment for evaluation of cough and shortness of breath. Patient had been having productive cough with yellow-greenish sputum since last 3 weeks.  Failed outpatient antibiotic therapy.  Chest x-ray showed bilateral basilar pneumonia.  Started on IV antibiotics here. Patient's respiratory status has stabilized.  He is afebrile currently.  Cultures negative.  Antibiotics will be changed to oral.  He is stable for discharge to home today.  Following problems were addressed during his hospitalization:  Community-acquired pneumonia:Chest x-ray showed multifocal pneumonia.Started on ceftriaxone and azithromycin to cover for community-acquired pneumonia.He was treated with amoxicillin without improvement as an outpatient.  Antibotics changed to oral on discharge today. Presented with fever. Blood cultures ,  sputum culture sent .  No growth till date.Streptococcus antigen negative. Continue Mucinex. Flu panel negative.  Chronic changes on the chest x-ray: Previous smoker. Also reported to have frequent episodes of bronchitis in his younger days. But he did nothave anyissues with his lungs before this episode.  Hypertension: Currently blood pressure stable. Will resume his home medications.  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 Follows with endocrinologist as an outpatient.  History of PE/DVT:On anticoagulation with warfarin at home.   INR supratherapeutic on presentation.  Resume  warfarin from tomorrow.  Discharge Diagnoses:  Principal Problem:   Community acquired pneumonia Active Problems:   Prolactinoma ([place])   HTN (hypertension)   Pituitary adenoma ([place])   Chronic anticoagulation   History of pulmonary embolism   CAP (community acquired pneumonia)    Discharge Instructions  Discharge Instructions    Diet - low sodium heart healthy   Complete by:  As directed    Discharge instructions   Complete by:  As directed    1) Please follow up with your PCP in a week. 2) Take prescribed medications as instructed. 3) Continue monitoring your INR.  Start warfarin from tomorrow.   Increase activity slowly   Complete by:  As directed      Allergies as of 07/04/2018      Reactions   Gluten Meal Other (See Comments)   Gluten Sensitivity (reaction undefined) NO BREAD   Wheat Bran Other (See Comments)   Reaction undefined      Medication List    TAKE these medications   acetaminophen 500 MG tablet Commonly known as:  TYLENOL Take 500 mg by mouth every 6 (six) hours as needed for mild pain.   aspirin EC 81 MG tablet Take 81 mg by mouth daily.   azithromycin 500 MG tablet Commonly known as:  ZITHROMAX Take 1 tablet once   B-complex with vitamin C tablet Take 1 tablet by mouth daily.   bromocriptine 2.5 MG tablet Commonly known as:  PARLODEL TAKE 1/2 TABLET BY MOUTH 4 TIMES A WEEK What changed:  See the new instructions.   captopril 25 MG tablet Commonly known as:  CAPOTEN Take 25 mg by mouth every evening. Reported on 10/16/2015   cefdinir 300 MG capsule Commonly known as:  OMNICEF Take 1 capsule (300 mg total) by mouth 2 (two) times daily for 5 days.   eucerin lotion Apply topically as needed for dry skin.  fluticasone 50 MCG/ACT nasal spray Commonly known as:  FLONASE Place 1 spray into both nostrils daily.   guaiFENesin-dextromethorphan 100-10 MG/5ML syrup Commonly known as:  ROBITUSSIN DM Take 10 mLs by mouth every 4 (four)  hours as needed for cough.   ibuprofen 200 MG tablet Commonly known as:  ADVIL,MOTRIN Take 400 mg by mouth daily as needed for headache or moderate pain.   latanoprost 0.005 % ophthalmic solution Commonly known as:  XALATAN Place 1 drop into both eyes at bedtime.   meclizine 12.5 MG tablet Commonly known as:  ANTIVERT Take 1 tablet (12.5 mg total) by mouth 3 (three) times daily as needed for dizziness.   Melatonin 3 MG Tabs Take 3 mg by mouth at bedtime as needed (sleep).   metoprolol succinate 25 MG 24 hr tablet Commonly known as:  TOPROL-XL Take 1 tablet (25 mg total) by mouth daily. What changed:    how much to take  when to take this   naproxen sodium 220 MG tablet Commonly known as:  ALEVE Take 220 mg by mouth daily as needed (pain).   OVER THE COUNTER MEDICATION Take 2 tablets by mouth daily. Herbal defense complex   OVER THE COUNTER MEDICATION Take 10 mLs by mouth at bedtime as needed (cough). Cough and bronchial syrup   vitamin C 500 MG tablet Commonly known as:  ASCORBIC ACID Take 500 mg by mouth daily.   warfarin 5 MG tablet Commonly known as:  COUMADIN Start from tomorrow What changed:    how much to take  how to take this  when to take this  additional instructions      Follow-up Information    Leeroy Cha, MD. Schedule an appointment as soon as possible for a visit in 1 week(s).   Specialty:  Internal Medicine Contact information: [address] [place] [address] [phone]          Allergies  Allergen Reactions  . Gluten Meal Other (See Comments)    Gluten Sensitivity (reaction undefined) NO BREAD  . Wheat Bran Other (See Comments)    Reaction undefined    Consultations:  None   Procedures/Studies: Dg Chest 2 View  Result Date: 07/02/2018 CLINICAL DATA:  Cough and fever. EXAM: CHEST - 2 VIEW COMPARISON:  09/13/2015 FINDINGS: The cardiac silhouette, mediastinal and hilar contours are within  normal limits and stable. Underlying bronchitic type lung changes with superimposed patchy infiltrates. There is also a small right pleural effusion. Bilateral nodular opacities in the lower chest most consistent with nipple shadows. The bony thorax is intact. IMPRESSION: Underlying chronic lung changes with superimposed bilateral infiltrates. Followup PA and lateral chest X-ray is recommended in 3-4 weeks following trial of antibiotic therapy to ensure resolution and exclude underlying malignancy. Electronically Signed   By: Marijo Sanes M.D.   On: 07/02/2018 11:46      Subjective: Patient seen and examined the bedside this morning.  Remains comfortable.  Afebrile.  Respiratory status has improved.  Stable for discharge to home.  Discharge Exam: Vitals:   07/04/18 0806 07/04/18 0815  BP:  130/71  Pulse:  70  Resp:  18  Temp:  (!) 97.4 F (36.3 C)  SpO2: 97% 96%   Vitals:   07/03/18 2049 07/04/18 0000 07/04/18 0806 07/04/18 0815  BP:  129/71  130/71  Pulse:  71  70  Resp:    18  Temp:  98.1 F (36.7 C)  (!) 97.4 F (36.3 C)  TempSrc:  Oral  Oral  SpO2: 95% 97% 97% 96%  Weight:      Height:        General: Pt is alert, awake, not in acute distress Cardiovascular: RRR, S1/S2 +, no rubs, no gallops Respiratory: CTA bilaterally, no wheezing, no rhonchi Abdominal: Soft, NT, ND, bowel sounds + Extremities: no edema, no cyanosis    The results of significant diagnostics from this hospitalization (including imaging, microbiology, ancillary and laboratory) are listed below for reference.     Microbiology: Recent Results (from the past 240 hour(s))  Blood culture (routine x 2)     Status: None (Preliminary result)   Collection Time: 07/02/18  1:56 PM  Result Value Ref Range Status   Specimen Description BLOOD RIGHT ANTECUBITAL  Final   Special Requests   Final    BOTTLES DRAWN AEROBIC AND ANAEROBIC Blood Culture adequate volume   Culture   Final    NO GROWTH 2  DAYS Performed at [facility], [address], [place], [place] [address]    Report Status PENDING  Incomplete  Blood culture (routine x 2)     Status: None (Preliminary result)   Collection Time: 07/02/18  1:56 PM  Result Value Ref Range Status   Specimen Description BLOOD SITE NOT SPECIFIED  Final   Special Requests   Final    BOTTLES DRAWN AEROBIC AND ANAEROBIC Blood Culture results may not be optimal due to an inadequate volume of blood received in culture bottles   Culture   Final    NO GROWTH 2 DAYS Performed at [facility], [place] [address], [place], [place] [address]    Report Status PENDING  Incomplete  Urine culture     Status: Abnormal   Collection Time: 07/03/18 12:11 AM  Result Value Ref Range Status   Specimen Description URINE, CLEAN CATCH  Final   Special Requests NONE  Final   Culture (A)  Final    <10,000 COLONIES/mL INSIGNIFICANT GROWTH Performed at [facility] [address], [place], [place] [address]    Report Status 07/04/2018 FINAL  Final     Labs: BNP (last 3 results) No results for input(s): BNP in the last 8760 hours. Basic Metabolic Panel: Recent Labs  Lab 07/02/18 1356 07/03/18 0346  NA 140 140  K 3.8 3.5  CL 104 109  CO2 25 23  GLUCOSE 108* 89  BUN 17 17  CREATININE 0.97 0.83  CALCIUM 8.8* 8.4*   Liver Function Tests: Recent Labs  Lab 07/02/18 1356  AST 35  ALT 29  ALKPHOS 80  BILITOT 0.9  PROT 7.1  ALBUMIN 3.2*   No results for input(s): LIPASE, AMYLASE in the last 168 hours. No results for input(s): AMMONIA in the last 168 hours. CBC: Recent Labs  Lab 07/02/18 1356 07/03/18 0346 07/04/18 0612  WBC 14.2* 13.3* 12.6*  NEUTROABS 12.4*  --  10.7*  HGB 14.0 12.7* 11.8*  HCT 40.3 37.8* 34.7*  MCV 98.5 98.4 97.7  PLT 256 229 214   Cardiac Enzymes: No results for input(s): CKTOTAL, CKMB, CKMBINDEX, TROPONINI in the last 168 hours. BNP: Invalid input(s): POCBNP CBG: No results for input(s): GLUCAP in  the last 168 hours. D-Dimer No results for input(s): DDIMER in the last 72 hours. Hgb A1c No results for input(s): HGBA1C in the last 72 hours. Lipid Profile No results for input(s): CHOL, HDL, LDLCALC, TRIG, CHOLHDL, LDLDIRECT in the last 72 hours. Thyroid function studies No results for input(s):  TSH, T4TOTAL, T3FREE, THYROIDAB in the last 72 hours.  Invalid input(s): FREET3 Anemia work up No results for input(s): VITAMINB12, FOLATE, FERRITIN, TIBC, IRON, RETICCTPCT in the last 72 hours. Urinalysis    Component Value Date/Time   COLORURINE AMBER (A) 07/03/2018 0011   APPEARANCEUR HAZY (A) 07/03/2018 0011   LABSPEC 1.018 07/03/2018 0011   PHURINE 5.0 07/03/2018 0011   GLUCOSEU NEGATIVE 07/03/2018 0011   HGBUR NEGATIVE 07/03/2018 0011   BILIRUBINUR NEGATIVE 07/03/2018 0011   KETONESUR NEGATIVE 07/03/2018 0011   PROTEINUR NEGATIVE 07/03/2018 0011   NITRITE NEGATIVE 07/03/2018 0011   LEUKOCYTESUR NEGATIVE 07/03/2018 0011   Sepsis Labs Invalid input(s): PROCALCITONIN,  WBC,  LACTICIDVEN Microbiology Recent Results (from the past 240 hour(s))  Blood culture (routine x 2)     Status: None (Preliminary result)   Collection Time: 07/02/18  1:56 PM  Result Value Ref Range Status   Specimen Description BLOOD RIGHT ANTECUBITAL  Final   Special Requests   Final    BOTTLES DRAWN AEROBIC AND ANAEROBIC Blood Culture adequate volume   Culture   Final    NO GROWTH 2 DAYS Performed at [facility], [place] [address], [place], [place] [address]    Report Status PENDING  Incomplete  Blood culture (routine x 2)     Status: None (Preliminary result)   Collection Time: 07/02/18  1:56 PM  Result Value Ref Range Status   Specimen Description BLOOD SITE NOT SPECIFIED  Final   Special Requests   Final    BOTTLES DRAWN AEROBIC AND ANAEROBIC Blood Culture results may not be optimal due to an inadequate volume of blood received in culture bottles   Culture   Final    NO GROWTH 2  DAYS Performed at [facility], [place] [address], [place], [place] [address]    Report Status PENDING  Incomplete  Urine culture     Status: Abnormal   Collection Time: 07/03/18 12:11 AM  Result Value Ref Range Status   Specimen Description URINE, CLEAN CATCH  Final   Special Requests NONE  Final   Culture (A)  Final    <10,000 COLONIES/mL INSIGNIFICANT GROWTH Performed at [facility] [address] [place], [place] [address]    Report Status 07/04/2018 FINAL  Final    Please note: You were cared for by a hospitalist during your hospital stay. Once you are discharged, your primary care physician will handle any further medical issues. Please note that NO REFILLS for any discharge medications will be authorized once you are discharged, as it is imperative that you return to your primary care physician (or establish a relationship with a primary care physician if you do not have one) for your post hospital discharge needs so that they can reassess your need for medications and monitor your lab values.    Time coordinating discharge: 40 minutes  SIGNED:   Shelly Coss, MD  [facility] 07/04/2018, 10:31 AM Pager [phone]  If 7PM-7AM, please contact night-coverage [email or web address] Password [email or web address]

## 2018-07-06 ENCOUNTER — Other Ambulatory Visit: Payer: Self-pay | Admitting: Urology

## 2018-07-07 LAB — CULTURE, BLOOD (ROUTINE X 2)
Culture: NO GROWTH
Culture: NO GROWTH
SPECIAL REQUESTS: ADEQUATE

## 2018-07-09 DIAGNOSIS — J189 Pneumonia, unspecified organism: Secondary | ICD-10-CM | POA: Diagnosis not present

## 2018-07-09 DIAGNOSIS — Z86711 Personal history of pulmonary embolism: Secondary | ICD-10-CM | POA: Diagnosis not present

## 2018-07-09 DIAGNOSIS — Z7901 Long term (current) use of anticoagulants: Secondary | ICD-10-CM | POA: Diagnosis not present

## 2018-07-09 DIAGNOSIS — R55 Syncope and collapse: Secondary | ICD-10-CM | POA: Diagnosis not present

## 2018-07-09 DIAGNOSIS — R350 Frequency of micturition: Secondary | ICD-10-CM | POA: Diagnosis not present

## 2018-07-10 ENCOUNTER — Encounter: Payer: Self-pay | Admitting: Internal Medicine

## 2018-07-10 ENCOUNTER — Ambulatory Visit (INDEPENDENT_AMBULATORY_CARE_PROVIDER_SITE_OTHER)
Admission: RE | Admit: 2018-07-10 | Discharge: 2018-07-10 | Disposition: A | Discharge status: Home / Self Care | Payer: Medicare Other | Source: Ambulatory Visit | Attending: Internal Medicine | Admitting: Internal Medicine

## 2018-07-10 ENCOUNTER — Ambulatory Visit (INDEPENDENT_AMBULATORY_CARE_PROVIDER_SITE_OTHER): Payer: Medicare Other | Admitting: Internal Medicine

## 2018-07-10 VITALS — BP 112/68 | HR 92 | Ht 73.0 in | Wt 181.0 lb

## 2018-07-10 DIAGNOSIS — R0609 Other forms of dyspnea: Secondary | ICD-10-CM | POA: Diagnosis not present

## 2018-07-10 DIAGNOSIS — R058 Other specified cough: Secondary | ICD-10-CM

## 2018-07-10 DIAGNOSIS — R05 Cough: Secondary | ICD-10-CM

## 2018-07-10 DIAGNOSIS — J189 Pneumonia, unspecified organism: Secondary | ICD-10-CM

## 2018-07-10 DIAGNOSIS — R0600 Dyspnea, unspecified: Secondary | ICD-10-CM

## 2018-07-10 DIAGNOSIS — R0602 Shortness of breath: Secondary | ICD-10-CM | POA: Diagnosis not present

## 2018-07-10 DIAGNOSIS — I10 Essential (primary) hypertension: Secondary | ICD-10-CM

## 2018-07-10 LAB — TSH: TSH: 1.29 u[IU]/mL (ref 0.35–4.50)

## 2018-07-10 LAB — CBC WITH DIFFERENTIAL/PLATELET
Basophils Absolute: 0 10*3/uL (ref 0.0–0.1)
Basophils Relative: 0.5 % (ref 0.0–3.0)
Eosinophils Absolute: 0.4 10*3/uL (ref 0.0–0.7)
Eosinophils Relative: 3.7 % (ref 0.0–5.0)
HCT: 40.4 % (ref 39.0–52.0)
Hemoglobin: 13.6 g/dL (ref 13.0–17.0)
Lymphocytes Relative: 11.4 % — ABNORMAL LOW (ref 12.0–46.0)
Lymphs Abs: 1.1 10*3/uL (ref 0.7–4.0)
MCHC: 33.7 g/dL (ref 30.0–36.0)
MCV: 102.3 fl — ABNORMAL HIGH (ref 78.0–100.0)
Monocytes Absolute: 0.6 10*3/uL (ref 0.1–1.0)
Monocytes Relative: 6.3 % (ref 3.0–12.0)
Neutro Abs: 7.6 10*3/uL (ref 1.4–7.7)
Neutrophils Relative %: 78.1 % — ABNORMAL HIGH (ref 43.0–77.0)
Platelets: 318 10*3/uL (ref 150.0–400.0)
RBC: 3.95 Mil/uL — ABNORMAL LOW (ref 4.22–5.81)
RDW: 12.4 % (ref 11.5–15.5)
WBC: 9.7 10*3/uL (ref 4.0–10.5)

## 2018-07-10 LAB — BASIC METABOLIC PANEL
BUN: 13 mg/dL (ref 6–23)
CO2: 30 mEq/L (ref 19–32)
Calcium: 9.1 mg/dL (ref 8.4–10.5)
Chloride: 107 mEq/L (ref 96–112)
Creatinine, Ser: 0.96 mg/dL (ref 0.40–1.50)
GFR: 76.16 mL/min (ref >60.00)
GLUCOSE: 103 mg/dL — AB (ref 70–99)
Potassium: 3.5 mEq/L (ref 3.5–5.1)
SODIUM: 143 meq/L (ref 135–145)

## 2018-07-10 LAB — SEDIMENTATION RATE: Sed Rate: 19 mm/hr (ref 0–20)

## 2018-07-10 MED ORDER — TELMISARTAN 40 MG PO TABS: 40.0000 mg | ORAL_TABLET | Freq: Every day | ORAL | 11 refills | Status: DC

## 2018-07-10 NOTE — Patient Instructions (Signed)
Please have your ENT doctor fax me your last report   Please remember to go to the lab and x-ray department   for your tests - we will call you with the results when they are available.     Please schedule a follow up office visit in 4 weeks, sooner if needed

## 2018-07-10 NOTE — Progress Notes (Signed)
ATC, NA and no option to leave msg 

## 2018-07-10 NOTE — Progress Notes (Signed)
Jay Day, male    DOB: 03-22-1941,   MRN: [id number]   Brief patient profile:  78 yowm born in [place]   With ? 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 Patient had been having productive cough with yellow-greenish sputum since last 3 weeks.Failed outpatient antibiotic therapy. Chest x-ray showed bilateral basilar pneumonia. Started on IV antibiotics here. Patient's respiratory status has stabilized.  He is afebrile currently.  Cultures negative.  Antibiotics will be changed to oral.  He is stable for discharge to home today.  Following problems were addressed during his hospitalization:  Community-acquired pneumonia:Chest x-ray showed multifocal pneumonia.Started on ceftriaxone and azithromycin to cover for community-acquired pneumonia.He was treated with amoxicillin without improvement as an outpatient.  Antibotics changed to oral on discharge today. Presented with fever. Blood cultures ,sputum culture sent .  No growth till date.Streptococcus antigen negative. Continue Mucinex. Flu panel negative.  Chronic changes on the chest x-ray: Previous smoker. Also reported to have frequent episodes of bronchitis in his younger days. But he did nothave anyissues with his lungs  before this episode.  Hypertension: Currently blood pressure stable. Will resume his home medications.  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 Follows with endocrinologist as an outpatient.  History of PE/DVT:On anticoagulation with warfarin at home.   INR supratherapeutic on presentation.  Resume warfarin from tomorrow.   Discharge Diagnoses:  Principal Problem:   Community acquired pneumonia Active Problems:   Prolactinoma ([place])   HTN (hypertension)   Pituitary adenoma ([place])   Chronic anticoagulation   History of pulmonary embolism   CAP (community acquired pneumonia) Since he is not actually out 6 weeks from the original problem    07/10/2018  Pulmonary/ 1st office eval/Faithanne Verret  Chief Complaint  Patient presents with  . Pulmonary Consult    Referred by Dr. Fara Olden for eval of abnormal cxr. Pt c/o cough since end of Dec 2019.  His cough is currently non prod.   Dyspnea:  Fatigue > sob  Cough: dry cough  Now, never productive with sense of pnds  Sleep: lie flat / 2 pillows SABA use: none   No obvious day to day or daytime variability or assoc excess/ purulent sputum or mucus plugs or hemoptysis or cp or chest tightness, subjective wheeze or overt   hb symptoms.   Sleeping fine as above  without nocturnal  or early am exacerbation  of respiratory  c/o's or need for noct saba. Also denies any obvious fluctuation of symptoms with weather or environmental changes or other aggravating or alleviating factors except as outlined above   No unusual exposure hx or h/o childhood pna  or knowledge of premature birth.  Current Allergies, Complete Past Medical History, Past Surgical History, Family History, and Social History were reviewed in [employer] record.  ROS  The  following are not active complaints unless bolded Hoarseness, sore throat, dysphagia, dental problems, itching, sneezing,  nasal congestion or sense of  discharge of excess mucus or purulent secretions, ear ache,   fever, chills, sweats, unintended wt loss or wt gain, classically pleuritic or exertional cp,  orthopnea pnd or arm/hand swelling  or leg swelling, presyncope, palpitations, abdominal pain, anorexia, nausea, vomiting, diarrhea  or change in bowel habits or change in bladder habits, change in stools or change in urine, dysuria, hematuria,  rash, arthralgias, visual complaints, headache, numbness, weakness or ataxia or problems with walking or coordination,  change in mood or  memory.               Past Medical History:  Diagnosis Date  . Asthma    hx of as child  . Bronchitis    as a child  . Cataract   . Chronic anticoagulation 06/11/2013  . Clotting disorder ([place])   . 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 Elevated PSA    being monitored by physician  . Glaucoma 10/13/2011  . 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 Headache disorder 02/24/2015   occ  . Hypertension    sees [name], primary   . MI (myocardial infarction) ([place]) not sure when   scar tissue saw  . Peyronie's disease   . Pituitary adenoma ([place]) 10/13/2011  . Pituitary tumor    takes medication to manage  . Prostatitis   . Pulmonary embolism ([place]) yrs ago  . Rash    last 3 months chest and arms, saw md no tx given  . Stroke [facility]) 2017   mild cva    Outpatient Medications Prior to Visit  Medication Sig Dispense Refill  . acetaminophen (TYLENOL) 500 MG tablet Take 500 mg by mouth every 6 (six) hours as needed for mild pain.    . B Complex-C (B-COMPLEX WITH VITAMIN C) tablet Take 1 tablet by mouth daily.    . bromocriptine (PARLODEL) 2.5 MG tablet TAKE 1/2 TABLET BY MOUTH 4 TIMES A WEEK (Patient taking differently: Take 1.25 mg by mouth See admin instructions. take 1/2 tablet by mouth  4 times a week.) 9 tablet 0  . captopril (CAPOTEN) 25 MG tablet Take 25 mg by mouth every evening. Reported on 10/16/2015    . guaiFENesin-dextromethorphan (ROBITUSSIN DM) 100-10 MG/5ML syrup Take 10 mLs by mouth every 4 (four) hours as needed for cough. 236 mL 0  . latanoprost (XALATAN) 0.005 % ophthalmic solution Place 1 drop into both eyes at bedtime.   0  . Melatonin 3 MG TABS Take 3 mg by mouth at bedtime as needed (sleep).     . metoprolol succinate (TOPROL-XL) 25 MG 24 hr tablet Take 1 tablet (25 mg total) by mouth daily. (Patient taking differently: Take 12.5 mg by mouth every evening. ) 30 tablet 0  . Probiotic Product (PROBIOTIC ADVANCED PO) Take 1 capsule by mouth daily.    Marland Kitchen warfarin (COUMADIN) 5 MG tablet Start from tomorrow (Patient not taking: Reported on 07/10/2018)    . aspirin EC 81 MG tablet Take 81 mg by mouth daily.    Marland Kitchen azithromycin (ZITHROMAX) 500 MG tablet Take 1 tablet once 1 tablet 0  . Emollient (EUCERIN) lotion Apply topically as needed for dry skin.    . fluticasone (FLONASE) 50  MCG/ACT nasal spray Place 1 spray into both nostrils daily.    Marland Kitchen ibuprofen (ADVIL,MOTRIN) 200 MG tablet Take 400 mg by mouth daily as needed for headache or moderate pain.    . meclizine (ANTIVERT) 12.5 MG tablet Take 1 tablet (12.5 mg total) by mouth 3 (three) times daily as needed for dizziness. (Patient not taking: Reported on 07/02/2018) 30 tablet 0  . naproxen sodium (ALEVE) 220 MG tablet Take 220 mg by mouth daily as needed (pain).    Marland Kitchen OVER THE COUNTER MEDICATION Take 2 tablets by mouth daily. Herbal defense complex    . OVER THE COUNTER MEDICATION Take 10 mLs by mouth at bedtime as needed (cough). Cough and bronchial syrup    . vitamin C (ASCORBIC ACID) 500 MG tablet Take 500 mg by mouth daily.           Objective:     BP 112/68 (BP Location: Left Arm, Cuff Size: Normal)   Pulse 92   Ht 6\' 1"  (1.854 m)   Wt 181 lb (82.1 kg)   SpO2 96%   BMI 23.88 kg/m   SpO2: 96 %  RA     Pleasant wm nad   HEENT: nl dentition, turbinates bilaterally, and oropharynx. Nl external ear canals without cough reflex   NECK :  without JVD/Nodes/TM/ nl carotid upstrokes bilaterally   LUNGS: no acc muscle use,  Nl contour chest which is clear to A and P bilaterally without cough on insp or exp maneuvers   CV:  RRR  no s3 or murmur or increase in P2, and no edema   ABD:  soft and nontender with nl inspiratory excursion in the supine position. No bruits or organomegaly appreciated, bowel sounds nl  MS:  Nl gait/ ext warm without deformities, calf tenderness, cyanosis or clubbing No obvious joint restrictions   SKIN: warm and dry without lesions    NEURO:  alert, approp, nl sensorium with  no motor or cerebellar deficits apparent    .CXR PA and Lateral:   07/10/2018 :    I personally reviewed images and agree with radiology impression as follows:    Partial clearing of bilateral pulmonary infiltrates and atelectasis   Labs ordered/ reviewed:      Chemistry      Component Value Date/Time   NA 143 07/10/2018 1143   NA 144 02/24/2015 1008   K 3.5 07/10/2018 1143   CL 107 07/10/2018 1143   CO2 30 07/10/2018 1143   BUN 13 07/10/2018 1143   BUN 15 02/24/2015 1008   CREATININE 0.96 07/10/2018 1143      Component Value Date/Time   CALCIUM 9.1 07/10/2018 1143   ALKPHOS 80 07/02/2018 1356   AST 35 07/02/2018 1356   ALT 29 07/02/2018 1356   BILITOT 0.9 07/02/2018 1356        Lab Results  Component Value Date   WBC 9.7 07/10/2018   HGB 13.6 07/10/2018   HCT 40.4 07/10/2018   MCV 102.3 (H) 07/10/2018   PLT 318.0 07/10/2018      EOS                                                                0.4  07/10/2018     Lab Results  Component Value Date   TSH 1.29 07/10/2018          Lab Results  Component Value Date   ESRSEDRATE 19 07/10/2018   ESRSEDRATE 2 02/24/2015             Assessment   DOE (dyspnea on  exertion) He is really bothered more by fatigue than shortness of breath at this point with no evidence of significant anemia thyroid disease or heart failure.  No additional work-up is needed at this point.    CAP (community acquired pneumonia) Sed rate is normal as is  his white count but chest x-ray still shows some mild residual changes that most likely represent organizing pneumonia.  Since he is not actually out 6 weeks from the original insult I do not believe additional work-up is needed at this point and if his chest x-ray normalizes a CT scan is also probably not necessary here.    Upper airway cough syndrome S/p eval by ent 2019 records req  - trial off acei rec 07/10/2018    Upper airway cough syndrome (previously labeled PNDS),  is so named because it's frequently impossible to sort out how much is  CR/sinusitis with freq throat clearing (which can be related to primary GERD)   vs  causing  secondary (" extra esophageal")  GERD from wide swings in gastric pressure that occur with throat clearing, often  promoting self use of mint and menthol lozenges that reduce the lower esophageal sphincter tone and exacerbate the problem further in a cyclical fashion.   These are the same pts (now being labeled as having "irritable larynx syndrome" by some cough centers) who not infrequently have a history of having failed to tolerate ace inhibitors,  dry powder inhalers or biphosphonates or report having atypical/extraesophageal reflux symptoms that don't respond to standard doses of PPI  and are easily confused as having aecopd or asthma flares by even experienced allergists/ pulmonologists (myself included).   Would first trial of ACE inhibitor for additional work-up and reevaluate him when he returns for follow-up chest x-ray in about a month.    Essential hypertension Try off ACE inhibitor's 07/10/18  In the best review of chronic cough to date ( NEJM 2016 375 1544-1551) ,  ACEi are now  felt to cause cough in up to  20% of pts which is a 4 fold increase from previous reports and does not include the variety of non-specific complaints we see in pulmonary clinic in pts on ACEi but previously attributed to another dx like  Copd/asthma and  include PNDS, throat and chest congestion, "bronchitis", unexplained dyspnea and noct "strangling" sensations, and hoarseness, but also  atypical /refractory GERD symptoms like dysphagia and "bad heartburn"   The only way I know  to prove this is not an "ACEi Case" is a trial off ACEi x a minimum of 6 weeks then regroup.   Will try micardis  40 mg 1 daily and follow-up in 4 weeks.       Total time devoted to counseling  > 50 % of initial 60 min office visit:  review case with pt/wife discussion of options/alternatives/ personally creating written customized instructions  in presence of pt  then going over those specific  Instructions directly with the pt including how to use all of the meds but in particular covering each new medication in detail and the difference between the maintenance= "automatic" meds and the prns using an  action plan format for the latter (If this problem/symptom => do that organization reading Left to right).  Please see AVS from this visit for a full list of these instructions which I personally wrote for this pt and  are unique to this visit.      Christinia Gully, MD 07/10/2018

## 2018-07-11 ENCOUNTER — Encounter: Payer: Self-pay | Admitting: Internal Medicine

## 2018-07-11 DIAGNOSIS — R05 Cough: Secondary | ICD-10-CM | POA: Insufficient documentation

## 2018-07-11 DIAGNOSIS — R058 Other specified cough: Secondary | ICD-10-CM | POA: Insufficient documentation

## 2018-07-11 NOTE — Assessment & Plan Note (Addendum)
Sed rate is normal as is  his white count but chest x-ray still shows some mild residual changes that most likely represent organizing pneumonia.  Since he is not actually out 6 weeks from the original insult I do not believe additional work-up is needed at this point and if his chest x-ray normalizes a CT scan is also probably not necessary here.

## 2018-07-11 NOTE — Assessment & Plan Note (Signed)
S/p eval by ent 2019 records req  - trial off acei rec 07/10/2018    Upper airway cough syndrome (previously labeled PNDS),  is so named because it's frequently impossible to sort out how much is  CR/sinusitis with freq throat clearing (which can be related to primary GERD)   vs  causing  secondary (" extra esophageal")  GERD from wide swings in gastric pressure that occur with throat clearing, often  promoting self use of mint and menthol lozenges that reduce the lower esophageal sphincter tone and exacerbate the problem further in a cyclical fashion.   These are the same pts (now being labeled as having "irritable larynx syndrome" by some cough centers) who not infrequently have a history of having failed to tolerate ace inhibitors,  dry powder inhalers or biphosphonates or report having atypical/extraesophageal reflux symptoms that don't respond to standard doses of PPI  and are easily confused as having aecopd or asthma flares by even experienced allergists/ pulmonologists (myself included).   Would first trial of ACE inhibitor for additional work-up and reevaluate him when he returns for follow-up chest x-ray in about a month.

## 2018-07-11 NOTE — Assessment & Plan Note (Signed)
Try off ACE inhibitor's 07/10/18  In the best review of chronic cough to date ( NEJM 2016 375 1544-1551) ,  ACEi are now felt to cause cough in up to  20% of pts which is a 4 fold increase from previous reports and does not include the variety of non-specific complaints we see in pulmonary clinic in pts on ACEi but previously attributed to another dx like  Copd/asthma and  include PNDS, throat and chest congestion, "bronchitis", unexplained dyspnea and noct "strangling" sensations, and hoarseness, but also  atypical /refractory GERD symptoms like dysphagia and "bad heartburn"   The only way I know  to prove this is not an "ACEi Case" is a trial off ACEi x a minimum of 6 weeks then regroup.   Will try micardis  40 mg 1 daily and follow-up in 4 weeks.    Total time devoted to counseling  > 50 % of initial 60 min office visit:  review case with pt/wife discussion of options/alternatives/ personally creating written customized instructions  in presence of pt  then going over those specific  Instructions directly with the pt including how to use all of the meds but in particular covering each new medication in detail and the difference between the maintenance= "automatic" meds and the prns using an action plan format for the latter (If this problem/symptom => do that organization reading Left to right).  Please see AVS from this visit for a full list of these instructions which I personally wrote for this pt and  are unique to this visit.

## 2018-07-11 NOTE — Assessment & Plan Note (Addendum)
He is really bothered more by fatigue than shortness of breath at this point with no evidence of significant anemia thyroid disease or heart failure.  No additional work-up is needed at this point.

## 2018-07-12 ENCOUNTER — Telehealth: Payer: Self-pay | Admitting: Internal Medicine

## 2018-07-12 DIAGNOSIS — Z7901 Long term (current) use of anticoagulants: Secondary | ICD-10-CM | POA: Diagnosis not present

## 2018-07-12 NOTE — Progress Notes (Signed)
Spoke with pt and notified of results per Dr. Wert. Pt verbalized understanding and denied any questions. 

## 2018-07-12 NOTE — Telephone Encounter (Signed)
Spoke with pt. He is wanting to know if it's necessary for him to have a PFT. Pt's PCP is wanting him to have one. He is also inquiring about his pulmonary embolism and if he should be taking coumadin. States that his PCP sent [place] a bunch of records with all of this information in it.  MW - please advise. Thanks.

## 2018-07-13 NOTE — Telephone Encounter (Signed)
Spoke with patient and advised of Dr. Gustavus Bryant response Patient said he wanted to let Dr. Melvyn Novas, he has not been abel to get a hold of the ENT records from 5 years ago. That doctor is no longer at that location. His PCP does not have those records in his chart either.  Advised him to bring them for his next visit if able to access. Patient voiced understanding, nothing further needed at this time.

## 2018-07-13 NOTE — Telephone Encounter (Signed)
Does not need pft based on my eval but they may be subject to change when her returns depending on cxr and how he's doing clinically  Did not address the coumadin issue which is an entirely separate work up but happy to sort through records with him on return and come up with rec but for now would not change the coumadin s physician imput

## 2018-07-24 ENCOUNTER — Other Ambulatory Visit: Payer: Self-pay | Admitting: Endocrinology

## 2018-07-27 ENCOUNTER — Other Ambulatory Visit: Payer: Self-pay | Admitting: Endocrinology

## 2018-07-29 ENCOUNTER — Other Ambulatory Visit: Payer: Self-pay | Admitting: Endocrinology

## 2018-07-29 DIAGNOSIS — D352 Benign neoplasm of pituitary gland: Secondary | ICD-10-CM

## 2018-07-30 DIAGNOSIS — C61 Malignant neoplasm of prostate: Secondary | ICD-10-CM | POA: Diagnosis not present

## 2018-08-01 ENCOUNTER — Ambulatory Visit (INDEPENDENT_AMBULATORY_CARE_PROVIDER_SITE_OTHER): Payer: Medicare Other | Admitting: Cardiology

## 2018-08-01 ENCOUNTER — Encounter: Payer: Self-pay | Admitting: Cardiology

## 2018-08-01 VITALS — BP 114/80 | HR 65 | Ht 73.0 in | Wt 190.8 lb

## 2018-08-01 DIAGNOSIS — I2510 Atherosclerotic heart disease of native coronary artery without angina pectoris: Secondary | ICD-10-CM | POA: Diagnosis not present

## 2018-08-01 DIAGNOSIS — I700 Atherosclerosis of aorta: Secondary | ICD-10-CM

## 2018-08-01 DIAGNOSIS — J181 Lobar pneumonia, unspecified organism: Secondary | ICD-10-CM | POA: Diagnosis not present

## 2018-08-01 DIAGNOSIS — J189 Pneumonia, unspecified organism: Secondary | ICD-10-CM

## 2018-08-01 DIAGNOSIS — Z86711 Personal history of pulmonary embolism: Secondary | ICD-10-CM

## 2018-08-01 NOTE — Progress Notes (Signed)
Cardiology Office Note:    Date:  08/01/2018   ID:  Jay Day, DOB 09-07-1940, MRN [id number]  PCP:  Leeroy Cha, MD  Cardiologist:  [name], MD  Electrophysiologist:  None   Referring MD: Leeroy Cha,*     History of Present Illness:    Jay Day is a 78 y.o. male here for the evaluation of coronary artery disease at the request of Dr. Fara Olden.   I had seen him back in 2016 after he underwent cardiac catheterization in 2015, 05/06/2014 with 30% mid LAD stenosis, non-flow-limiting with ejection fraction of 45 to 50%.  He had an apical dyskinetic/aneurysmal segment.  We continue with medical management. CVA.   In 2016 he went to the emergency room with chest discomfort left-sided constant troponin was negative and CT of the chest was also negative.  He has a history of PE DVT.  Discharged home.  Echocardiogram in 2017 had normal EF. Used to enjoy swimming.    Back in 2012 pulmonary medicine was treated with Coumadin and then had recurrent PE in 2013 and the Coumadin was restarted.  Dr. [name] monitor him at that point.  Prior EKG demonstrated PVC with no ST changes.  Recent hospitalization/discharge on 07/04/2018 3 day with community-acquired pneumonia. Cough fever collapsed at home.  103 fever.  Treated for pneumonia.  Overall feeling so much better.  He did see Dr. Melvyn Novas, switched him from ACE inhibitor over to Telmisartan.  Currently doing well.  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  Very talented.  He and his wife also like to go to the cafeteria at [facility], a walk from their house, and eat dinner with the students.    Past Medical History:  Diagnosis Date  . Asthma    hx of as child  . Bronchitis    as a child  . Cataract   . Chronic anticoagulation 06/11/2013  . Clotting disorder ([place])   . DVT, lower extremity, distal, chronic, left 10/13/2011   On doppler 02/05/09  Greater saphenous - over short  distance in calf  . Elevated PSA    being monitored by physician  . Glaucoma 10/13/2011  . 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 Headache disorder 02/24/2015   occ  . Hypertension    sees [name], primary   . MI (myocardial infarction) ([place]) not sure when   scar tissue saw  . Peyronie's disease   . 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 Pituitary tumor    takes medication to manage  . Prostatitis   . Pulmonary embolism ([place]) yrs ago  . Rash    last 3 months chest and arms, saw md no tx given  . Stroke [facility]) 2017   mild cva    Past Surgical History:  Procedure Laterality Date  . COLONOSCOPY N/A 08/22/2016   Procedure: COLONOSCOPY;  Surgeon: Garlan Fair, MD;  Location: [facility];  Service: Endoscopy;  Laterality: N/A;  . colonscopy  2007  . EYE SURGERY     bilateral cataract surgery  . HERNIA REPAIR  1960  . INGUINAL HERNIA REPAIR  08/12/2011   Procedure: LAPAROSCOPIC INGUINAL HERNIA;  Surgeon: [name], MD;  Location: [place];  Service: General;  Laterality: Left;  . [place]   left   . LEFT HEART CATHETERIZATION WITH CORONARY ANGIOGRAM N/A 05/06/2014   Procedure:  LEFT HEART CATHETERIZATION WITH CORONARY ANGIOGRAM;  Surgeon: [name], MD;  Location: [facility] CATH LAB;  Service: Cardiovascular;  Laterality: N/A;  . TONSILLECTOMY     as a child    Current Medications: Current Meds  Medication Sig  . bromocriptine (PARLODEL) 2.5 MG tablet TAKE 1/2 TABLET BY MOUTH 4 TIMES A WEEK  . latanoprost (XALATAN) 0.005 % ophthalmic solution Place 1 drop into both eyes at bedtime.   . Melatonin 3 MG TABS Take 3 mg by mouth at bedtime as needed (sleep).   . metoprolol succinate (TOPROL-XL) 25 MG 24 hr tablet Take 12.5 mg by mouth daily.  . Probiotic Product (PROBIOTIC ADVANCED PO) Take 1 capsule by mouth daily.  Marland Kitchen telmisartan (MICARDIS) 40 MG tablet Take 1 tablet (40 mg total) by  mouth daily.  Marland Kitchen warfarin (COUMADIN) 5 MG tablet Start from tomorrow     Allergies:   Gluten meal and Wheat bran   Social History   Socioeconomic History  . Marital status: Married    Spouse name: Not on file  . Number of children: 2  . Years of education: BA  . Highest education level: Not on file  Occupational History  . Occupation: [employer]  Social Needs  . Financial resource strain: Not on file  . Food insecurity:    Worry: Not on file    Inability: Not on file  . Transportation needs:    Medical: Not on file    Non-medical: Not on file  Tobacco Use  . Smoking status: Former Smoker    Packs/day: 0.30    Years: 5.00    Pack years: 1.50    Types: Cigarettes    Last attempt to quit: 06/13/1970    Years since quitting: 48.1  . Smokeless tobacco: Never Used  Substance and Sexual Activity  . Alcohol use: No    Alcohol/week: 0.0 standard drinks  . Drug use: No  . Sexual activity: Not on file  Lifestyle  . Physical activity:    Days per week: Not on file    Minutes per session: Not on file  . Stress: Not on file  Relationships  . Social connections:    Talks on phone: Not on file    Gets together: Not on file    Attends religious service: Not on file    Active member of club or organization: Not on file    Attends meetings of clubs or organizations: Not on file    Relationship status: Not on file  Other Topics Concern  . Not on file  Social History Narrative   Patient drinks about 2 cups of caffeine daily.   Patient is left handed.     Family History: The patient's family history includes Hypertension in his mother; Kidney Stones in his mother; Kidney failure in his mother; Prostate cancer in his father; Pulmonary embolism in his father, maternal grandfather, maternal grandmother, mother, paternal grandfather, and paternal grandmother.  ROS:   Please see the history of present illness.    No recurrent pneumonialike symptoms no recurrent syncopal  symptoms, no fevers, no cough.  EKGs/Labs/Other Studies Reviewed:    The following studies were reviewed today:  CT scan of chest: 06/26/12-LAD calcification noted throughout proximal and midsection, aortic atherosclerosis noted as well.  Nuclear stress test 03/24/14-Intermediate risk stress nuclear study with small-sized, severe intensity fixed apical defect consistent with scar. No reversible ischemia.  LV Ejection Fraction: 48%. LV Wall Motion: apical akinesis.  ECHO 2017: Left ventricle: The cavity  size was normal. There was moderate   concentric hypertrophy. Systolic function was normal. The   estimated ejection fraction was in the range of 55% to 60%. Wall   motion was normal; there were no regional wall motion   abnormalities. Doppler parameters are consistent with abnormal   left ventricular relaxation (grade 1 diastolic dysfunction). The   E/e&' ratio is <8, suggesting normal LV filling pressure. - Mitral valve: Calcified annulus. There was trivial regurgitation. - Left atrium: The atrium was normal in size. - Right atrium: The atrium was mildly dilated. - Inferior vena cava: The vessel was normal in size. The   respirophasic diameter changes were in the normal range (>= 50%),   consistent with normal central venous pressure.  Impressions:  - LVEF 55-60%, moderate LVH, normal wall motion, diastolic   dysfunction, normal LV filling pressure, normal LA size, mild   RAE, normal IVC.  EKG: Prior ECG showed sinus rhythm without any other abnormalities.  Recent Labs: 07/02/2018: ALT 29 07/10/2018: BUN 13; Creatinine, Ser 0.96; Hemoglobin 13.6; Platelets 318.0; Potassium 3.5; Sodium 143; TSH 1.29  Recent Lipid Panel    Component Value Date/Time   CHOL 159 09/14/2015 0618   TRIG 196 (H) 09/14/2015 0618   HDL 40 (L) 09/14/2015 0618   CHOLHDL 4.0 09/14/2015 0618   VLDL 39 09/14/2015 0618   LDLCALC 80 09/14/2015 0618    Physical Exam:    VS:  BP 114/80   Pulse 65    Ht 6\' 1"  (1.854 m)   Wt 190 lb 12.8 oz (86.5 kg)   SpO2 98%   BMI 25.17 kg/m     Wt Readings from Last 3 Encounters:  08/01/18 190 lb 12.8 oz (86.5 kg)  07/10/18 181 lb (82.1 kg)  07/02/18 185 lb (83.9 kg)     GEN:  Well nourished, well developed in no acute distress HEENT: Normal NECK: No JVD; No carotid bruits LYMPHATICS: No lymphadenopathy CARDIAC: RRR, no murmurs, rubs, gallops RESPIRATORY:  Clear to auscultation without rales, wheezing or rhonchi  ABDOMEN: Soft, non-tender, non-distended MUSCULOSKELETAL:  No edema; No deformity  SKIN: Warm and dry NEUROLOGIC:  Alert and oriented x 3 PSYCHIATRIC:  Normal affect   ASSESSMENT:    1. Community acquired pneumonia of left lower lobe of lung ([place])   2. Aortic atherosclerosis ([place])   3. History of pulmonary embolus (PE)   4. Coronary artery disease involving native coronary artery of native heart without angina pectoris    PLAN:    In order of problems listed above:  Minor nonobstructive coronary artery disease on cardiac catheterization 30% LAD.  Apical scar noted on left ventriculogram. - On Toprol 12.5 XL.   EF was 48% on nuclear stress.  Switch from ACE inhibitor to ARB.  Last echocardiogram in 2017 actually showed normal EF.  Aortic atherosclerosis, coronary artery calcification - Has not been interested in statin therapies.  LDL was 107 previously.  Continue with diet and exercise.  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  Considered DOAC but did not convert.  Doubt that he would be a candidate for in-home INR given that he is not homebound.  Recent pneumonia -His collapse at home was from exhaustion/dehydration in the setting of fever 103 and pneumonia.  In the hospital setting there was no evidence of any heart arrhythmia.  Reassurance.  Now feels better.  Obviously if future episodes of syncope were to occur without warning, he is to let me know.  Let  us know if we can be of  further assistance.  Medication Adjustments/Labs and Tests Ordered: Current medicines are reviewed at length with the patient today.  Concerns regarding medicines are outlined above.  No orders of the defined types were placed in this encounter.  No orders of the defined types were placed in this encounter.   Patient Instructions  Medication Instructions:  The current medical regimen is effective;  continue present plan and medications.  If you need a refill on your cardiac medications before your next appointment, please call your pharmacy.   Follow-Up: Follow up as needed with Dr Marlou Porch.  Thank you for choosing [facility]!!        Signed, [name], MD  08/01/2018 5:12 PM    [place]

## 2018-08-01 NOTE — Patient Instructions (Signed)
Medication Instructions:  The current medical regimen is effective;  continue present plan and medications.  If you need a refill on your cardiac medications before your next appointment, please call your pharmacy.   Follow-Up: Follow up as needed with Dr Skains.  Thank you for choosing [facility]!!      

## 2018-08-03 ENCOUNTER — Other Ambulatory Visit (INDEPENDENT_AMBULATORY_CARE_PROVIDER_SITE_OTHER): Payer: Medicare Other

## 2018-08-03 DIAGNOSIS — D352 Benign neoplasm of pituitary gland: Secondary | ICD-10-CM | POA: Diagnosis not present

## 2018-08-04 LAB — PROLACTIN: Prolactin: 4.3 ng/mL (ref 4.0–15.2)

## 2018-08-08 ENCOUNTER — Other Ambulatory Visit: Payer: Self-pay

## 2018-08-08 ENCOUNTER — Encounter: Payer: Self-pay | Admitting: Internal Medicine

## 2018-08-08 ENCOUNTER — Ambulatory Visit
Admission: RE | Admit: 2018-08-08 | Discharge: 2018-08-08 | Disposition: A | Discharge status: Home / Self Care | Payer: Medicare Other | Source: Ambulatory Visit | Attending: Internal Medicine | Admitting: Internal Medicine

## 2018-08-08 ENCOUNTER — Other Ambulatory Visit: Payer: Self-pay | Admitting: Internal Medicine

## 2018-08-08 ENCOUNTER — Ambulatory Visit (INDEPENDENT_AMBULATORY_CARE_PROVIDER_SITE_OTHER): Payer: Medicare Other | Admitting: Internal Medicine

## 2018-08-08 ENCOUNTER — Ambulatory Visit: Payer: Medicare Other | Admitting: Endocrinology

## 2018-08-08 VITALS — BP 142/80 | HR 63 | Ht 73.0 in | Wt 192.3 lb

## 2018-08-08 DIAGNOSIS — N401 Enlarged prostate with lower urinary tract symptoms: Secondary | ICD-10-CM | POA: Diagnosis not present

## 2018-08-08 DIAGNOSIS — J181 Lobar pneumonia, unspecified organism: Secondary | ICD-10-CM | POA: Diagnosis not present

## 2018-08-08 DIAGNOSIS — N5201 Erectile dysfunction due to arterial insufficiency: Secondary | ICD-10-CM | POA: Diagnosis not present

## 2018-08-08 DIAGNOSIS — J189 Pneumonia, unspecified organism: Secondary | ICD-10-CM | POA: Diagnosis not present

## 2018-08-08 DIAGNOSIS — D352 Benign neoplasm of pituitary gland: Secondary | ICD-10-CM

## 2018-08-08 DIAGNOSIS — R350 Frequency of micturition: Secondary | ICD-10-CM | POA: Diagnosis not present

## 2018-08-08 DIAGNOSIS — Z86711 Personal history of pulmonary embolism: Secondary | ICD-10-CM | POA: Diagnosis not present

## 2018-08-08 DIAGNOSIS — Z7901 Long term (current) use of anticoagulants: Secondary | ICD-10-CM | POA: Diagnosis not present

## 2018-08-08 DIAGNOSIS — H401131 Primary open-angle glaucoma, bilateral, mild stage: Secondary | ICD-10-CM | POA: Diagnosis not present

## 2018-08-08 DIAGNOSIS — C61 Malignant neoplasm of prostate: Secondary | ICD-10-CM | POA: Diagnosis not present

## 2018-08-08 NOTE — Progress Notes (Signed)
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 He has followed with [facility] since 10/2014 for consultative assistance with management of his prolactinoma.   HISTORICAL SUMMARY: The patient was first diagnosed with prolactinoma in 2003 after he presented with c/o decreased libido. He was found to have high prolactin of 99 ng/mL with a 1.4 cm right sided pituitary tumor.   He was started on Bromocriptine MRI in 2013 did not show any change in pituitary tumor.  No evidence of hypopituitarism on his evaluation in 10/2014  When he was not taking bromocriptine  his prolactin went up to 31 ng/mL   SUBJECTIVE:   During last visit (12/22/2017): He was continued on bromocriptine half tablet 4 days a week   Today (08/09/2018):  Jay Day is here for a 6 month follow up on prolactinoma.  He is accompanied by a friend today.  He denies any headaches, vision changes, nausea, dizziness, or diarrhea. Patient states compliance with his bromocriptine. He is currently taking 1.25 mg of bromocriptine 4 days a week.   ROS:  As per HPI.   HISTORY:  Past Medical History:  Past Medical History:  Diagnosis Date  . Asthma    hx of as child  . Bronchitis    as a child  . Cataract   . Chronic anticoagulation 06/11/2013  . Clotting disorder ([place])   . 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 Elevated PSA    being monitored by physician  . Glaucoma 10/13/2011  . 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  Headache disorder 02/24/2015   occ  . Hypertension    sees Dr. Maxwell Day, primary   . MI (myocardial infarction) ([place]) not sure when   scar tissue saw  . Peyronie's disease   . 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 Pituitary tumor    takes medication to manage  . Prostatitis   . Pulmonary embolism ([place]) yrs ago  . Rash    last 3 months chest and arms, saw md no tx given  . Stroke [facility]) 2017   mild cva   Past Surgical History:  Past Surgical History:  Procedure Laterality Date  . COLONOSCOPY N/A 08/22/2016   Procedure: COLONOSCOPY;  Surgeon: Garlan Fair, MD;  [facility];  Service: Endoscopy;  Laterality: N/A;  . colonscopy  2007  . EYE SURGERY     bilateral cataract surgery  . HERNIA REPAIR  1960  . INGUINAL HERNIA REPAIR  08/12/2011   Procedure: LAPAROSCOPIC INGUINAL HERNIA;  Surgeon: [name], MD;  Location: [place];  Service: General;  Laterality: Left;  . [place]   left   . LEFT HEART CATHETERIZATION WITH CORONARY ANGIOGRAM N/A 05/06/2014   Procedure: LEFT HEART CATHETERIZATION WITH CORONARY ANGIOGRAM;  Surgeon: [name], MD;  Location: [facility] CATH LAB;  Service: Cardiovascular;  Laterality: N/A;  . TONSILLECTOMY     as a child    Social History:  reports that he quit  smoking about 48 years ago. His smoking use included cigarettes. He has a 1.50 pack-year smoking history. He has never used smokeless tobacco. He reports that he does not drink alcohol or use drugs. Family History:  Family History  Problem Relation Age of Onset  . Pulmonary embolism Mother   . Kidney failure Mother   . Hypertension Mother   . Kidney Stones Mother   . Pulmonary embolism Father   . Prostate cancer Father   . Pulmonary embolism Maternal Grandmother   . Pulmonary embolism Maternal Grandfather   . Pulmonary embolism Paternal Grandmother   . Pulmonary embolism Paternal Grandfather      HOME MEDICATIONS: Allergies as of 08/08/2018      Reactions   Gluten Meal  Other (See Comments)   Gluten Sensitivity (reaction undefined) NO BREAD   Wheat Bran Other (See Comments)   Reaction undefined      Medication List       Accurate as of August 08, 2018 11:59 PM. Always use your most recent med list.        bromocriptine 2.5 MG tablet Commonly known as:  PARLODEL TAKE 1/2 TABLET BY MOUTH 4 TIMES A WEEK   latanoprost 0.005 % ophthalmic solution Commonly known as:  XALATAN Place 1 drop into both eyes at bedtime.   Melatonin 3 MG Tabs Take 3 mg by mouth at bedtime as needed (sleep).   metoprolol succinate 25 MG 24 hr tablet Commonly known as:  TOPROL-XL Take 12.5 mg by mouth daily.   PROBIOTIC ADVANCED PO Take 1 capsule by mouth daily.   telmisartan 40 MG tablet Commonly known as:  MICARDIS Take 1 tablet (40 mg total) by mouth daily.   warfarin 5 MG tablet Commonly known as:  COUMADIN Start from tomorrow      Old records , labs and images have been reviewed.     OBJECTIVE:   PHYSICAL EXAM: VS: BP (!) 142/80 (BP Location: Left Arm, Patient Position: Sitting, Cuff Size: Normal)   Pulse 63   Ht 6\' 1"  (1.854 m)   Wt 192 lb 4.8 oz (87.2 kg)   SpO2 97%   BMI 25.37 kg/m    EXAM: General: Pt appears well and is in NAD  Neck: General: Supple without adenopathy. Thyroid: Thyroid size normal.  No goiter or nodules appreciated. No thyroid bruit.  Lungs: Clear with good BS bilat with no rales, rhonchi, or wheezes  Heart: Auscultation: RRR.  Abdomen: Normoactive bowel sounds, soft, nontender, without masses or organomegaly palpable  Extremities:  BL LE: No pretibial edema normal ROM and strength.  Mental Status: Judgment, insight: Intact Orientation: Oriented to time, place, and person Mood and affect: No depression, anxiety, or agitation     DATA REVIEWED:  Results for Jay Day (MRN [id number]) as of 08/08/2018 14:43  Ref. Range 08/03/2018 10:17  Prolactin Latest Ref Range: 4.0 - 15.2 ng/mL 4.3    MRI 06/18/2016  T2  hyperintense focus centered within the right aspect of the pituitary gland and cavernous sinus best appreciated on the coronal sequence is decreased in size from the 2003 pituitary MRI of the brain and probably represents residual adenoma.  ASSESSMENT / PLAN / RECOMMENDATIONS:   1. PROLACTINOMA:   - He had a 1.4 cm pituitary tumor at baseline and his initial prolactin level was 99 ng/mL  -Patient is asymptomatic -He denies any side effects to bromocriptine -No evidence of hypopituitarism in 2016, no evidence of hormonal excess either -Clinically he does not have  any signs or symptoms of hypopituitarism nor hormonal excess -His prolactin level was 4.3 ng/mL, the one prior to that was 10.7 ng/mL, it is unclear the reason for discrepancy in prolactin levels despite having the same dose of bromocriptine.  No changes will be made today     Medications   Continue bromocriptine 1.25 mg 4 days a week   Follow-up in 6 months  Signed electronically by: Mack Guise, MD  [facility]  [facility] [address] [place], [place] [address] Phone: [phone] FAX: [phone]      CC: Jay Cha, MD [address] [address] Phone: [phone]  Fax: [phone]   Return to Endocrinology clinic as below: Future Appointments  Date Time Provider [place]  08/10/2018 11:00 AM Tanda Rockers, MD [facility] None  02/06/2019 10:00 AM Elayne Snare, MD [facility] None

## 2018-08-08 NOTE — Patient Instructions (Signed)
Continue Bromocriptine 2.5 mg at half a tablet , four days a week.

## 2018-08-10 ENCOUNTER — Ambulatory Visit (INDEPENDENT_AMBULATORY_CARE_PROVIDER_SITE_OTHER): Payer: Medicare Other | Admitting: Internal Medicine

## 2018-08-10 ENCOUNTER — Encounter: Payer: Self-pay | Admitting: Internal Medicine

## 2018-08-10 VITALS — BP 108/70 | HR 55 | Ht 72.0 in | Wt 191.4 lb

## 2018-08-10 DIAGNOSIS — I10 Essential (primary) hypertension: Secondary | ICD-10-CM

## 2018-08-10 DIAGNOSIS — R0600 Dyspnea, unspecified: Secondary | ICD-10-CM

## 2018-08-10 DIAGNOSIS — R058 Other specified cough: Secondary | ICD-10-CM

## 2018-08-10 DIAGNOSIS — R0609 Other forms of dyspnea: Secondary | ICD-10-CM | POA: Diagnosis not present

## 2018-08-10 DIAGNOSIS — J189 Pneumonia, unspecified organism: Secondary | ICD-10-CM | POA: Diagnosis not present

## 2018-08-10 DIAGNOSIS — R05 Cough: Secondary | ICD-10-CM | POA: Diagnosis not present

## 2018-08-10 DIAGNOSIS — I2510 Atherosclerotic heart disease of native coronary artery without angina pectoris: Secondary | ICD-10-CM | POA: Diagnosis not present

## 2018-08-10 NOTE — Patient Instructions (Signed)
Please remember to go to the lab department   for your tests - we will call you with the results when they are available.       If you are satisfied with your treatment plan,  let your doctor know and he/she can either refill your medications or you can return here when your prescription runs out.     If in any way you are not 100% satisfied,  please tell us.  If 100% better, tell your friends!  Pulmonary follow up is as needed   

## 2018-08-10 NOTE — Assessment & Plan Note (Signed)
Resolved  - no directed f/u needed   I had an extended discussion with the patient and wife reviewing all relevant studies completed to date and  lasting 15 to 20 minutes of a 25 minute visit    Each maintenance medication was reviewed in detail including most importantly the difference between maintenance and prns and under what circumstances the prns are to be triggered using an action plan format that is not reflected in the computer generated alphabetically organized AVS.     Please see AVS for specific instructions unique to this visit that I personally wrote and verbalized to the the pt in detail and then reviewed with pt  by my nurse highlighting any  changes in therapy recommended at today's visit to their plan of care.

## 2018-08-10 NOTE — Progress Notes (Signed)
Jay Day, male    DOB: 09-11-1940,   MRN: [id number]   Brief patient profile:  33 yowm born in [place] 1972   With ? 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 Patient had been having productive cough with yellow-greenish sputum since last 3 weeks.Failed outpatient antibiotic therapy. Chest x-ray showed bilateral basilar pneumonia. Started on IV antibiotics here. Patient's respiratory status has stabilized.  He is afebrile currently.  Cultures negative.  Antibiotics will be changed to oral.  He is stable for discharge to home today.  Following problems were addressed during his hospitalization:  Community-acquired pneumonia:Chest x-ray showed multifocal pneumonia.Started on ceftriaxone and azithromycin to cover for community-acquired pneumonia.He was treated with amoxicillin without improvement as an outpatient.  Antibotics changed to oral on discharge today. Presented with fever. Blood cultures ,sputum culture sent .  No growth till date.Streptococcus antigen negative. Continue Mucinex. Flu panel negative.  Chronic changes on the chest x-ray: Previous smoker. Also reported to have frequent episodes of bronchitis in his younger days. But he did nothave anyissues with his lungs before this episode.  Hypertension: Currently blood pressure stable. Will resume his home medications.  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 Follows with endocrinologist as an outpatient.  History of PE/DVT:On anticoagulation with warfarin at home.   INR supratherapeutic on presentation.  Resume warfarin from tomorrow.   Discharge Diagnoses:  Principal Problem:   Community acquired pneumonia Active Problems:   Prolactinoma ([place])   HTN (hypertension)   Pituitary adenoma ([place])   Chronic anticoagulation   History of pulmonary embolism   CAP (community acquired pneumonia) Since he is not actually out 6 weeks from the original problem   History of Present Illness  07/10/2018  Pulmonary/ 1st office eval/Jay Day  Chief Complaint  Patient presents with  . Pulmonary Consult    Referred by Dr. Fara Olden for eval of abnormal cxr. Pt c/o cough since end of Dec 2019.  His cough is currently non prod.   Dyspnea:  Fatigue > sob  Cough: dry cough  Now, never productive with sense of pnds  Sleep: lie flat / 2 pillows SABA use: none rec Please remember to go to the lab and x-ray department   for your tests - we will call you with the results when they are available. Please schedule a follow up office visit in 4 weeks, sooner if needed    08/10/2018  f/u ov/Jay Day re: cap with  ? Copd by cxr  Chief Complaint  Patient presents with  . Follow-up    better - Denies cough/SOB  Dyspnea:  Swim daily 15 min stopping/  Cough: none  Sleeping: flat/3 pillows SABA use: none 02: none   No obvious day to day or daytime variability or assoc excess/ purulent sputum or mucus plugs or hemoptysis or cp or chest tightness, subjective wheeze or overt sinus or hb symptoms.   Sleeping  without  nocturnal  or early am exacerbation  of respiratory  c/o's or need for noct saba. Also denies any obvious fluctuation of symptoms with weather or environmental changes or other aggravating or alleviating factors except as outlined above   No unusual exposure hx or h/o childhood pna/ asthma or knowledge  of premature birth.  Current Allergies, Complete Past Medical History, Past Surgical History, Family History, and Social History were reviewed in [employer] record.  ROS  The following are not active complaints unless bolded Hoarseness, sore throat, dysphagia, dental problems, itching, sneezing,  nasal congestion or discharge of excess mucus or purulent secretions, ear ache,   fever, chills, sweats, unintended wt loss or wt gain, classically pleuritic or exertional cp,  orthopnea pnd or arm/hand swelling  or leg swelling, presyncope, palpitations, abdominal pain, anorexia, nausea, vomiting, diarrhea  or change in bowel habits or change in bladder habits, change in stools or change in urine, dysuria, hematuria,  rash, arthralgias, visual complaints, headache, numbness, weakness or ataxia or problems with walking or coordination,  change in mood or  Memory. L ankle swelling x years no pain        Current Meds  Medication Sig  . bromocriptine (PARLODEL) 2.5 MG tablet TAKE 1/2 TABLET BY MOUTH 4 TIMES A WEEK  . latanoprost (XALATAN) 0.005 % ophthalmic solution Place 1 drop into both eyes at bedtime.   . Melatonin 3 MG TABS Take 3 mg by mouth at bedtime as needed (sleep).   . metoprolol succinate (TOPROL-XL) 25 MG 24 hr tablet Take 12.5 mg by mouth daily.  . Probiotic Product (PROBIOTIC ADVANCED PO) Take 1 capsule by mouth daily.  Marland Kitchen telmisartan (MICARDIS) 40 MG tablet Take 1 tablet (40 mg total) by mouth daily.  Marland Kitchen warfarin (COUMADIN) 5 MG tablet Start from tomorrow            Objective:     Wt Readings from Last 3 Encounters:  08/10/18 191 lb 6.4 oz (86.8 kg)  08/08/18 192 lb 4.8 oz (87.2 kg)  08/01/18 190 lb 12.8 oz (86.5 kg)     Vital signs reviewed - Note on arrival 02 sats  100% on RA  amb pleasant  wm nasal tone to voice      HEENT: nl dentition, turbinates bilaterally, and oropharynx. Nl external ear canals without cough reflex   NECK :  without  JVD/Nodes/TM/ nl carotid upstrokes bilaterally   LUNGS: no acc muscle use,  Nl contour chest which is clear to A and P bilaterally without cough on insp or exp maneuvers   CV:  RRR  no s3 or murmur or increase in P2, and  Trace L ankle pitting edema  ABD:  soft and nontender with nl inspiratory excursion in the supine position. No bruits or organomegaly appreciated, bowel sounds nl  MS:  Nl gait/ ext warm without deformities, calf tenderness, cyanosis or clubbing No obvious joint restrictions   SKIN: warm and dry without lesions    NEURO:  alert, approp, nl sensorium with  no motor or cerebellar deficits apparent.            I personally reviewed images and agree with radiology impression as follows:  CXR:   08/08/2018 Interval clearing of bilateral airspace disease COPD without acute abnormality.        Assessment

## 2018-08-10 NOTE — Assessment & Plan Note (Signed)
Spirometry 08/10/2018  FEV1 3.0 (92%)  Ratio 0.67 with min curvature off all rx     Discussed with pt that the lower limits of nl for fev1/fvc over dx copd at his age as does the cxr and that he does not have significant copd.  Should he develop a noticeable decline in ex tol I would consider other possibilities and consider trial of LAMA with pulmonary f/u at any time prn

## 2018-08-10 NOTE — Assessment & Plan Note (Addendum)
Onset ? Around 2018?   - trial off acei rec 07/10/2018 > throat clearing improved but still with pnds sensation  - Allergy profile 08/10/2018 >  Eos 0. /  IgE  Ordered

## 2018-08-10 NOTE — Assessment & Plan Note (Addendum)
Try off ACE inhibitor's 07/10/2018 > improved 08/10/2018  Although even in retrospect it may not be clear the ACEi contributed to the pt's symptoms,  Pt improved off them and adding them back at this point or in the future would risk confusion in interpretation of non-specific respiratory symptoms to which this patient is prone  ie  Better not to muddy the waters here.   >>> rec continue micardis 40 mg daily and f/u per PCP for refills before runs out so he can pick best option.  Did warn to stop arb in event of any illness causing potential dehyrdration or potential for decreased RBF

## 2018-08-13 LAB — RESPIRATORY ALLERGY PROFILE REGION II ~~LOC~~
ALLERGEN, CEDAR TREE, T6: 0.26 kU/L — AB
Allergen, A. alternata, m6: 0.52 kU/L — ABNORMAL HIGH
Allergen, Comm Silver Birch, t9: <0.1 kU/L
Allergen, Cottonwood, t14: <0.1 kU/L
Allergen, D pternoyssinus,d7: <0.1 kU/L
Allergen, Mulberry, t76: <0.1 kU/L
Allergen, Oak,t7: 0.25 kU/L — ABNORMAL HIGH
Allergen, P. notatum, m1: <0.1 kU/L
Aspergillus fumigatus, m3: 0.11 kU/L — ABNORMAL HIGH
Bermuda Grass: <0.1 kU/L
Box Elder IgE: <0.1 kU/L
CLADOSPORIUM HERBARUM (M2) IGE: <0.1 kU/L
CLASS: 0
CLASS: 0
COMMON RAGWEED (SHORT) (W1) IGE: <0.1 kU/L
Cat Dander: 0.17 kU/L — ABNORMAL HIGH
Class: 0
Class: 0
Class: 0
Class: 0
Class: 0
Class: 0
Class: 0
Class: 0
Class: 0
Class: 0
Class: 0
Class: 0
Class: 0
Class: 0
Class: 0
Class: 0
Class: 0
Class: 0
Class: 0
Class: 0
Class: 1
Class: 1
Cockroach: <0.1 kU/L
D. farinae: <0.1 kU/L
Dog Dander: <0.1 kU/L
Elm IgE: <0.1 kU/L
IgE (Immunoglobulin E), Serum: 69 kU/L (ref <=114)
Johnson Grass: <0.1 kU/L
Pecan/Hickory Tree IgE: <0.1 kU/L
Sheep Sorrel IgE: <0.1 kU/L
Timothy Grass: 0.43 kU/L — ABNORMAL HIGH

## 2018-08-13 LAB — INTERPRETATION:

## 2018-08-14 NOTE — Progress Notes (Signed)
Spoke with pt and notified of results per Dr. Wert. Pt verbalized understanding and denied any questions. 

## 2018-08-17 DIAGNOSIS — Z7901 Long term (current) use of anticoagulants: Secondary | ICD-10-CM | POA: Diagnosis not present

## 2018-09-11 DIAGNOSIS — C61 Malignant neoplasm of prostate: Secondary | ICD-10-CM | POA: Diagnosis not present

## 2018-09-11 DIAGNOSIS — I2510 Atherosclerotic heart disease of native coronary artery without angina pectoris: Secondary | ICD-10-CM | POA: Diagnosis not present

## 2018-09-11 DIAGNOSIS — I10 Essential (primary) hypertension: Secondary | ICD-10-CM | POA: Diagnosis not present

## 2018-09-11 DIAGNOSIS — I639 Cerebral infarction, unspecified: Secondary | ICD-10-CM | POA: Diagnosis not present

## 2018-11-01 DIAGNOSIS — I10 Essential (primary) hypertension: Secondary | ICD-10-CM | POA: Diagnosis not present

## 2018-11-01 DIAGNOSIS — I2510 Atherosclerotic heart disease of native coronary artery without angina pectoris: Secondary | ICD-10-CM | POA: Diagnosis not present

## 2018-11-01 DIAGNOSIS — Z86711 Personal history of pulmonary embolism: Secondary | ICD-10-CM | POA: Diagnosis not present

## 2018-11-06 DIAGNOSIS — C61 Malignant neoplasm of prostate: Secondary | ICD-10-CM | POA: Diagnosis not present

## 2018-11-07 DIAGNOSIS — H401131 Primary open-angle glaucoma, bilateral, mild stage: Secondary | ICD-10-CM | POA: Diagnosis not present

## 2019-01-16 ENCOUNTER — Other Ambulatory Visit: Payer: Self-pay

## 2019-01-16 ENCOUNTER — Telehealth: Payer: Self-pay

## 2019-01-16 MED ORDER — BROMOCRIPTINE MESYLATE 2.5 MG PO TABS: ORAL_TABLET | ORAL | 0 refills | Status: DC

## 2019-01-16 NOTE — Telephone Encounter (Signed)
This is my prescription.  Also he needs to be scheduled for labs before his blood

## 2019-01-16 NOTE — Telephone Encounter (Signed)
Pt is requesting a refill on Bromocirptine. Is this okay or should it come from PCP?

## 2019-01-16 NOTE — Telephone Encounter (Signed)
Rx has been sent.   Please schedule this pt for labs prior to appt on the 26'th.

## 2019-01-23 DIAGNOSIS — C61 Malignant neoplasm of prostate: Secondary | ICD-10-CM | POA: Diagnosis not present

## 2019-01-28 ENCOUNTER — Other Ambulatory Visit: Payer: Self-pay | Admitting: Endocrinology

## 2019-01-30 DIAGNOSIS — C61 Malignant neoplasm of prostate: Secondary | ICD-10-CM | POA: Diagnosis not present

## 2019-01-30 DIAGNOSIS — N5201 Erectile dysfunction due to arterial insufficiency: Secondary | ICD-10-CM | POA: Diagnosis not present

## 2019-01-31 ENCOUNTER — Other Ambulatory Visit: Payer: Self-pay | Admitting: Endocrinology

## 2019-01-31 DIAGNOSIS — D352 Benign neoplasm of pituitary gland: Secondary | ICD-10-CM

## 2019-02-04 ENCOUNTER — Other Ambulatory Visit (INDEPENDENT_AMBULATORY_CARE_PROVIDER_SITE_OTHER): Payer: Medicare Other

## 2019-02-04 ENCOUNTER — Other Ambulatory Visit: Payer: Self-pay

## 2019-02-04 DIAGNOSIS — D352 Benign neoplasm of pituitary gland: Secondary | ICD-10-CM | POA: Diagnosis not present

## 2019-02-04 DIAGNOSIS — Z7901 Long term (current) use of anticoagulants: Secondary | ICD-10-CM | POA: Diagnosis not present

## 2019-02-05 LAB — T4, FREE: Free T4: 1.04 ng/dL (ref 0.60–1.60)

## 2019-02-05 LAB — PROLACTIN: Prolactin: 10.4 ng/mL (ref 4.0–15.2)

## 2019-02-05 LAB — TESTOSTERONE: Testosterone: 668.75 ng/dL (ref 300.00–890.00)

## 2019-02-06 ENCOUNTER — Ambulatory Visit (INDEPENDENT_AMBULATORY_CARE_PROVIDER_SITE_OTHER): Payer: Medicare Other | Admitting: Endocrinology

## 2019-02-06 ENCOUNTER — Other Ambulatory Visit: Payer: Self-pay

## 2019-02-06 ENCOUNTER — Encounter: Payer: Self-pay | Admitting: Endocrinology

## 2019-02-06 DIAGNOSIS — D352 Benign neoplasm of pituitary gland: Secondary | ICD-10-CM | POA: Diagnosis not present

## 2019-02-06 DIAGNOSIS — I2510 Atherosclerotic heart disease of native coronary artery without angina pectoris: Secondary | ICD-10-CM | POA: Diagnosis not present

## 2019-02-06 NOTE — Progress Notes (Signed)
Patient ID: Jay Day, male   DOB: 1940-08-20, 78 y.o.   MRN: [id number]          Chief complaint:  Follow-up of prolactinoma tumor  Today's office visit was provided via telemedicine using video technique The patient was explained the limitations of evaluation and management by telemedicine and the availability of in person appointments.  The patient understood the limitations and agreed to proceed. Patient also understood that the telehealth visit is billable. . Location of the patient: Patient's home . Location of the provider: Physician office Only the patient and myself were participating in the encounter    History of Present Illness     Prior history: Apparently in 2003 he was being evaluated by his PCP for complaints of decreased libido. 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   He was treated by his internist with bromocriptine.  Follow-up MRI in 04/2012 did not show any change in size of the tumor  Because of his relatively low prolactin level with 2.5 mg of bromocriptine he was tried on half tablet in 2012 and prolactin was normal at 5.2 subsequently He had been taking 2.5 mg again for some time but his prolactin level in 11/2011 was only 2.0, in 7/14 was 1.9 and in 01/2014 his prolactin level was low at 1.6   RECENT HISTORY:  .  Patient has been treated with low doses of bromocriptine which he takes at night   No evidence of hypopituitarism on his evaluation in 10/2014 MRI brain scan in 06/2016 showed:   T2 hyperintense focus centered within the right aspect of the pituitary gland and cavernous sinus best appreciated on the coronal sequence is decreased in size from the 2003 pituitary MRI of the brain and probably represents residual adenoma.  When he was not  taking bromocriptine  his prolactin went up to 31  With the prolactin level going back again down to 3.5 in 11/17 he was changed from a half a tablet of bromocriptine daily down to a half tablet 3 times a week His prolactin level was tending to be over 15 since 9/18 Since then he has been taking bromocriptine 1.25 mg 4 days a week  He is not having problems with reduced libido, headaches, visual changes.  Feels good and is active  Prolactin now is again normal although slightly higher than before at 10.4  Testosterone level is normal  Free T4 has been recently consistently normal  Lab Results  Component Value Date   PROLACTIN 10.4 02/04/2019   PROLACTIN 4.3 08/03/2018   PROLACTIN 10.7 06/25/2018   PROLACTIN 4.4 12/19/2017   PROLACTIN 15.6 (H) 08/18/2017   PROLACTIN 15.9 (H) 02/17/2017   PROLACTIN 3.5 (L) 04/18/2016   PROLACTIN 18.2 (H) 10/16/2015    Lab Results  Component Value Date   TSH 1.29 07/10/2018   FREET4 1.04 02/04/2019   FREET4 0.92 12/19/2017   FREET4 0.68 08/18/2017     Allergies as of 02/06/2019      Reactions   Gluten Meal Other (See Comments)   Gluten Sensitivity (reaction undefined) NO BREAD   Wheat Bran Other (See Comments)   Reaction undefined      Medication List       Accurate as of February 06, 2019  9:27 AM.  If you have any questions, ask your nurse or doctor.        STOP taking these medications   Melatonin 3 MG Tabs Stopped by: Elayne Snare, MD     TAKE these medications   bromocriptine 2.5 MG tablet Commonly known as: PARLODEL TAKE 1/2 TABLET BY MOUTH 4 TIMES A WEEK   latanoprost 0.005 % ophthalmic solution Commonly known as: XALATAN Place 1 drop into both eyes at bedtime.   metoprolol succinate 25 MG 24 hr tablet Commonly known as: TOPROL-XL Take 12.5 mg by mouth daily.   PROBIOTIC ADVANCED PO Take 1 capsule by mouth daily.   telmisartan 40 MG tablet Commonly known as: Micardis Take 1 tablet (40 mg total) by mouth daily.    warfarin 5 MG tablet Commonly known as: COUMADIN Start from tomorrow       Allergies:  Allergies  Allergen Reactions  . Gluten Meal Other (See Comments)    Gluten Sensitivity (reaction undefined) NO BREAD  . Wheat Bran Other (See Comments)    Reaction undefined    Past Medical History:  Diagnosis Date  . Asthma    hx of as child  . Bronchitis    as a child  . Cataract   . Chronic anticoagulation 06/11/2013  . Clotting disorder ([place])   . 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 Elevated PSA    being monitored by physician  . Glaucoma 10/13/2011  . 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 Headache disorder 02/24/2015   occ  . Hypertension    sees [name], primary   . MI (myocardial infarction) ([place]) not sure when   scar tissue saw  . Peyronie's disease   . Pituitary adenoma ([place]) 10/13/2011  . Pituitary tumor    takes medication to manage  . Prostatitis   . Pulmonary embolism ([place]) yrs ago  . Rash    last 3 months chest and arms, saw md no tx given  . 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 Stroke [facility]) 2017   mild cva    Past Surgical History:  Procedure Laterality Date  . COLONOSCOPY N/A 08/22/2016   Procedure: COLONOSCOPY;  Surgeon: Garlan Fair, MD;  Location: [facility];  Service: Endoscopy;  Laterality: N/A;  . colonscopy  2007  . EYE SURGERY     bilateral cataract surgery  . HERNIA REPAIR  1960  . INGUINAL HERNIA REPAIR  08/12/2011   Procedure: LAPAROSCOPIC INGUINAL HERNIA;  Surgeon: [name], MD;  Location: [place];  Service: General;  Laterality: Left;  . [place]   left   . LEFT HEART CATHETERIZATION WITH CORONARY ANGIOGRAM N/A 05/06/2014   Procedure: LEFT HEART CATHETERIZATION WITH CORONARY ANGIOGRAM;  Surgeon: [name], MD;   Location: [facility] CATH LAB;  Service: Cardiovascular;  Laterality: N/A;  . TONSILLECTOMY     as a child    Family History  Problem Relation Age of Onset  . Pulmonary embolism Mother   . Kidney failure Mother   . Hypertension Mother   . Kidney Stones Mother   . Pulmonary embolism Father   . Prostate cancer Father   . Pulmonary embolism Maternal Grandmother   . Pulmonary embolism Maternal Grandfather   . Pulmonary embolism Paternal Grandmother   . Pulmonary embolism  Paternal Grandfather     Social History:  reports that he quit smoking about 48 years ago. His smoking use included cigarettes. He has a 1.50 pack-year smoking history. He has never used smokeless tobacco. He reports that he does not drink alcohol or use drugs.  ROS  He is still on captopril for hypertension  General Examination:   There were no vitals taken for this visit.    Assessment/ Plan:  PROLACTINOMA: He had a 1.4 cm pituitary tumor at baseline and his initial prolactin level was only moderately increased at 99  His baseline symptoms were mostly related to decreased libido  Has been well controlled with low doses of bromocriptine, has been on treatment for several years now  Currently on the bromocriptine 1.25 mg 4 times a week Prolactin is consistently normal and now about 10  No complaints of headaches or decreased libido His testosterone level and free T4 are normal again He may have a relatively high TBG causing his testosterone to be on the high side of normal   Recommendations: He will be given a trial of stopping bromocriptine for 2 months  Discussed that if he is able to stop his bromocriptine and his prolactin level is not significantly high he can leave it off long-term  Elayne Snare 02/06/2019, 9:27 AM

## 2019-02-07 DIAGNOSIS — H401132 Primary open-angle glaucoma, bilateral, moderate stage: Secondary | ICD-10-CM | POA: Diagnosis not present

## 2019-02-11 ENCOUNTER — Other Ambulatory Visit: Payer: Self-pay

## 2019-02-11 DIAGNOSIS — Z20822 Contact with and (suspected) exposure to covid-19: Secondary | ICD-10-CM

## 2019-02-11 DIAGNOSIS — R6889 Other general symptoms and signs: Secondary | ICD-10-CM | POA: Diagnosis not present

## 2019-02-12 LAB — NOVEL CORONAVIRUS, NAA: SARS-CoV-2, NAA: NOT DETECTED

## 2019-02-27 DIAGNOSIS — Z23 Encounter for immunization: Secondary | ICD-10-CM | POA: Diagnosis not present

## 2019-02-27 DIAGNOSIS — Z7901 Long term (current) use of anticoagulants: Secondary | ICD-10-CM | POA: Diagnosis not present

## 2019-02-27 DIAGNOSIS — R791 Abnormal coagulation profile: Secondary | ICD-10-CM | POA: Diagnosis not present

## 2019-02-27 DIAGNOSIS — Z86711 Personal history of pulmonary embolism: Secondary | ICD-10-CM | POA: Diagnosis not present

## 2019-03-27 DIAGNOSIS — Z7901 Long term (current) use of anticoagulants: Secondary | ICD-10-CM | POA: Diagnosis not present

## 2019-04-24 ENCOUNTER — Other Ambulatory Visit (INDEPENDENT_AMBULATORY_CARE_PROVIDER_SITE_OTHER): Payer: Medicare Other

## 2019-04-24 ENCOUNTER — Other Ambulatory Visit: Payer: Self-pay

## 2019-04-24 DIAGNOSIS — D352 Benign neoplasm of pituitary gland: Secondary | ICD-10-CM

## 2019-04-25 LAB — PROLACTIN: Prolactin: 43.6 ng/mL — ABNORMAL HIGH (ref 4.0–15.2)

## 2019-04-29 ENCOUNTER — Other Ambulatory Visit: Payer: Self-pay

## 2019-04-29 ENCOUNTER — Ambulatory Visit (INDEPENDENT_AMBULATORY_CARE_PROVIDER_SITE_OTHER): Payer: Medicare Other | Admitting: Endocrinology

## 2019-04-29 ENCOUNTER — Encounter: Payer: Self-pay | Admitting: Endocrinology

## 2019-04-29 DIAGNOSIS — D352 Benign neoplasm of pituitary gland: Secondary | ICD-10-CM | POA: Diagnosis not present

## 2019-04-29 DIAGNOSIS — I2510 Atherosclerotic heart disease of native coronary artery without angina pectoris: Secondary | ICD-10-CM | POA: Diagnosis not present

## 2019-04-29 MED ORDER — BROMOCRIPTINE MESYLATE 2.5 MG PO TABS: ORAL_TABLET | ORAL | 3 refills | Status: DC

## 2019-04-29 NOTE — Progress Notes (Signed)
Patient ID: Jay Day, male   DOB: 01-21-1941, 78 y.o.   MRN: [id number]          Chief complaint:  Follow-up of prolactinoma tumor  Today's office visit was provided via telemedicine using video technique The patient was explained the limitations of evaluation and management by telemedicine and the availability of in person appointments.  The patient understood the limitations and agreed to proceed. Patient also understood that the telehealth visit is billable.  Location of the patient: Patient's home  Location of the provider: Physician office Only the patient and myself were participating in the encounter    History of Present Illness     Prior history: Apparently in 2003 he was being evaluated by his PCP for complaints of decreased libido. 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   He was treated by his internist with bromocriptine.  Because of his relatively low prolactin level with 2.5 mg of bromocriptine he was tried on half tablet in 2012 and prolactin was normal at 5.2 subsequently He had been taking 2.5 mg again for some time but his prolactin level in 11/2011 was only 2.0, in 7/14 was 1.9 and in 01/2014 his prolactin level was low at 1.6   Follow-up MRI in 04/2012 did not show any change in size of the tumor   RECENT HISTORY:  .  Patient has been treated with low doses of bromocriptine which he takes at night   No evidence of hypopituitarism on his evaluation in 10/2014 MRI brain scan done for unrelated reasons in 06/2016 showed:   T2 hyperintense focus centered within the right aspect of the pituitary gland and cavernous sinus best appreciated on the coronal sequence is decreased in size from the 2003 pituitary MRI of the brain and probably represents residual  adenoma.  When he was not taking bromocriptine his prolactin went up to 31  With the prolactin level going back again down to 3.5 in 11/17 he was changed from a half a tablet of bromocriptine daily down to a half tablet 3 times a week His prolactin level was slightly higher over 15 since 9/18 Since then he had been taking bromocriptine 1.25 mg 4 days a week  He is now off bromocriptine as a trial without medication to see if he is in remission with his prolactinoma  Does not complain of reduced libido, headaches or fatigue  Prolactin now is however higher at 43.6 compared to 10.4  Testosterone level previously normal  Free T4 has been previously consistently normal  Lab Results  Component Value Date   PROLACTIN 43.6 (H) 04/24/2019   PROLACTIN 10.4 02/04/2019   PROLACTIN 4.3 08/03/2018   PROLACTIN 10.7 06/25/2018   PROLACTIN 4.4 12/19/2017   PROLACTIN 15.6 (H) 08/18/2017   PROLACTIN 15.9 (H) 02/17/2017   PROLACTIN 3.5 (L) 04/18/2016    Lab Results  Component Value Date   TSH 1.29 07/10/2018   FREET4 1.04 02/04/2019   FREET4 0.92 12/19/2017   FREET4 0.68 08/18/2017     Allergies as of 04/29/2019      Reactions   Gluten Meal Other (See Comments)   Gluten Sensitivity (reaction undefined) NO BREAD   Wheat Bran Other (See Comments)   Reaction undefined      Medication List  Accurate as of April 29, 2019  2:04 PM. If you have any questions, ask your nurse or doctor.        bromocriptine 2.5 MG tablet Commonly known as: PARLODEL TAKE 1/2 TABLET BY MOUTH 4 TIMES A WEEK   latanoprost 0.005 % ophthalmic solution Commonly known as: XALATAN Place 1 drop into both eyes at bedtime.   metoprolol succinate 25 MG 24 hr tablet Commonly known as: TOPROL-XL Take 12.5 mg by mouth daily.   PROBIOTIC ADVANCED PO Take 1 capsule by mouth daily.   telmisartan 40 MG tablet Commonly known as: Micardis Take 1 tablet (40 mg total) by mouth daily.   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) 2017   mild cva    Past Surgical History:  Procedure Laterality Date   COLONOSCOPY N/A 08/22/2016   Procedure: COLONOSCOPY;  Surgeon: Garlan Fair, MD;  Location: [facility];  Service: Endoscopy;  Laterality: N/A;   colonscopy  2007   EYE SURGERY     bilateral cataract surgery   HERNIA REPAIR  1960   INGUINAL HERNIA REPAIR  08/12/2011   Procedure: LAPAROSCOPIC INGUINAL HERNIA;  Surgeon: [name], MD;  Location: [place];  Service: General;  Laterality: Left;   [place]   left    LEFT HEART CATHETERIZATION WITH CORONARY ANGIOGRAM N/A 05/06/2014   Procedure: LEFT HEART CATHETERIZATION WITH CORONARY ANGIOGRAM;  Surgeon: [name], MD;  Location: [facility]  CATH LAB;  Service: Cardiovascular;  Laterality: N/A;   TONSILLECTOMY     as a child    Family History  Problem Relation Age of Onset   Pulmonary embolism Mother    Kidney failure Mother    Hypertension Mother    Kidney Stones Mother    Pulmonary embolism Father    Prostate cancer Father    Pulmonary embolism Maternal Grandmother    Pulmonary embolism Maternal Grandfather    Pulmonary embolism Paternal Grandmother    Pulmonary embolism Paternal Grandfather     Social History:  reports that he quit smoking about  48 years ago. His smoking use included cigarettes. He has a 1.50 pack-year smoking history. He has never used smokeless tobacco. He reports that he does not drink alcohol or use drugs.  ROS  Followed by PCP for hypertension, now on telmisartan Last blood pressure 124/72  General Examination:   There were no vitals taken for this visit.    Assessment/ Plan:  PROLACTINOMA: He had a 1.4 cm pituitary tumor at baseline in 2003 and his initial prolactin level was high at 99  His baseline symptoms were mostly related to decreased libido  Had been well controlled with low doses of bromocriptine for several years now Was last on bromocriptine 1.25 mg 4 times a week  With a trial of stopping his bromocriptine his prolactin has gone up to 43.6 compared to 10.4 He is however asymptomatic still  His testosterone level and free T4 previously normal He may have a relatively high TBG causing his testosterone to be on the high side of normal   Recommendations: 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

## 2019-05-08 ENCOUNTER — Other Ambulatory Visit: Payer: Self-pay | Admitting: Internal Medicine

## 2019-05-08 DIAGNOSIS — C61 Malignant neoplasm of prostate: Secondary | ICD-10-CM | POA: Diagnosis not present

## 2019-05-08 DIAGNOSIS — D6859 Other primary thrombophilia: Secondary | ICD-10-CM | POA: Diagnosis not present

## 2019-05-08 DIAGNOSIS — Z86711 Personal history of pulmonary embolism: Secondary | ICD-10-CM | POA: Diagnosis not present

## 2019-05-08 DIAGNOSIS — Z7901 Long term (current) use of anticoagulants: Secondary | ICD-10-CM | POA: Diagnosis not present

## 2019-05-08 DIAGNOSIS — J320 Chronic maxillary sinusitis: Secondary | ICD-10-CM | POA: Diagnosis not present

## 2019-05-08 DIAGNOSIS — Z136 Encounter for screening for cardiovascular disorders: Secondary | ICD-10-CM | POA: Diagnosis not present

## 2019-05-08 DIAGNOSIS — Z0000 Encounter for general adult medical examination without abnormal findings: Secondary | ICD-10-CM | POA: Diagnosis not present

## 2019-05-08 DIAGNOSIS — I6930 Unspecified sequelae of cerebral infarction: Secondary | ICD-10-CM | POA: Diagnosis not present

## 2019-05-08 DIAGNOSIS — I10 Essential (primary) hypertension: Secondary | ICD-10-CM | POA: Diagnosis not present

## 2019-05-08 DIAGNOSIS — Z8673 Personal history of transient ischemic attack (TIA), and cerebral infarction without residual deficits: Secondary | ICD-10-CM | POA: Diagnosis not present

## 2019-05-08 DIAGNOSIS — Z8701 Personal history of pneumonia (recurrent): Secondary | ICD-10-CM | POA: Diagnosis not present

## 2019-05-08 DIAGNOSIS — D352 Benign neoplasm of pituitary gland: Secondary | ICD-10-CM | POA: Diagnosis not present

## 2019-05-08 MED ORDER — TELMISARTAN 40 MG PO TABS: 40.0000 mg | ORAL_TABLET | Freq: Every day | ORAL | Status: DC

## 2019-05-13 ENCOUNTER — Other Ambulatory Visit: Payer: Self-pay | Admitting: Internal Medicine

## 2019-05-14 DIAGNOSIS — Z961 Presence of intraocular lens: Secondary | ICD-10-CM | POA: Diagnosis not present

## 2019-05-14 DIAGNOSIS — H401131 Primary open-angle glaucoma, bilateral, mild stage: Secondary | ICD-10-CM | POA: Diagnosis not present

## 2019-05-23 DIAGNOSIS — I10 Essential (primary) hypertension: Secondary | ICD-10-CM | POA: Diagnosis not present

## 2019-06-18 DIAGNOSIS — Z7901 Long term (current) use of anticoagulants: Secondary | ICD-10-CM | POA: Diagnosis not present

## 2019-06-18 DIAGNOSIS — N1831 Chronic kidney disease, stage 3a: Secondary | ICD-10-CM | POA: Diagnosis not present

## 2019-06-18 DIAGNOSIS — N289 Disorder of kidney and ureter, unspecified: Secondary | ICD-10-CM | POA: Diagnosis not present

## 2019-06-18 DIAGNOSIS — I10 Essential (primary) hypertension: Secondary | ICD-10-CM | POA: Diagnosis not present

## 2019-06-19 DIAGNOSIS — N179 Acute kidney failure, unspecified: Secondary | ICD-10-CM | POA: Diagnosis not present

## 2019-06-19 DIAGNOSIS — I129 Hypertensive chronic kidney disease with stage 1 through stage 4 chronic kidney disease, or unspecified chronic kidney disease: Secondary | ICD-10-CM | POA: Diagnosis not present

## 2019-06-24 ENCOUNTER — Other Ambulatory Visit: Payer: Self-pay | Admitting: Nephrology

## 2019-06-24 DIAGNOSIS — N179 Acute kidney failure, unspecified: Secondary | ICD-10-CM

## 2019-06-24 DIAGNOSIS — I129 Hypertensive chronic kidney disease with stage 1 through stage 4 chronic kidney disease, or unspecified chronic kidney disease: Secondary | ICD-10-CM

## 2019-06-25 ENCOUNTER — Ambulatory Visit
Admission: RE | Admit: 2019-06-25 | Discharge: 2019-06-25 | Disposition: A | Discharge status: Home / Self Care | Payer: Medicare Other | Source: Ambulatory Visit | Attending: Nephrology | Admitting: Nephrology

## 2019-06-25 DIAGNOSIS — N1330 Unspecified hydronephrosis: Secondary | ICD-10-CM | POA: Diagnosis not present

## 2019-06-25 DIAGNOSIS — N179 Acute kidney failure, unspecified: Secondary | ICD-10-CM

## 2019-06-25 DIAGNOSIS — I129 Hypertensive chronic kidney disease with stage 1 through stage 4 chronic kidney disease, or unspecified chronic kidney disease: Secondary | ICD-10-CM

## 2019-07-02 DIAGNOSIS — Z7901 Long term (current) use of anticoagulants: Secondary | ICD-10-CM | POA: Diagnosis not present

## 2019-07-02 DIAGNOSIS — N1339 Other hydronephrosis: Secondary | ICD-10-CM | POA: Diagnosis not present

## 2019-07-02 DIAGNOSIS — R791 Abnormal coagulation profile: Secondary | ICD-10-CM | POA: Diagnosis not present

## 2019-07-02 DIAGNOSIS — I10 Essential (primary) hypertension: Secondary | ICD-10-CM | POA: Diagnosis not present

## 2019-07-03 DIAGNOSIS — N1330 Unspecified hydronephrosis: Secondary | ICD-10-CM | POA: Diagnosis not present

## 2019-07-03 DIAGNOSIS — R338 Other retention of urine: Secondary | ICD-10-CM | POA: Diagnosis not present

## 2019-07-03 DIAGNOSIS — N401 Enlarged prostate with lower urinary tract symptoms: Secondary | ICD-10-CM | POA: Diagnosis not present

## 2019-07-05 ENCOUNTER — Emergency Department (HOSPITAL_COMMUNITY): Payer: Medicare Other

## 2019-07-05 ENCOUNTER — Inpatient Hospital Stay (HOSPITAL_COMMUNITY)
Admission: EM | Admit: 2019-07-05 | Discharge: 2019-07-10 | DRG: 698 | Disposition: A | Discharge status: Home / Self Care | Payer: Medicare Other | Attending: Internal Medicine | Admitting: Internal Medicine

## 2019-07-05 ENCOUNTER — Other Ambulatory Visit: Payer: Self-pay

## 2019-07-05 DIAGNOSIS — Z91018 Allergy to other foods: Secondary | ICD-10-CM

## 2019-07-05 DIAGNOSIS — Y846 Urinary catheterization as the cause of abnormal reaction of the patient, or of later complication, without mention of misadventure at the time of the procedure: Secondary | ICD-10-CM | POA: Diagnosis present

## 2019-07-05 DIAGNOSIS — N136 Pyonephrosis: Secondary | ICD-10-CM | POA: Diagnosis present

## 2019-07-05 DIAGNOSIS — G9341 Metabolic encephalopathy: Secondary | ICD-10-CM | POA: Diagnosis present

## 2019-07-05 DIAGNOSIS — Z7901 Long term (current) use of anticoagulants: Secondary | ICD-10-CM

## 2019-07-05 DIAGNOSIS — T83518A Infection and inflammatory reaction due to other urinary catheter, initial encounter: Principal | ICD-10-CM | POA: Diagnosis present

## 2019-07-05 DIAGNOSIS — Z8042 Family history of malignant neoplasm of prostate: Secondary | ICD-10-CM

## 2019-07-05 DIAGNOSIS — Z8249 Family history of ischemic heart disease and other diseases of the circulatory system: Secondary | ICD-10-CM

## 2019-07-05 DIAGNOSIS — N3001 Acute cystitis with hematuria: Secondary | ICD-10-CM | POA: Diagnosis not present

## 2019-07-05 DIAGNOSIS — Z20822 Contact with and (suspected) exposure to covid-19: Secondary | ICD-10-CM | POA: Diagnosis present

## 2019-07-05 DIAGNOSIS — R338 Other retention of urine: Secondary | ICD-10-CM | POA: Diagnosis not present

## 2019-07-05 DIAGNOSIS — R410 Disorientation, unspecified: Secondary | ICD-10-CM | POA: Diagnosis not present

## 2019-07-05 DIAGNOSIS — N401 Enlarged prostate with lower urinary tract symptoms: Secondary | ICD-10-CM | POA: Diagnosis present

## 2019-07-05 DIAGNOSIS — R58 Hemorrhage, not elsewhere classified: Secondary | ICD-10-CM | POA: Diagnosis not present

## 2019-07-05 DIAGNOSIS — R310 Gross hematuria: Secondary | ICD-10-CM | POA: Diagnosis present

## 2019-07-05 DIAGNOSIS — N179 Acute kidney failure, unspecified: Secondary | ICD-10-CM | POA: Diagnosis present

## 2019-07-05 DIAGNOSIS — I252 Old myocardial infarction: Secondary | ICD-10-CM

## 2019-07-05 DIAGNOSIS — N390 Urinary tract infection, site not specified: Secondary | ICD-10-CM | POA: Diagnosis present

## 2019-07-05 DIAGNOSIS — D352 Benign neoplasm of pituitary gland: Secondary | ICD-10-CM | POA: Diagnosis present

## 2019-07-05 DIAGNOSIS — Z86008 Personal history of in-situ neoplasm of other site: Secondary | ICD-10-CM

## 2019-07-05 DIAGNOSIS — C61 Malignant neoplasm of prostate: Secondary | ICD-10-CM | POA: Diagnosis not present

## 2019-07-05 DIAGNOSIS — Z8673 Personal history of transient ischemic attack (TIA), and cerebral infarction without residual deficits: Secondary | ICD-10-CM

## 2019-07-05 DIAGNOSIS — R509 Fever, unspecified: Secondary | ICD-10-CM | POA: Diagnosis not present

## 2019-07-05 DIAGNOSIS — I825Z2 Chronic embolism and thrombosis of unspecified deep veins of left distal lower extremity: Secondary | ICD-10-CM | POA: Diagnosis not present

## 2019-07-05 DIAGNOSIS — B952 Enterococcus as the cause of diseases classified elsewhere: Secondary | ICD-10-CM | POA: Diagnosis present

## 2019-07-05 DIAGNOSIS — R404 Transient alteration of awareness: Secondary | ICD-10-CM | POA: Diagnosis not present

## 2019-07-05 DIAGNOSIS — Z841 Family history of disorders of kidney and ureter: Secondary | ICD-10-CM

## 2019-07-05 DIAGNOSIS — H409 Unspecified glaucoma: Secondary | ICD-10-CM | POA: Diagnosis present

## 2019-07-05 DIAGNOSIS — G9340 Encephalopathy, unspecified: Secondary | ICD-10-CM | POA: Diagnosis present

## 2019-07-05 DIAGNOSIS — Z79899 Other long term (current) drug therapy: Secondary | ICD-10-CM

## 2019-07-05 DIAGNOSIS — I499 Cardiac arrhythmia, unspecified: Secondary | ICD-10-CM | POA: Diagnosis not present

## 2019-07-05 DIAGNOSIS — Z87891 Personal history of nicotine dependence: Secondary | ICD-10-CM

## 2019-07-05 DIAGNOSIS — N1330 Unspecified hydronephrosis: Secondary | ICD-10-CM | POA: Diagnosis not present

## 2019-07-05 DIAGNOSIS — R0902 Hypoxemia: Secondary | ICD-10-CM | POA: Diagnosis not present

## 2019-07-05 DIAGNOSIS — Z86711 Personal history of pulmonary embolism: Secondary | ICD-10-CM | POA: Diagnosis present

## 2019-07-05 DIAGNOSIS — I10 Essential (primary) hypertension: Secondary | ICD-10-CM | POA: Diagnosis present

## 2019-07-05 NOTE — ED Provider Notes (Signed)
[facility] Emergency Department Provider Note MRN:  [id number]  Arrival date & time: 07/06/19     Chief Complaint   Fever and urinary cath removal   History of Present Illness   Jay Day is a 79 y.o. year-old male with a history of PE, prostate enlargement presenting to the ED with chief complaint of fever.  Patient reporting fever, reports that a Foley catheter was placed today but it was very uncomfortable and so he removed it.  Patient is confused.  I was unable to obtain an accurate HPI, PMH, or ROS due to the patient's altered mental status.  Level 5 caveat.  Review of Systems  Positive for confusion, fever, Foley catheter issue.  Patient's Health History    Past Medical History:  Diagnosis Date  . Asthma    hx of as child  . Bronchitis    as a child  . Cataract   . Chronic anticoagulation 06/11/2013  . Clotting disorder ([place])   . 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 Elevated PSA    being monitored by physician  . Glaucoma 10/13/2011  . 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 Headache disorder 02/24/2015   occ  . Hypertension    sees [name], primary   . MI (myocardial infarction) ([place]) not sure when   scar tissue saw  . Peyronie's disease   . 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 Pituitary tumor    takes medication to manage  . Prostatitis   . Pulmonary embolism ([place]) yrs ago  . Rash    last 3 months chest and arms, saw md no tx given  . 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 Stroke [facility]) 2017   mild cva    Past Surgical History:  Procedure Laterality Date  . COLONOSCOPY N/A 08/22/2016   Procedure: COLONOSCOPY;  Surgeon: Garlan Fair, MD;  Location: [facility] ENDOSCOPY;  Service: Endoscopy;  Laterality: N/A;  .  colonscopy  2007  . EYE SURGERY     bilateral cataract surgery  . HERNIA REPAIR  1960  . INGUINAL HERNIA REPAIR  08/12/2011   Procedure: LAPAROSCOPIC INGUINAL HERNIA;  Surgeon: [name], MD;  Location: [facility];  Service: General;  Laterality: Left;  . [place]   left   . LEFT HEART CATHETERIZATION WITH CORONARY ANGIOGRAM N/A 05/06/2014   Procedure: LEFT HEART CATHETERIZATION WITH CORONARY ANGIOGRAM;  Surgeon: [name], MD;  Location: [facility] CATH LAB;  Service: Cardiovascular;  Laterality: N/A;  . TONSILLECTOMY     as a child    Family History  Problem Relation Age of Onset  . Pulmonary embolism Mother   . Kidney failure Mother   . Hypertension Mother   . Kidney Stones Mother   . Pulmonary embolism Father   . Prostate cancer Father   . Pulmonary embolism Maternal Grandmother   . Pulmonary embolism Maternal Grandfather   . Pulmonary embolism Paternal Grandmother   . Pulmonary embolism Paternal Grandfather     Social History   Socioeconomic History  . Marital status: Married    Spouse name: Not on file  . Number of children: 2  . Years of education: BA  . Highest education level: Not on file  Occupational History  . Occupation: Designer, fashion/clothing  [employer]  Tobacco Use  . Smoking status: Former Smoker    Packs/day: 0.30    Years: 5.00    Pack years: 1.50    Types: Cigarettes    Quit date: 06/13/1970    Years since quitting: 49.0  . Smokeless tobacco: Never Used  Substance and Sexual Activity  . Alcohol use: No    Alcohol/week: 0.0 standard drinks  . Drug use: No  . Sexual activity: Not on file  Other Topics Concern  . Not on file  Social History Narrative   Patient drinks about 2 cups of caffeine daily.   Patient is left handed.   Social Determinants of Health   Financial Resource Strain:   . Difficulty of Paying Living Expenses: Not on file  Food Insecurity:   . Worried About Charity fundraiser in the Last Year: Not on file  . Ran Out of Food in the Last  Year: Not on file  Transportation Needs:   . Lack of Transportation (Medical): Not on file  . Lack of Transportation (Non-Medical): Not on file  Physical Activity:   . Days of Exercise per Week: Not on file  . Minutes of Exercise per Session: Not on file  Stress:   . Feeling of Stress : Not on file  Social Connections:   . Frequency of Communication with Friends and Family: Not on file  . Frequency of Social Gatherings with Friends and Family: Not on file  . Attends Religious Services: Not on file  . Active Member of Clubs or Organizations: Not on file  . Attends Archivist Meetings: Not on file  . Marital Status: Not on file  Intimate Partner Violence:   . Fear of Current or Ex-Partner: Not on file  . Emotionally Abused: Not on file  . Physically Abused: Not on file  . Sexually Abused: Not on file     Physical Exam   Vitals:   07/05/19 2314 07/06/19 0110  BP: 139/71 117/78  Pulse: 79 70  Resp: (!) 24 20  Temp: (!) 101.1 F (38.4 C)   SpO2: 97% 100%    CONSTITUTIONAL: Well-appearing, NAD NEURO:  Alert and oriented to name and place, no focal neurological deficits, seems mildly confused EYES:  eyes equal and reactive ENT/NECK:  no LAD, no JVD CARDIO: Regular rate, well-perfused, normal S1 and S2 PULM:  CTAB no wheezing or rhonchi GI/GU:  normal bowel sounds, non-distended, non-tender MSK/SPINE:  No gross deformities, no edema SKIN:  no rash, atraumatic PSYCH:  Appropriate speech and behavior  *Additional and/or pertinent findings included in MDM below  Diagnostic and Interventional Summary    EKG Interpretation  Date/Time:    Ventricular Rate:    PR Interval:    QRS Duration:   QT Interval:    QTC Calculation:   R Axis:     Text Interpretation:        Labs Reviewed  CBC - Abnormal; Notable for the following components:      Result Value   RBC 3.53 (*)    Hemoglobin 12.1 (*)    HCT 35.8 (*)    MCV 101.4 (*)    MCH 34.3 (*)    Platelets 113  (*)    All other components within normal limits  COMPREHENSIVE METABOLIC PANEL - Abnormal; Notable for the following components:   Chloride 113 (*)    Glucose, Bld 157 (*)    BUN 41 (*)    Creatinine, Ser 2.56 (*)  Total Bilirubin 1.4 (*)    GFR calc non Af Amer 23 (*)    GFR calc Af Amer 27 (*)    All other components within normal limits  URINALYSIS, ROUTINE W REFLEX MICROSCOPIC - Abnormal; Notable for the following components:   Color, Urine RED (*)    APPearance CLOUDY (*)    Hgb urine dipstick LARGE (*)    Protein, ur 100 (*)    Leukocytes,Ua MODERATE (*)    All other components within normal limits  CULTURE, BLOOD (ROUTINE X 2)  CULTURE, BLOOD (ROUTINE X 2)  URINE CULTURE  SARS CORONAVIRUS 2 (TAT 6-24 HRS)  LACTIC ACID, PLASMA  CBC  BASIC METABOLIC PANEL  PROTIME-INR  POC SARS CORONAVIRUS 2 AG -  ED    US RENAL  Final Result    DG Chest Port 1 View  Final Result      Medications  0.9 %  sodium chloride infusion (has no administration in time range)  bromocriptine (PARLODEL) tablet 1.25 mg (has no administration in time range)  dorzolamide-timolol (COSOPT) 22.3-6.8 MG/ML ophthalmic solution 1 drop (has no administration in time range)  latanoprost (XALATAN) 0.005 % ophthalmic solution 1 drop (has no administration in time range)  multivitamin with minerals tablet 1 tablet (has no administration in time range)  oxybutynin (DITROPAN) tablet 5-10 mg (has no administration in time range)  acetaminophen (TYLENOL) tablet 650 mg (has no administration in time range)    Or  acetaminophen (TYLENOL) suppository 650 mg (has no administration in time range)  ondansetron (ZOFRAN) tablet 4 mg (has no administration in time range)    Or  ondansetron (ZOFRAN) injection 4 mg (has no administration in time range)  ceFEPIme (MAXIPIME) 2 g in sodium chloride 0.9 % 100 mL IVPB (has no administration in time range)  sodium chloride 0.9 % bolus 1,000 mL (0 mLs Intravenous Stopped  07/06/19 0235)  haloperidol lactate (HALDOL) injection 2 mg (2 mg Intravenous Given 07/06/19 0113)  cefTRIAXone (ROCEPHIN) 2 g in sodium chloride 0.9 % 100 mL IVPB (0 g Intravenous Stopped 07/06/19 0235)     Procedures  /  Critical Care Procedures  ED Course and Medical Decision Making  I have reviewed the triage vital signs, the nursing notes, and pertinent available records from the EMR.  Pertinent labs & imaging results that were available during my care of the patient were reviewed by me and considered in my medical decision making (see below for details).     Fever, question of altered mental status reported by family, patient is not aware whether or not he has a catheter in his urethra.  Otherwise is well-appearing, febrile but with a normal vital signs otherwise.  Infectious work-up pending.  Labs reveal AKI.  Patient with worsening confusion here in the emergency department.  Suspect UTI causing confusion.  Admitted to hospital service for further care.  Barth Kirks. Sedonia Small, [place] [email or web address]  Final Clinical Impressions(s) / ED Diagnoses     ICD-10-CM   1. Acute cystitis with hematuria  N30.01   2. AKI (acute kidney injury) ([place])  N17.9 US RENAL    US RENAL    ED Discharge Orders    None       Discharge Instructions Discussed with and Provided to Patient:   Discharge Instructions   None       Maudie Flakes, MD 07/06/19 [phone]

## 2019-07-05 NOTE — ED Triage Notes (Signed)
Per EMS - Pt coming form home with complaints for self removal of urinary catheter. Previously pt did self cath, today had a foley put in by PCP. Upon arrival, EMS found pt to have 105.0 fever. Pt seems confused.   150 80 90 HR Sinus arrhythmia 97% RA CBG 138 26 R  20 Lt hand   100 mg tylenol

## 2019-07-06 ENCOUNTER — Encounter (HOSPITAL_COMMUNITY): Payer: Self-pay | Admitting: Internal Medicine

## 2019-07-06 ENCOUNTER — Inpatient Hospital Stay (HOSPITAL_COMMUNITY): Payer: Medicare Other

## 2019-07-06 ENCOUNTER — Other Ambulatory Visit: Payer: Self-pay

## 2019-07-06 DIAGNOSIS — G9341 Metabolic encephalopathy: Secondary | ICD-10-CM | POA: Diagnosis present

## 2019-07-06 DIAGNOSIS — N390 Urinary tract infection, site not specified: Secondary | ICD-10-CM | POA: Diagnosis present

## 2019-07-06 DIAGNOSIS — D352 Benign neoplasm of pituitary gland: Secondary | ICD-10-CM

## 2019-07-06 DIAGNOSIS — N179 Acute kidney failure, unspecified: Secondary | ICD-10-CM | POA: Diagnosis not present

## 2019-07-06 DIAGNOSIS — Z86711 Personal history of pulmonary embolism: Secondary | ICD-10-CM | POA: Diagnosis not present

## 2019-07-06 DIAGNOSIS — Y846 Urinary catheterization as the cause of abnormal reaction of the patient, or of later complication, without mention of misadventure at the time of the procedure: Secondary | ICD-10-CM | POA: Diagnosis present

## 2019-07-06 DIAGNOSIS — Z20822 Contact with and (suspected) exposure to covid-19: Secondary | ICD-10-CM | POA: Diagnosis present

## 2019-07-06 DIAGNOSIS — N401 Enlarged prostate with lower urinary tract symptoms: Secondary | ICD-10-CM | POA: Diagnosis present

## 2019-07-06 DIAGNOSIS — G9340 Encephalopathy, unspecified: Secondary | ICD-10-CM | POA: Diagnosis present

## 2019-07-06 DIAGNOSIS — N136 Pyonephrosis: Secondary | ICD-10-CM | POA: Diagnosis present

## 2019-07-06 DIAGNOSIS — R310 Gross hematuria: Secondary | ICD-10-CM | POA: Diagnosis present

## 2019-07-06 DIAGNOSIS — Z8673 Personal history of transient ischemic attack (TIA), and cerebral infarction without residual deficits: Secondary | ICD-10-CM | POA: Diagnosis not present

## 2019-07-06 DIAGNOSIS — Z8249 Family history of ischemic heart disease and other diseases of the circulatory system: Secondary | ICD-10-CM | POA: Diagnosis not present

## 2019-07-06 DIAGNOSIS — B952 Enterococcus as the cause of diseases classified elsewhere: Secondary | ICD-10-CM | POA: Diagnosis present

## 2019-07-06 DIAGNOSIS — Z91018 Allergy to other foods: Secondary | ICD-10-CM | POA: Diagnosis not present

## 2019-07-06 DIAGNOSIS — I825Z2 Chronic embolism and thrombosis of unspecified deep veins of left distal lower extremity: Secondary | ICD-10-CM | POA: Diagnosis not present

## 2019-07-06 DIAGNOSIS — T83511A Infection and inflammatory reaction due to indwelling urethral catheter, initial encounter: Secondary | ICD-10-CM | POA: Diagnosis not present

## 2019-07-06 DIAGNOSIS — Z79899 Other long term (current) drug therapy: Secondary | ICD-10-CM | POA: Diagnosis not present

## 2019-07-06 DIAGNOSIS — T83518A Infection and inflammatory reaction due to other urinary catheter, initial encounter: Secondary | ICD-10-CM | POA: Diagnosis present

## 2019-07-06 DIAGNOSIS — Z87891 Personal history of nicotine dependence: Secondary | ICD-10-CM | POA: Diagnosis not present

## 2019-07-06 DIAGNOSIS — H409 Unspecified glaucoma: Secondary | ICD-10-CM | POA: Diagnosis present

## 2019-07-06 DIAGNOSIS — I10 Essential (primary) hypertension: Secondary | ICD-10-CM | POA: Diagnosis not present

## 2019-07-06 DIAGNOSIS — Z841 Family history of disorders of kidney and ureter: Secondary | ICD-10-CM | POA: Diagnosis not present

## 2019-07-06 DIAGNOSIS — N3001 Acute cystitis with hematuria: Secondary | ICD-10-CM | POA: Diagnosis not present

## 2019-07-06 DIAGNOSIS — I252 Old myocardial infarction: Secondary | ICD-10-CM | POA: Diagnosis not present

## 2019-07-06 DIAGNOSIS — Z86008 Personal history of in-situ neoplasm of other site: Secondary | ICD-10-CM | POA: Diagnosis not present

## 2019-07-06 DIAGNOSIS — R338 Other retention of urine: Secondary | ICD-10-CM | POA: Diagnosis not present

## 2019-07-06 DIAGNOSIS — Z7901 Long term (current) use of anticoagulants: Secondary | ICD-10-CM | POA: Diagnosis not present

## 2019-07-06 DIAGNOSIS — I340 Nonrheumatic mitral (valve) insufficiency: Secondary | ICD-10-CM | POA: Diagnosis not present

## 2019-07-06 DIAGNOSIS — T83510A Infection and inflammatory reaction due to cystostomy catheter, initial encounter: Secondary | ICD-10-CM | POA: Diagnosis not present

## 2019-07-06 DIAGNOSIS — Z466 Encounter for fitting and adjustment of urinary device: Secondary | ICD-10-CM | POA: Diagnosis not present

## 2019-07-06 DIAGNOSIS — Z8042 Family history of malignant neoplasm of prostate: Secondary | ICD-10-CM | POA: Diagnosis not present

## 2019-07-06 LAB — CBC
HCT: 34.6 % — ABNORMAL LOW (ref 39.0–52.0)
HCT: 35.8 % — ABNORMAL LOW (ref 39.0–52.0)
Hemoglobin: 11.7 g/dL — ABNORMAL LOW (ref 13.0–17.0)
Hemoglobin: 12.1 g/dL — ABNORMAL LOW (ref 13.0–17.0)
MCH: 33.8 pg (ref 26.0–34.0)
MCH: 34.3 pg — ABNORMAL HIGH (ref 26.0–34.0)
MCHC: 33.8 g/dL (ref 30.0–36.0)
MCHC: 33.8 g/dL (ref 30.0–36.0)
MCV: 100 fL (ref 80.0–100.0)
MCV: 101.4 fL — ABNORMAL HIGH (ref 80.0–100.0)
Platelets: 113 10*3/uL — ABNORMAL LOW (ref 150–400)
Platelets: 96 10*3/uL — ABNORMAL LOW (ref 150–400)
RBC: 3.46 MIL/uL — ABNORMAL LOW (ref 4.22–5.81)
RBC: 3.53 MIL/uL — ABNORMAL LOW (ref 4.22–5.81)
RDW: 11.9 % (ref 11.5–15.5)
RDW: 12 % (ref 11.5–15.5)
WBC: 10.5 10*3/uL (ref 4.0–10.5)
WBC: 13.5 10*3/uL — ABNORMAL HIGH (ref 4.0–10.5)
nRBC: 0 % (ref 0.0–0.2)
nRBC: 0 % (ref 0.0–0.2)

## 2019-07-06 LAB — URINALYSIS, ROUTINE W REFLEX MICROSCOPIC
Bilirubin Urine: NEGATIVE
Glucose, UA: NEGATIVE mg/dL
Ketones, ur: NEGATIVE mg/dL
Nitrite: NEGATIVE
Protein, ur: 100 mg/dL — AB
Specific Gravity, Urine: 1.011 (ref 1.005–1.030)
pH: 6 (ref 5.0–8.0)

## 2019-07-06 LAB — COMPREHENSIVE METABOLIC PANEL
ALT: 21 U/L (ref 0–44)
AST: 28 U/L (ref 15–41)
Albumin: 4 g/dL (ref 3.5–5.0)
Alkaline Phosphatase: 60 U/L (ref 38–126)
Anion gap: 9 (ref 5–15)
BUN: 41 mg/dL — ABNORMAL HIGH (ref 8–23)
CO2: 22 mmol/L (ref 22–32)
Calcium: 9.2 mg/dL (ref 8.9–10.3)
Chloride: 113 mmol/L — ABNORMAL HIGH (ref 98–111)
Creatinine, Ser: 2.56 mg/dL — ABNORMAL HIGH (ref 0.61–1.24)
GFR calc Af Amer: 27 mL/min — ABNORMAL LOW (ref >60)
GFR calc non Af Amer: 23 mL/min — ABNORMAL LOW (ref >60)
Glucose, Bld: 157 mg/dL — ABNORMAL HIGH (ref 70–99)
Potassium: 4 mmol/L (ref 3.5–5.1)
Sodium: 144 mmol/L (ref 135–145)
Total Bilirubin: 1.4 mg/dL — ABNORMAL HIGH (ref 0.3–1.2)
Total Protein: 7.1 g/dL (ref 6.5–8.1)

## 2019-07-06 LAB — BASIC METABOLIC PANEL
Anion gap: 10 (ref 5–15)
BUN: 35 mg/dL — ABNORMAL HIGH (ref 8–23)
CO2: 20 mmol/L — ABNORMAL LOW (ref 22–32)
Calcium: 8.3 mg/dL — ABNORMAL LOW (ref 8.9–10.3)
Chloride: 111 mmol/L (ref 98–111)
Creatinine, Ser: 2.24 mg/dL — ABNORMAL HIGH (ref 0.61–1.24)
GFR calc Af Amer: 32 mL/min — ABNORMAL LOW (ref >60)
GFR calc non Af Amer: 27 mL/min — ABNORMAL LOW (ref >60)
Glucose, Bld: 111 mg/dL — ABNORMAL HIGH (ref 70–99)
Potassium: 4.2 mmol/L (ref 3.5–5.1)
Sodium: 141 mmol/L (ref 135–145)

## 2019-07-06 LAB — PROTIME-INR
INR: 2.2 — ABNORMAL HIGH (ref 0.8–1.2)
Prothrombin Time: 24.5 seconds — ABNORMAL HIGH (ref 11.4–15.2)

## 2019-07-06 LAB — POC SARS CORONAVIRUS 2 AG -  ED: SARS Coronavirus 2 Ag: NEGATIVE

## 2019-07-06 LAB — SARS CORONAVIRUS 2 (TAT 6-24 HRS): SARS Coronavirus 2: NEGATIVE

## 2019-07-06 LAB — LACTIC ACID, PLASMA: Lactic Acid, Venous: 1.6 mmol/L (ref 0.5–1.9)

## 2019-07-06 MED ORDER — ONDANSETRON HCL 4 MG/2ML IJ SOLN: 4.0000 mg | Freq: Four times a day (QID) | INTRAMUSCULAR | Status: DC | PRN

## 2019-07-06 MED ORDER — SODIUM CHLORIDE 0.9 % IV SOLN: INTRAVENOUS | Status: DC

## 2019-07-06 MED ORDER — WARFARIN - PHARMACIST DOSING INPATIENT: Freq: Every day | Status: DC

## 2019-07-06 MED ORDER — ADULT MULTIVITAMIN W/MINERALS CH
1.0000 | ORAL_TABLET | Freq: Every day | ORAL | Status: DC
  Administered 2019-07-06 – 2019-07-10 (×5): 1 via ORAL
  Filled 2019-07-06 (×5): qty 1

## 2019-07-06 MED ORDER — OXYBUTYNIN CHLORIDE 5 MG PO TABS: 5.0000 mg | ORAL_TABLET | Freq: Four times a day (QID) | ORAL | Status: DC | PRN

## 2019-07-06 MED ORDER — HALOPERIDOL LACTATE 5 MG/ML IJ SOLN
2.0000 mg | Freq: Once | INTRAMUSCULAR | Status: AC
  Administered 2019-07-06: 2 mg via INTRAVENOUS
  Filled 2019-07-06: qty 1

## 2019-07-06 MED ORDER — ONDANSETRON HCL 4 MG PO TABS: 4.0000 mg | ORAL_TABLET | Freq: Four times a day (QID) | ORAL | Status: DC | PRN

## 2019-07-06 MED ORDER — BROMOCRIPTINE MESYLATE 2.5 MG PO TABS
1.2500 mg | ORAL_TABLET | ORAL | Status: DC
  Administered 2019-07-08 – 2019-07-10 (×3): 1.25 mg via ORAL
  Filled 2019-07-06 (×3): qty 1

## 2019-07-06 MED ORDER — DORZOLAMIDE HCL-TIMOLOL MAL 2-0.5 % OP SOLN
1.0000 [drp] | Freq: Two times a day (BID) | OPHTHALMIC | Status: DC
  Administered 2019-07-06 – 2019-07-10 (×8): 1 [drp] via OPHTHALMIC
  Filled 2019-07-06: qty 10

## 2019-07-06 MED ORDER — CHLORHEXIDINE GLUCONATE CLOTH 2 % EX PADS
6.0000 | MEDICATED_PAD | Freq: Every day | CUTANEOUS | Status: DC
  Administered 2019-07-06 – 2019-07-10 (×5): 6 via TOPICAL

## 2019-07-06 MED ORDER — WARFARIN SODIUM 5 MG PO TABS
5.0000 mg | ORAL_TABLET | Freq: Once | ORAL | Status: AC
  Administered 2019-07-06: 5 mg via ORAL
  Filled 2019-07-06: qty 1

## 2019-07-06 MED ORDER — LATANOPROST 0.005 % OP SOLN
1.0000 [drp] | Freq: Every day | OPHTHALMIC | Status: DC
  Administered 2019-07-06 – 2019-07-09 (×4): 1 [drp] via OPHTHALMIC
  Filled 2019-07-06: qty 2.5

## 2019-07-06 MED ORDER — SODIUM CHLORIDE 0.9 % IV BOLUS
1000.0000 mL | Freq: Once | INTRAVENOUS | Status: AC
  Administered 2019-07-06: 1000 mL via INTRAVENOUS

## 2019-07-06 MED ORDER — SODIUM CHLORIDE 0.9 % IV SOLN
2.0000 g | Freq: Every day | INTRAVENOUS | Status: DC
  Administered 2019-07-06 – 2019-07-07 (×2): 2 g via INTRAVENOUS
  Filled 2019-07-06 (×2): qty 2

## 2019-07-06 MED ORDER — ACETAMINOPHEN 650 MG RE SUPP: 650.0000 mg | Freq: Four times a day (QID) | RECTAL | Status: DC | PRN

## 2019-07-06 MED ORDER — ACETAMINOPHEN 325 MG PO TABS
650.0000 mg | ORAL_TABLET | Freq: Four times a day (QID) | ORAL | Status: DC | PRN
  Administered 2019-07-06: 650 mg via ORAL
  Filled 2019-07-06: qty 2

## 2019-07-06 MED ORDER — SODIUM CHLORIDE 0.9 % IV SOLN
2.0000 g | Freq: Once | INTRAVENOUS | Status: AC
  Administered 2019-07-06: 2 g via INTRAVENOUS
  Filled 2019-07-06: qty 20

## 2019-07-06 NOTE — ED Notes (Signed)
Called multiple times to call receiving nurse, no answer.

## 2019-07-06 NOTE — Progress Notes (Signed)
Pt has arrived to Room 1512 from ED. Alert and oriented.

## 2019-07-06 NOTE — Progress Notes (Signed)
ANTICOAGULATION and Antibiotic CONSULT NOTE - Initial Consult  Pharmacy Consult for Wafarin and Cefepime Indication: VTE prophylaxis (hx of DVT) and UTI  Allergies  Allergen Reactions  . Gluten Meal Other (See Comments)    Gluten Sensitivity (reaction undefined) NO BREAD  . Wheat Bran Other (See Comments)    Reaction undefined        Vital Signs: Temp: 101.1 F (38.4 C) (01/22 2314) Temp Source: Oral (01/22 2314) BP: 117/78 (01/23 0110) Pulse Rate: 70 (01/23 0110)  Labs: Recent Labs    07/05/19 2346  HGB 12.1*  HCT 35.8*  PLT 113*  CREATININE 2.56*    CrCl cannot be calculated (Unknown ideal weight.).   Medical History: Past Medical History:  Diagnosis Date  . Asthma    hx of as child  . Bronchitis    as a child  . Cataract   . Chronic anticoagulation 06/11/2013  . Clotting disorder ([place])   . 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 Elevated PSA    being monitored by physician  . Glaucoma 10/13/2011  . 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 Headache disorder 02/24/2015   occ  . Hypertension    sees [name], primary   . MI (myocardial infarction) ([place]) not sure when   scar tissue saw  . Peyronie's disease   . 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 Pituitary tumor    takes medication to manage  . Prostatitis   . Pulmonary embolism ([place]) yrs ago  . Rash    last 3 months chest and arms, saw md no tx given  . 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 Stroke [facility]) 2017   mild cva    Medications:  Scheduled:  . bromocriptine  1.25 mg Oral See admin instructions  . dorzolamide-timolol  1 drop Both Eyes BID  . latanoprost  1 drop Both Eyes QHS  . multivitamin with minerals  1 tablet Oral Daily   Infusions:  . sodium chloride    . cefTRIAXone  (ROCEPHIN)  IV 2 g (07/06/19 [id number])    Assessment: 34 yoM c/o catheter issues found to have fever on chronic warfarin for hx of DVT and PE. HD 5 mg Sat/Sun, 2.5 mg M/W/Thur and none on Tues. LD 1/21  Baseline labs: INR=pending H/H = 12.1/35.8  Goal of Therapy:  INR 2-3    Plan:  Baseline INR pending Daily INR Cefepime 2 Gm IV q24h F/u scr/cultures  Lawana Pai R 07/06/2019,2:26 AM

## 2019-07-06 NOTE — Progress Notes (Signed)
[place] for Wafarin  Indication: hx of PE, chronic DVT, hx of mild stroke  Allergies  Allergen Reactions  . Gluten Meal Other (See Comments)    Gluten Sensitivity (reaction undefined) NO BREAD  . Wheat Bran Other (See Comments)    Reaction undefined    Height: 6' (182.9 cm) Weight: 189 lb 9.5 oz (86 kg) IBW/kg (Calculated) : 77.6   Vital Signs: BP: 161/81 (01/23 1422) Pulse Rate: 85 (01/23 1422)  Labs: Recent Labs    07/05/19 2346 07/06/19 0610  HGB 12.1* 11.7*  HCT 35.8* 34.6*  PLT 113* 96*  LABPROT  --  24.5*  INR  --  2.2*  CREATININE 2.56* 2.24*    Estimated Creatinine Clearance: 30.8 mL/min (A) (by C-G formula based on SCr of 2.24 mg/dL (H)).   Medical History: Past Medical History:  Diagnosis Date  . Asthma    hx of as child  . Bronchitis    as a child  . Cataract   . Chronic anticoagulation 06/11/2013  . Clotting disorder ([place])   . 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 Elevated PSA    being monitored by physician  . Glaucoma 10/13/2011  . 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 Headache disorder 02/24/2015   occ  . Hypertension    sees [name], primary   . MI (myocardial infarction) ([place]) not sure when   scar tissue saw  . Peyronie's disease   . 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 Pituitary tumor    takes medication to manage  . Prostatitis   . Pulmonary embolism ([place]) yrs ago  . Rash    last 3 months chest and arms, saw md no tx given  . 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 Stroke [facility]) 2017   mild cva    Medications: PTA Warfarin dose per med list: 5 mg Sat/Sun, 2.5 mg M/W/Thur and none on Tues. Last dose PTA 1/21  Assessment: 59 yoM on warfarin PTA for hx of PE/DVT and mild stroke who  presented to [facility] with confusion, fever. Patient has foley catheter in place. Patient currently being treated for catheter-associated UTI. Pharmacy consulted to dose warfarin while patient in the hospital. Per notes, foley catheter draining red tinged urine on admission.    Today, 07/06/19:  INR = 2.2, therapeutic  CBC: Hgb 11.7, Pltc low at 96K  Per RN, urine now amber colored. No bleeding issues noted per nursing.   Goal of Therapy:  INR 2-3   Plan:  Warfarin 5mg  PO x 1 as per home regimen Daily PT/INR Monitor CBC Monitor closely for worsening hematuria and other s/sx of bleeding   Lindell Spar, PharmD, BCPS Clinical Pharmacist  07/06/2019,2:55 PM

## 2019-07-06 NOTE — Progress Notes (Signed)
Skin assessment complete Upon admission to room 1512  With x 2 personnel Lanny Cramp. All skin intact, pressure points within desired limit. Able to turn self in bed. Encourage activity promote nutrition continue to monitor

## 2019-07-06 NOTE — H&P (Signed)
History and Physical    Jay Day [id number] DOB: [birth year] DOA: 07/05/2019  PCP: Leeroy Cha, MD  Patient coming from: Home  I have personally briefly reviewed patient's old medical records in [place]  Chief Complaint: Fever, confusion  HPI: Jay Day is a 79 y.o. 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  Patient has prostate enlargement, due to urinary retention of 1.7L post void residual and B hydro earlier this month patient had foley catheter placed on 1/12.  EMS was called, found patient with fever of 105.0!  Patient reports that cath was placed earlier today but he removed it because it was painful.  (Actually patient is confused and still has a foley catheter in place that is draining red tinged urine).   ED Course: Tm 101.1.  WBC 10.5k.  Creat 2.5 up from 0.9 baseline in 2020 (doesn't look like they checked earlier this month when he had acute retention).  CXR neg, COVID antigen neg, no SOB nor O2 requirement.  Given empiric rocephin in ED and 1L NS bolus.   Review of Systems: Unable to perform due to AMS.  Past Medical History:  Diagnosis Date  . Asthma    hx of as child  . Bronchitis    as a child  . Cataract   . Chronic anticoagulation 06/11/2013  . Clotting disorder ([place])   . 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 Elevated PSA    being monitored by physician  . Glaucoma 10/13/2011  . 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 Headache disorder 02/24/2015   occ  . Hypertension    sees [name], primary   . MI (myocardial infarction) ([place]) not sure when   scar tissue saw  . Peyronie's disease   . 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 Pituitary tumor    takes medication to  manage  . Prostatitis   . Pulmonary embolism ([place]) yrs ago  . Rash    last 3 months chest and arms, saw md no tx given  . 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 Stroke [facility]) 2017   mild cva    Past Surgical History:  Procedure Laterality Date  . COLONOSCOPY N/A 08/22/2016   Procedure: COLONOSCOPY;  Surgeon: Garlan Fair, MD;  Location: [facility];  Service: Endoscopy;  Laterality: N/A;  . colonscopy  2007  . EYE SURGERY     bilateral cataract surgery  . HERNIA REPAIR  1960  . INGUINAL HERNIA REPAIR  08/12/2011   Procedure: LAPAROSCOPIC INGUINAL HERNIA;  Surgeon: [name], MD;  Location: [facility];  Service: General;  Laterality: Left;  . [place]   left   . LEFT HEART CATHETERIZATION WITH CORONARY ANGIOGRAM N/A 05/06/2014   Procedure: LEFT HEART CATHETERIZATION WITH CORONARY ANGIOGRAM;  Surgeon: [name], MD;  Location: [facility] CATH LAB;  Service: Cardiovascular;  Laterality: N/A;  . TONSILLECTOMY     as a child     reports that he quit smoking about 49 years ago. His smoking use included cigarettes. He has a 1.50 pack-year smoking history. He has never used smokeless tobacco. He reports that he does not drink alcohol or use drugs.  Allergies  Allergen Reactions  .  Gluten Meal Other (See Comments)    Gluten Sensitivity (reaction undefined) NO BREAD  . Wheat Bran Other (See Comments)    Reaction undefined    Family History  Problem Relation Age of Onset  . Pulmonary embolism Mother   . Kidney failure Mother   . Hypertension Mother   . Kidney Stones Mother   . Pulmonary embolism Father   . Prostate cancer Father   . Pulmonary embolism Maternal Grandmother   . Pulmonary embolism Maternal Grandfather   . Pulmonary embolism Paternal Grandmother   . Pulmonary embolism Paternal Grandfather      Prior to Admission medications   Medication Sig Start Date End Date Taking? Authorizing Provider  bromocriptine (PARLODEL) 2.5  MG tablet TAKE 1/2 TABLET BY MOUTH 5 TIMES A WEEK Patient taking differently: Take 1.25 mg by mouth See admin instructions. TAKE 1/2 TABLET BY MOUTH 5 TIMES A WEEK. M-F 04/29/19  Yes Elayne Snare, MD  dorzolamide-timolol (COSOPT) 22.3-6.8 MG/ML ophthalmic solution Place 1 drop into both eyes 2 (two) times daily.  06/12/19  Yes [provider]  fluticasone (FLONASE) 50 MCG/ACT nasal spray Place 1 spray into both nostrils daily.   Yes [provider]  latanoprost (XALATAN) 0.005 % ophthalmic solution Place 1 drop into both eyes at bedtime.  03/30/15  Yes [provider]  metoprolol succinate (TOPROL-XL) 25 MG 24 hr tablet Take 50 mg by mouth daily.    Yes [provider]  Multiple Vitamin (MULTIVITAMIN WITH MINERALS) TABS tablet Take 1 tablet by mouth daily.   Yes [provider]  oxybutynin (DITROPAN) 5 MG tablet Take 5-10 mg by mouth every 6 (six) hours as needed for bladder spasms.  07/03/19  Yes [provider]  Probiotic Product (PROBIOTIC ADVANCED PO) Take 1 capsule by mouth daily.   Yes [provider]  telmisartan (MICARDIS) 40 MG tablet Take 1 tablet (40 mg total) by mouth daily. 05/08/19  Yes Tanda Rockers, MD  warfarin (COUMADIN) 5 MG tablet Start from tomorrow Patient taking differently: Take 2.5-5 mg by mouth See admin instructions. Sunday 5mg , Monday 2.5mg ,Tuesday does not take, Wednesday 2.5mg , Thursday 2.5mg , and Friday 2.5mg , Saturday 5 mg 07/04/18  Yes Adhikari, Tamsen Meek, MD  Zinc 22.5 MG TABS Take 22.5 mg by mouth daily.   Yes [provider]    Physical Exam: Vitals:   07/05/19 2304 07/05/19 2314 07/06/19 0110  BP:  139/71 117/78  Pulse:  79 70  Resp:  (!) 24 20  Temp:  (!) 101.1 F (38.4 C)   TempSrc:  Oral   SpO2: 97% 97% 100%    Constitutional: NAD, calm, comfortable Eyes: PERRL, lids and conjunctivae normal ENMT: Mucous membranes are moist. Posterior pharynx clear of any exudate or lesions.Normal  dentition.  Neck: normal, supple, no masses, no thyromegaly Respiratory: clear to auscultation bilaterally, no wheezing, no crackles. Normal respiratory effort. No accessory muscle use.  Cardiovascular: Regular rate and rhythm, no murmurs / rubs / gallops. No extremity edema. 2+ pedal pulses. No carotid bruits.  Abdomen: no tenderness, no masses palpated. No hepatosplenomegaly. Bowel sounds positive.  Musculoskeletal: no clubbing / cyanosis. No joint deformity upper and lower extremities. Good ROM, no contractures. Normal muscle tone.  Skin: no rashes, lesions, ulcers. No induration Neurologic: CN 2-12 grossly intact. Sensation intact, DTR normal. Strength 5/5 in all 4.  Psychiatric: Pleasantly confused   Labs on Admission: I have personally reviewed following labs and imaging studies  CBC: Recent Labs  Lab 07/05/19 2346  WBC 10.5  HGB 12.1*  HCT 35.8*  MCV 101.4*  PLT 123456*   Basic Metabolic Panel: Recent Labs  Lab 07/05/19 2346  NA 144  K 4.0  CL 113*  CO2 22  GLUCOSE 157*  BUN 41*  CREATININE 2.56*  CALCIUM 9.2   GFR: CrCl cannot be calculated (Unknown ideal weight.). Liver Function Tests: Recent Labs  Lab 07/05/19 2346  AST 28  ALT 21  ALKPHOS 60  BILITOT 1.4*  PROT 7.1  ALBUMIN 4.0   No results for input(s): LIPASE, AMYLASE in the last 168 hours. No results for input(s): AMMONIA in the last 168 hours. Coagulation Profile: No results for input(s): INR, PROTIME in the last 168 hours. Cardiac Enzymes: No results for input(s): CKTOTAL, CKMB, CKMBINDEX, TROPONINI in the last 168 hours. BNP (last 3 results) No results for input(s): PROBNP in the last 8760 hours. HbA1C: No results for input(s): HGBA1C in the last 72 hours. CBG: No results for input(s): GLUCAP in the last 168 hours. Lipid Profile: No results for input(s): CHOL, HDL, LDLCALC, TRIG, CHOLHDL, LDLDIRECT in the last 72 hours. Thyroid Function Tests: No results for input(s): TSH, T4TOTAL, FREET4,  T3FREE, THYROIDAB in the last 72 hours. Anemia Panel: No results for input(s): VITAMINB12, FOLATE, FERRITIN, TIBC, IRON, RETICCTPCT in the last 72 hours. Urine analysis:    Component Value Date/Time   COLORURINE RED (A) 07/06/2019 0207   APPEARANCEUR CLOUDY (A) 07/06/2019 0207   LABSPEC PENDING 07/06/2019 0207   PHURINE PENDING 07/06/2019 0207   GLUCOSEU PENDING 07/06/2019 0207   HGBUR PENDING 07/06/2019 0207   BILIRUBINUR PENDING 07/06/2019 0207   KETONESUR PENDING 07/06/2019 0207   PROTEINUR PENDING 07/06/2019 0207   NITRITE PENDING 07/06/2019 0207   LEUKOCYTESUR PENDING 07/06/2019 0207    Radiological Exams on Admission: DG Chest Port 1 View  Result Date: 07/05/2019 CLINICAL DATA:  Fever EXAM: PORTABLE CHEST 1 VIEW COMPARISON:  08/08/2018 FINDINGS: No focal airspace disease or effusion. Borderline cardiomegaly. Aortic atherosclerosis. No pneumothorax. IMPRESSION: No active disease. Electronically Signed   By: Donavan Foil M.D.   On: 07/05/2019 23:45    EKG: Independently reviewed.  Assessment/Plan Principal Problem:   Catheter-associated urinary tract infection (HCC) Active Problems:   Prolactinoma (HCC)   Essential hypertension   Chronic deep vein thrombosis (DVT) of distal vein of left lower extremity (HCC)   History of pulmonary embolism   Acute encephalopathy   AKI (acute kidney injury) ([place])    1. Acute encephalopathy, fever, gross hematuria in setting of recent foley placement on 1/12 - 1. Treat as CAUTI 2. Empiric Cefepime 3. UA shows moderate LE, large HGB, neg nitrites 4. UCx pending 5. BCx pending 6. Getting renal US to make sure no associated obstruction, though foley in place and draining 2. AKI - 1. Foley in place and draining 2. Unclear baseline after the obstruction earlier this month 3. Renal US 4. IVF: 1L NS bolus and 125 cc/hr NS 5. Strict intake and output 6. Repeat BMP in AM 3. H/o PE, chronic DVT, clotting disorder - 1. Check  INR 2. Continue coumadin per pharm consult 4. Prolactinoma - 1. Continue bromocryptine 2. If patient not improving with UTI treatment, consider CNS imaging and checking prolactin level, but seems less likely that this is the cause of his AMS: 1. No focal deficit 2. Wouldn't explain fever nor hematuria nor AKI 3. Has been stable / reduced in size with treatment since diagnosis in 2003, or at least it was on most recent  MRI in 2018. 5. HTN - 1. BPs on the lower side 2. Holding nephrotoxic ARB 3. Will also hold metoprolol for the moment  DVT prophylaxis: Coumadin Code Status: Full Family Communication: No family in room Disposition Plan: Home after admit Consults called: None Admission status: Admit to inpatient  Severity of Illness: The appropriate patient status for this patient is INPATIENT. Inpatient status is judged to be reasonable and necessary in order to provide the required intensity of service to ensure the patient's safety. The patient's presenting symptoms, physical exam findings, and initial radiographic and laboratory data in the context of their chronic comorbidities is felt to place them at high risk for further clinical deterioration. Furthermore, it is not anticipated that the patient will be medically stable for discharge from the hospital within 2 midnights of admission. The following factors support the patient status of inpatient.   IP status due to acute metabolic encephalopathy which is secondary to infection / fever (probably a CAUTI).   * I certify that at the point of admission it is my clinical judgment that the patient will require inpatient hospital care spanning beyond 2 midnights from the point of admission due to high intensity of service, high risk for further deterioration and high frequency of surveillance required.*    Vinessa Macconnell M. DO [facility]  How to contact the [facility] Attending or Consulting provider [place] or covering provider during  after hours [place], for this patient?  1. Check the care team in [facility] and look for a) attending/consulting [facility] provider listed and b) the [facility] team listed 2. Log into [email or web address]  Amion Physician Scheduling and messaging for groups and whole hospitals  On call and physician scheduling software for group practices, residents, hospitalists and other medical providers for call, clinic, rotation and shift schedules. OnCall [employer] is a hospital-wide system for scheduling doctors and paging doctors on call. EasyPlot is for scientific plotting and data analysis.  [email or web address]  and use [facility] universal password to access. If you do not have the password, please contact the hospital operator.  3. Locate the [facility] provider you are looking for under [facility] and page to a number that you can be directly reached. 4. If you still have difficulty reaching the provider, please page the [facility] (Director on Call) for the Hospitalists listed on amion for assistance.  07/06/2019, 2:24 AM

## 2019-07-06 NOTE — Care Management Note (Signed)
Patient seen and examined in the ER still waiting for bed he feels better his Covid 37 is negative patient is being admitted for catheter induced urinary tract infection he denies any fever now is a have fever yesterday still has a little bit of chills today denies any pain.

## 2019-07-06 NOTE — Plan of Care (Signed)
Care Plan initiated and discussed with patient. Educated on call bell use bed in low position. Demonstrated how to use call bell. Denies pain will continue to monitor

## 2019-07-06 NOTE — Progress Notes (Signed)
PHARMACY - PHYSICIAN COMMUNICATION CRITICAL VALUE ALERT - BLOOD CULTURE IDENTIFICATION (BCID)  Jay Day is an 79 y.o. male who presented to [facility] on 07/05/2019 with a chief complaint of fever, confusion.  Assessment:  Pt admitted with encephalopathy, AKI, hematuria, fever. Currently prescribed cefepime for UTI. BCID + 1/4 GPC in clusters, no BCID panel ran at this time.   Name of physician (or Provider) Contacted: Arby Barrette, NP  Current antibiotics: Cefepime  Changes to prescribed antibiotics: Most likely contaminant, no changes to antibiotics at this time.  No results found for this or any previous visit.  Lenis Noon, PharmD 07/06/2019  8:58 PM

## 2019-07-06 NOTE — ED Notes (Signed)
Ultrasound tech at bedside

## 2019-07-07 DIAGNOSIS — N179 Acute kidney failure, unspecified: Secondary | ICD-10-CM

## 2019-07-07 DIAGNOSIS — I10 Essential (primary) hypertension: Secondary | ICD-10-CM

## 2019-07-07 DIAGNOSIS — G9340 Encephalopathy, unspecified: Secondary | ICD-10-CM

## 2019-07-07 DIAGNOSIS — T83510A Infection and inflammatory reaction due to cystostomy catheter, initial encounter: Secondary | ICD-10-CM

## 2019-07-07 DIAGNOSIS — I825Z2 Chronic embolism and thrombosis of unspecified deep veins of left distal lower extremity: Secondary | ICD-10-CM

## 2019-07-07 LAB — PROTIME-INR
INR: 2.1 — ABNORMAL HIGH (ref 0.8–1.2)
Prothrombin Time: 23.5 seconds — ABNORMAL HIGH (ref 11.4–15.2)

## 2019-07-07 LAB — CBC
HCT: 31.4 % — ABNORMAL LOW (ref 39.0–52.0)
Hemoglobin: 10.9 g/dL — ABNORMAL LOW (ref 13.0–17.0)
MCH: 34.3 pg — ABNORMAL HIGH (ref 26.0–34.0)
MCHC: 34.7 g/dL (ref 30.0–36.0)
MCV: 98.7 fL (ref 80.0–100.0)
Platelets: 94 10*3/uL — ABNORMAL LOW (ref 150–400)
RBC: 3.18 MIL/uL — ABNORMAL LOW (ref 4.22–5.81)
RDW: 11.9 % (ref 11.5–15.5)
WBC: 13.6 10*3/uL — ABNORMAL HIGH (ref 4.0–10.5)
nRBC: 0 % (ref 0.0–0.2)

## 2019-07-07 LAB — CREATININE, SERUM
Creatinine, Ser: 1.88 mg/dL — ABNORMAL HIGH (ref 0.61–1.24)
GFR calc Af Amer: 39 mL/min — ABNORMAL LOW (ref >60)
GFR calc non Af Amer: 34 mL/min — ABNORMAL LOW (ref >60)

## 2019-07-07 MED ORDER — METOPROLOL SUCCINATE ER 50 MG PO TB24
50.0000 mg | ORAL_TABLET | Freq: Every day | ORAL | Status: DC
  Administered 2019-07-07 – 2019-07-10 (×4): 50 mg via ORAL
  Filled 2019-07-07 (×4): qty 1

## 2019-07-07 MED ORDER — METOPROLOL TARTRATE 50 MG PO TABS: 50.0000 mg | ORAL_TABLET | Freq: Every day | ORAL | Status: DC

## 2019-07-07 MED ORDER — IRBESARTAN 150 MG PO TABS: 150.0000 mg | ORAL_TABLET | Freq: Every day | ORAL | Status: DC

## 2019-07-07 MED ORDER — SODIUM CHLORIDE 0.9 % IV SOLN
2.0000 g | Freq: Two times a day (BID) | INTRAVENOUS | Status: DC
  Filled 2019-07-07: qty 2

## 2019-07-07 MED ORDER — WARFARIN SODIUM 5 MG PO TABS
5.0000 mg | ORAL_TABLET | Freq: Once | ORAL | Status: AC
  Administered 2019-07-07: 5 mg via ORAL
  Filled 2019-07-07: qty 1

## 2019-07-07 MED ORDER — SODIUM CHLORIDE 0.9 % IV SOLN
2.0000 g | Freq: Four times a day (QID) | INTRAVENOUS | Status: DC
  Administered 2019-07-07 – 2019-07-10 (×12): 2 g via INTRAVENOUS
  Filled 2019-07-07 (×2): qty 2000
  Filled 2019-07-07 (×3): qty 2
  Filled 2019-07-07 (×2): qty 2000
  Filled 2019-07-07 (×3): qty 2
  Filled 2019-07-07 (×4): qty 2000
  Filled 2019-07-07 (×3): qty 2

## 2019-07-07 NOTE — Progress Notes (Signed)
Pharmacy Antibiotic Note  Jay Day is a 79 y.o. male admitted on 07/05/2019 with catheter-associated UTI. Pharmacy has been consulted for Cefepime dosing. AKI on admission. SCr improving.   Plan: Adjust Cefepime to 2g IV q12h for improvement in renal function. Monitor renal function, cultures, clinical course.   Height: 6' (182.9 cm) Weight: 189 lb 9.5 oz (86 kg) IBW/kg (Calculated) : 77.6  Temp (24hrs), Avg:99.3 F (37.4 C), Min:98.6 F (37 C), Max:100.5 F (38.1 C)  Recent Labs  Lab 07/05/19 2346 07/06/19 0610 07/07/19 0541  WBC 10.5 13.5* 13.6*  CREATININE 2.56* 2.24* 1.88*  LATICACIDVEN 1.6  --   --     Estimated Creatinine Clearance: 36.7 mL/min (A) (by C-G formula based on SCr of 1.88 mg/dL (H)).    Allergies  Allergen Reactions  . Gluten Meal Other (See Comments)    Gluten Sensitivity (reaction undefined) NO BREAD  . Wheat Bran Other (See Comments)    Reaction undefined    Antimicrobials this admission: 1/23 Ceftriaxone x 1 1/23 Cefepime >>   Microbiology results: 1/22 BCx: 1/4 GPC in clusters-? contaminant  1/23 UCx: reincubated for better growth  1/23 COVID: negative    Thank you for allowing pharmacy to be a part of this patient's care.  Luiz Ochoa 07/07/2019 9:54 AM

## 2019-07-07 NOTE — Progress Notes (Addendum)
Jay Day for [place]  Indication: hx of PE, chronic DVT, hx of mild stroke  Allergies  Allergen Reactions  . Gluten Meal Other (See Comments)    Gluten Sensitivity (reaction undefined) NO BREAD  . Wheat Bran Other (See Comments)    Reaction undefined    Height: 6' (182.9 cm) Weight: 189 lb 9.5 oz (86 kg) IBW/kg (Calculated) : 77.6   Vital Signs: Temp: 98.8 F (37.1 C) (01/24 0559) Temp Source: Oral (01/24 0559) BP: 140/90 (01/24 0559) Pulse Rate: 78 (01/24 0559)  Labs: Recent Labs    07/05/19 2346 07/05/19 2346 07/06/19 0610 07/07/19 0541  HGB 12.1*   < > 11.7* 10.9*  HCT 35.8*  --  34.6* 31.4*  PLT 113*  --  96* 94*  LABPROT  --   --  24.5* 23.5*  INR  --   --  2.2* 2.1*  CREATININE 2.56*  --  2.24* 1.88*   < > = values in this interval not displayed.    Estimated Creatinine Clearance: 36.7 mL/min (A) (by C-G formula based on SCr of 1.88 mg/dL (H)).   Medical History: Past Medical History:  Diagnosis Date  . Asthma    hx of as child  . Bronchitis    as a child  . Cataract   . Chronic anticoagulation 06/11/2013  . Clotting disorder ([place])   . 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 Elevated PSA    being monitored by physician  . Glaucoma 10/13/2011  . 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 Headache disorder 02/24/2015   occ  . Hypertension    sees [name], primary   . MI (myocardial infarction) ([place]) not sure when   scar tissue saw  . Peyronie's disease   . 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 Pituitary tumor    takes medication to manage  . Prostatitis   . Pulmonary embolism ([place]) yrs ago  . Rash    last 3 months chest and arms, saw md no tx given  . 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 Stroke [facility])  2017   mild cva    Medications: PTA Warfarin dose per med list: 5 mg Sat/Sun, 2.5 mg M/W/Thur and none on Tues. Last dose PTA 1/21  Assessment: 63 yoM on warfarin PTA for hx of PE/DVT and mild stroke who presented to [facility] with confusion, fever. Patient has foley catheter in place. Patient currently being treated for catheter-associated UTI. Pharmacy consulted to dose warfarin while patient in the hospital. Per notes, foley catheter draining red tinged urine on admission.    Today, 07/07/19:  INR = 2.1, remains therapeutic  CBC: Hgb decreased to 10.9, Pltc low/decreased to 94K  Per RN, urine now amber colored. No bleeding issues noted per nursing.   Goal of Therapy:  INR 2-3   Plan:  Warfarin 5mg  PO x 1 as per home regimen Daily PT/INR Monitor CBC Monitor closely for hematuria and other s/sx of bleeding   Lindell Spar, PharmD, BCPS Clinical Pharmacist  07/07/2019,10:00 AM

## 2019-07-07 NOTE — Progress Notes (Signed)
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  He was admitted due to a fever of 105.0 patient was slightly confused on admission his white count was noted to be 10.5 thousand and his temperature in the ED was 101.1  Subjective: Patient seen and examined at bedside he denies any fever stated that he feels fine. His blood culture is showing gram-positive cocci in clusters.    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  Acute encephalopathy with fever and gross hematuria in the setting of recent Foley catheter placement likely community-acquired UTI his blood culture was showing gram-positive cocci in clusters possible contaminant we will await the full culture and the second bottle Urine cx grew enterococcus.  Will switch cefepime to ampicillin   2.  AKI.  There is some improvement on admission his creatinine was 2.24 today is 1.88 continue IV hydration strict is intake and output monitor renal function.  3.  History of pulmonary embolism and chronic DVT and clotting disorder continue Coumadin  4.  History of prolactinoma is on bromocriptine will continue with bromocriptine  Hypertension blood pressure is stable in the 140s his Micardis is on hold on admission his blood pressure was lower is starting to go up now so I will add his  metoprolol but will continue to hold his Micardis.      Code Status: Full  Severity of Illness: The appropriate patient status for this patient is INPATIENT. Inpatient status is judged to be reasonable and necessary in order to provide the required intensity of service to ensure the patient's safety. The patient's presenting symptoms, physical exam findings, and initial radiographic and laboratory data in the context of their chronic comorbidities is felt to place them at high risk for further clinical deterioration. Furthermore, it is not anticipated that the patient will be medically stable for discharge from the hospital within 2 midnights of admission. The following factors support the patient status of inpatient.   " The patient's presenting symptoms include altered mental status with UTI and fever. " The worrisome physical exam findings include acute mental status. " The initial radiographic and laboratory data are worrisome because of urinary tract infection. 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   * I certify that at the point of admission it is my clinical judgment that the patient will require inpatient hospital care spanning beyond 2 midnights from the point of admission due to high intensity of service, high risk for further deterioration and high frequency of surveillance required.*    Family Communication: None at bedside  Disposition Plan: Home when stable   Consultants:  None  Procedures:  None  Antimicrobials:   Changed to cefepime  Changed to ampicillin  DVT prophylaxis: Warfarin    Objective: Vitals:   07/06/19 1700 07/06/19 1756 07/06/19 2120 07/07/19 0559  BP: 135/80 (!) 141/87 (!) 154/88 140/90  Pulse:  65 76 78  Resp:  18 19 20   Temp:  99.1 F (37.3 C) 98.6 F (37 C) 98.8 F (37.1 C)  TempSrc:  Oral Oral Oral  SpO2:  99% 99% 96%  Weight:      Height:        Intake/Output Summary (Last 24 hours) at 07/07/2019  0948 Last data filed at 07/07/2019 0344 Gross per 24 hour  Intake 3197.97 ml  Output 1930 ml  Net 1267.97 ml   Filed Weights   07/06/19 0241  Weight: 86 kg   Body mass index is 25.71 kg/m.  Exam:  . General: 79 y.o. year-old male well developed well nourished in no acute distress.  Alert and oriented x3.  Pleasant . Cardiovascular: Regular rate and rhythm with no rubs or gallops.  No thyromegaly or JVD noted.   Marland Kitchen Respiratory: Clear to auscultation with no wheezes or rales. Good inspiratory effort. . Abdomen: Soft nontender nondistended with normal bowel sounds x4 quadrants . GU: Foley catheter in place draining brownish urine. . Musculoskeletal: No lower extremity edema. 2/4 pulses in all 4 extremities. . Skin: No ulcerative lesions noted or rashes, . Psychiatry: Mood is appropriate for condition and setting asking the right questions    Data Reviewed: CBC: Recent Labs  Lab 07/05/19 2346 07/06/19 0610 07/07/19 0541  WBC 10.5 13.5* 13.6*  HGB 12.1* 11.7* 10.9*  HCT 35.8* 34.6* 31.4*  MCV 101.4* 100.0 98.7  PLT 113* 96* 94*   Basic Metabolic Panel: Recent Labs  Lab 07/05/19 2346 07/06/19 0610 07/07/19 0541  NA 144 141  --   K 4.0 4.2  --   CL 113* 111  --   CO2 22 20*  --   GLUCOSE 157* 111*  --   BUN 41* 35*  --   CREATININE 2.56* 2.24* 1.88*  CALCIUM 9.2 8.3*  --    GFR: Estimated Creatinine Clearance: 36.7 mL/min (A) (by C-G formula based on SCr of 1.88 mg/dL (H)). Liver Function Tests: Recent Labs  Lab 07/05/19 2346  AST 28  ALT 21  ALKPHOS 60  BILITOT 1.4*  PROT 7.1  ALBUMIN 4.0   No results for input(s): LIPASE, AMYLASE in the last 168 hours. No results for input(s): AMMONIA in the last 168 hours. Coagulation Profile: Recent Labs  Lab 07/06/19 0610 07/07/19 0541  INR 2.2* 2.1*   Cardiac Enzymes: No results for input(s): CKTOTAL, CKMB, CKMBINDEX, TROPONINI in the last 168 hours. BNP (last 3 results) No results for input(s): PROBNP in  the last 8760 hours. HbA1C: No results for input(s): HGBA1C in the last 72 hours. CBG: No results for input(s): GLUCAP in the last 168 hours. Lipid Profile: No results for input(s): CHOL, HDL, LDLCALC, TRIG, CHOLHDL, LDLDIRECT in the last 72 hours. Thyroid Function Tests: No results for input(s): TSH, T4TOTAL, FREET4, T3FREE, THYROIDAB in the last 72 hours. Anemia Panel: No results for input(s): VITAMINB12, FOLATE, FERRITIN, TIBC, IRON, RETICCTPCT in the last 72 hours. Urine analysis:    Component Value Date/Time   COLORURINE RED (A) 07/06/2019 0207   APPEARANCEUR CLOUDY (A) 07/06/2019 0207   LABSPEC 1.011 07/06/2019 0207   PHURINE 6.0 07/06/2019 0207   GLUCOSEU NEGATIVE 07/06/2019 0207   HGBUR LARGE (A) 07/06/2019 0207   BILIRUBINUR NEGATIVE 07/06/2019 0207   KETONESUR NEGATIVE 07/06/2019 0207   PROTEINUR 100 (A) 07/06/2019 0207   NITRITE NEGATIVE 07/06/2019 0207   LEUKOCYTESUR MODERATE (A) 07/06/2019 0207   Sepsis Labs: @LABRCNTIP (procalcitonin:4,lacticidven:4)  ) Recent Results (from the past 240 hour(s))  Culture, blood (Routine  X 2) w Reflex to ID Panel     Status: None (Preliminary result)   Collection Time: 07/05/19 11:46 PM   Specimen: BLOOD  Result Value Ref Range Status   Specimen Description   Final    BLOOD RIGHT ANTECUBITAL Performed at [facility], [place] [address], [place], [place] [address]    Special Requests   Final    BOTTLES DRAWN AEROBIC AND ANAEROBIC Blood Culture adequate volume Performed at [place] [address] [place], [place] [address]    Culture  Setup Time   Final    GRAM POSITIVE COCCI IN CLUSTERS AEROBIC BOTTLE ONLY CRITICAL RESULT CALLED TO, READ BACK BY AND VERIFIED WITH: M SWAYNE,PHARMD AT 2050 07/06/19 BY L BENFIELD Performed at [facility], [place] [address] [place], [place] [address]    Culture GRAM POSITIVE COCCI  Final   Report Status PENDING  Incomplete  Culture, Urine     Status: None  (Preliminary result)   Collection Time: 07/06/19  2:07 AM   Specimen: Urine, Random  Result Value Ref Range Status   Specimen Description   Final    URINE, RANDOM Performed at [facility], [place] [address], [place], [place] [address]    Special Requests   Final    NONE Performed at [facility], [place] [address], [place], [place] [address]    Culture   Final    CULTURE REINCUBATED FOR BETTER GROWTH Performed at [facility], [place] [address] [place], [place] [address]    Report Status PENDING  Incomplete  SARS CORONAVIRUS 2 (TAT 6-24 HRS) Nasopharyngeal Nasopharyngeal Swab     Status: None   Collection Time: 07/06/19  2:19 AM   Specimen: Nasopharyngeal Swab  Result Value Ref Range Status   SARS Coronavirus 2 NEGATIVE NEGATIVE Final    Comment: (NOTE) SARS-CoV-2 target nucleic acids are NOT DETECTED. The SARS-CoV-2 RNA is generally detectable in upper and lower respiratory specimens during the acute phase of infection. Negative results do not preclude SARS-CoV-2 infection, do not rule out co-infections with other pathogens, and should not be used as the sole basis for treatment or other patient management decisions. Negative results must be combined with clinical observations, patient history, and epidemiological information. The expected result is Negative. Fact Sheet for Patients: SugarRoll.be Fact Sheet for Healthcare Providers: https://www.woods-mathews.com/ This test is not yet approved or cleared by the [place] FDA and  has been authorized for detection and/or diagnosis of SARS-CoV-2 by FDA under an Emergency Use Authorization (EUA). This EUA will remain  in effect (meaning this test can be used) for the duration of the COVID-19 declaration under Section 56 4(b)(1) of the Act, 21 U.S.C. section 360bbb-3(b)(1), unless the authorization is terminated or revoked sooner. Performed at  [facility], [place] [address], [place], [place] [address]       Studies: No results found.  Scheduled Meds: . [START ON 07/08/2019] bromocriptine  1.25 mg Oral Once per day on Mon Tue Wed Thu Fri  . Chlorhexidine Gluconate Cloth  6 each Topical Daily  . dorzolamide-timolol  1 drop Both Eyes BID  . latanoprost  1 drop Both Eyes QHS  . multivitamin with minerals  1 tablet Oral Daily  . Warfarin - Pharmacist Dosing Inpatient   Does not apply q1800    Continuous Infusions: . sodium chloride 125 mL/hr at 07/06/19 2131  . ceFEPime (MAXIPIME) IV 2 g (07/07/19 0342)  LOS: 1 day     Cristal Deer, MD [facility]  To reach me or the doctor on call, go to: [email or web address] Password [email or web address]  07/07/2019, 9:48 AM

## 2019-07-08 ENCOUNTER — Encounter (HOSPITAL_COMMUNITY): Payer: Self-pay | Admitting: Internal Medicine

## 2019-07-08 DIAGNOSIS — N390 Urinary tract infection, site not specified: Secondary | ICD-10-CM

## 2019-07-08 DIAGNOSIS — T83511A Infection and inflammatory reaction due to indwelling urethral catheter, initial encounter: Secondary | ICD-10-CM

## 2019-07-08 LAB — CBC
HCT: 30.5 % — ABNORMAL LOW (ref 39.0–52.0)
Hemoglobin: 10.3 g/dL — ABNORMAL LOW (ref 13.0–17.0)
MCH: 33.9 pg (ref 26.0–34.0)
MCHC: 33.8 g/dL (ref 30.0–36.0)
MCV: 100.3 fL — ABNORMAL HIGH (ref 80.0–100.0)
Platelets: 97 10*3/uL — ABNORMAL LOW (ref 150–400)
RBC: 3.04 MIL/uL — ABNORMAL LOW (ref 4.22–5.81)
RDW: 12 % (ref 11.5–15.5)
WBC: 10.6 10*3/uL — ABNORMAL HIGH (ref 4.0–10.5)
nRBC: 0 % (ref 0.0–0.2)

## 2019-07-08 LAB — URINE CULTURE: Culture: 80000 — AB

## 2019-07-08 LAB — CREATININE, SERUM
Creatinine, Ser: 1.63 mg/dL — ABNORMAL HIGH (ref 0.61–1.24)
GFR calc Af Amer: 47 mL/min — ABNORMAL LOW (ref >60)
GFR calc non Af Amer: 40 mL/min — ABNORMAL LOW (ref >60)

## 2019-07-08 LAB — CULTURE, BLOOD (ROUTINE X 2): Special Requests: ADEQUATE

## 2019-07-08 LAB — PROTIME-INR
INR: 1.8 — ABNORMAL HIGH (ref 0.8–1.2)
Prothrombin Time: 21.1 seconds — ABNORMAL HIGH (ref 11.4–15.2)

## 2019-07-08 MED ORDER — WARFARIN SODIUM 5 MG PO TABS
5.0000 mg | ORAL_TABLET | Freq: Once | ORAL | Status: AC
  Administered 2019-07-08: 5 mg via ORAL
  Filled 2019-07-08: qty 1

## 2019-07-08 MED ORDER — ZOLPIDEM TARTRATE 5 MG PO TABS: 5.0000 mg | ORAL_TABLET | Freq: Every evening | ORAL | Status: DC | PRN

## 2019-07-08 NOTE — Progress Notes (Signed)
[place] for [facility]  Indication: hx of PE, chronic DVT, hx of mild stroke  Allergies  Allergen Reactions  . Gluten Meal Other (See Comments)    Gluten Sensitivity (reaction undefined) NO BREAD  . Wheat Bran Other (See Comments)    Reaction undefined    Height: 6' (182.9 cm) Weight: 189 lb 9.5 oz (86 kg) IBW/kg (Calculated) : 77.6   Vital Signs: Temp: 97.6 F (36.4 C) (01/25 0617) Temp Source: Oral (01/25 0617) BP: 132/78 (01/25 0617) Pulse Rate: 60 (01/25 0617)  Labs: Recent Labs    07/06/19 0610 07/06/19 0610 07/07/19 0541 07/08/19 0528  HGB 11.7*   < > 10.9* 10.3*  HCT 34.6*  --  31.4* 30.5*  PLT 96*  --  94* 97*  LABPROT 24.5*  --  23.5* 21.1*  INR 2.2*  --  2.1* 1.8*  CREATININE 2.24*  --  1.88* 1.63*   < > = values in this interval not displayed.    Estimated Creatinine Clearance: 42.3 mL/min (A) (by C-G formula based on SCr of 1.63 mg/dL (H)).   Medical History: Past Medical History:  Diagnosis Date  . Asthma    hx of as child  . Bronchitis    as a child  . Cataract   . Chronic anticoagulation 06/11/2013  . Clotting disorder ([place])   . 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 Elevated PSA    being monitored by physician  . Glaucoma 10/13/2011  . 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 Headache disorder 02/24/2015   occ  . Hypertension    sees [name], primary   . MI (myocardial infarction) ([place]) not sure when   scar tissue saw  . Peyronie's disease   . Pituitary adenoma ([place]) 10/13/2011  . Pituitary tumor    takes medication to manage  . Prostatitis   . Pulmonary embolism ([place]) yrs ago  . Rash    last 3 months chest and arms, saw md no tx given  . 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 Stroke [facility])  2017   mild cva    Medications: PTA Warfarin dose per med list: 5 mg Sat/Sun, 2.5 mg M/W/Thur and none on Tues. Last dose PTA 1/21  Assessment: 16 yoM on warfarin PTA for hx of PE/DVT and mild stroke who presented to [facility] with confusion, fever. Patient has foley catheter in place. Patient currently being treated for catheter-associated UTI. Pharmacy consulted to dose warfarin while patient in the hospital. Per notes, foley catheter draining red tinged urine on admission.    Today, 07/08/19:  INR = 1.8, subtherapeutic  CBC: Hgb decreased to 10.3, Pltc low/decreased to 97K   No bleeding issues noted   Goal of Therapy:  INR 2-3   Plan:  Warfarin 5mg  PO x 1  Daily PT/INR Monitor CBC Monitor closely for hematuria and other s/sx of bleeding   Dolly Rias RPh 07/08/2019, 10:10 AM

## 2019-07-08 NOTE — Progress Notes (Signed)
PROGRESS NOTE  EIVAN Day  DOB: 1940/09/19  PCP: Leeroy Cha, MD [id number]  DOA: 07/05/2019 Admitted From: Home  LOS: 2 days   Chief Complaint  Patient presents with  . Fever   Brief narrative: Jay Day is a 79 y.o. 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  On 1/12, patient had a Foley catheter placed for acute urinary retention.  At home, patient started having fever.  EMS noted a fever of 105 reportedly. Patient was brought to the ED on 1/22. In the ED, patient had a temperature of 101.1, WBC count 10.5.  Patient was altered. Work-up showed creatinine elevated to 2.5 from a baseline of normal. Patient was admitted to the hospital for UTI, acute encephalopathy. Urine culture obtained on admission grew Enterococcus faecalis. Blood culture obtained on admission is growing coag negative staph aureus, likely contamination.  Subjective: Patient was seen and examined this morning.  Pleasant elderly Caucasian male.  Propped up in bed.  Not in distress.  No new symptoms.  Assessment/Plan: Enterococcus faecalis UTI associated with recent Foley catheterization -Presented with fever, confusion likely related to UTI probably associated with recent Foley catheterization. -Urine culture grew Enterococcus faecalis.  Currently on IV ampicillin.  Coag negative staph aureus in blood culture -Likely contamination.  I ordered for repeat blood culture today. -Continue to monitor clinically.  Acute metabolic encephalopathy -Secondary to UTI.  Mental status much improved.  Continue to monitor.  Acute kidney injury -Creatinine elevated to 2.5 on admission.  Baseline seems to be normal but it was from more than a year ago.  With IV hydration, creatinine improving, 1.6 today. -Slowdown normal saline to 50 mils per hour. -Repeat labs tomorrow.  Hypertension -I noticed both metoprolol and telmisartan  in his home list.  Patient states that he was recently taken off telmisartan because of kidney injury.  Only metoprolol at home.  Continue same.  Continue to monitor blood pressure.  History of pulmonary embolism and chronic DVT and clotting disorder  -continue Coumadin  History of prolactinoma - continue bromocriptine  Mobility: Encourage ambulation.  PT eval ordered. Diet: Cardiac diet Fluid: Rate is normal secondary to 50 mill per hour today DVT prophylaxis:  On Coumadin.  Pharmacy dosing Code Status:  Full code Family Communication:  None at bedside Expected Discharge:  Hopefully home in 1 to 2 days.  Pending PT eval  Consultants:  None  Procedures:  None  Antimicrobials: Anti-infectives (From admission, onward)   Start     Dose/Rate Route Frequency Ordered Stop   07/07/19 1600  ceFEPIme (MAXIPIME) 2 g in sodium chloride 0.9 % 100 mL IVPB  Status:  Discontinued     2 g 200 mL/hr over 30 Minutes Intravenous Every 12 hours 07/07/19 0951 07/07/19 1404   07/07/19 1600  ampicillin (OMNIPEN) 2 g in sodium chloride 0.9 % 100 mL IVPB     2 g 300 mL/hr over 20 Minutes Intravenous Every 6 hours 07/07/19 1404     07/06/19 0400  ceFEPIme (MAXIPIME) 2 g in sodium chloride 0.9 % 100 mL IVPB  Status:  Discontinued     2 g 200 mL/hr over 30 Minutes Intravenous Daily at bedtime 07/06/19 0245 07/07/19 0951   07/06/19 0130  cefTRIAXone (ROCEPHIN) 2 g in sodium chloride 0.9 % 100 mL IVPB     2 g 200 mL/hr over 30 Minutes Intravenous  Once 07/06/19 0126 07/06/19 0235  Code Status: Full Code   Diet Order            Diet Heart Room service appropriate? Yes; Fluid consistency: Thin  Diet effective now              Infusions:  . sodium chloride 125 mL/hr at 07/08/19 0457  . ampicillin (OMNIPEN) IV 2 g (07/08/19 0935)    Scheduled Meds: . bromocriptine  1.25 mg Oral Once per day on Mon Tue Wed Thu Fri  . Chlorhexidine Gluconate Cloth  6 each Topical Daily  .  dorzolamide-timolol  1 drop Both Eyes BID  . latanoprost  1 drop Both Eyes QHS  . metoprolol succinate  50 mg Oral Daily  . multivitamin with minerals  1 tablet Oral Daily  . warfarin  5 mg Oral ONCE-1800  . Warfarin - Pharmacist Dosing Inpatient   Does not apply q1800    PRN meds: acetaminophen **OR** acetaminophen, ondansetron **OR** ondansetron (ZOFRAN) IV, oxybutynin, zolpidem   Objective: Vitals:   07/08/19 0617 07/08/19 1336  BP: 132/78 111/73  Pulse: 60 (!) 58  Resp: 20 18  Temp: 97.6 F (36.4 C) 98.4 F (36.9 C)  SpO2: 93% 98%    Intake/Output Summary (Last 24 hours) at 07/08/2019 1435 Last data filed at 07/08/2019 1259 Gross per 24 hour  Intake 2561.4 ml  Output 4300 ml  Net -1738.6 ml   Filed Weights   07/06/19 0241  Weight: 86 kg   Weight change:  Body mass index is 25.71 kg/m.   Physical Exam: General exam: Appears calm and comfortable.  Skin: No rashes, lesions or ulcers. HEENT: Atraumatic, normocephalic, supple neck, no obvious bleeding Lungs: Clear to auscultation bilaterally CVS: Regular rate and rhythm, no murmur GI/Abd soft, nontender, nondistended, bowel sound present CNS: Alert, awake, oriented x3 Psychiatry: Mood appropriate Extremities: No pedal edema, no calf tenderness  Data Review: I have personally reviewed the laboratory data and studies available.  Recent Labs  Lab 07/05/19 2346 07/06/19 0610 07/07/19 0541 07/08/19 0528  WBC 10.5 13.5* 13.6* 10.6*  HGB 12.1* 11.7* 10.9* 10.3*  HCT 35.8* 34.6* 31.4* 30.5*  MCV 101.4* 100.0 98.7 100.3*  PLT 113* 96* 94* 97*   Recent Labs  Lab 07/05/19 2346 07/06/19 0610 07/07/19 0541 07/08/19 0528  NA 144 141  --   --   K 4.0 4.2  --   --   CL 113* 111  --   --   CO2 22 20*  --   --   GLUCOSE 157* 111*  --   --   BUN 41* 35*  --   --   CREATININE 2.56* 2.24* 1.88* 1.63*  CALCIUM 9.2 8.3*  --   --     Terrilee Croak, MD  [facility] 07/08/2019

## 2019-07-09 ENCOUNTER — Inpatient Hospital Stay (HOSPITAL_COMMUNITY): Payer: Medicare Other

## 2019-07-09 DIAGNOSIS — I340 Nonrheumatic mitral (valve) insufficiency: Secondary | ICD-10-CM

## 2019-07-09 LAB — CBC WITH DIFFERENTIAL/PLATELET
Abs Immature Granulocytes: 0.1 10*3/uL — ABNORMAL HIGH (ref 0.00–0.07)
Basophils Absolute: 0 10*3/uL (ref 0.0–0.1)
Basophils Relative: 0 %
Eosinophils Absolute: 0.4 10*3/uL (ref 0.0–0.5)
Eosinophils Relative: 6 %
HCT: 30.3 % — ABNORMAL LOW (ref 39.0–52.0)
Hemoglobin: 10.3 g/dL — ABNORMAL LOW (ref 13.0–17.0)
Immature Granulocytes: 1 %
Lymphocytes Relative: 15 %
Lymphs Abs: 1.2 10*3/uL (ref 0.7–4.0)
MCH: 34.1 pg — ABNORMAL HIGH (ref 26.0–34.0)
MCHC: 34 g/dL (ref 30.0–36.0)
MCV: 100.3 fL — ABNORMAL HIGH (ref 80.0–100.0)
Monocytes Absolute: 0.7 10*3/uL (ref 0.1–1.0)
Monocytes Relative: 9 %
Neutro Abs: 5.2 10*3/uL (ref 1.7–7.7)
Neutrophils Relative %: 69 %
Platelets: 116 10*3/uL — ABNORMAL LOW (ref 150–400)
RBC: 3.02 MIL/uL — ABNORMAL LOW (ref 4.22–5.81)
RDW: 12 % (ref 11.5–15.5)
WBC: 7.6 10*3/uL (ref 4.0–10.5)
nRBC: 0 % (ref 0.0–0.2)

## 2019-07-09 LAB — ECHOCARDIOGRAM COMPLETE
Height: 72 in
Weight: 3033.53 oz

## 2019-07-09 LAB — PROTIME-INR
INR: 1.9 — ABNORMAL HIGH (ref 0.8–1.2)
Prothrombin Time: 21.4 seconds — ABNORMAL HIGH (ref 11.4–15.2)

## 2019-07-09 LAB — BASIC METABOLIC PANEL
Anion gap: 7 (ref 5–15)
BUN: 29 mg/dL — ABNORMAL HIGH (ref 8–23)
CO2: 23 mmol/L (ref 22–32)
Calcium: 8.3 mg/dL — ABNORMAL LOW (ref 8.9–10.3)
Chloride: 113 mmol/L — ABNORMAL HIGH (ref 98–111)
Creatinine, Ser: 1.6 mg/dL — ABNORMAL HIGH (ref 0.61–1.24)
GFR calc Af Amer: 48 mL/min — ABNORMAL LOW (ref >60)
GFR calc non Af Amer: 41 mL/min — ABNORMAL LOW (ref >60)
Glucose, Bld: 95 mg/dL (ref 70–99)
Potassium: 3.4 mmol/L — ABNORMAL LOW (ref 3.5–5.1)
Sodium: 143 mmol/L (ref 135–145)

## 2019-07-09 MED ORDER — WARFARIN SODIUM 5 MG PO TABS
5.0000 mg | ORAL_TABLET | Freq: Once | ORAL | Status: AC
  Administered 2019-07-09: 5 mg via ORAL
  Filled 2019-07-09: qty 1

## 2019-07-09 MED ORDER — PERFLUTREN LIPID MICROSPHERE
1.0000 mL | INTRAVENOUS | Status: AC | PRN
  Administered 2019-07-09: 3 mL via INTRAVENOUS
  Filled 2019-07-09: qty 10

## 2019-07-09 NOTE — Progress Notes (Signed)
The patient was seen recently in our office for BPH with significant obstruction with hydronephrosis.  Residual urine volume about 2 L.  He was taught self-catheterization.  Unfortunately, he did develop urinary tract infection with associated sepsis.  This is adequately treated currently.  The patient does wish to get back to self-catheterization.  I think it is fine to remove his Foley catheter and reinitiate self-catheterization while he is here in the hospital.  I will put orders in for that.

## 2019-07-09 NOTE — Progress Notes (Signed)
[place] for [facility]  Indication: hx of PE, chronic DVT, hx of mild stroke  Allergies  Allergen Reactions  . Gluten Meal Other (See Comments)    Gluten Sensitivity (reaction undefined) NO BREAD  . Wheat Bran Other (See Comments)    Reaction undefined    Height: 6' (182.9 cm) Weight: 189 lb 9.5 oz (86 kg) IBW/kg (Calculated) : 77.6   Vital Signs: Temp: 98.3 F (36.8 C) (01/26 0441) Temp Source: Oral (01/26 0441) BP: 109/61 (01/26 0441) Pulse Rate: 62 (01/26 0441)  Labs: Recent Labs    07/07/19 0541 07/07/19 0541 07/08/19 0528 07/09/19 0500  HGB 10.9*   < > 10.3* 10.3*  HCT 31.4*  --  30.5* 30.3*  PLT 94*  --  97* 116*  LABPROT 23.5*  --  21.1* 21.4*  INR 2.1*  --  1.8* 1.9*  CREATININE 1.88*  --  1.63* 1.60*   < > = values in this interval not displayed.    Estimated Creatinine Clearance: 43.1 mL/min (A) (by C-G formula based on SCr of 1.6 mg/dL (H)).   Medical History: Past Medical History:  Diagnosis Date  . Asthma    hx of as child  . Bronchitis    as a child  . Cataract   . Chronic anticoagulation 06/11/2013  . Clotting disorder ([place])   . 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 Elevated PSA    being monitored by physician  . Glaucoma 10/13/2011  . 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 Headache disorder 02/24/2015   occ  . Hypertension    sees [name], primary   . MI (myocardial infarction) ([place]) not sure when   scar tissue saw  . Peyronie's disease   . 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 Pituitary tumor    takes medication to manage  . Prostatitis   . Pulmonary embolism ([place]) yrs ago  . Rash    last 3 months chest and arms, saw md no tx given  . 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 Stroke [facility])  2017   mild cva    Medications: PTA Warfarin dose per med list: 5 mg Sat/Sun, 2.5 mg M/W/Thur and none on Tues. Last dose PTA 1/21  Assessment: 54 yoM on warfarin PTA for hx of PE/DVT and mild stroke who presented to [facility] with confusion, fever. Patient has foley catheter in place. Patient currently being treated for catheter-associated UTI. Pharmacy consulted to dose warfarin while patient in the hospital. Per notes, foley catheter draining red tinged urine on admission.    Today, 07/09/19:  INR = 1.9, subtherapeutic  CBC: Hgb 10.3, Pltc low 116   No bleeding issues noted   Goal of Therapy:  INR 2-3   Plan:  Warfarin 5mg  PO x 1  Daily PT/INR Monitor CBC Monitor closely for hematuria and other s/sx of bleeding   Dolly Rias RPh 07/09/2019, 8:17 AM

## 2019-07-09 NOTE — Progress Notes (Addendum)
PROGRESS NOTE  Jay Day  DOB: 06-Dec-1940  PCP: Leeroy Cha, MD [id number]  DOA: 07/05/2019 Admitted From: Home  LOS: 3 days   Chief Complaint  Patient presents with  . Fever   Brief narrative: Jay Day is a 79 y.o. 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  On 1/12, patient had a Foley catheter placed for acute urinary retention.  It was eventually removed at urologist office.  Patient started doing manual catheterization after that.  In next few days, patient started having fever.  EMS noted a fever of 105 reportedly. Patient was brought to the ED on 1/22. In the ED, patient had a temperature of 101.1, WBC count 10.5.  Patient was altered. Work-up showed creatinine elevated to 2.5 from a baseline of normal. Patient was admitted to the hospital for UTI, acute encephalopathy. Urine culture obtained on admission grew Enterococcus faecalis. Blood culture obtained on admission is growing coag negative staph aureus, likely contamination.  Subjective: Patient was seen and examined this morning.  Pleasant elderly Caucasian male.  Propped up in bed.  Not in distress.  No new symptoms. He is asking if Foley catheter can be removed.  Assessment/Plan: Enterococcus faecalis UTI associated with recent Foley catheterization -Presented with fever, confusion likely related to UTI probably associated with recent Foley catheterization. -Urine culture grew Enterococcus faecalis.  Currently on IV ampicillin.  Coag negative staph aureus in blood culture -Likely contamination.  Repeat blood culture was obtained on 1/25.  Pending report. -Continue to monitor clinically.  Acute on chronic urinary retention -Patient has history of prostate cancer.  Just prior to this hospitalization, patient was doing manual in and out catheterization. -He currently has a Foley catheter in place.  I discussed with his  urologist Dr. Diona Fanti.  Per recommendation, will remove Foley catheter and allow him again for manual in and out catheterization.  Acute metabolic encephalopathy -Secondary to UTI.  Mental status much improved.  Continue to monitor.  Acute kidney injury -Creatinine elevated to 2.5 on admission.  Baseline seems to be normal but it was from more than a year ago.  With IV hydration, creatinine improving, 1.6 today. -Okay to stop IV fluid today -Repeat labs tomorrow.  Hypertension -I noticed both metoprolol and telmisartan in his home list.  Patient states that he was recently taken off telmisartan because of kidney injury.  Only metoprolol at home.  Continue same.  Continue to monitor blood pressure.  History of pulmonary embolism and chronic DVT and clotting disorder  -continue Coumadin  History of prolactinoma - continue bromocriptine  Mobility: Encourage ambulation.  Pending PT eval Diet: Cardiac diet Fluid: IV fluid is stopped today. DVT prophylaxis:  On Coumadin.  Pharmacy dosing Code Status:  Full code Family Communication:  None at bedside Expected Discharge:  Hopefully home in 1 to 2 days.  Pending PT eval  Consultants:  None  Procedures:  None  Antimicrobials: Anti-infectives (From admission, onward)   Start     Dose/Rate Route Frequency Ordered Stop   07/07/19 1600  ceFEPIme (MAXIPIME) 2 g in sodium chloride 0.9 % 100 mL IVPB  Status:  Discontinued     2 g 200 mL/hr over 30 Minutes Intravenous Every 12 hours 07/07/19 0951 07/07/19 1404   07/07/19 1600  ampicillin (OMNIPEN) 2 g in sodium chloride 0.9 % 100 mL IVPB     2 g 300 mL/hr over 20 Minutes Intravenous Every 6 hours 07/07/19 1404  07/06/19 0400  ceFEPIme (MAXIPIME) 2 g in sodium chloride 0.9 % 100 mL IVPB  Status:  Discontinued     2 g 200 mL/hr over 30 Minutes Intravenous Daily at bedtime 07/06/19 0245 07/07/19 0951   07/06/19 0130  cefTRIAXone (ROCEPHIN) 2 g in sodium chloride 0.9 % 100 mL IVPB      2 g 200 mL/hr over 30 Minutes Intravenous  Once 07/06/19 0126 07/06/19 0235        Code Status: Full Code   Diet Order            Diet Heart Room service appropriate? Yes; Fluid consistency: Thin  Diet effective now              Infusions:  . sodium chloride 50 mL/hr at 07/08/19 2133  . ampicillin (OMNIPEN) IV 2 g (07/09/19 0959)    Scheduled Meds: . bromocriptine  1.25 mg Oral Once per day on Mon Tue Wed Thu Fri  . Chlorhexidine Gluconate Cloth  6 each Topical Daily  . dorzolamide-timolol  1 drop Both Eyes BID  . latanoprost  1 drop Both Eyes QHS  . metoprolol succinate  50 mg Oral Daily  . multivitamin with minerals  1 tablet Oral Daily  . warfarin  5 mg Oral ONCE-1800  . Warfarin - Pharmacist Dosing Inpatient   Does not apply q1800    PRN meds: acetaminophen **OR** acetaminophen, ondansetron **OR** ondansetron (ZOFRAN) IV, oxybutynin, zolpidem   Objective: Vitals:   07/09/19 0441 07/09/19 0800  BP: 109/61 123/67  Pulse: 62 61  Resp: 18 16  Temp: 98.3 F (36.8 C) 97.9 F (36.6 C)  SpO2: 98% 99%    Intake/Output Summary (Last 24 hours) at 07/09/2019 1406 Last data filed at 07/09/2019 1106 Gross per 24 hour  Intake --  Output 3950 ml  Net -3950 ml   Filed Weights   07/06/19 0241  Weight: 86 kg   Weight change:  Body mass index is 25.71 kg/m.   Physical Exam: General exam: Appears calm and comfortable.  Not in physical distress. Skin: No rashes, lesions or ulcers. HEENT: Atraumatic, normocephalic, supple neck, no obvious bleeding Lungs: Clear to auscultation bilaterally CVS: Regular rate and rhythm, no murmur GI/Abd soft, nontender, nondistended, bowel sound present CNS: Alert, awake, oriented x3.  Psychiatry: Mood appropriate Extremities: No pedal edema, no calf tenderness  Data Review: I have personally reviewed the laboratory data and studies available.  Recent Labs  Lab 07/05/19 2346 07/06/19 0610 07/07/19 0541 07/08/19 0528  07/09/19 0500  WBC 10.5 13.5* 13.6* 10.6* 7.6  NEUTROABS  --   --   --   --  5.2  HGB 12.1* 11.7* 10.9* 10.3* 10.3*  HCT 35.8* 34.6* 31.4* 30.5* 30.3*  MCV 101.4* 100.0 98.7 100.3* 100.3*  PLT 113* 96* 94* 97* 116*   Recent Labs  Lab 07/05/19 2346 07/06/19 0610 07/07/19 0541 07/08/19 0528 07/09/19 0500  NA 144 141  --   --  143  K 4.0 4.2  --   --  3.4*  CL 113* 111  --   --  113*  CO2 22 20*  --   --  23  GLUCOSE 157* 111*  --   --  95  BUN 41* 35*  --   --  29*  CREATININE 2.56* 2.24* 1.88* 1.63* 1.60*  CALCIUM 9.2 8.3*  --   --  8.3*    Jay Croak, MD  [facility] 07/09/2019

## 2019-07-09 NOTE — Care Management Important Message (Signed)
Important Message  Patient Details IM Letter given to [name] SW Case Manager to present to the Patient  Name: Jay Day MRN: [id number] Date of Birth: 06-29-1940   Medicare Important Message Given:  Yes     [name] 07/09/2019, 11:02 AM

## 2019-07-09 NOTE — Plan of Care (Signed)
  Problem: Education: Goal: Knowledge of General Education information will improve Description Including pain rating scale, medication(s)/side effects and non-pharmacologic comfort measures Outcome: Progressing   

## 2019-07-09 NOTE — Progress Notes (Signed)
  Echocardiogram 2D Echocardiogram has been performed.  Randa Lynn Claritza July 07/09/2019, 3:09 PM

## 2019-07-09 NOTE — Evaluation (Signed)
Physical Therapy Evaluation Patient Details Name: Jay Day MRN: [id number] DOB: 1941/04/30 Today's Date: 07/09/2019   History of Present Illness  Jay Day is a 79 y.o. 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  On 1/12, patient had a Foley catheter placed for acute urinary retention.  At home, patient started having fever.  EMS noted a fever of 105 reportedly. Patient was brought to the ED on 1/22 with temperature for 101.1. Patient was admitted to the hospital for UTI, acute encephalopathy.    Clinical Impression  Jay Day is 79 y.o. male admitted with above HPI and diagnosis. Patient is currently limited by functional impairments below (see PT problem list). Patient lives with his wife and is independent and very active at baseline. Patient does not currently require any skilled Acute PT interventions and is safe to mobilize with RN staff as able. Please re-consult if there is a change in functional status.     Follow Up Recommendations No PT follow up    Equipment Recommendations  None recommended by PT    Recommendations for Other Services       Precautions / Restrictions Restrictions Weight Bearing Restrictions: No      Mobility  Bed Mobility Overal bed mobility: Modified Independent       Transfers Overall transfer level: Modified independent         Ambulation/Gait Ambulation/Gait assistance: Modified independent (Device/Increase time);Supervision Gait Distance (Feet): 280 Feet Assistive device: None;IV Pole Gait Pattern/deviations: Step-through pattern;Decreased stride length;Narrow base of support;Drifts right/left Gait velocity: WNLs   General Gait Details: pt able to push and manage IV pole and negotiate obstacles in hallway. he has slight stager but is able to steady himeself with no LOB. He was slightly unsteady without use of IV pole but is able to ambulate Mod I with  use of pole.  Stairs            Wheelchair Mobility    Modified Rankin (Stroke Patients Only)       Balance Overall balance assessment: Modified Independent   Sitting balance-Leahy Scale: Normal     Standing balance support: Single extremity supported;No upper extremity supported;During functional activity Standing balance-Leahy Scale: Good   Single Leg Stance - Right Leg: 15 Single Leg Stance - Left Leg: 20         High level balance activites: Backward walking High Level Balance Comments: pt able to complete tandem gait with use of IV poll and backwards gait with IV pole to steady. no overt LOB noted.             Pertinent Vitals/Pain Pain Assessment: No/denies pain    Home Living Family/patient expects to be discharged to:: Private residence Living Arrangements: Spouse/significant other Available Help at Discharge: Family;Available 24 hours/day Type of Home: House Home Access: Stairs to enter Entrance Stairs-Rails: None Entrance Stairs-Number of Steps: 30 at front; have an alley (2 steps no rails) Home Layout: Two level;Bed/bath upstairs Home Equipment: None      Prior Function Level of Independence: Independent         Comments: pt reports he was swimming 3x/week until about 3 weeks ago and then started taking daily walks with his wife. he feels limited by his foley with activity.     Hand Dominance   Dominant Hand: Left    Extremity/Trunk Assessment   Upper Extremity Assessment Upper Extremity Assessment: Overall WFL for tasks assessed  Lower Extremity Assessment Lower Extremity Assessment: Overall WFL for tasks assessed    Cervical / Trunk Assessment Cervical / Trunk Assessment: Normal  Communication   Communication: No difficulties  Cognition Arousal/Alertness: Awake/alert Behavior During Therapy: WFL for tasks assessed/performed Overall Cognitive Status: Within Functional Limits for tasks assessed           General  Comments General comments (skin integrity, edema, etc.): 5x Sit<>Stand test: 14.2 seconds without use of UE's for power up.        Assessment/Plan    PT Assessment Patent does not need any further PT services  PT Problem List         PT Treatment Interventions      PT Goals (Current goals can be found in the Care Plan section)  Acute Rehab PT Goals Patient Stated Goal: to get home and get rid of his foley PT Goal Formulation: All assessment and education complete, DC therapy Time For Goal Achievement: 07/16/19 Potential to Achieve Goals: Good    Frequency  1x eva/treat    AM-PAC PT "6 Clicks" Mobility  Outcome Measure Help needed turning from your back to your side while in a flat bed without using bedrails?: None Help needed moving from lying on your back to sitting on the side of a flat bed without using bedrails?: None Help needed moving to and from a bed to a chair (including a wheelchair)?: A Little Help needed standing up from a chair using your arms (e.g., wheelchair or bedside chair)?: A Little Help needed to walk in hospital room?: A Little Help needed climbing 3-5 steps with a railing? : A Little 6 Click Score: 20    End of Session Equipment Utilized During Treatment: Gait belt Activity Tolerance: Patient tolerated treatment well Patient left: with call bell/phone within reach;in chair Nurse Communication: Mobility status PT Visit Diagnosis: Unsteadiness on feet (R26.81)    Time: [id number] PT Time Calculation (min) (ACUTE ONLY): 22 min   Charges:   PT Evaluation $PT Eval Low Complexity: 1 Low          Verner Mould, DPT Physical Therapist with [facility] [phone]  07/09/2019 2:25 PM

## 2019-07-10 LAB — BASIC METABOLIC PANEL
Anion gap: 9 (ref 5–15)
BUN: 32 mg/dL — ABNORMAL HIGH (ref 8–23)
CO2: 23 mmol/L (ref 22–32)
Calcium: 8.5 mg/dL — ABNORMAL LOW (ref 8.9–10.3)
Chloride: 112 mmol/L — ABNORMAL HIGH (ref 98–111)
Creatinine, Ser: 1.71 mg/dL — ABNORMAL HIGH (ref 0.61–1.24)
GFR calc Af Amer: 44 mL/min — ABNORMAL LOW (ref >60)
GFR calc non Af Amer: 38 mL/min — ABNORMAL LOW (ref >60)
Glucose, Bld: 93 mg/dL (ref 70–99)
Potassium: 3.5 mmol/L (ref 3.5–5.1)
Sodium: 144 mmol/L (ref 135–145)

## 2019-07-10 LAB — CBC WITH DIFFERENTIAL/PLATELET
Abs Immature Granulocytes: 0.03 10*3/uL (ref 0.00–0.07)
Basophils Absolute: 0 10*3/uL (ref 0.0–0.1)
Basophils Relative: 1 %
Eosinophils Absolute: 0.4 10*3/uL (ref 0.0–0.5)
Eosinophils Relative: 6 %
HCT: 28.9 % — ABNORMAL LOW (ref 39.0–52.0)
Hemoglobin: 9.7 g/dL — ABNORMAL LOW (ref 13.0–17.0)
Immature Granulocytes: 1 %
Lymphocytes Relative: 22 %
Lymphs Abs: 1.4 10*3/uL (ref 0.7–4.0)
MCH: 33.8 pg (ref 26.0–34.0)
MCHC: 33.6 g/dL (ref 30.0–36.0)
MCV: 100.7 fL — ABNORMAL HIGH (ref 80.0–100.0)
Monocytes Absolute: 0.6 10*3/uL (ref 0.1–1.0)
Monocytes Relative: 10 %
Neutro Abs: 3.8 10*3/uL (ref 1.7–7.7)
Neutrophils Relative %: 60 %
Platelets: 122 10*3/uL — ABNORMAL LOW (ref 150–400)
RBC: 2.87 MIL/uL — ABNORMAL LOW (ref 4.22–5.81)
RDW: 11.9 % (ref 11.5–15.5)
WBC: 6.2 10*3/uL (ref 4.0–10.5)
nRBC: 0 % (ref 0.0–0.2)

## 2019-07-10 LAB — PROTIME-INR
INR: 2 — ABNORMAL HIGH (ref 0.8–1.2)
Prothrombin Time: 22.7 seconds — ABNORMAL HIGH (ref 11.4–15.2)

## 2019-07-10 MED ORDER — WARFARIN SODIUM 5 MG PO TABS: 5.0000 mg | ORAL_TABLET | Freq: Once | ORAL | Status: DC

## 2019-07-10 MED ORDER — HYDROCODONE-ACETAMINOPHEN 5-325 MG PO TABS: 1.0000 | ORAL_TABLET | Freq: Four times a day (QID) | ORAL | 0 refills | Status: AC | PRN

## 2019-07-10 MED ORDER — AMOXICILLIN-POT CLAVULANATE 875-125 MG PO TABS: 1.0000 | ORAL_TABLET | Freq: Two times a day (BID) | ORAL | 0 refills | Status: AC

## 2019-07-10 MED ORDER — HYDROCODONE-ACETAMINOPHEN 5-325 MG PO TABS: 1.0000 | ORAL_TABLET | Freq: Four times a day (QID) | ORAL | Status: DC | PRN

## 2019-07-10 NOTE — Plan of Care (Signed)
  Problem: Education: Goal: Knowledge of General Education information will improve Description: Including pain rating scale, medication(s)/side effects and non-pharmacologic comfort measures Outcome: Adequate for Discharge   

## 2019-07-10 NOTE — Consult Note (Signed)
Urology Consult  Referring physician: B Malva Cogan Reason for referral: retention  Chief Complaint: retention   History of Present Illness:  Patient followed by Dr. 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  He has been taught how to self catheterize  But has trouble.  He has gone to the office recently and an 44 [place] coude catheter was inserted.  Could not cath in hspital and scanned for one liter  Past Medical History:  Diagnosis Date  . Asthma    hx of as child  . Bronchitis    as a child  . Cataract   . Chronic anticoagulation 06/11/2013  . Clotting disorder ([place])   . 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 Elevated PSA    being monitored by physician  . Glaucoma 10/13/2011  . 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 Headache disorder 02/24/2015   occ  . Hypertension    sees [name], primary   . MI (myocardial infarction) ([place]) not sure when   scar tissue saw  . Peyronie's disease   . Pituitary adenoma ([place]) 10/13/2011  . Pituitary tumor    takes medication to manage  . Prostatitis   . Pulmonary embolism ([place]) yrs ago  . Rash    last 3 months chest and arms, saw md no tx given  . 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 Stroke [facility]) 2017   mild cva   Past Surgical History:  Procedure Laterality Date  . COLONOSCOPY N/A 08/22/2016   Procedure: COLONOSCOPY;  Surgeon: Garlan Fair, MD;  Location: [facility];  Service: Endoscopy;  Laterality: N/A;  . colonscopy  2007  . EYE SURGERY     bilateral cataract surgery  . HERNIA REPAIR  1960  . INGUINAL HERNIA REPAIR  08/12/2011   Procedure: LAPAROSCOPIC INGUINAL HERNIA;  Surgeon: [name], MD;  Location: [place];  Service: General;  Laterality: Left;  . [place]    left   . LEFT HEART CATHETERIZATION WITH CORONARY ANGIOGRAM N/A 05/06/2014   Procedure: LEFT HEART CATHETERIZATION WITH CORONARY ANGIOGRAM;  Surgeon: [name], MD;  Location: [facility] CATH LAB;  Service: Cardiovascular;  Laterality: N/A;  . TONSILLECTOMY     as a child    Medications: I have reviewed the patient's current medications. Allergies:  Allergies  Allergen Reactions  . Gluten Meal Other (See Comments)    Gluten Sensitivity (reaction undefined) NO BREAD  . Wheat Bran Other (See Comments)    Reaction undefined    Family History  Problem Relation Age of Onset  . Pulmonary embolism Mother   . Kidney failure Mother   . Hypertension Mother   . Kidney Stones Mother   . Pulmonary embolism Father   . Prostate cancer Father   . Pulmonary embolism Maternal Grandmother   . Pulmonary embolism Maternal Grandfather   . Pulmonary embolism Paternal Grandmother   . Pulmonary embolism Paternal Grandfather    Social History:  reports that he quit smoking about 49 years ago. His smoking use included cigarettes. He has a 1.50 pack-year smoking history. He has never used smokeless tobacco. He reports that he does not drink alcohol or use drugs.  ROS: All systems are reviewed and negative except as noted. Rest  neg  Physical Exam:  Vital signs in last 24 hours: Temp:  [98.3 F (36.8 C)-98.4 F (36.9 C)] 98.3 F (36.8 C) (01/27 0506) Pulse Rate:  [57-63] 63 (01/27 0506) Resp:  [18] 18 (01/27 0506) BP: (115-157)/(70-83) 115/70 (01/27 0506) SpO2:  [96 %-98 %] 96 % (01/27 0506)  Cardiovascular: Skin warm; not flushed Respiratory: Breaths quiet; no shortness of breath Abdomen: No masses Neurological: Normal sensation to touch Musculoskeletal: Normal motor function arms and legs Lymphatics: No inguinal adenopathy Skin: No rashes Genitourinary:no distress  Laboratory Data:  Results for orders placed or performed during the hospital encounter of 07/05/19 (from the past 72 hour(s))   Protime-INR     Status: Abnormal   Collection Time: 07/08/19  5:28 AM  Result Value Ref Range   Prothrombin Time 21.1 (H) 11.4 - 15.2 seconds   INR 1.8 (H) 0.8 - 1.2    Comment: (NOTE) INR goal varies based on device and disease states. Performed at [facility], [place] [address], [place], [place] [address]   CBC     Status: Abnormal   Collection Time: 07/08/19  5:28 AM  Result Value Ref Range   WBC 10.6 (H) 4.0 - 10.5 K/uL   RBC 3.04 (L) 4.22 - 5.81 MIL/uL   Hemoglobin 10.3 (L) 13.0 - 17.0 g/dL   HCT 30.5 (L) 39.0 - 52.0 %   MCV 100.3 (H) 80.0 - 100.0 fL   MCH 33.9 26.0 - 34.0 pg   MCHC 33.8 30.0 - 36.0 g/dL   RDW 12.0 11.5 - 15.5 %   Platelets 97 (L) 150 - 400 K/uL    Comment: Immature Platelet Fraction may be clinically indicated, consider ordering this additional test [id number] CONSISTENT WITH PREVIOUS RESULT    nRBC 0.0 0.0 - 0.2 %    Comment: Performed at [facility], [place] [address] [place], [place] [address]  Creatinine, serum     Status: Abnormal   Collection Time: 07/08/19  5:28 AM  Result Value Ref Range   Creatinine, Ser 1.63 (H) 0.61 - 1.24 mg/dL   GFR calc non Af Amer 40 (L) >60 mL/min   GFR calc Af Amer 47 (L) >60 mL/min    Comment: Performed at [facility], [place] [address], [place], [place] [address]  Culture, blood (routine x 2)     Status: None (Preliminary result)   Collection Time: 07/08/19  8:52 AM   Specimen: BLOOD RIGHT HAND  Result Value Ref Range   Specimen Description      BLOOD RIGHT HAND Performed at [place] [address] [place], [place] [address]    Special Requests      BOTTLES DRAWN AEROBIC AND ANAEROBIC Blood Culture adequate volume Performed at [place] [address], [place], [place] [address]    Culture      NO GROWTH 2 DAYS Performed at [facility], [place] [address], [place], [place] [address]    Report Status  PENDING   Culture, blood (routine x 2)     Status: None (Preliminary result)   Collection Time: 07/08/19  8:52 AM   Specimen: BLOOD LEFT HAND  Result Value Ref Range   Specimen Description      BLOOD LEFT HAND Performed at [place] [address] [place], [place] [address]    Special Requests      BOTTLES DRAWN AEROBIC AND ANAEROBIC Blood Culture adequate volume Performed at [facility], [place] [address] [place], [place] [address]    Culture      NO GROWTH 2 DAYS Performed at [facility], [place] [address], [place], [place] [address]    Report Status PENDING   Protime-INR     Status: Abnormal   Collection Time: 07/09/19  5:00 AM  Result Value Ref Range   Prothrombin Time 21.4 (H) 11.4 - 15.2 seconds   INR 1.9 (H) 0.8 - 1.2    Comment: (NOTE) INR goal varies based on device and disease states. Performed at [facility], [place] [address] [place], [place] [id number]   Basic metabolic panel     Status: Abnormal   Collection Time: 07/09/19  5:00 AM  Result Value Ref Range   Sodium 143 135 - 145 mmol/L   Potassium 3.4 (L) 3.5 - 5.1 mmol/L   Chloride 113 (H) 98 - 111 mmol/L   CO2 23 22 - 32 mmol/L   Glucose, Bld 95 70 - 99 mg/dL   BUN 29 (H) 8 - 23 mg/dL   Creatinine, Ser 1.60 (H) 0.61 - 1.24 mg/dL   Calcium 8.3 (L) 8.9 - 10.3 mg/dL   GFR calc non Af Amer 41 (L) >60 mL/min   GFR calc Af Amer 48 (L) >60 mL/min   Anion gap 7 5 - 15    Comment: Performed at [place] [address] [place], [place] [address]  CBC with Differential/Platelet     Status: Abnormal   Collection Time: 07/09/19  5:00 AM  Result Value Ref Range   WBC 7.6 4.0 - 10.5 K/uL   RBC 3.02 (L) 4.22 - 5.81 MIL/uL   Hemoglobin 10.3 (L) 13.0 - 17.0 g/dL   HCT 30.3 (L) 39.0 - 52.0 %   MCV 100.3 (H) 80.0 - 100.0 fL   MCH 34.1 (H) 26.0 - 34.0 pg   MCHC 34.0 30.0 - 36.0 g/dL   RDW 12.0 11.5 - 15.5 %   Platelets 116 (L) 150 -  400 K/uL    Comment: REPEATED TO VERIFY Immature Platelet Fraction may be clinically indicated, consider ordering this additional test [id number] CONSISTENT WITH PREVIOUS RESULT    nRBC 0.0 0.0 - 0.2 %   Neutrophils Relative % 69 %   Neutro Abs 5.2 1.7 - 7.7 K/uL   Lymphocytes Relative 15 %   Lymphs Abs 1.2 0.7 - 4.0 K/uL   Monocytes Relative 9 %   Monocytes Absolute 0.7 0.1 - 1.0 K/uL   Eosinophils Relative 6 %   Eosinophils Absolute 0.4 0.0 - 0.5 K/uL   Basophils Relative 0 %   Basophils Absolute 0.0 0.0 - 0.1 K/uL   Immature Granulocytes 1 %   Abs Immature Granulocytes 0.10 (H) 0.00 - 0.07 K/uL    Comment: Performed at [facility], [place] [address] [place], [place] [address]  Protime-INR     Status: Abnormal   Collection Time: 07/10/19  4:29 AM  Result Value Ref Range   Prothrombin Time 22.7 (H) 11.4 - 15.2 seconds   INR 2.0 (H) 0.8 - 1.2    Comment: (NOTE) INR goal varies based on device and disease states. Performed at [facility], [place] [address], [place], [place] [id number]   Basic metabolic panel     Status: Abnormal   Collection Time: 07/10/19  4:29 AM  Result Value Ref Range   Sodium 144 135 - 145 mmol/L   Potassium 3.5 3.5 - 5.1 mmol/L  Chloride 112 (H) 98 - 111 mmol/L   CO2 23 22 - 32 mmol/L   Glucose, Bld 93 70 - 99 mg/dL   BUN 32 (H) 8 - 23 mg/dL   Creatinine, Ser 1.71 (H) 0.61 - 1.24 mg/dL   Calcium 8.5 (L) 8.9 - 10.3 mg/dL   GFR calc non Af Amer 38 (L) >60 mL/min   GFR calc Af Amer 44 (L) >60 mL/min   Anion gap 9 5 - 15    Comment: Performed at [facility] [place] [address] [place], [place] [address]  CBC with Differential/Platelet     Status: Abnormal   Collection Time: 07/10/19  4:29 AM  Result Value Ref Range   WBC 6.2 4.0 - 10.5 K/uL   RBC 2.87 (L) 4.22 - 5.81 MIL/uL   Hemoglobin 9.7 (L) 13.0 - 17.0 g/dL   HCT 28.9 (L) 39.0 - 52.0 %   MCV 100.7 (H) 80.0 - 100.0 fL   MCH 33.8 26.0 - 34.0  pg   MCHC 33.6 30.0 - 36.0 g/dL   RDW 11.9 11.5 - 15.5 %   Platelets 122 (L) 150 - 400 K/uL   nRBC 0.0 0.0 - 0.2 %   Neutrophils Relative % 60 %   Neutro Abs 3.8 1.7 - 7.7 K/uL   Lymphocytes Relative 22 %   Lymphs Abs 1.4 0.7 - 4.0 K/uL   Monocytes Relative 10 %   Monocytes Absolute 0.6 0.1 - 1.0 K/uL   Eosinophils Relative 6 %   Eosinophils Absolute 0.4 0.0 - 0.5 K/uL   Basophils Relative 1 %   Basophils Absolute 0.0 0.0 - 0.1 K/uL   Immature Granulocytes 1 %   Abs Immature Granulocytes 0.03 0.00 - 0.07 K/uL    Comment: Performed at [facility], [place] [address], [place], [place] [address]   Recent Results (from the past 240 hour(s))  Culture, blood (Routine X 2) w Reflex to ID Panel     Status: Abnormal   Collection Time: 07/05/19 11:46 PM   Specimen: BLOOD  Result Value Ref Range Status   Specimen Description   Final    BLOOD RIGHT ANTECUBITAL Performed at [facility], [place] [address], [place], [place] [address]    Special Requests   Final    BOTTLES DRAWN AEROBIC AND ANAEROBIC Blood Culture adequate volume Performed at [place] [address], [place], [place] [address]    Culture  Setup Time   Final    GRAM POSITIVE COCCI IN CLUSTERS AEROBIC BOTTLE ONLY CRITICAL RESULT CALLED TO, READ BACK BY AND VERIFIED WITH: M SWAYNE,PHARMD AT 2050 07/06/19 BY L BENFIELD    Culture (A)  Final    STAPHYLOCOCCUS SPECIES (COAGULASE NEGATIVE) THE SIGNIFICANCE OF ISOLATING THIS ORGANISM FROM A SINGLE SET OF BLOOD CULTURES WHEN MULTIPLE SETS ARE DRAWN IS UNCERTAIN. PLEASE NOTIFY THE MICROBIOLOGY DEPARTMENT WITHIN ONE WEEK IF SPECIATION AND SENSITIVITIES ARE REQUIRED. Performed at [facility], [place] [address] [place], [place] [address]    Report Status 07/08/2019 FINAL  Final  Culture, blood (Routine X 2) w Reflex to ID Panel     Status: None (Preliminary result)   Collection Time: 07/05/19 11:46 PM   Specimen: BLOOD RIGHT HAND   Result Value Ref Range Status   Specimen Description   Final    BLOOD RIGHT HAND Performed at [place] [address] [place], [place] [address]    Special Requests   Final  BOTTLES DRAWN AEROBIC AND ANAEROBIC Blood Culture results may not be optimal due to an inadequate volume of blood received in culture bottles Performed at [facility], [place] [address], [place], [place] [address]    Culture   Final    NO GROWTH 4 DAYS Performed at [facility], [place] [address], [place], [place] [address]    Report Status PENDING  Incomplete  Culture, Urine     Status: Abnormal   Collection Time: 07/06/19  2:07 AM   Specimen: Urine, Random  Result Value Ref Range Status   Specimen Description   Final    URINE, RANDOM Performed at [place] [address], [place], [place] [address]    Special Requests   Final    NONE Performed at [facility], [place] [address] [place], [place] [address]    Culture 80,000 COLONIES/mL ENTEROCOCCUS FAECALIS (A)  Final   Report Status 07/08/2019 FINAL  Final   Organism ID, Bacteria ENTEROCOCCUS FAECALIS (A)  Final      Susceptibility   Enterococcus faecalis - MIC*    AMPICILLIN <=2 SENSITIVE Sensitive     NITROFURANTOIN <=16 SENSITIVE Sensitive     VANCOMYCIN 1 SENSITIVE Sensitive     * 80,000 COLONIES/mL ENTEROCOCCUS FAECALIS  SARS CORONAVIRUS 2 (TAT 6-24 HRS) Nasopharyngeal Nasopharyngeal Swab     Status: None   Collection Time: 07/06/19  2:19 AM   Specimen: Nasopharyngeal Swab  Result Value Ref Range Status   SARS Coronavirus 2 NEGATIVE NEGATIVE Final    Comment: (NOTE) SARS-CoV-2 target nucleic acids are NOT DETECTED. The SARS-CoV-2 RNA is generally detectable in upper and lower respiratory specimens during the acute phase of infection. Negative results do not preclude SARS-CoV-2 infection, do not rule out co-infections with other pathogens, and should not be  used as the sole basis for treatment or other patient management decisions. Negative results must be combined with clinical observations, patient history, and epidemiological information. The expected result is Negative. Fact Sheet for Patients: SugarRoll.be Fact Sheet for Healthcare Providers: https://www.woods-mathews.com/ This test is not yet approved or cleared by the [place] FDA and  has been authorized for detection and/or diagnosis of SARS-CoV-2 by FDA under an Emergency Use Authorization (EUA). This EUA will remain  in effect (meaning this test can be used) for the duration of the COVID-19 declaration under Section 56 4(b)(1) of the Act, 21 U.S.C. section 360bbb-3(b)(1), unless the authorization is terminated or revoked sooner. Performed at [facility], [place] [address] [place], [place] [address]   Culture, blood (routine x 2)     Status: None (Preliminary result)   Collection Time: 07/08/19  8:52 AM   Specimen: BLOOD RIGHT HAND  Result Value Ref Range Status   Specimen Description   Final    BLOOD RIGHT HAND Performed at [place] [address], [place], [place] [address]    Special Requests   Final    BOTTLES DRAWN AEROBIC AND ANAEROBIC Blood Culture adequate volume Performed at [place] [address] [place], [place] [address]    Culture   Final    NO GROWTH 2 DAYS Performed at [place] [address], [place], [place] [address]    Report Status PENDING  Incomplete  Culture, blood (routine x 2)     Status: None (Preliminary result)   Collection Time: 07/08/19  8:52 AM   Specimen: BLOOD LEFT HAND  Result Value  Ref Range Status   Specimen Description   Final    BLOOD LEFT HAND Performed at [place] [address], [place], [place] [address]    Special Requests   Final    BOTTLES DRAWN AEROBIC AND ANAEROBIC Blood Culture adequate  volume Performed at [place] [address], [place], [place] [address]    Culture   Final    NO GROWTH 2 DAYS Performed at [place] [address] [place],  [address]    Report Status PENDING  Incomplete   Creatinine: Recent Labs    07/05/19 2346 07/06/19 0610 07/07/19 0541 07/08/19 0528 07/09/19 0500 07/10/19 0429  CREATININE 2.56* 2.24* 1.88* 1.63* 1.60* 1.71*    Xrays: See report/chart none  Impression/Assessment:  retention  Plan:  Failed coude 16 and 18 with blood from bladder neck restriction Cystoscopy: normal urethra and narrow bladder neck easy to enter full bladder; guidewire; conucil 18 Fr easy to pass; send home with foley and treat UTI  Jay Day 07/10/2019, 1:42 PM

## 2019-07-10 NOTE — Discharge Summary (Addendum)
Physician Discharge Summary  Jay Day [id number] DOB: Apr 13, 1941 DOA: 07/05/2019  PCP: Leeroy Cha, MD  Admit date: 07/05/2019 Discharge date: 07/10/2019  Admitted From: Home Discharge disposition: Home   Code Status: Full Code  Diet Recommendation: Cardiac diet/low-sodium diet   Recommendations for Outpatient Follow-Up:   1. Follow-up with PCP as an outpatient 2. 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  On 1/12, patient had a Foley catheter placed for acute urinary retention.  It was eventually removed at urologist office.  Patient started doing manual catheterization after that.  In next few days, patient started having fever.  EMS noted a fever of 105 reportedly. Patient was brought to the ED on 1/22. In the ED, patient had a temperature of 101.1, WBC count 10.5.  Patient was altered. Work-up showed creatinine elevated to 2.5 from a baseline of normal. Patient was admitted to the hospital for UTI, acute encephalopathy. Urine culture obtained on admission grew Enterococcus faecalis. Blood culture obtained on admission is growing coag negative staph aureus, likely contamination.  Hospital Course:  Enterococcus faecalis UTI associated with recent Foley catheterization -Presented with fever, confusion likely related to UTI probably associated with recent Foley catheterization. -Urine culture grew Enterococcus faecalis. Currently on IV ampicillin. -Plan to  discharge on 3 more days of oral Augmentin with probiotics.  Coag negative staph aureus in blood culture -Likely contamination.  Repeat blood culture was obtained on 1/25.    Did not show any growth. -Continue to monitor clinically.  Acute on chronic urinary retention -Patient has history of prostate cancer.  Just prior to this hospitalization, patient was doing manual in and out catheterization. -He currently has a Foley catheter in place. 1/26 I discussed with his urologist Dr. Diona Fanti.  Per recommendation,  Foley catheter was removed.  Patient tried manual in and out self catheterization but failed. -Bladder scan this morning noted 1000 mL of urine retention.  Nurses were unable to put in a Foley catheter. -Urologist Dr. Matilde Sprang put in a Foley catheter at bedside.  No need of CBI.  Discussed with urologist.  Patient is good for discharge.  Patient agrees with the plan to go home with Foley catheter in place and follow-up with urologist as an outpatient.  Acute metabolic encephalopathy -Secondary to UTI.  Mental status much improved.  Continue to monitor.  Acute kidney injury -Creatinine elevated to 2.5 on admission.  Baseline seems to be normal but it was from more than a year ago.  With IV hydration, creatinine improving, 1.7 today. -Continue to monitor as an outpatient.  Hypertension -I noticed both metoprolol and telmisartan in his home list.  Patient states that he was recently taken off telmisartan because of kidney injury.  Only metoprolol at home.  Continue same.  Continue to monitor blood pressure at home.Marland Kitchen  History of pulmonary embolism and chronic DVT -Is been chronically on Coumadin.  INR slightly subtherapeutic while on antibiotics.  He does not have any active DVT or PE.  Okay to continue same  dose of Coumadin for now.  Expect improvement in INR to therapeutic level once antibiotic course is completed.  History of prolactinoma - continue bromocriptine.  Okay to  discharge to home today.  Subjective:  Seen and examined this morning.  Pleasant, elderly Caucasian male.  Not in distress.  No new symptoms.  Feels ready to go home today.  Discharge Exam:   Vitals:   07/09/19 1600 07/09/19 2142 07/10/19 0506 07/10/19 1354  BP: (!) 157/83 123/71 115/70 107/71  Pulse: (!) 57 60 63 (!) 54  Resp: 18 18 18 18   Temp:  98.4 F (36.9 C) 98.3 F (36.8 C) 98 F (36.7 C)  TempSrc:  Oral Oral Oral  SpO2: 97% 98% 96% 99%  Weight:      Height:        Body mass index is 25.71 kg/m.  General exam: Appears calm and comfortable.  Skin: No rashes, lesions or ulcers. HEENT: Atraumatic, normocephalic, supple neck, no obvious bleeding Lungs: Clear to auscultation bilaterally CVS: Regular rate and rhythm, no murmur GI/Abd soft, nontender, nondistended, bowel sound present CNS: Alert, awake oriented x3 Psychiatry: Mood appropriate Extremities: No pedal edema, no calf tenderness  Discharge Instructions:  Wound care: None Discharge Instructions    Diet - low sodium heart healthy   Complete by: As directed    Increase activity slowly   Complete by: As directed      Follow-up Information    Leeroy Cha, MD Follow up.   Specialty: Internal Medicine Contact information: [address] [place] [address] [phone]        Jerline Pain, MD .   Specialty: Cardiology Contact information: [phone] [place] [address] [phone]          Allergies as of 07/10/2019      Reactions   Gluten Meal Other (See Comments)   Gluten Sensitivity (reaction undefined) NO BREAD   Wheat Bran Other (See Comments)   Reaction undefined      Medication List    TAKE these medications   amoxicillin-clavulanate 875-125 MG tablet Commonly known as: Augmentin Take 1 tablet by mouth every 12 (twelve) hours for 3 days.   bromocriptine 2.5 MG tablet Commonly known as: PARLODEL TAKE 1/2 TABLET BY MOUTH 5  TIMES A WEEK What changed:   how much to take  how to take this  when to take this  additional instructions   dorzolamide-timolol 22.3-6.8 MG/ML ophthalmic solution Commonly known as: COSOPT Place 1 drop into both eyes 2 (two) times daily.   fluticasone 50 MCG/ACT nasal spray Commonly known as: FLONASE Place 1 spray into both nostrils daily.   HYDROcodone-acetaminophen 5-325 MG tablet Commonly known as: NORCO/VICODIN Take 1 tablet by mouth every 6 (six) hours as needed for up to 3 days for moderate pain.   latanoprost 0.005 % ophthalmic solution Commonly known as: XALATAN Place 1 drop into both eyes at bedtime.   metoprolol succinate 25 MG 24 hr tablet Commonly known as: TOPROL-XL Take 50 mg by mouth daily.   multivitamin with minerals Tabs tablet Take 1 tablet by mouth daily.   oxybutynin 5 MG tablet Commonly known as: DITROPAN Take 5-10 mg by mouth every 6 (six) hours as needed for bladder spasms.   PROBIOTIC ADVANCED PO Take 1 capsule by mouth daily.   telmisartan 40 MG tablet Commonly known as: Micardis Take 1 tablet (40 mg total) by mouth daily.   warfarin 5 MG tablet Commonly known  as: COUMADIN Start from tomorrow What changed:   how much to take  how to take this  when to take this  additional instructions   Zinc 22.5 MG Tabs Take 22.5 mg by mouth daily.       Time coordinating discharge: 35 minutes  The results of significant diagnostics from this hospitalization (including imaging, microbiology, ancillary and laboratory) are listed below for reference.    Procedures and Diagnostic Studies:   US RENAL  Result Date: 07/06/2019 CLINICAL DATA:  Acute kidney injury, fever, Foley catheter EXAM: RENAL / URINARY TRACT ULTRASOUND COMPLETE COMPARISON:  06/25/2019 FINDINGS: Right Kidney: Renal measurements: 12.6 x 8.1 x 5.2 cm = volume: 277 mL. Normal parenchymal echogenicity. Large exophytic cyst with possible mural nodule (image 7), measuring  16.3 x 10.2 x 10.1 cm, rising from the upper pole/interpolar region of the kidney. Additional 3.2 x 2.2 x 2.7 cm upper pole cyst, predominantly simple. Mild hydronephrosis. Left Kidney: Renal measurements: 10.9 x 5.9 x 6.0 cm = volume: 202 mL. Normal parenchymal echogenicity. Mild hydronephrosis. Bladder: Thick-walled with indwelling Foley catheter. Other: Calcified splenic granulomata. IMPRESSION: 16.3 cm exophytic right renal cyst with mural nodularity. This was partially visualized dating back to at least 2012 (on CT chest) and is therefore not acute. Follow-up CT or MRI abdomen with/without contrast is suggested in 4-6 weeks when patient's renal function improves. If GFR remains below 30, consider follow-up MRI abdomen without contrast. Mild bilateral hydronephrosis. This may be related to chronic bladder outlet obstruction. Thick-walled bladder with indwelling Foley catheter. Electronically Signed   By: Julian Hy M.D.   On: 07/06/2019 04:36   DG Chest Port 1 View  Result Date: 07/05/2019 CLINICAL DATA:  Fever EXAM: PORTABLE CHEST 1 VIEW COMPARISON:  08/08/2018 FINDINGS: No focal airspace disease or effusion. Borderline cardiomegaly. Aortic atherosclerosis. No pneumothorax. IMPRESSION: No active disease. Electronically Signed   By: Donavan Foil M.D.   On: 07/05/2019 23:45     Labs:   Basic Metabolic Panel: Recent Labs  Lab 07/05/19 2346 07/05/19 2346 07/06/19 0610 07/06/19 0610 07/07/19 0541 07/08/19 0528 07/09/19 0500 07/10/19 0429  NA 144  --  141  --   --   --  143 144  K 4.0   < > 4.2   < >  --   --  3.4* 3.5  CL 113*  --  111  --   --   --  113* 112*  CO2 22  --  20*  --   --   --  23 23  GLUCOSE 157*  --  111*  --   --   --  95 93  BUN 41*  --  35*  --   --   --  29* 32*  CREATININE 2.56*   < > 2.24*  --  1.88* 1.63* 1.60* 1.71*  CALCIUM 9.2  --  8.3*  --   --   --  8.3* 8.5*   < > = values in this interval not displayed.   GFR Estimated Creatinine Clearance: 40.3  mL/min (A) (by C-G formula based on SCr of 1.71 mg/dL (H)). Liver Function Tests: Recent Labs  Lab 07/05/19 2346  AST 28  ALT 21  ALKPHOS 60  BILITOT 1.4*  PROT 7.1  ALBUMIN 4.0   No results for input(s): LIPASE, AMYLASE in the last 168 hours. No results for input(s): AMMONIA in the last 168 hours. Coagulation profile Recent Labs  Lab 07/06/19 0610 07/07/19 0541 07/08/19 0528 07/09/19 0500  07/10/19 0429  INR 2.2* 2.1* 1.8* 1.9* 2.0*    CBC: Recent Labs  Lab 07/06/19 0610 07/07/19 0541 07/08/19 0528 07/09/19 0500 07/10/19 0429  WBC 13.5* 13.6* 10.6* 7.6 6.2  NEUTROABS  --   --   --  5.2 3.8  HGB 11.7* 10.9* 10.3* 10.3* 9.7*  HCT 34.6* 31.4* 30.5* 30.3* 28.9*  MCV 100.0 98.7 100.3* 100.3* 100.7*  PLT 96* 94* 97* 116* 122*   Cardiac Enzymes: No results for input(s): CKTOTAL, CKMB, CKMBINDEX, TROPONINI in the last 168 hours. BNP: Invalid input(s): POCBNP CBG: No results for input(s): GLUCAP in the last 168 hours. D-Dimer No results for input(s): DDIMER in the last 72 hours. Hgb A1c No results for input(s): HGBA1C in the last 72 hours. Lipid Profile No results for input(s): CHOL, HDL, LDLCALC, TRIG, CHOLHDL, LDLDIRECT in the last 72 hours. Thyroid function studies No results for input(s): TSH, T4TOTAL, T3FREE, THYROIDAB in the last 72 hours.  Invalid input(s): FREET3 Anemia work up No results for input(s): VITAMINB12, FOLATE, FERRITIN, TIBC, IRON, RETICCTPCT in the last 72 hours. Microbiology Recent Results (from the past 240 hour(s))  Culture, blood (Routine X 2) w Reflex to ID Panel     Status: Abnormal   Collection Time: 07/05/19 11:46 PM   Specimen: BLOOD  Result Value Ref Range Status   Specimen Description   Final    BLOOD RIGHT ANTECUBITAL Performed at [facility], [place] [address] [place], [place] [address]    Special Requests   Final    BOTTLES DRAWN AEROBIC AND ANAEROBIC Blood Culture adequate volume Performed at [facility] [address] [place], [place] [address]    Culture  Setup Time   Final    GRAM POSITIVE COCCI IN CLUSTERS AEROBIC BOTTLE ONLY CRITICAL RESULT CALLED TO, READ BACK BY AND VERIFIED WITH: M SWAYNE,PHARMD AT 2050 07/06/19 BY L BENFIELD    Culture (A)  Final    STAPHYLOCOCCUS SPECIES (COAGULASE NEGATIVE) THE SIGNIFICANCE OF ISOLATING THIS ORGANISM FROM A SINGLE SET OF BLOOD CULTURES WHEN MULTIPLE SETS ARE DRAWN IS UNCERTAIN. PLEASE NOTIFY THE MICROBIOLOGY DEPARTMENT WITHIN ONE WEEK IF SPECIATION AND SENSITIVITIES ARE REQUIRED. Performed at [facility], [place] [address], [place], [place] [address]    Report Status 07/08/2019 FINAL  Final  Culture, blood (Routine X 2) w Reflex to ID Panel     Status: None (Preliminary result)   Collection Time: 07/05/19 11:46 PM   Specimen: BLOOD RIGHT HAND  Result Value Ref Range Status   Specimen Description   Final    BLOOD RIGHT HAND Performed at [facility] [address] [place], [place] [address]    Special Requests   Final    BOTTLES DRAWN AEROBIC AND ANAEROBIC Blood Culture results may not be optimal due to an inadequate volume of blood received in culture bottles Performed at [facility] [address] [place], [place] [address]    Culture   Final    NO GROWTH 4 DAYS Performed at [facility], [place] [address] [place], [place] [address]    Report Status PENDING  Incomplete  Culture, Urine     Status: Abnormal   Collection Time: 07/06/19  2:07 AM   Specimen: Urine, Random  Result Value Ref Range Status   Specimen Description   Final    URINE, RANDOM Performed at [facility] [address] [place], [place] [address]    Special Requests  Final    NONE Performed at [facility], [place] [address] [place], [place] [address]    Culture 80,000 COLONIES/mL ENTEROCOCCUS FAECALIS (A)  Final   Report Status 07/08/2019 FINAL  Final   Organism  ID, Bacteria ENTEROCOCCUS FAECALIS (A)  Final      Susceptibility   Enterococcus faecalis - MIC*    AMPICILLIN <=2 SENSITIVE Sensitive     NITROFURANTOIN <=16 SENSITIVE Sensitive     VANCOMYCIN 1 SENSITIVE Sensitive     * 80,000 COLONIES/mL ENTEROCOCCUS FAECALIS  SARS CORONAVIRUS 2 (TAT 6-24 HRS) Nasopharyngeal Nasopharyngeal Swab     Status: None   Collection Time: 07/06/19  2:19 AM   Specimen: Nasopharyngeal Swab  Result Value Ref Range Status   SARS Coronavirus 2 NEGATIVE NEGATIVE Final    Comment: (NOTE) SARS-CoV-2 target nucleic acids are NOT DETECTED. The SARS-CoV-2 RNA is generally detectable in upper and lower respiratory specimens during the acute phase of infection. Negative results do not preclude SARS-CoV-2 infection, do not rule out co-infections with other pathogens, and should not be used as the sole basis for treatment or other patient management decisions. Negative results must be combined with clinical observations, patient history, and epidemiological information. The expected result is Negative. Fact Sheet for Patients: SugarRoll.be Fact Sheet for Healthcare Providers: https://www.woods-mathews.com/ This test is not yet approved or cleared by the [place] FDA and  has been authorized for detection and/or diagnosis of SARS-CoV-2 by FDA under an Emergency Use Authorization (EUA). This EUA will remain  in effect (meaning this test can be used) for the duration of the COVID-19 declaration under Section 56 4(b)(1) of the Act, 21 U.S.C. section 360bbb-3(b)(1), unless the authorization is terminated or revoked sooner. Performed at [facility], [place] [address], [place], [place] [address]   Culture, blood (routine x 2)     Status: None (Preliminary result)   Collection Time: 07/08/19  8:52 AM   Specimen: BLOOD RIGHT HAND  Result Value Ref Range Status   Specimen Description   Final    BLOOD RIGHT HAND Performed at  [facility] [address] [place], [place] [address]    Special Requests   Final    BOTTLES DRAWN AEROBIC AND ANAEROBIC Blood Culture adequate volume Performed at [place] [address] [place], [place] [address]    Culture   Final    NO GROWTH 2 DAYS Performed at [facility] [address], [place], [place] [address]    Report Status PENDING  Incomplete  Culture, blood (routine x 2)     Status: None (Preliminary result)   Collection Time: 07/08/19  8:52 AM   Specimen: BLOOD LEFT HAND  Result Value Ref Range Status   Specimen Description   Final    BLOOD LEFT HAND Performed at [facility] [address] [place], [place] [address]    Special Requests   Final    BOTTLES DRAWN AEROBIC AND ANAEROBIC Blood Culture adequate volume Performed at [facility] [address], [place], [place] [address]    Culture   Final    NO GROWTH 2 DAYS Performed at [facility] [address] [place], [place] [address]    Report Status PENDING  Incomplete    Please note: You were cared for by a hospitalist during your hospital stay. Once you are discharged, your primary care physician will handle any further medical issues. Please note that  NO REFILLS for any discharge medications will be authorized once you are discharged, as it is imperative that you return to your primary care physician (or establish a relationship with a primary care physician if you do not have one) for your post hospital discharge needs so that they can reassess your need for medications and monitor your lab values.  Signed: Terrilee Croak  [facility] 07/10/2019, 5:23 PM

## 2019-07-10 NOTE — Progress Notes (Signed)
MD contacted in regards to patient having difficulty with self catheterization. Bladder scan done with result of greater than 1071mL. Urology to come to room to place foley.

## 2019-07-10 NOTE — Discharge Instructions (Signed)

## 2019-07-10 NOTE — Progress Notes (Signed)
[facility] for [place]  Indication: hx of PE, chronic DVT, hx of mild stroke  Allergies  Allergen Reactions  . Gluten Meal Other (See Comments)    Gluten Sensitivity (reaction undefined) NO BREAD  . Wheat Bran Other (See Comments)    Reaction undefined    Height: 6' (182.9 cm) Weight: 189 lb 9.5 oz (86 kg) IBW/kg (Calculated) : 77.6   Vital Signs: Temp: 98.3 F (36.8 C) (01/27 0506) Temp Source: Oral (01/27 0506) BP: 115/70 (01/27 0506) Pulse Rate: 63 (01/27 0506)  Labs: Recent Labs    07/08/19 0528 07/08/19 0528 07/09/19 0500 07/10/19 0429  HGB 10.3*   < > 10.3* 9.7*  HCT 30.5*  --  30.3* 28.9*  PLT 97*  --  116* 122*  LABPROT 21.1*  --  21.4* 22.7*  INR 1.8*  --  1.9* 2.0*  CREATININE 1.63*  --  1.60* 1.71*   < > = values in this interval not displayed.    Estimated Creatinine Clearance: 40.3 mL/min (A) (by C-G formula based on SCr of 1.71 mg/dL (H)).   Medical History: Past Medical History:  Diagnosis Date  . Asthma    hx of as child  . Bronchitis    as a child  . Cataract   . Chronic anticoagulation 06/11/2013  . Clotting disorder ([place])   . 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 Elevated PSA    being monitored by physician  . Glaucoma 10/13/2011  . 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 Headache disorder 02/24/2015   occ  . Hypertension    sees [name], primary   . MI (myocardial infarction) ([place]) not sure when   scar tissue saw  . Peyronie's disease   . 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 Pituitary tumor    takes medication to manage  . Prostatitis   . Pulmonary embolism ([place]) yrs ago  . Rash    last 3 months chest and arms, saw md no tx given  . 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 Stroke [facility])  2017   mild cva    Medications: PTA Warfarin dose per med list: 5 mg Sat/Sun, 2.5 mg M/W/Thur and none on Tues. Last dose PTA 1/21  Assessment: 76 yoM on warfarin PTA for hx of PE/DVT and mild stroke who presented to [facility] with confusion, fever. Patient has foley catheter in place. Patient currently being treated for catheter-associated UTI. Pharmacy consulted to dose warfarin while patient in the hospital. Per notes, foley catheter draining red tinged urine on admission.    Today, 07/10/19:  INR = 2.0, therapeutic  CBC: Hgb 9.7, Pltc low 122   No bleeding issues noted   Goal of Therapy:  INR 2-3   Plan:  Warfarin 5mg  PO x 1  Daily PT/INR Monitor CBC Monitor closely for hematuria and other s/sx of bleeding   Dolly Rias RPh 07/10/2019, 8:17 AM

## 2019-07-11 LAB — CULTURE, BLOOD (ROUTINE X 2): Culture: NO GROWTH

## 2019-07-13 LAB — CULTURE, BLOOD (ROUTINE X 2)
Culture: NO GROWTH
Culture: NO GROWTH
Special Requests: ADEQUATE
Special Requests: ADEQUATE

## 2019-07-21 ENCOUNTER — Ambulatory Visit: Payer: Medicare Other

## 2019-07-23 DIAGNOSIS — Z7901 Long term (current) use of anticoagulants: Secondary | ICD-10-CM | POA: Diagnosis not present

## 2019-07-29 DIAGNOSIS — R338 Other retention of urine: Secondary | ICD-10-CM | POA: Diagnosis not present

## 2019-07-29 DIAGNOSIS — C61 Malignant neoplasm of prostate: Secondary | ICD-10-CM | POA: Diagnosis not present

## 2019-08-02 ENCOUNTER — Encounter (HOSPITAL_COMMUNITY): Payer: Self-pay

## 2019-08-02 ENCOUNTER — Other Ambulatory Visit: Payer: Self-pay

## 2019-08-02 ENCOUNTER — Emergency Department (HOSPITAL_COMMUNITY)
Admission: EM | Admit: 2019-08-02 | Discharge: 2019-08-02 | Disposition: A | Discharge status: Home / Self Care | Payer: Medicare Other | Attending: Emergency Medicine | Admitting: Emergency Medicine

## 2019-08-02 ENCOUNTER — Emergency Department (HOSPITAL_COMMUNITY): Payer: Medicare Other

## 2019-08-02 DIAGNOSIS — Z7901 Long term (current) use of anticoagulants: Secondary | ICD-10-CM | POA: Insufficient documentation

## 2019-08-02 DIAGNOSIS — Z79899 Other long term (current) drug therapy: Secondary | ICD-10-CM | POA: Diagnosis not present

## 2019-08-02 DIAGNOSIS — I10 Essential (primary) hypertension: Secondary | ICD-10-CM | POA: Diagnosis not present

## 2019-08-02 DIAGNOSIS — Z87891 Personal history of nicotine dependence: Secondary | ICD-10-CM | POA: Insufficient documentation

## 2019-08-02 DIAGNOSIS — R42 Dizziness and giddiness: Secondary | ICD-10-CM | POA: Insufficient documentation

## 2019-08-02 DIAGNOSIS — R2681 Unsteadiness on feet: Secondary | ICD-10-CM | POA: Diagnosis not present

## 2019-08-02 DIAGNOSIS — J45909 Unspecified asthma, uncomplicated: Secondary | ICD-10-CM | POA: Diagnosis not present

## 2019-08-02 DIAGNOSIS — D049 Carcinoma in situ of skin, unspecified: Secondary | ICD-10-CM | POA: Diagnosis not present

## 2019-08-02 LAB — BASIC METABOLIC PANEL
Anion gap: 8 (ref 5–15)
BUN: 24 mg/dL — ABNORMAL HIGH (ref 8–23)
CO2: 26 mmol/L (ref 22–32)
Calcium: 9.3 mg/dL (ref 8.9–10.3)
Chloride: 109 mmol/L (ref 98–111)
Creatinine, Ser: 1.71 mg/dL — ABNORMAL HIGH (ref 0.61–1.24)
GFR calc Af Amer: 44 mL/min — ABNORMAL LOW (ref >60)
GFR calc non Af Amer: 38 mL/min — ABNORMAL LOW (ref >60)
Glucose, Bld: 117 mg/dL — ABNORMAL HIGH (ref 70–99)
Potassium: 3.9 mmol/L (ref 3.5–5.1)
Sodium: 143 mmol/L (ref 135–145)

## 2019-08-02 LAB — URINALYSIS, ROUTINE W REFLEX MICROSCOPIC
Bilirubin Urine: NEGATIVE
Glucose, UA: NEGATIVE mg/dL
Hgb urine dipstick: NEGATIVE
Ketones, ur: NEGATIVE mg/dL
Nitrite: NEGATIVE
Protein, ur: 30 mg/dL — AB
Specific Gravity, Urine: 1.011 (ref 1.005–1.030)
pH: 6 (ref 5.0–8.0)

## 2019-08-02 LAB — CBC
HCT: 38.8 % — ABNORMAL LOW (ref 39.0–52.0)
Hemoglobin: 13.1 g/dL (ref 13.0–17.0)
MCH: 34.2 pg — ABNORMAL HIGH (ref 26.0–34.0)
MCHC: 33.8 g/dL (ref 30.0–36.0)
MCV: 101.3 fL — ABNORMAL HIGH (ref 80.0–100.0)
Platelets: 152 10*3/uL (ref 150–400)
RBC: 3.83 MIL/uL — ABNORMAL LOW (ref 4.22–5.81)
RDW: 12.2 % (ref 11.5–15.5)
WBC: 5.9 10*3/uL (ref 4.0–10.5)
nRBC: 0 % (ref 0.0–0.2)

## 2019-08-02 LAB — PROTIME-INR
INR: 1.8 — ABNORMAL HIGH (ref 0.8–1.2)
Prothrombin Time: 20.7 seconds — ABNORMAL HIGH (ref 11.4–15.2)

## 2019-08-02 NOTE — Discharge Instructions (Addendum)
Follow-up with your neurologist for the vertigo.  He also had a urine culture sent to be notified if it shows infection.

## 2019-08-02 NOTE — ED Notes (Signed)
Pt transported to MRI 

## 2019-08-02 NOTE — ED Notes (Signed)
Pt d/c home per MD order. Discharge summary reviewed, pt verbalizes understanding. Voicng no complaints at discharge,.

## 2019-08-02 NOTE — ED Triage Notes (Signed)
Pt reports loss of balance for the past week, hx of stroke about 5 years ago with similar symptoms. Pt denies any other symptoms. Pt ambulatory with steady gait upon arrival to ED. Denies headache or dizziness at this time.

## 2019-08-02 NOTE — ED Provider Notes (Signed)
[facility] EMERGENCY DEPARTMENT Provider Note   CSN: [id number] Arrival date & time: 08/02/19  [id number]     History No chief complaint on file.   Jay Day is a 79 y.o. male.  HPI Patient presents with dizziness.  Has been feeling unsteady for around the last week.  States initially can run some but is been more severe and more constant over the last couple days.  Around 5 years ago had a stroke that had similar symptoms.  Normally does not feel dizzy over.  States he was recently in the hospital with urinary tract infection and had some prostate issues.  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  Has had previous stroke however.  No lightheadedness no dizziness.  States he does get orthostatic due to his blood pressure medicines at time.    Past Medical History:  Diagnosis Date  . Asthma    hx of as child  . Bronchitis    as a child  . Cataract   . Chronic anticoagulation 06/11/2013  . Clotting disorder ([place])   . 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 Elevated PSA    being monitored by physician  . Glaucoma 10/13/2011  . 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 Headache disorder 02/24/2015   occ  . Hypertension    sees [name], primary   . MI (myocardial infarction) ([place]) not sure when   scar tissue saw  . Peyronie's disease   . 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 Pituitary tumor    takes medication to manage  . Prostatitis   . Pulmonary embolism ([place]) yrs ago  . Rash    last 3 months chest and arms, saw md no tx given  . 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 Stroke [facility]) 2017   mild cva    Patient Active Problem List   Diagnosis Date Noted  . Acute encephalopathy 07/06/2019  . Catheter-associated  urinary tract infection ([place]) 07/06/2019  . AKI (acute kidney injury) ([place]) 07/06/2019  . Upper airway cough syndrome 07/11/2018  . DOE (dyspnea on exertion) 07/10/2018  . Community acquired pneumonia 07/02/2018  . CAP (community acquired pneumonia) 07/02/2018  . Lacunar infarct, acute ([place]) 11/16/2015  . Elevated PSA 09/14/2015  . Cerebral infarction due to unspecified mechanism   . CVA (cerebral infarction) 09/13/2015  . 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 Abnormal stress test 05/06/2014  . Aortic atherosclerosis ([place]) 03/17/2014  . Coronary artery calcification 03/17/2014  . History of pulmonary embolism 03/17/2014  . Chronic anticoagulation 06/11/2013  . 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 Glaucoma 10/13/2011  . 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 Essential hypertension 09/11/2011  . Pulmonary embolism ([place]) 09/16/2010    Past Surgical History:  Procedure Laterality Date  . COLONOSCOPY N/A 08/22/2016   Procedure: COLONOSCOPY;  Surgeon: Garlan Fair, MD;  Location: [facility] ENDOSCOPY;  Service: Endoscopy;  Laterality: N/A;  . colonscopy  2007  . EYE SURGERY     bilateral cataract surgery  . HERNIA REPAIR  1960  . INGUINAL HERNIA REPAIR  08/12/2011   Procedure: LAPAROSCOPIC INGUINAL HERNIA;  Surgeon: [name], MD;  Location: [facility] OR;  Service: General;  Laterality: Left;  . [place]   left   . LEFT HEART CATHETERIZATION WITH CORONARY ANGIOGRAM N/A 05/06/2014   Procedure: LEFT HEART CATHETERIZATION WITH CORONARY ANGIOGRAM;  Surgeon: [name], MD;  Location: [facility] CATH LAB;  Service: Cardiovascular;  Laterality: N/A;  . TONSILLECTOMY     as a child       Family History  Problem Relation Age of Onset  . Pulmonary embolism Mother   . Kidney failure Mother   . Hypertension Mother   . Kidney Stones Mother   . Pulmonary embolism Father   .  Prostate cancer Father   . Pulmonary embolism Maternal Grandmother   . Pulmonary embolism Maternal Grandfather   . Pulmonary embolism Paternal Grandmother   . Pulmonary embolism Paternal Grandfather     Social History   Tobacco Use  . Smoking status: Former Smoker    Packs/day: 0.30    Years: 5.00    Pack years: 1.50    Types: Cigarettes    Quit date: 06/13/1970    Years since quitting: 49.1  . Smokeless tobacco: Never Used  Substance Use Topics  . Alcohol use: No    Alcohol/week: 0.0 standard drinks  . Drug use: No    Home Medications Prior to Admission medications   Medication Sig Start Date End Date Taking? Authorizing Provider  bromocriptine (PARLODEL) 2.5 MG tablet TAKE 1/2 TABLET BY MOUTH 5 TIMES A WEEK Patient taking differently: Take 1.25 mg by mouth See admin instructions. TAKE 1/2 TABLET BY MOUTH 5 TIMES A WEEK. M-F 04/29/19  Yes Elayne Snare, MD  dorzolamide-timolol (COSOPT) 22.3-6.8 MG/ML ophthalmic solution Place 1 drop into both eyes 2 (two) times daily.  06/12/19  Yes [provider]  latanoprost (XALATAN) 0.005 % ophthalmic solution Place 1 drop into both eyes at bedtime.  03/30/15  Yes [provider]  metoprolol succinate (TOPROL-XL) 25 MG 24 hr tablet Take 50 mg by mouth daily.    Yes [provider]  Multiple Vitamin (MULTIVITAMIN WITH MINERALS) TABS tablet Take 1 tablet by mouth daily.   Yes [provider]  Probiotic Product (PROBIOTIC ADVANCED PO) Take 1 capsule by mouth daily.   Yes [provider]  warfarin (COUMADIN) 5 MG tablet Start from tomorrow Patient taking differently: Take 2.5-5 mg by mouth See admin instructions. 5mg  on SAT and SUN, 2.5mg  on all other days 07/04/18  Yes Adhikari, Tamsen Meek, MD  Zinc 22.5 MG TABS Take 22.5 mg by mouth daily.   Yes [provider]    Allergies    Gluten meal and Wheat bran  Review of Systems   Review of Systems  Constitutional: Negative for appetite change.   HENT: Negative for dental problem.   Respiratory: Negative for choking.   Genitourinary: Negative for flank pain.  Musculoskeletal: Negative for back pain.  Skin: Negative for rash.  Neurological: Positive for dizziness. Negative for facial asymmetry and light-headedness.  Psychiatric/Behavioral: Negative for confusion.    Physical Exam Updated Vital Signs BP 139/71   Pulse (!) 55   Temp 97.6 F (36.4 C) (Oral)   Resp 16   SpO2 97%   Physical Exam Vitals and nursing note reviewed.  HENT:     Head: Atraumatic.  Eyes:     Extraocular Movements: Extraocular movements intact.     Pupils: Pupils are equal, round, and reactive to light.  Cardiovascular:     Rate and Rhythm: Regular rhythm.  Pulmonary:  Breath sounds: No wheezing or rhonchi.  Abdominal:     Tenderness: There is no abdominal tenderness.  Musculoskeletal:        General: No tenderness.     Cervical back: Neck supple.  Skin:    General: Skin is warm.  Neurological:     Mental Status: He is alert.     Comments: Face symmetric.  Eye movements intact.  Good grip strength bilaterally.  Finger-nose intact bilaterally.  Heel shin intact bilaterally.  Does have nystagmus with end gaze to left and right however.     ED Results / Procedures / Treatments   Labs (all labs ordered are listed, but only abnormal results are displayed) Labs Reviewed  CBC - Abnormal; Notable for the following components:      Result Value   RBC 3.83 (*)    HCT 38.8 (*)    MCV 101.3 (*)    MCH 34.2 (*)    All other components within normal limits  BASIC METABOLIC PANEL - Abnormal; Notable for the following components:   Glucose, Bld 117 (*)    BUN 24 (*)    Creatinine, Ser 1.71 (*)    GFR calc non Af Amer 38 (*)    GFR calc Af Amer 44 (*)    All other components within normal limits  URINALYSIS, ROUTINE W REFLEX MICROSCOPIC - Abnormal; Notable for the following components:   APPearance HAZY (*)    Protein, ur 30 (*)     Leukocytes,Ua MODERATE (*)    Bacteria, UA FEW (*)    All other components within normal limits  PROTIME-INR - Abnormal; Notable for the following components:   Prothrombin Time 20.7 (*)    INR 1.8 (*)    All other components within normal limits  URINE CULTURE    EKG None  Radiology MR BRAIN WO CONTRAST  Result Date: 08/02/2019 CLINICAL DATA:  Vertigo, central. EXAM: MRI HEAD WITHOUT CONTRAST TECHNIQUE: Multiplanar, multiecho pulse sequences of the brain and surrounding structures were obtained without intravenous contrast. COMPARISON:  Head without contrast 06/18/2016. FINDINGS: Brain: Remote lacunar infarct of the right corona radiata and posterior right periventricular cyst are stable. White matter changes are otherwise within normal limits for age. The ventricles are of normal size. No significant extraaxial fluid collection is present. Cystic area along the right side of the sella and cavernous sinus is stable. Vascular: No significant extraaxial fluid collection is present. Skull and upper cervical spine: The craniocervical junction is normal. Upper cervical spine is within normal limits. Marrow signal is unremarkable. Sinuses/Orbits: Diffuse mucosal thickening is present throughout the paranasal sinuses. Scattered opacification present within ethmoid air cells. Small amount of fluid is present in mastoid air cells. No obstructing nasopharyngeal lesion is present. IMPRESSION: 1. No acute intracranial abnormality or significant interval change. 2. Remote lacunar infarct of the left corona radiata and cyst the posterior right corona radiata are stable. 3. Mild diffuse sinus disease. Electronically Signed   By: San Morelle M.D.   On: 08/02/2019 11:29    Procedures Procedures (including critical care time)  Medications Ordered in ED Medications - No data to display  ED Course  I have reviewed the triage vital signs and the nursing notes.  Pertinent labs & imaging results that  were available during my care of the patient were reviewed by me and considered in my medical decision making (see chart for details).    MDM Rules/Calculators/A&P  Patient with dizziness.  History of same with stroke.  However MRI reassuring.  Urinalysis not clear infection but culture sent.  Discharge home with neurology follow-up. Final Clinical Impression(s) / ED Diagnoses Final diagnoses:  Unsteadiness    Rx / DC Orders ED Discharge Orders    None       Davonna Belling, MD 08/02/19 1605

## 2019-08-04 LAB — URINE CULTURE: Culture: >=100000 — AB

## 2019-08-05 ENCOUNTER — Telehealth: Payer: Self-pay | Admitting: *Deleted

## 2019-08-05 DIAGNOSIS — Z8673 Personal history of transient ischemic attack (TIA), and cerebral infarction without residual deficits: Secondary | ICD-10-CM | POA: Diagnosis not present

## 2019-08-05 DIAGNOSIS — N1831 Chronic kidney disease, stage 3a: Secondary | ICD-10-CM | POA: Diagnosis not present

## 2019-08-05 DIAGNOSIS — Z7901 Long term (current) use of anticoagulants: Secondary | ICD-10-CM | POA: Diagnosis not present

## 2019-08-05 DIAGNOSIS — C61 Malignant neoplasm of prostate: Secondary | ICD-10-CM | POA: Diagnosis not present

## 2019-08-05 DIAGNOSIS — N289 Disorder of kidney and ureter, unspecified: Secondary | ICD-10-CM | POA: Diagnosis not present

## 2019-08-05 DIAGNOSIS — I10 Essential (primary) hypertension: Secondary | ICD-10-CM | POA: Diagnosis not present

## 2019-08-05 NOTE — Telephone Encounter (Signed)
Post ED Visit - Positive Culture Follow-up  Culture report reviewed by antimicrobial stewardship pharmacist: [place] Team []  Elenor Quinones, Pharm.D. []  Heide Guile, Pharm.D., BCPS AQ-ID []  Parks Neptune, Pharm.D., BCPS []  Alycia Rossetti, Pharm.D., BCPS []  [place], Pharm.D., BCPS, AAHIVP []  Legrand Como, Pharm.D., BCPS, AAHIVP []  Salome Arnt, PharmD, BCPS []  Johnnette Gourd, PharmD, BCPS []  Hughes Better, PharmD, BCPS []  Leeroy Cha, PharmD []  Laqueta Linden, PharmD, BCPS []  Albertina Parr, PharmD Cristela Felt, [place] Team []  Leodis Sias, PharmD []  Lindell Spar, PharmD []  Royetta Asal, PharmD []  Graylin Shiver, Rph []  Rema Fendt) Glennon Mac, PharmD []  Arlyn Dunning, PharmD []  Netta Cedars, PharmD []  Dia Sitter, PharmD []  Leone Haven, PharmD []  Gretta Arab, PharmD []  Theodis Shove, PharmD []  Peggyann Juba, PharmD []  Reuel Boom, PharmD   Positive urine culture Treated with Ampicillin and Augmentin at recent hospitalization and no further patient follow-up is required at this time.  Harlon Flor [facility] 08/05/2019, 9:31 AM

## 2019-08-07 ENCOUNTER — Telehealth: Payer: Self-pay | Admitting: Cardiology

## 2019-08-07 ENCOUNTER — Other Ambulatory Visit: Payer: Self-pay | Admitting: Urology

## 2019-08-07 ENCOUNTER — Telehealth: Payer: Self-pay | Admitting: *Deleted

## 2019-08-07 DIAGNOSIS — Z7901 Long term (current) use of anticoagulants: Secondary | ICD-10-CM | POA: Diagnosis not present

## 2019-08-07 NOTE — Telephone Encounter (Signed)
   [facility] Pre-operative Risk Assessment    Request for surgical clearance:  1. What type of surgery is being performed?  ROBOTIC PROSTATESTERTOMY AND BILATERAL PELVIC LYMPHADENECTOMY    2. When is this surgery scheduled?  09/06/2019   3. What type of clearance is required (medical clearance vs. Pharmacy clearance to hold med vs. Both)? BOTH  4. Are there any medications that need to be held prior to surgery and how long? COUMADIN X'S 5 DAYS   5. Practice name and name of physician performing surgery?  [facility] / DR. MANNING   6. What is your office phone number [phone]      7.   What is your office fax number [phone]  8.   Anesthesia type (None, local, MAC, general) ?  LM FOR SURGERY SCHEDULER TO CALL BACK WITH THAT INFORMATION    Jeanann Lewandowsky 08/07/2019, 2:24 PM  _________________________________________________________________   (provider comments below)

## 2019-08-07 NOTE — Telephone Encounter (Signed)
   Primary Cardiologist:Mark Marlou Porch, MD  Chart reviewed as part of pre-operative protocol coverage. Because of Jay Day's past medical history and time since last visit, he/she will require a follow-up visit in order to better assess preoperative cardiovascular risk.  Pre-op covering staff: - Please schedule appointment and call patient to inform them. - Please contact requesting surgeon's office via preferred method (i.e, phone, fax) to inform them of need for appointment prior to surgery.  If applicable, this message will also be routed to pharmacy pool and/or primary cardiologist for input on holding anticoagulant/antiplatelet agent as requested below so that this information is available at time of patient's appointment.   Cecilie Kicks, NP  08/07/2019, 2:34 PM

## 2019-08-07 NOTE — Telephone Encounter (Signed)
I spoke to the patient and told him to have his surgical office fax a clearance form to our office.  Once received, we would go from there.  He verbalized understanding.

## 2019-08-07 NOTE — Telephone Encounter (Signed)
Patient states that he is requesting clearance to hold warfarin (COUMADIN) 5 MG tablet medication and switch to [facility] prior to having surgery. Patient states he was advised by his PCP to alter his BP medication. I informed the patient that the office performing the surgery will have to contact the office to request surgical clearance. Patient verbalized understanding. Please call to confirm.

## 2019-08-07 NOTE — Telephone Encounter (Signed)
1st attempt to reach pt re: needing an appt for surgical clearance.  Left a detailed message for pt to call back and schedule an appt.

## 2019-08-08 NOTE — Telephone Encounter (Signed)
Pt is aware of needing pre op appt. Pt has been scheduled to see Everitt Nara, PA 08/13/19 @ 2:45, same day Dr. Marlou Porch is in the office. Pt also asked if Dr.Skains was ok with him wanting to change over from Coumadin to Eliquis. I stated I saw the call from yesterday from Frederik Schmidt, RN, though I do not see that the call was sent to Dr. Marlou Porch or his nurse. I assured the pt that I will send yesterday phone note to Dr. Marlou Porch and his nurse Rexene Agent. RN. I advised the pt to also to make sure to d/w Richardson Dopp, [facility] Tuesday 08/13/19 at appt. Pt thanked me for the call. I will forward clearance notes to PA for upcoming appt. FYI sent to Dr. Tammi Klippel with [facility]. I will remove from the pre op call back pool.

## 2019-08-08 NOTE — Telephone Encounter (Signed)
Pt is aware of needing pre op appt. Pt has been scheduled to see Nelson Frederique, PA 08/13/19 @ 2:45, same day Dr. Marlou Porch is in the office. Pt also asked if Dr.Skains was ok with him wanting to change over from Coumadin to Eliquis. I stated I saw the call from yesterday from Frederik Schmidt, RN, though I do not see that the call was sent to Dr. Marlou Porch or his nurse. I assured the pt that I will send yesterday phone note to Dr. Marlou Porch and his nurse Rexene Agent. RN. I advised the pt to also to make sure to d/w Richardson Dopp, [facility] Tuesday 08/13/19 at appt. Pt thanked me for the call. I will forward clearance notes to PA for upcoming appt. FYI sent to Dr. Tammi Klippel with [facility]. I will remove from the pre op call back pool.   I have sent phone note from yesterday from Frederik Schmidt, RN to Dr. Marlou Porch and his nurse Rexene Agent RN.

## 2019-08-09 DIAGNOSIS — I2510 Atherosclerotic heart disease of native coronary artery without angina pectoris: Secondary | ICD-10-CM | POA: Diagnosis not present

## 2019-08-09 DIAGNOSIS — C61 Malignant neoplasm of prostate: Secondary | ICD-10-CM | POA: Diagnosis not present

## 2019-08-09 DIAGNOSIS — I10 Essential (primary) hypertension: Secondary | ICD-10-CM | POA: Diagnosis not present

## 2019-08-11 ENCOUNTER — Ambulatory Visit: Payer: Medicare Other

## 2019-08-12 DIAGNOSIS — N179 Acute kidney failure, unspecified: Secondary | ICD-10-CM | POA: Diagnosis not present

## 2019-08-12 NOTE — Telephone Encounter (Signed)
Preoperative clearance scheduled for tomorrow with Richardson Dopp PA-C, will remove from preop pool.

## 2019-08-13 ENCOUNTER — Encounter: Payer: Self-pay | Admitting: Physician Assistant

## 2019-08-13 ENCOUNTER — Other Ambulatory Visit: Payer: Self-pay

## 2019-08-13 ENCOUNTER — Ambulatory Visit (INDEPENDENT_AMBULATORY_CARE_PROVIDER_SITE_OTHER): Payer: Medicare Other | Admitting: Physician Assistant

## 2019-08-13 VITALS — BP 120/80 | HR 53 | Ht 72.0 in | Wt 184.1 lb

## 2019-08-13 DIAGNOSIS — Z01810 Encounter for preprocedural cardiovascular examination: Secondary | ICD-10-CM

## 2019-08-13 DIAGNOSIS — Z86711 Personal history of pulmonary embolism: Secondary | ICD-10-CM | POA: Diagnosis not present

## 2019-08-13 DIAGNOSIS — I2510 Atherosclerotic heart disease of native coronary artery without angina pectoris: Secondary | ICD-10-CM

## 2019-08-13 DIAGNOSIS — I428 Other cardiomyopathies: Secondary | ICD-10-CM

## 2019-08-13 DIAGNOSIS — I10 Essential (primary) hypertension: Secondary | ICD-10-CM

## 2019-08-13 NOTE — Progress Notes (Signed)
Cardiology Office Note:    Date:  08/13/2019   ID:  Jay Day, DOB [birth year], MRN [id number]  PCP:  Leeroy Cha, MD  Cardiologist:  [name], MD   Electrophysiologist:  None   Referring MD: Leeroy Cha,*   Chief Complaint:  Surgical Clearance    Patient Profile:    Jay Day is a 79 y.o. 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 was last seen by Dr. Marlou Porch in 07/2018.  As needed follow up with Cardiology was planned at that time.  The patient returns for surgical clearance.  He needs a radical prostatectomy with Dr. Tammi Klippel.    He is here alone today.  He is doing well without chest pain, shortness of breath, syncope, orthopnea.  He has chronic L leg swelling without change.  He did go to the ED several weeks ago with imbalance.  He was concerned he was having another CVA.  MRI was neg for acute CVA.  He has been referred to Neurology.       Past Medical History:  Diagnosis Date  . Asthma    hx of as child    . Bronchitis    as a child  . Cataract   . Chronic anticoagulation 06/11/2013  . Clotting disorder ([place])   . 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 Elevated PSA    being monitored by physician  . Glaucoma 10/13/2011  . 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 Headache disorder 02/24/2015   occ  . Hypertension    sees [name], primary   . MI (myocardial infarction) ([place]) not sure when   scar tissue saw  . Peyronie's disease   . 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 Pituitary tumor    takes medication to manage  . Prostatitis   . Pulmonary embolism ([place]) yrs ago  . Rash    last 3 months chest and arms, saw md no tx given  . 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 Stroke [facility]) 2017   mild cva    Current Medications: Current Meds  Medication Sig  . bromocriptine (PARLODEL) 2.5 MG tablet TAKE 1/2 TABLET BY MOUTH 5 TIMES A WEEK  . dorzolamide-timolol (COSOPT) 22.3-6.8 MG/ML ophthalmic solution Place 1 drop into both eyes 2 (two) times daily.   Marland Kitchen ELIQUIS 5 MG TABS tablet Take 5 mg by mouth 2 (  two) times daily.  Marland Kitchen latanoprost (XALATAN) 0.005 % ophthalmic solution Place 1 drop into both eyes at bedtime.   . metoprolol succinate (TOPROL-XL) 25 MG 24 hr tablet Take 50 mg by mouth daily.   . Multiple Vitamin (MULTIVITAMIN WITH MINERALS) TABS tablet Take 1 tablet by mouth daily.  . Probiotic Product (PROBIOTIC ADVANCED PO) Take 1 capsule by mouth daily.  . Zinc 22.5 MG TABS Take 22.5 mg by mouth daily.     Allergies:   Gluten meal and Wheat bran   Social History   Tobacco Use  . Smoking status: Former Smoker    Packs/day: 0.30    Years: 5.00    Pack years: 1.50    Types: Cigarettes    Quit date: 06/13/1970    Years since quitting: 49.2  . Smokeless tobacco: Never Used   Substance Use Topics  . Alcohol use: No    Alcohol/week: 0.0 standard drinks  . Drug use: No     Family Hx: The patient's family history includes Hypertension in his mother; Kidney Stones in his mother; Kidney failure in his mother; Prostate cancer in his father; Pulmonary embolism in his father, maternal grandfather, maternal grandmother, mother, paternal grandfather, and paternal grandmother.  ROS   EKGs/Labs/Other Test Reviewed:    EKG:  EKG is   ordered today.  The ekg ordered today demonstrates sinus bradycardia, HR 53, normal axis, low voltage, QTC 418, no ST-T wave changes  Recent Labs: 07/05/2019: ALT 21 08/02/2019: BUN 24; Creatinine, Ser 1.71; Hemoglobin 13.1; Platelets 152; Potassium 3.9; Sodium 143   Recent Lipid Panel Lab Results  Component Value Date/Time   CHOL 159 09/14/2015 06:18 AM   TRIG 196 (H) 09/14/2015 06:18 AM   HDL 40 (L) 09/14/2015 06:18 AM   CHOLHDL 4.0 09/14/2015 06:18 AM   LDLCALC 80 09/14/2015 06:18 AM    Physical Exam:    VS:  BP 120/80   Pulse (!) 53   Ht 6' (1.829 m)   Wt 184 lb 1.9 oz (83.5 kg)   SpO2 94%   BMI 24.97 kg/m     Wt Readings from Last 3 Encounters:  08/13/19 184 lb 1.9 oz (83.5 kg)  07/06/19 189 lb 9.5 oz (86 kg)  08/10/18 191 lb 6.4 oz (86.8 kg)     Constitutional:      Appearance: Healthy appearance. Not in distress.  Neck:     Thyroid: Thyroid normal.     Vascular: JVD normal.  Pulmonary:     Effort: Pulmonary effort is normal.     Breath sounds: No wheezing. No rales.  Cardiovascular:     Normal rate. Regular rhythm. Normal S1. Normal S2.     Murmurs: There is no murmur.  Edema:    Pretibial: bilateral trace edema of the pretibial area.    Ankle: bilateral trace edema of the ankle. Abdominal:     Palpations: Abdomen is soft. There is no hepatomegaly.  Skin:    General: Skin is warm and dry.  Neurological:     General: No focal deficit present.     Mental Status: Alert and oriented to person, place and  time.     Cranial Nerves: Cranial nerves are intact.      ASSESSMENT & PLAN:    1. Preoperative cardiovascular examination He does not have any unstable cardiac conditions.  He had mild plaque on cardiac catheterization in 2015.  Myoview prior to that did indicate prior apical infarct.  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  His perioperative risk of major cardiac event is elevated at 6.6% according to the Revised Cardiac Risk Index (RCRI).  However, he has been very active (until the issues with his prostate).  He functional status is good at 6.98 METs according to the Duke Activity Status Index (DASI).  Therefore, according to ACC/AHA guidelines, he does not require further CV testing prior to his procedure and may proceed at acceptable risk.    2. NICM (nonischemic cardiomyopathy) (HCC) EF was 45-50% by left ventriculogram during cardiac catheterization in 2015.  However, since then, his ejection fraction has been normal (echocardiogram 2017; echocardiogram January 2021).  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  This has been present prior echocardiograms as well.  NYHA I.  Volume status stable.  Continue beta-blocker therapy with metoprolol succinate 50 mg daily.  3. Coronary artery disease involving native coronary artery of native heart without angina pectoris Mild LAD plaque by cardiac catheterization November 2015.  He is not having anginal symptoms.  He is not on aspirin as he is on chronic anticoagulation with Apixaban.  4. Essential hypertension The patient's blood pressure is controlled on his current regimen.  Continue current therapy.   5. History of pulmonary embolism He is on chronic anticoagulation.  He was previously on Warfarin.  He is now on Apixaban.  His primary care provider manages his anticoagulation.   Dispo:  Return as needed with Dr. Marlou Porch.   Medication Adjustments/Labs and  Tests Ordered: Current medicines are reviewed at length with the patient today.  Concerns regarding medicines are outlined above.  Tests Ordered: Orders Placed This Encounter  Procedures  . EKG 12-Lead   Medication Changes: No orders of the defined types were placed in this encounter.   Signed, Bilal Manzer, PA-C  08/13/2019 3:52 PM    [facility] [place], [place], [place]  [address] Phone: [phone]; Fax: [phone]

## 2019-08-13 NOTE — Patient Instructions (Signed)
Medication Instructions:  Your physician recommends that you continue on your current medications as directed. Please refer to the Current Medication list given to you today.  *If you need a refill on your cardiac medications before your next appointment, please call your pharmacy*   Lab Work:  None ordered today  Testing/Procedures:  None ordered today  Follow-Up: At [facility], you and your health needs are our priority.  As part of our continuing mission to provide you with exceptional heart care, we have created designated Provider Care Teams.  These Care Teams include your primary Cardiologist (physician) and Advanced Practice Providers (APPs -  Physician Assistants and Nurse Practitioners) who all work together to provide you with the care you need, when you need it.  We recommend signing up for the patient portal called "MyChart".  Sign up information is provided on this After Visit Summary.  MyChart is used to connect with patients for Virtual Visits (Telemedicine).  Patients are able to view lab/test results, encounter notes, upcoming appointments, etc.  Non-urgent messages can be sent to your provider as well.   To learn more about what you can do with MyChart, go to [email or web address].    Your next appointment:    Follow up as needed with [name], MD

## 2019-08-19 ENCOUNTER — Other Ambulatory Visit (INDEPENDENT_AMBULATORY_CARE_PROVIDER_SITE_OTHER): Payer: Medicare Other

## 2019-08-19 ENCOUNTER — Other Ambulatory Visit: Payer: Self-pay

## 2019-08-19 DIAGNOSIS — D352 Benign neoplasm of pituitary gland: Secondary | ICD-10-CM | POA: Diagnosis not present

## 2019-08-20 DIAGNOSIS — C61 Malignant neoplasm of prostate: Secondary | ICD-10-CM | POA: Diagnosis not present

## 2019-08-20 DIAGNOSIS — N1330 Unspecified hydronephrosis: Secondary | ICD-10-CM | POA: Diagnosis not present

## 2019-08-20 LAB — PROLACTIN: Prolactin: 9.5 ng/mL (ref 4.0–15.2)

## 2019-08-21 DIAGNOSIS — N138 Other obstructive and reflux uropathy: Secondary | ICD-10-CM | POA: Diagnosis not present

## 2019-08-21 DIAGNOSIS — Z86711 Personal history of pulmonary embolism: Secondary | ICD-10-CM | POA: Diagnosis not present

## 2019-08-21 DIAGNOSIS — R8290 Unspecified abnormal findings in urine: Secondary | ICD-10-CM | POA: Diagnosis not present

## 2019-08-21 DIAGNOSIS — Z7901 Long term (current) use of anticoagulants: Secondary | ICD-10-CM | POA: Diagnosis not present

## 2019-08-21 DIAGNOSIS — N403 Nodular prostate with lower urinary tract symptoms: Secondary | ICD-10-CM | POA: Diagnosis not present

## 2019-08-22 DIAGNOSIS — C61 Malignant neoplasm of prostate: Secondary | ICD-10-CM | POA: Diagnosis not present

## 2019-08-22 DIAGNOSIS — N139 Obstructive and reflux uropathy, unspecified: Secondary | ICD-10-CM | POA: Diagnosis not present

## 2019-08-22 DIAGNOSIS — M62838 Other muscle spasm: Secondary | ICD-10-CM | POA: Diagnosis not present

## 2019-08-22 DIAGNOSIS — M6281 Muscle weakness (generalized): Secondary | ICD-10-CM | POA: Diagnosis not present

## 2019-08-22 DIAGNOSIS — R338 Other retention of urine: Secondary | ICD-10-CM | POA: Diagnosis not present

## 2019-08-22 DIAGNOSIS — N179 Acute kidney failure, unspecified: Secondary | ICD-10-CM | POA: Diagnosis not present

## 2019-08-22 DIAGNOSIS — Z8546 Personal history of malignant neoplasm of prostate: Secondary | ICD-10-CM | POA: Diagnosis not present

## 2019-08-22 DIAGNOSIS — I129 Hypertensive chronic kidney disease with stage 1 through stage 4 chronic kidney disease, or unspecified chronic kidney disease: Secondary | ICD-10-CM | POA: Diagnosis not present

## 2019-08-22 DIAGNOSIS — M6289 Other specified disorders of muscle: Secondary | ICD-10-CM | POA: Diagnosis not present

## 2019-08-22 DIAGNOSIS — R350 Frequency of micturition: Secondary | ICD-10-CM | POA: Diagnosis not present

## 2019-08-26 ENCOUNTER — Other Ambulatory Visit: Payer: Self-pay

## 2019-08-26 ENCOUNTER — Ambulatory Visit (INDEPENDENT_AMBULATORY_CARE_PROVIDER_SITE_OTHER): Payer: Medicare Other | Admitting: Endocrinology

## 2019-08-26 ENCOUNTER — Encounter: Payer: Self-pay | Admitting: Endocrinology

## 2019-08-26 VITALS — BP 110/70 | HR 63 | Ht 72.0 in | Wt 186.4 lb

## 2019-08-26 DIAGNOSIS — I2510 Atherosclerotic heart disease of native coronary artery without angina pectoris: Secondary | ICD-10-CM | POA: Diagnosis not present

## 2019-08-26 DIAGNOSIS — D352 Benign neoplasm of pituitary gland: Secondary | ICD-10-CM | POA: Diagnosis not present

## 2019-08-26 NOTE — Progress Notes (Signed)
Patient ID: Jay Day, male   DOB: 1940-08-11, 79 y.o.   MRN: [id number]          Chief complaint:  Follow-up of prolactinoma tumor    History of Present Illness     Prior history: Apparently in 2003 he was being evaluated by his PCP for complaints of decreased libido. 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   He was treated by his internist with bromocriptine.  Because of his relatively low prolactin level with 2.5 mg of bromocriptine he was tried on half tablet in 2012 and prolactin was normal at 5.2 subsequently He had been taking 2.5 mg again for some time but his prolactin level in 11/2011 was only 2.0, in 7/14 was 1.9 and in 01/2014 his prolactin level was low at 1.6   Follow-up MRI in 04/2012 did not show any change in size of the tumor   RECENT HISTORY:  .  Patient has been treated long-term with low doses of bromocriptine which he takes at night   No evidence of hypopituitarism on his evaluation in 10/2014 MRI brain scan done for unrelated reasons in 06/2016 showed:   T2 hyperintense focus centered within the right aspect of the pituitary gland and cavernous sinus best appreciated on the coronal sequence is decreased in size from the 2003 pituitary MRI of the brain and probably represents residual adenoma. Description of pituitary not in MRI done in 07/2019  He has been on somewhat variable doses of bromocriptine last few years since prolactin tends to be low normal frequently  When he was not taking bromocriptine his prolactin went up to 44 in 11/20  Since then he had been taking bromocriptine 1.25 mg 5 days a week  He said that he may have had a little better libido since going back on paroxetine but no change in overall energy level No headaches now  Prolactin  now is normal at 9.5 similar to the levels in 01/2019  Testosterone level previously normal  Free T4 has been previously consistently normal  Lab Results  Component Value Date   PROLACTIN 9.5 08/19/2019   PROLACTIN 43.6 (H) 04/24/2019   PROLACTIN 10.4 02/04/2019   PROLACTIN 4.3 08/03/2018   PROLACTIN 10.7 06/25/2018   PROLACTIN 4.4 12/19/2017   PROLACTIN 15.6 (H) 08/18/2017   PROLACTIN 15.9 (H) 02/17/2017    Lab Results  Component Value Date   TSH 1.29 07/10/2018   FREET4 1.04 02/04/2019   FREET4 0.92 12/19/2017   FREET4 0.68 08/18/2017   Lab Results  Component Value Date   TESTOSTERONE 668.75 02/04/2019     Allergies as of 08/26/2019      Reactions   Gluten Meal Other (See Comments)   Gluten Sensitivity (reaction undefined) NO BREAD   Wheat Bran Other (See Comments)   Reaction undefined      Medication List       Accurate as of August 26, 2019  4:16 PM. If you have any questions, ask your nurse or doctor.        bromocriptine 2.5 MG tablet Commonly known as: PARLODEL TAKE 1/2 TABLET BY MOUTH 5 TIMES A WEEK   dorzolamide-timolol 22.3-6.8 MG/ML ophthalmic solution Commonly known as: COSOPT Place 1 drop into both eyes 2 (two) times daily.   Eliquis  5 MG Tabs tablet Generic drug: apixaban Take 5 mg by mouth 2 (two) times daily.   latanoprost 0.005 % ophthalmic solution Commonly known as: XALATAN Place 1 drop into both eyes at bedtime.   metoprolol succinate 25 MG 24 hr tablet Commonly known as: TOPROL-XL Take 50 mg by mouth daily.   multivitamin with minerals Tabs tablet Take 1 tablet by mouth daily.   PROBIOTIC ADVANCED PO Take 1 capsule by mouth daily.   Zinc 22.5 MG Tabs Take 22.5 mg by mouth daily.       Allergies:  Allergies  Allergen Reactions  . Gluten Meal Other (See Comments)    Gluten Sensitivity (reaction undefined) NO BREAD  . Wheat Bran Other (See Comments)    Reaction undefined    Past Medical History:  Diagnosis Date    . Asthma    hx of as child  . Bronchitis    as a child  . Cataract   . Chronic anticoagulation 06/11/2013  . Clotting disorder ([place])   . 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 Elevated PSA    being monitored by physician  . Glaucoma 10/13/2011  . 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 Headache disorder 02/24/2015   occ  . Hypertension    sees [name], primary   . MI (myocardial infarction) ([place]) not sure when   scar tissue saw  . Peyronie's disease   . Pituitary adenoma ([place]) 10/13/2011  . Pituitary tumor    takes medication to manage  . Prostatitis   . Pulmonary embolism ([place]) yrs ago  . Rash    last 3 months chest and arms, saw md no tx given  . 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 Stroke [facility]) 2017   mild cva    Past Surgical History:  Procedure Laterality Date  . COLONOSCOPY N/A 08/22/2016   Procedure: COLONOSCOPY;  Surgeon: Garlan Fair, MD;  [facility];  Service: Endoscopy;  Laterality: N/A;  . colonscopy  2007  . EYE SURGERY     bilateral cataract surgery  . HERNIA REPAIR  1960  . INGUINAL HERNIA REPAIR  08/12/2011   Procedure: LAPAROSCOPIC INGUINAL HERNIA;  Surgeon: [name], MD;  Location: [place];  Service: General;  Laterality: Left;  . [place]   left   . LEFT HEART CATHETERIZATION WITH CORONARY ANGIOGRAM N/A 05/06/2014   Procedure: LEFT HEART CATHETERIZATION WITH CORONARY ANGIOGRAM;  Surgeon: [name], MD;  Location: [facility] CATH LAB;  Service: Cardiovascular;  Laterality: N/A;  . TONSILLECTOMY     as a child    Family History  Problem Relation Age of Onset  . Pulmonary embolism Mother   . Kidney failure Mother   . Hypertension Mother   . Kidney Stones Mother   . Pulmonary embolism Father   . Prostate cancer Father    . Pulmonary embolism Maternal Grandmother   . Pulmonary embolism Maternal Grandfather   . Pulmonary embolism Paternal Grandmother   . Pulmonary embolism Paternal Grandfather     Social History:  reports that he quit smoking about 49 years ago. His smoking use included cigarettes. He has a 1.50 pack-year smoking history. He has never used smokeless tobacco. He reports that he does not drink alcohol or use drugs.  ROS  Followed by PCP for hypertension, now on metoprolol  No recent dizziness  General Examination:   BP 110/70 (BP Location: Left Arm, Patient Position: Standing, Cuff Size: Normal)   Pulse 63   Ht 6' (1.829 m)   Wt 186 lb 6.4 oz (84.6 kg)   SpO2 98%   BMI 25.28 kg/m     Assessment/ Plan:  PROLACTINOMA: He had a 1.4 cm pituitary tumor at baseline in 2003 and his initial prolactin level was high at 99  His initial symptoms were mostly related to decreased libido  Had been well controlled with low doses of bromocriptine for several years now Was last on bromocriptine 1.25 mg 4 times a week  With a trial of stopping his bromocriptine his prolactin has gone up to 43.6 compared to 10.4 He likely was not however asymsymptomatic Now back on bromocriptine and prolactin is back to normal  His testosterone level and free T4 previously normal  Recent MRI of brain did not show any gross pituitary enlargement but can have radiologist reevaluate this  Recommendations: With his prolactin level being very normal now with taking bromocriptine 5 days a week he will continue the same dose Follow-up in 6 months Recheck pituitary functions every year    Elayne Snare 08/26/2019, 4:16 PM

## 2019-08-28 NOTE — Progress Notes (Signed)
PCP - Maryan Char, MD  Cardiologist - Jerline Pain, MD w/ cardiac clearance dated 08-14-19 per Richardson Dopp, PA-C  Chest x-ray - 07-05-19 EKG - 08-13-19 Stress Test -  ECHO - 07-09-19 Cardiac Cath -   Sleep Study -  CPAP -   Fasting Blood Sugar -  Checks Blood Sugar _____ times a day  Blood Thinner Instructions: Axpixaban w/PCP Aspirin Instructions: Last Dose:  Anesthesia review: Hx of PE, DVT, HTN, CVA, Clotting Disorder, MI  Patient denies shortness of breath, fever, cough and chest pain at PAT appointment   Patient verbalized understanding of instructions that were given to them at the PAT appointment. Patient was also instructed that they will need to review over the PAT instructions again at home before surgery.

## 2019-08-28 NOTE — Patient Instructions (Addendum)
DUE TO COVID-19 ONLY ONE VISITOR IS ALLOWED TO COME WITH YOU AND STAY IN THE WAITING ROOM ONLY DURING PRE OP AND PROCEDURE DAY OF SURGERY. THE 1 VISITOR MAY VISIT WITH YOU AFTER SURGERY IN YOUR PRIVATE ROOM DURING VISITING HOURS ONLY!  YOU NEED TO HAVE A COVID 19 TEST ON 09-03-19 @ 3:00 PM, THIS TEST MUST BE DONE BEFORE SURGERY, COME  [place], [address] , [id number].  ([place]) ONCE YOUR COVID TEST IS COMPLETED, PLEASE BEGIN THE QUARANTINE INSTRUCTIONS AS OUTLINED IN YOUR HANDOUT.                Jay Day  08/28/2019   Your procedure is scheduled on: 09-06-19    Report to [facility] Main  Entrance    Report to Admitting at 5:30 AM     Call this number if you have problems the morning of surgery [phone]    Remember: Please consume a Clear Liquid Diet the Day Prior to Surgery, per your surgeon's instructions. Do not eat food or drink liquids after Midnight.      CLEAR LIQUID DIET   Foods Allowed                                                                     Foods Excluded  Coffee and tea, regular and decaf                             liquids that you cannot  Plain Jell-O any favor except red or purple                                           see through such as: Fruit ices (not with fruit pulp)                                     milk, soups, orange juice  Iced Popsicles                                    All solid food Carbonated beverages, regular and diet                                    Cranberry, grape and apple juices Sports drinks like Gatorade Lightly seasoned clear broth or consume(fat free) Sugar, honey syrup  Sample Menu Breakfast                                Lunch                                     Supper Cranberry juice  Beef broth                            Chicken broth Jell-O                                     Grape juice                           Apple juice Coffee or tea                         Jell-O                                      Popsicle                                                Coffee or tea                        Coffee or tea  _____________________________________________________________________    Take these medicines the morning of surgery with A SIP OF WATER: You may use or bring your eyedrops  BRUSH YOUR TEETH MORNING OF SURGERY AND RINSE YOUR MOUTH OUT, NO CHEWING GUM CANDY OR MINTS.                                You may not have any metal on your body including hair pins and              piercings    Do not wear jewelry, cologne, lotions, powders or deodorant                     Men may shave face and neck.   Do not bring valuables to the hospital. [place].  Contacts, dentures or bridgework may not be worn into surgery.  You may bring an overnight bag     Special Instructions: N/A              Please read over the following fact sheets you were given: _____________________________________________________________________             [facility] - Preparing for Surgery Before surgery, you can play an important role.  Because skin is not sterile, your skin needs to be as free of germs as possible.  You can reduce the number of germs on your skin by washing with CHG (chlorahexidine gluconate) soap before surgery.  CHG is an antiseptic cleaner which kills germs and bonds with the skin to continue killing germs even after washing. Please DO NOT use if you have an allergy to CHG or antibacterial soaps.  If your skin becomes reddened/irritated stop using the CHG and inform your nurse when you arrive at Short Stay. Do not shave (including legs and underarms) for at least 48 hours prior to the first CHG shower.  You may shave your face/neck. Please  follow these instructions carefully:  1.  Shower with CHG Soap the night before surgery and the  morning of Surgery.  2.  If you choose to wash your  hair, wash your hair first as usual with your  normal  shampoo.  3.  After you shampoo, rinse your hair and body thoroughly to remove the  shampoo.                           4.  Use CHG as you would any other liquid soap.  You can apply chg directly  to the skin and wash                       Gently with a scrungie or clean washcloth.  5.  Apply the CHG Soap to your body ONLY FROM THE NECK DOWN.   Do not use on face/ open                           Wound or open sores. Avoid contact with eyes, ears mouth and genitals (private parts).                       Wash face,  Genitals (private parts) with your normal soap.             6.  Wash thoroughly, paying special attention to the area where your surgery  will be performed.  7.  Thoroughly rinse your body with warm water from the neck down.  8.  DO NOT shower/wash with your normal soap after using and rinsing off  the CHG Soap.                9.  Pat yourself dry with a clean towel.            10.  Wear clean pajamas.            11.  Place clean sheets on your bed the night of your first shower and do not  sleep with pets. Day of Surgery : Do not apply any lotions/deodorants the morning of surgery.  Please wear clean clothes to the hospital/surgery center.  FAILURE TO FOLLOW THESE INSTRUCTIONS MAY RESULT IN THE CANCELLATION OF YOUR SURGERY PATIENT SIGNATURE_________________________________  NURSE SIGNATURE__________________________________  ________________________________________________________________________

## 2019-08-30 ENCOUNTER — Other Ambulatory Visit: Payer: Self-pay

## 2019-08-30 ENCOUNTER — Encounter (HOSPITAL_COMMUNITY): Payer: Self-pay

## 2019-08-30 ENCOUNTER — Encounter (HOSPITAL_COMMUNITY)
Admission: RE | Admit: 2019-08-30 | Discharge: 2019-08-30 | Disposition: A | Discharge status: Home / Self Care | Payer: Medicare Other | Source: Ambulatory Visit | Attending: Urology | Admitting: Urology

## 2019-08-30 DIAGNOSIS — Z20822 Contact with and (suspected) exposure to covid-19: Secondary | ICD-10-CM | POA: Insufficient documentation

## 2019-08-30 DIAGNOSIS — Z01812 Encounter for preprocedural laboratory examination: Secondary | ICD-10-CM | POA: Insufficient documentation

## 2019-08-30 LAB — BASIC METABOLIC PANEL
Anion gap: 7 (ref 5–15)
BUN: 25 mg/dL — ABNORMAL HIGH (ref 8–23)
CO2: 25 mmol/L (ref 22–32)
Calcium: 9.2 mg/dL (ref 8.9–10.3)
Chloride: 111 mmol/L (ref 98–111)
Creatinine, Ser: 1.55 mg/dL — ABNORMAL HIGH (ref 0.61–1.24)
GFR calc Af Amer: 49 mL/min — ABNORMAL LOW (ref >60)
GFR calc non Af Amer: 43 mL/min — ABNORMAL LOW (ref >60)
Glucose, Bld: 104 mg/dL — ABNORMAL HIGH (ref 70–99)
Potassium: 4.4 mmol/L (ref 3.5–5.1)
Sodium: 143 mmol/L (ref 135–145)

## 2019-08-30 LAB — CBC
HCT: 37.3 % — ABNORMAL LOW (ref 39.0–52.0)
Hemoglobin: 12.6 g/dL — ABNORMAL LOW (ref 13.0–17.0)
MCH: 35 pg — ABNORMAL HIGH (ref 26.0–34.0)
MCHC: 33.8 g/dL (ref 30.0–36.0)
MCV: 103.6 fL — ABNORMAL HIGH (ref 80.0–100.0)
Platelets: 163 10*3/uL (ref 150–400)
RBC: 3.6 MIL/uL — ABNORMAL LOW (ref 4.22–5.81)
RDW: 12.5 % (ref 11.5–15.5)
WBC: 7.6 10*3/uL (ref 4.0–10.5)
nRBC: 0 % (ref 0.0–0.2)

## 2019-09-02 NOTE — Progress Notes (Signed)
Anesthesia Chart Review   Case: [id number] Date/Time: 09/06/19 0700   Procedures:      XI ROBOTIC ASSISTED LAPAROSCOPIC RADICAL PROSTATECTOMY (N/A ) - 3 HRS     LYMPHADENECTOMY (Bilateral )   Anesthesia type: General   Pre-op diagnosis: PROSTATE CANCER, URINARY RETENTION   [facility] RO[facility] / [facility]   Surgeons: Alexis Frock, MD      DISCUSSION:79 y.o. former smoker (1.5 pack years, quit 06/13/70) with h/o HTN, Stroke, DVT, PE (on Eliquis), NICM, CAD, prolactinoma (well controlled on bromocriptine), prostate cancer, urinary retention scheduled for above procedure 09/06/19 with Dr. Alexis Frock.   Pt last seen by cardiology 08/13/19 for preoperative evaluation.  Per OV note, "He does not have any unstable cardiac conditions.  He had mild plaque on cardiac catheterization in 2015.  Myoview prior to that did indicate prior apical infarct.  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  His perioperative risk of major cardiac event is elevated at 6.6% according to the Revised Cardiac Risk Index (RCRI).  However, he has been very active (until the issues with his prostate).  He functional status is good at 6.98 METs according to the Duke Activity Status Index (DASI).  Therefore, according to ACC/AHA guidelines, he does not require further CV testing prior to his procedure and may proceed at acceptable risk."  Anticipate pt can proceed with planned procedure barring acute status change.   VS: BP 119/73   Pulse (!) 56   Temp 36.7 C (Oral)   Resp 16   Ht 6' (1.829 m)   Wt 83.9 kg   SpO2 99%   BMI 25.09 kg/m   PROVIDERS: Leeroy Cha, MD is PCP   [name], MD is Cardiologist   Elayne Snare, MD is Endocrinologist last seen 08/26/19 LABS: Labs reviewed: Acceptable for surgery. (all labs ordered are listed, but only abnormal results are displayed)  Labs Reviewed  BASIC METABOLIC PANEL - Abnormal;  Notable for the following components:      Result Value   Glucose, Bld 104 (*)    BUN 25 (*)    Creatinine, Ser 1.55 (*)    GFR calc non Af Amer 43 (*)    GFR calc Af Amer 49 (*)    All other components within normal limits  CBC - Abnormal; Notable for the following components:   RBC 3.60 (*)    Hemoglobin 12.6 (*)    HCT 37.3 (*)    MCV 103.6 (*)    MCH 35.0 (*)    All other components within normal limits     IMAGES:   EKG: 08/13/19 Rate 53 bpm    CV: Echo 07/09/2019 IMPRESSIONS    1. Left ventricular ejection fraction, by visual estimation, is 55 to  60%. The left ventricle has normal function. There is no left ventricular  hypertrophy.  2. Definity contrast agent was given IV to delineate the left ventricular  endocardial borders.  3. 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 No  thrombus is seen.  4. Left ventricular diastolic parameters are consistent with Grade I  diastolic dysfunction (impaired relaxation).  5. The left ventricle demonstrates regional wall motion abnormalities.  6. Global right ventricle has normal systolic function.The right  ventricular size is normal. No increase in right ventricular wall  thickness.  7. Left atrial size was normal.  8. Right atrial size was normal.  9. The mitral valve  is normal in structure. Mild mitral valve  regurgitation. No evidence of mitral stenosis.  10. The tricuspid valve is normal in structure.  11. The tricuspid valve is normal in structure. Tricuspid valve  regurgitation is not demonstrated.  12. The aortic valve is normal in structure. Aortic valve regurgitation is  not visualized. No evidence of aortic valve sclerosis or stenosis.  13. The pulmonic valve was normal in structure. Pulmonic valve  regurgitation is not visualized.  14. The inferior vena cava is normal in size with greater than 50%  respiratory variability, suggesting right atrial pressure of 3 mmHg.  15. No  significant change from prior study. The small apical aneurysm was  seen on that study as well.  16. Prior images reviewed side by side.  17. No evidence of valvular vegetations on this transthoracic  echocardiogram. Would recommend a transesophageal echocardiogram to  exclude infective endocarditis if clinically indicated.  Past Medical History:  Diagnosis Date  . Asthma    hx of as child  . Bronchitis    as a child  . Cataract   . Chronic anticoagulation 06/11/2013  . Clotting disorder ([place])   . 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 Elevated PSA    being monitored by physician  . Glaucoma 10/13/2011  . 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 Headache disorder 02/24/2015   occ  . Hypertension    sees [name], primary   . MI (myocardial infarction) ([place]) not sure when   scar tissue saw  . Peyronie's disease   . 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 Pituitary tumor    takes medication to manage  . Prostatitis   . Pulmonary embolism ([place]) yrs ago  . Rash    last 3 months chest and arms, saw md no tx given  . 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 Stroke [facility]) 2017   mild cva    Past Surgical History:  Procedure Laterality Date  . COLONOSCOPY N/A 08/22/2016   Procedure: COLONOSCOPY;  Surgeon: Garlan Fair, MD;  [facility];  Service: Endoscopy;  Laterality: N/A;  . colonscopy  2007  . EYE SURGERY     bilateral cataract surgery  . HERNIA REPAIR  1960  . INGUINAL HERNIA REPAIR  08/12/2011   Procedure: LAPAROSCOPIC INGUINAL HERNIA;  Surgeon: [name], MD;  Location: [place];  Service: General;  Laterality: Left;  . [place]   left   . LEFT HEART CATHETERIZATION WITH CORONARY ANGIOGRAM N/A 05/06/2014   Procedure: LEFT HEART CATHETERIZATION  WITH CORONARY ANGIOGRAM;  Surgeon: [name], MD;  Location[facility] CATH LAB;  Service: Cardiovascular;  Laterality: N/A;  . TONSILLECTOMY     as a child    MEDICATIONS: . bromocriptine (PARLODEL) 2.5 MG tablet  . dorzolamide-timolol (COSOPT) 22.3-6.8 MG/ML ophthalmic solution  . ELIQUIS 5 MG TABS tablet  . latanoprost (XALATAN) 0.005 % ophthalmic solution  . metoprolol succinate (TOPROL-XL) 25 MG 24 hr tablet  . Multiple Vitamin (MULTIVITAMIN WITH MINERALS) TABS tablet  . Probiotic Product (PROBIOTIC ADVANCED PO)  . Zinc 22.5 MG TABS   No current facility-administered medications for this encounter.     Maia Plan [facility] Pre-Surgical Testing [phone] 09/02/19  1:15 PM

## 2019-09-03 ENCOUNTER — Other Ambulatory Visit (HOSPITAL_COMMUNITY)
Admission: RE | Admit: 2019-09-03 | Discharge: 2019-09-03 | Disposition: A | Discharge status: Home / Self Care | Payer: Medicare Other | Source: Ambulatory Visit | Attending: Urology | Admitting: Urology

## 2019-09-03 DIAGNOSIS — C61 Malignant neoplasm of prostate: Secondary | ICD-10-CM | POA: Diagnosis not present

## 2019-09-03 DIAGNOSIS — Z79899 Other long term (current) drug therapy: Secondary | ICD-10-CM | POA: Diagnosis not present

## 2019-09-03 DIAGNOSIS — Z87891 Personal history of nicotine dependence: Secondary | ICD-10-CM | POA: Diagnosis not present

## 2019-09-03 DIAGNOSIS — Z86711 Personal history of pulmonary embolism: Secondary | ICD-10-CM | POA: Diagnosis not present

## 2019-09-03 DIAGNOSIS — Z8673 Personal history of transient ischemic attack (TIA), and cerebral infarction without residual deficits: Secondary | ICD-10-CM | POA: Diagnosis not present

## 2019-09-03 DIAGNOSIS — Z86718 Personal history of other venous thrombosis and embolism: Secondary | ICD-10-CM | POA: Diagnosis not present

## 2019-09-03 DIAGNOSIS — Z20822 Contact with and (suspected) exposure to covid-19: Secondary | ICD-10-CM | POA: Diagnosis not present

## 2019-09-03 DIAGNOSIS — I252 Old myocardial infarction: Secondary | ICD-10-CM | POA: Diagnosis not present

## 2019-09-03 DIAGNOSIS — I10 Essential (primary) hypertension: Secondary | ICD-10-CM | POA: Diagnosis not present

## 2019-09-03 DIAGNOSIS — Z7901 Long term (current) use of anticoagulants: Secondary | ICD-10-CM | POA: Diagnosis not present

## 2019-09-03 LAB — SARS CORONAVIRUS 2 (TAT 6-24 HRS): SARS Coronavirus 2: NEGATIVE

## 2019-09-04 ENCOUNTER — Other Ambulatory Visit: Payer: Self-pay | Admitting: Endocrinology

## 2019-09-06 ENCOUNTER — Observation Stay (HOSPITAL_COMMUNITY)
Admission: RE | Admit: 2019-09-06 | Discharge: 2019-09-07 | Disposition: A | Discharge status: Home / Self Care | Payer: Medicare Other | Attending: Urology | Admitting: Urology

## 2019-09-06 ENCOUNTER — Encounter (HOSPITAL_COMMUNITY)
Admission: RE | Disposition: A | Discharge status: Home / Self Care | Payer: Self-pay | Source: Home / Self Care | Attending: Urology

## 2019-09-06 ENCOUNTER — Ambulatory Visit (HOSPITAL_COMMUNITY): Payer: Medicare Other | Admitting: Physician Assistant

## 2019-09-06 ENCOUNTER — Ambulatory Visit (HOSPITAL_COMMUNITY): Payer: Medicare Other | Admitting: Anesthesiology

## 2019-09-06 ENCOUNTER — Encounter (HOSPITAL_COMMUNITY): Payer: Self-pay | Admitting: Urology

## 2019-09-06 DIAGNOSIS — Z86711 Personal history of pulmonary embolism: Secondary | ICD-10-CM | POA: Insufficient documentation

## 2019-09-06 DIAGNOSIS — C61 Malignant neoplasm of prostate: Secondary | ICD-10-CM | POA: Diagnosis not present

## 2019-09-06 DIAGNOSIS — Z8673 Personal history of transient ischemic attack (TIA), and cerebral infarction without residual deficits: Secondary | ICD-10-CM | POA: Insufficient documentation

## 2019-09-06 DIAGNOSIS — Z86718 Personal history of other venous thrombosis and embolism: Secondary | ICD-10-CM | POA: Insufficient documentation

## 2019-09-06 DIAGNOSIS — Z20822 Contact with and (suspected) exposure to covid-19: Secondary | ICD-10-CM | POA: Diagnosis not present

## 2019-09-06 DIAGNOSIS — Z79899 Other long term (current) drug therapy: Secondary | ICD-10-CM | POA: Insufficient documentation

## 2019-09-06 DIAGNOSIS — Z87891 Personal history of nicotine dependence: Secondary | ICD-10-CM | POA: Diagnosis not present

## 2019-09-06 DIAGNOSIS — I252 Old myocardial infarction: Secondary | ICD-10-CM | POA: Insufficient documentation

## 2019-09-06 DIAGNOSIS — J189 Pneumonia, unspecified organism: Secondary | ICD-10-CM | POA: Diagnosis not present

## 2019-09-06 DIAGNOSIS — Z7901 Long term (current) use of anticoagulants: Secondary | ICD-10-CM | POA: Diagnosis not present

## 2019-09-06 DIAGNOSIS — I10 Essential (primary) hypertension: Secondary | ICD-10-CM | POA: Insufficient documentation

## 2019-09-06 DIAGNOSIS — R338 Other retention of urine: Secondary | ICD-10-CM | POA: Diagnosis not present

## 2019-09-06 HISTORY — PX: LYMPHADENECTOMY: SHX5960

## 2019-09-06 HISTORY — PX: ROBOT ASSISTED LAPAROSCOPIC RADICAL PROSTATECTOMY: SHX5141

## 2019-09-06 LAB — CBC
HCT: 39.7 % (ref 39.0–52.0)
Hemoglobin: 13.2 g/dL (ref 13.0–17.0)
MCH: 34.3 pg — ABNORMAL HIGH (ref 26.0–34.0)
MCHC: 33.2 g/dL (ref 30.0–36.0)
MCV: 103.1 fL — ABNORMAL HIGH (ref 80.0–100.0)
Platelets: 143 10*3/uL — ABNORMAL LOW (ref 150–400)
RBC: 3.85 MIL/uL — ABNORMAL LOW (ref 4.22–5.81)
RDW: 12.5 % (ref 11.5–15.5)
WBC: 9.2 10*3/uL (ref 4.0–10.5)
nRBC: 0 % (ref 0.0–0.2)

## 2019-09-06 LAB — HEMOGLOBIN AND HEMATOCRIT, BLOOD
HCT: 38.1 % — ABNORMAL LOW (ref 39.0–52.0)
Hemoglobin: 12.6 g/dL — ABNORMAL LOW (ref 13.0–17.0)

## 2019-09-06 LAB — CREATININE, SERUM
Creatinine, Ser: 1.38 mg/dL — ABNORMAL HIGH (ref 0.61–1.24)
GFR calc Af Amer: 57 mL/min — ABNORMAL LOW (ref >60)
GFR calc non Af Amer: 49 mL/min — ABNORMAL LOW (ref >60)

## 2019-09-06 SURGERY — PROSTATECTOMY, RADICAL, ROBOT-ASSISTED, LAPAROSCOPIC
Anesthesia: General

## 2019-09-06 MED ORDER — SULFAMETHOXAZOLE-TRIMETHOPRIM 800-160 MG PO TABS: 1.0000 | ORAL_TABLET | Freq: Two times a day (BID) | ORAL | 0 refills | Status: DC

## 2019-09-06 MED ORDER — SODIUM CHLORIDE (PF) 0.9 % IJ SOLN
INTRAMUSCULAR | Status: AC
  Filled 2019-09-06: qty 20

## 2019-09-06 MED ORDER — LACTATED RINGERS IV SOLN: INTRAVENOUS | Status: DC

## 2019-09-06 MED ORDER — ROCURONIUM BROMIDE 10 MG/ML (PF) SYRINGE
PREFILLED_SYRINGE | INTRAVENOUS | Status: AC
  Filled 2019-09-06: qty 10

## 2019-09-06 MED ORDER — CHLORHEXIDINE GLUCONATE CLOTH 2 % EX PADS
6.0000 | MEDICATED_PAD | Freq: Every day | CUTANEOUS | Status: DC
  Administered 2019-09-07: 6 via TOPICAL

## 2019-09-06 MED ORDER — BELLADONNA ALKALOIDS-OPIUM 16.2-60 MG RE SUPP: 1.0000 | Freq: Four times a day (QID) | RECTAL | Status: DC | PRN

## 2019-09-06 MED ORDER — ROCURONIUM BROMIDE 10 MG/ML (PF) SYRINGE
PREFILLED_SYRINGE | INTRAVENOUS | Status: DC | PRN
  Administered 2019-09-06: 10 mg via INTRAVENOUS
  Administered 2019-09-06: 70 mg via INTRAVENOUS
  Administered 2019-09-06: 20 mg via INTRAVENOUS

## 2019-09-06 MED ORDER — DIPHENHYDRAMINE HCL 50 MG/ML IJ SOLN: 12.5000 mg | Freq: Four times a day (QID) | INTRAMUSCULAR | Status: DC | PRN

## 2019-09-06 MED ORDER — SODIUM CHLORIDE (PF) 0.9 % IJ SOLN
INTRAMUSCULAR | Status: DC | PRN
  Administered 2019-09-06: 20 mL

## 2019-09-06 MED ORDER — ACETAMINOPHEN 325 MG PO TABS: 325.0000 mg | ORAL_TABLET | Freq: Once | ORAL | Status: DC | PRN

## 2019-09-06 MED ORDER — PROMETHAZINE HCL 25 MG/ML IJ SOLN: 6.2500 mg | INTRAMUSCULAR | Status: DC | PRN

## 2019-09-06 MED ORDER — LATANOPROST 0.005 % OP SOLN
1.0000 [drp] | Freq: Every day | OPHTHALMIC | Status: DC
  Administered 2019-09-06: 1 [drp] via OPHTHALMIC
  Filled 2019-09-06: qty 2.5

## 2019-09-06 MED ORDER — BACITRACIN-NEOMYCIN-POLYMYXIN 400-5-5000 EX OINT: 1.0000 "application " | TOPICAL_OINTMENT | Freq: Three times a day (TID) | CUTANEOUS | Status: DC | PRN

## 2019-09-06 MED ORDER — DORZOLAMIDE HCL-TIMOLOL MAL 2-0.5 % OP SOLN
1.0000 [drp] | Freq: Two times a day (BID) | OPHTHALMIC | Status: DC
  Administered 2019-09-07: 1 [drp] via OPHTHALMIC

## 2019-09-06 MED ORDER — DEXAMETHASONE SODIUM PHOSPHATE 10 MG/ML IJ SOLN
INTRAMUSCULAR | Status: AC
  Filled 2019-09-06: qty 1

## 2019-09-06 MED ORDER — HYDROMORPHONE HCL 2 MG/ML IJ SOLN
INTRAMUSCULAR | Status: AC
  Filled 2019-09-06: qty 1

## 2019-09-06 MED ORDER — HYDROMORPHONE HCL 1 MG/ML IJ SOLN
INTRAMUSCULAR | Status: DC | PRN
  Administered 2019-09-06 (×2): .5 mg via INTRAVENOUS

## 2019-09-06 MED ORDER — MAGNESIUM CITRATE PO SOLN: 1.0000 | Freq: Once | ORAL | Status: DC

## 2019-09-06 MED ORDER — ACETAMINOPHEN 500 MG PO TABS
1000.0000 mg | ORAL_TABLET | Freq: Four times a day (QID) | ORAL | Status: DC
  Administered 2019-09-06: 1000 mg via ORAL
  Filled 2019-09-06 (×2): qty 2

## 2019-09-06 MED ORDER — PROPOFOL 10 MG/ML IV BOLUS
INTRAVENOUS | Status: DC | PRN
  Administered 2019-09-06: 140 mg via INTRAVENOUS

## 2019-09-06 MED ORDER — ACETAMINOPHEN 160 MG/5ML PO SOLN: 325.0000 mg | Freq: Once | ORAL | Status: DC | PRN

## 2019-09-06 MED ORDER — HYDROMORPHONE HCL 1 MG/ML IJ SOLN: 0.2500 mg | INTRAMUSCULAR | Status: DC | PRN

## 2019-09-06 MED ORDER — BUPIVACAINE LIPOSOME 1.3 % IJ SUSP
20.0000 mL | Freq: Once | INTRAMUSCULAR | Status: AC
  Administered 2019-09-06: 20 mL
  Filled 2019-09-06: qty 20

## 2019-09-06 MED ORDER — PROPOFOL 10 MG/ML IV BOLUS
INTRAVENOUS | Status: AC
  Filled 2019-09-06: qty 20

## 2019-09-06 MED ORDER — LATANOPROST 0.005 % OP SOLN: 1.0000 [drp] | Freq: Every day | OPHTHALMIC | Status: DC

## 2019-09-06 MED ORDER — SUFENTANIL CITRATE 50 MCG/ML IV SOLN
INTRAVENOUS | Status: AC
  Filled 2019-09-06: qty 1

## 2019-09-06 MED ORDER — DORZOLAMIDE HCL-TIMOLOL MAL 2-0.5 % OP SOLN
1.0000 [drp] | Freq: Two times a day (BID) | OPHTHALMIC | Status: DC
  Administered 2019-09-06: 1 [drp] via OPHTHALMIC
  Filled 2019-09-06: qty 10

## 2019-09-06 MED ORDER — ONDANSETRON HCL 4 MG/2ML IJ SOLN
INTRAMUSCULAR | Status: AC
  Filled 2019-09-06: qty 2

## 2019-09-06 MED ORDER — METOPROLOL SUCCINATE ER 50 MG PO TB24
50.0000 mg | ORAL_TABLET | Freq: Every day | ORAL | Status: DC
  Administered 2019-09-06 – 2019-09-07 (×2): 50 mg via ORAL
  Filled 2019-09-06 (×2): qty 1

## 2019-09-06 MED ORDER — ENOXAPARIN SODIUM 40 MG/0.4ML ~~LOC~~ SOLN
40.0000 mg | SUBCUTANEOUS | Status: DC
  Administered 2019-09-07: 40 mg via SUBCUTANEOUS
  Filled 2019-09-06: qty 0.4

## 2019-09-06 MED ORDER — LACTATED RINGERS IV SOLN: INTRAVENOUS | Status: DC | PRN

## 2019-09-06 MED ORDER — LIDOCAINE 2% (20 MG/ML) 5 ML SYRINGE
INTRAMUSCULAR | Status: DC | PRN
  Administered 2019-09-06: 100 mg via INTRAVENOUS

## 2019-09-06 MED ORDER — SODIUM CHLORIDE 0.9 % IV SOLN
2.0000 g | INTRAVENOUS | Status: AC
  Administered 2019-09-06: 2 g via INTRAVENOUS
  Filled 2019-09-06: qty 20

## 2019-09-06 MED ORDER — DOCUSATE SODIUM 100 MG PO CAPS
100.0000 mg | ORAL_CAPSULE | Freq: Two times a day (BID) | ORAL | Status: DC
  Administered 2019-09-06 – 2019-09-07 (×2): 100 mg via ORAL
  Filled 2019-09-06 (×2): qty 1

## 2019-09-06 MED ORDER — ONDANSETRON HCL 4 MG/2ML IJ SOLN
INTRAMUSCULAR | Status: DC | PRN
  Administered 2019-09-06: 4 mg via INTRAVENOUS

## 2019-09-06 MED ORDER — LACTATED RINGERS IR SOLN
Status: DC | PRN
  Administered 2019-09-06: 1000 mL

## 2019-09-06 MED ORDER — HYDROCODONE-ACETAMINOPHEN 5-325 MG PO TABS: 1.0000 | ORAL_TABLET | Freq: Four times a day (QID) | ORAL | 0 refills | Status: DC | PRN

## 2019-09-06 MED ORDER — LIDOCAINE 2% (20 MG/ML) 5 ML SYRINGE
INTRAMUSCULAR | Status: AC
  Filled 2019-09-06: qty 5

## 2019-09-06 MED ORDER — ONDANSETRON HCL 4 MG/2ML IJ SOLN: 4.0000 mg | INTRAMUSCULAR | Status: DC | PRN

## 2019-09-06 MED ORDER — STERILE WATER FOR IRRIGATION IR SOLN
Status: DC | PRN
  Administered 2019-09-06: 1000 mL

## 2019-09-06 MED ORDER — DIPHENHYDRAMINE HCL 12.5 MG/5ML PO ELIX: 12.5000 mg | ORAL_SOLUTION | Freq: Four times a day (QID) | ORAL | Status: DC | PRN

## 2019-09-06 MED ORDER — EPHEDRINE 5 MG/ML INJ
INTRAVENOUS | Status: AC
  Filled 2019-09-06: qty 10

## 2019-09-06 MED ORDER — SODIUM CHLORIDE 0.9 % IV BOLUS
1000.0000 mL | Freq: Once | INTRAVENOUS | Status: AC
  Administered 2019-09-06: 1000 mL via INTRAVENOUS

## 2019-09-06 MED ORDER — ATROPINE SULFATE 0.4 MG/ML IV SOSY
PREFILLED_SYRINGE | INTRAVENOUS | Status: DC | PRN
  Administered 2019-09-06: .4 mg via INTRAVENOUS

## 2019-09-06 MED ORDER — DEXTROSE-NACL 5-0.45 % IV SOLN: INTRAVENOUS | Status: DC

## 2019-09-06 MED ORDER — ACETAMINOPHEN 10 MG/ML IV SOLN: 1000.0000 mg | Freq: Once | INTRAVENOUS | Status: DC | PRN

## 2019-09-06 MED ORDER — EPHEDRINE SULFATE-NACL 50-0.9 MG/10ML-% IV SOSY
PREFILLED_SYRINGE | INTRAVENOUS | Status: DC | PRN
  Administered 2019-09-06 (×3): 10 mg via INTRAVENOUS

## 2019-09-06 MED ORDER — SUFENTANIL CITRATE 50 MCG/ML IV SOLN
INTRAVENOUS | Status: DC | PRN
  Administered 2019-09-06 (×3): 10 ug via INTRAVENOUS
  Administered 2019-09-06: 20 ug via INTRAVENOUS

## 2019-09-06 MED ORDER — SUGAMMADEX SODIUM 200 MG/2ML IV SOLN
INTRAVENOUS | Status: DC | PRN
  Administered 2019-09-06: 200 mg via INTRAVENOUS

## 2019-09-06 MED ORDER — HYDROMORPHONE HCL 1 MG/ML IJ SOLN: 0.5000 mg | INTRAMUSCULAR | Status: DC | PRN

## 2019-09-06 MED ORDER — CIPROFLOXACIN IN D5W 400 MG/200ML IV SOLN
400.0000 mg | Freq: Once | INTRAVENOUS | Status: AC
  Administered 2019-09-06: 400 mg via INTRAVENOUS
  Filled 2019-09-06: qty 200

## 2019-09-06 MED ORDER — OXYCODONE HCL 5 MG PO TABS: 5.0000 mg | ORAL_TABLET | ORAL | Status: DC | PRN

## 2019-09-06 MED ORDER — SODIUM CHLORIDE (PF) 0.9 % IJ SOLN
INTRAMUSCULAR | Status: AC
  Filled 2019-09-06: qty 10

## 2019-09-06 MED ORDER — HYDRALAZINE HCL 20 MG/ML IJ SOLN
INTRAMUSCULAR | Status: AC
  Filled 2019-09-06: qty 1

## 2019-09-06 MED ORDER — HYDRALAZINE HCL 20 MG/ML IJ SOLN
5.0000 mg | INTRAMUSCULAR | Status: DC | PRN
  Administered 2019-09-06 (×2): 5 mg via INTRAVENOUS

## 2019-09-06 MED ORDER — DEXAMETHASONE SODIUM PHOSPHATE 10 MG/ML IJ SOLN
INTRAMUSCULAR | Status: DC | PRN
  Administered 2019-09-06: 10 mg via INTRAVENOUS

## 2019-09-06 SURGICAL SUPPLY — 71 items
ADH SKN CLS APL DERMABOND .7 (GAUZE/BANDAGES/DRESSINGS) ×2
APL PRP STRL LF DISP 70% ISPRP (MISCELLANEOUS) ×2
APL SWBSTK 6 STRL LF DISP (MISCELLANEOUS) ×2
APPLICATOR COTTON TIP 6 STRL (MISCELLANEOUS) ×2 IMPLANT
APPLICATOR COTTON TIP 6IN STRL (MISCELLANEOUS) ×3
CATH FOLEY 2WAY SLVR 18FR 30CC (CATHETERS) ×3 IMPLANT
CATH TIEMANN FOLEY 18FR 5CC (CATHETERS) ×3 IMPLANT
CHLORAPREP W/TINT 26 (MISCELLANEOUS) ×3 IMPLANT
CLIP VESOLOCK LG 6/CT PURPLE (CLIP) ×10 IMPLANT
CNTNR URN SCR LID CUP LEK RST (MISCELLANEOUS) ×2 IMPLANT
CONT SPEC 4OZ STRL OR WHT (MISCELLANEOUS) ×3
COVER SURGICAL LIGHT HANDLE (MISCELLANEOUS) ×3 IMPLANT
COVER TIP SHEARS 8 DVNC (MISCELLANEOUS) ×2 IMPLANT
COVER TIP SHEARS 8MM DA VINCI (MISCELLANEOUS) ×3
COVER WAND RF STERILE (DRAPES) IMPLANT
CUTTER ECHEON FLEX ENDO 45 340 (ENDOMECHANICALS) ×3 IMPLANT
DECANTER SPIKE VIAL GLASS SM (MISCELLANEOUS) ×3 IMPLANT
DERMABOND ADVANCED (GAUZE/BANDAGES/DRESSINGS) ×1
DERMABOND ADVANCED .7 DNX12 (GAUZE/BANDAGES/DRESSINGS) ×2 IMPLANT
DRAIN CHANNEL RND F F (WOUND CARE) IMPLANT
DRAPE ARM DVNC X/XI (DISPOSABLE) ×8 IMPLANT
DRAPE COLUMN DVNC XI (DISPOSABLE) ×2 IMPLANT
DRAPE DA VINCI XI ARM (DISPOSABLE) ×12
DRAPE DA VINCI XI COLUMN (DISPOSABLE) ×3
DRAPE SURG IRRIG POUCH 19X23 (DRAPES) ×3 IMPLANT
DRSG TEGADERM 4X4.75 (GAUZE/BANDAGES/DRESSINGS) ×3 IMPLANT
ELECT REM PT RETURN 15FT ADLT (MISCELLANEOUS) ×3 IMPLANT
GAUZE 4X4 16PLY RFD (DISPOSABLE) IMPLANT
GAUZE SPONGE 2X2 8PLY STRL LF (GAUZE/BANDAGES/DRESSINGS) IMPLANT
GLOVE BIO SURGEON STRL SZ 6.5 (GLOVE) ×3 IMPLANT
GLOVE BIOGEL M STRL SZ7.5 (GLOVE) ×6 IMPLANT
GLOVE BIOGEL PI IND STRL 7.5 (GLOVE) ×2 IMPLANT
GLOVE BIOGEL PI INDICATOR 7.5 (GLOVE) ×1
GOWN STRL REUS W/TWL LRG LVL3 (GOWN DISPOSABLE) ×9 IMPLANT
HOLDER FOLEY CATH W/STRAP (MISCELLANEOUS) ×3 IMPLANT
IRRIG SUCT STRYKERFLOW 2 WTIP (MISCELLANEOUS) ×3
IRRIGATION SUCT STRKRFLW 2 WTP (MISCELLANEOUS) ×2 IMPLANT
IV LACTATED RINGERS 1000ML (IV SOLUTION) ×3 IMPLANT
KIT PROCEDURE DA VINCI SI (MISCELLANEOUS) ×3
KIT PROCEDURE DVNC SI (MISCELLANEOUS) ×2 IMPLANT
KIT TURNOVER KIT A (KITS) IMPLANT
NDL INSUFFLATION 14GA 120MM (NEEDLE) ×2 IMPLANT
NDL SPNL 22GX7 QUINCKE BK (NEEDLE) ×2 IMPLANT
NEEDLE INSUFFLATION 14GA 120MM (NEEDLE) ×3 IMPLANT
NEEDLE SPNL 22GX7 QUINCKE BK (NEEDLE) ×3 IMPLANT
PACK ROBOT UROLOGY CUSTOM (CUSTOM PROCEDURE TRAY) ×3 IMPLANT
PAD POSITIONING PINK XL (MISCELLANEOUS) ×3 IMPLANT
PENCIL SMOKE EVACUATOR (MISCELLANEOUS) IMPLANT
PORT ACCESS TROCAR AIRSEAL 12 (TROCAR) ×2 IMPLANT
PORT ACCESS TROCAR AIRSEAL 5M (TROCAR) ×1
RELOAD STAPLE 45 4.1 GRN THCK (STAPLE) ×2 IMPLANT
SEAL CANN UNIV 5-8 DVNC XI (MISCELLANEOUS) ×8 IMPLANT
SEAL XI 5MM-8MM UNIVERSAL (MISCELLANEOUS) ×12
SET TRI-LUMEN FLTR TB AIRSEAL (TUBING) ×3 IMPLANT
SOLUTION ELECTROLUBE (MISCELLANEOUS) ×3 IMPLANT
SPONGE GAUZE 2X2 STER 10/PKG (GAUZE/BANDAGES/DRESSINGS)
SPONGE LAP 4X18 RFD (DISPOSABLE) ×3 IMPLANT
STAPLE RELOAD 45 GRN (STAPLE) ×2 IMPLANT
STAPLE RELOAD 45MM GREEN (STAPLE) ×3
SUT ETHILON 3 0 PS 1 (SUTURE) ×3 IMPLANT
SUT MNCRL AB 4-0 PS2 18 (SUTURE) ×6 IMPLANT
SUT PDS AB 1 CT1 27 (SUTURE) ×6 IMPLANT
SUT VIC AB 2-0 SH 27 (SUTURE) ×3
SUT VIC AB 2-0 SH 27X BRD (SUTURE) ×2 IMPLANT
SUT VICRYL 0 UR6 27IN ABS (SUTURE) ×3 IMPLANT
SUT VLOC BARB 180 ABS3/0GR12 (SUTURE) ×9
SUTURE VLOC BRB 180 ABS3/0GR12 (SUTURE) ×6 IMPLANT
SYR 27GX1/2 1ML LL SAFETY (SYRINGE) ×3 IMPLANT
TOWEL OR NON WOVEN STRL DISP B (DISPOSABLE) ×3 IMPLANT
TROCAR XCEL NON-BLD 5MMX100MML (ENDOMECHANICALS) IMPLANT
WATER STERILE IRR 1000ML POUR (IV SOLUTION) ×3 IMPLANT

## 2019-09-06 NOTE — Op Note (Signed)
NAME: Jay Day, MCVOY MEDICAL RECORD [id number] ACCOUNT [id number] DATE OF BIRTH:Jun 13, 1941 FACILITY: [facility] LOCATION: [facility] PHYSICIAN:Cadince Hilscher Tresa Moore, MD  OPERATIVE REPORT  DATE OF PROCEDURE:  09/06/2019  SURGEON:  Alexis Frock, MD  PREOPERATIVE DIAGNOSES:   1.  Prostate cancer. 2.  Enlarged prostate.  3.  Urinary retention.  PROCEDURES:   1.  Robotic-assisted laparoscopic radical prostatectomy. 2.  Bilateral pelvic lymphadenectomy.  ASSISTANT:  Debbrah Alar, PA-C.  ESTIMATED BLOOD LOSS:  175 mL.  SPECIMENS:   1.  Periprostatic fat. 2.  Right external iliac lymph nodes. 3.  Right obturator lymph nodes. 4.  Left external iliac lymph nodes. 5.  Left obturator lymph nodes. 6.  Prostatectomy.  FINDINGS: 1.  Loose adhesions, left lower quadrant of the sigmoid colon to the area of prior mesh hernia repair. 2.  Very large prostate with some asymmetry, bilobar, left greater than right.  INDICATIONS:  The patient is a very pleasant and quite vigorous 79 year old man with longstanding history of obstructive voiding symptoms that have worsened and culminated in frank urinary retention.  His is now catheter dependent.  He failed medical  therapy and multiple voiding trials.  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  Given his very large prostate with cancer, and it is clinically localized, options were  discussed for management, including surveillance protocols versus ablative therapies versus surgical extirpation and he wished to proceed with the latter, with goal of curative intent for his cancer and relieving his urinary retention with obstruction.   Informed consent was obtained and placed in the medical record.  DESCRIPTION OF PROCEDURE:  The patient being identified, the procedure being radical prostatectomy was confirmed.  Procedure timeout was performed.  IV antibiotics were administered.   General endotracheal anesthesia induced.  The patient was placed into  a low lithotomy position.  A sterile field was created, prepping and draping his penis, perineum and proximal thighs using iodine and his infraxiphoid abdomen utilizing gluconate.  His in situ catheter was removed and exchanged for a new 16-French Foley  catheter placed to straight drainage.  A high-flow, low-pressure pneumoperitoneum was obtained using Veress technique in the supraumbilical midline, having passed the aspiration and drop test.  An 8 mm robotic camera port was then placed in same  location.  Laparoscopic examination of the peritoneal cavity revealed some adhesions of the redundant sigmoid in the left lower quadrant, likely in an area of prior mesh hernia repair.  Distal ports were then placed as follows:  Right paramedian 8 mm  robotic port, right far lateral 12 mm AirSeal assist port, right median 5 mm robotic port, left paramedian 8 mm robotic port, left far lateral 8 mm robotic port.  Robot was docked and passed the electronic checks.  Initial attention was directed at  limited adhesiolysis.  Loose adhesions between the lateral pelvic sidewall and the sigmoid and left lower quadrant peritoneum were then carefully taken down using cautery scissors.  This did reveal an area of mesh in the left inguinal area as expected, without any  complicating features.  Attention was directed at development of the space of Retzius.  Incision was made lateral to right medial umbilical ligament from the midline towards the right internal ring, coursing along the iliac vessels towards the area of  the right ureter.  Right vas deferens was encountered, purposely ligated using medial bucket handle and the right bladder wall was swept away from the  pelvic sidewall towards the area of the endopelvic fascia, which was then swept away from the lateral  aspect of the prostate in base to apex orientation.  A mirror image dissection was performed  on the left side.  Anterior attachments were taken down using cautery scissors.  This exposed the dorsal venous complex which was controlled using a green load  stapler, taking exquisite care to avoid membranous urethral injury which did not occur.  Attention was directed at the pelvic lymphadenectomy.  First on the right side, the right external iliac group was dissected free with the boundaries being right  external iliac artery, vein, pelvic sidewall, iliac bifurcation.  Lymphostasis achieved with cold clips.  Next, the right obturator group was dissected free with the boundaries being right external iliac vein, pelvic sidewall, obturator nerve.   Lymphostasis was achieved with cold clips.  Right obturator nerve was inspected following these maneuvers and found to be uninjured.  A mirror image lymphadenectomy was performed on the left side, the left external iliac and left obturator groups  respectively.  Left obturator nerve was similarly uninjured.  Following this, attention was directed at bladder neck dissection.  Bladder was identified by moving the Foley catheter back and forth and a lateral release was performed on each side to  better demarcate the bladder neck and prostate junction.  It was immediately apparent that there was significant bilobar hypertrophy with mostly a pushing type phenomena on the bladder neck, as well as some asymmetry, left greater than right.  The  bladder neck was then carefully separated from the prostate in an anterior, posterior direction, keeping what appeared to be a rim of circular muscle fibers at each plane of dissection.  I was very satisfied and pleased with the caliber of bladder neck  considering the very large prostate.  Posterior dissection was performed by first placing a figure-of-eight stay stitch in the posterior aspect of the large intravesical protrusion area, placing this in anterior superior traction and dissecting  inferiorly for a distance of  approximately 1.5 cm and then posteriorly to avoid buttonholing of the bladder.  Bilateral vas deferens were encountered, dissected for a distance of approximately 3 cm, ligated and placed on gentle superior traction.   Bilateral seminal vesicles were dissected to their tips and placed on gentle superior traction.  Dissection proceeded within the plane Denonvilliers towards the area of the apex of the prostate.  This exposed the vascular pedicles bilaterally, which were  controlled using a sequential clipping technique in a base to apex orientation.  Final apical dissection was performed anteriorly by placing the prostate on superior traction and transecting the membranous urethra coldly.  This completely freed up the  prostatectomy specimen, which was placed into an EndoCatch bag for later retrieval.  Next, digital rectal exam was performed using indicator glove under laparoscopic vision.  No evidence of rectal violation was noted.  Attention was directed at posterior  reconstruction.  A 3-0 V-Loc suture was used to reapproximate the posterior urethral plate to the posterior bladder neck, bringing these structures into tension free apposition.  Next, mucosa-to-mucosa anastomosis was performed using a 3-0 V-Loc suture,  double armed from the 6 o'clock to the 12 o'clock position, which resulted in excellent mucosal apposition without undue tension.  A new Foley catheter was placed free to straight drain, which then irrigated quantitatively, 15 mL around the balloon.  We  achieved the goals of the extirpative portion of the procedure today.  A closed suction drain  was brought through the previous left lateral most robotic port site in the peritoneal cavity and the right lateral 12 mm AirSeal assist port site was closed  with fascia using Carter-Thomason suture passer and 0 Vicryl.  The specimen was retrieved by extending the previous camera port site superiorly for a total distance of approximately 4 cm,  removing the large prostatectomy specimen, setting it aside for  pathology.  The extraction site was then closed at the level of the fascia using figure-of-eight PDS x4, followed by reapproximation of Scarpa's with running Vicryl.  All incision sites were infiltrated with dilute lipolyzed Marcaine and closed at the  level of the skin using Dermabond.  Procedure was then terminated.  The patient tolerated the procedure well.  No immediate perioperative complications.  The patient was taken to the postanesthesia care unit in stable condition.  Please note, first assistant Debbrah Alar was crucial for all portions of the procedure today.  She provided invaluable retraction, specimen manipulation, vascular clipping, vascular stapling and general first assistance.  VN/NUANCE  D:09/06/2019 T:09/06/2019 JOB:[id number]

## 2019-09-06 NOTE — H&P (Signed)
Jay Day is an 79 y.o. male.    Chief Complaint: Pre-OP Radical Prostatectomy  HPI:   1 - Low Risk Prostate Cancer - 3 cores Grade 1 disease up to 25% all Rt lateral by BX 08/2015 on eval PSA 6.8. MRI 2019 low risk and prostate vol 189mL.   2 - Urinary Retention - develop frank retention 2021. Did not tolerate self cath. He has very large prostate.   PMH sig for CVA/PE/Coumadin, lap L inguinal hernia repair with mesh. 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 His PCP is T. Fara Olden MD.   Today "Jay Day" is seen to proceed with radical prostatectomy. He has held eliquus as instructed. He has cards clearance. C19 screen negative. Hgb 12.6.     Past Medical History:  Diagnosis Date  . Asthma    hx of as child  . Bronchitis    as a child  . Cataract   . Chronic anticoagulation 06/11/2013  . Clotting disorder ([place])   . 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 Elevated PSA    being monitored by physician  . Glaucoma 10/13/2011  . 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 Headache disorder 02/24/2015   occ  . Hypertension    sees [name], primary   . MI (myocardial infarction) ([place]) not sure when   scar tissue saw  . Peyronie's disease   . 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 Pituitary tumor    takes medication to manage  . Prostatitis   . Pulmonary embolism ([place]) yrs ago  . Rash    last 3 months chest and arms, saw md no tx given  . 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 Stroke [facility]) 2017   mild cva    Past Surgical History:  Procedure Laterality Date  . COLONOSCOPY N/A 08/22/2016   Procedure: COLONOSCOPY;  Surgeon: Garlan Fair, MD;  Location: [facility];  Service: Endoscopy;  Laterality: N/A;  . colonscopy  2007  . EYE SURGERY     bilateral cataract surgery  . HERNIA REPAIR  1960  . INGUINAL HERNIA REPAIR  08/12/2011   Procedure: LAPAROSCOPIC INGUINAL HERNIA;  Surgeon: [name], MD;  Location: [facility];  Service: General;  Laterality: Left;  . [place]   left   . LEFT HEART CATHETERIZATION WITH CORONARY ANGIOGRAM N/A 05/06/2014   Procedure: LEFT HEART CATHETERIZATION WITH CORONARY ANGIOGRAM;  Surgeon: [name], MD;  Location: [facility] CATH LAB;  Service: Cardiovascular;  Laterality: N/A;  . TONSILLECTOMY     as a child    Family History  Problem Relation Age of Onset  . Pulmonary embolism Mother   . Kidney failure Mother   . Hypertension Mother   . Kidney Stones Mother   . Pulmonary embolism Father   . Prostate cancer Father   . Pulmonary embolism Maternal Grandmother   . Pulmonary embolism Maternal Grandfather   . Pulmonary embolism Paternal Grandmother   . Pulmonary embolism Paternal Grandfather    Social History:  reports that he quit smoking about 49 years ago. His smoking use included cigarettes. He has a 1.50 pack-year smoking history. He has never used smokeless tobacco. He reports that he does not drink alcohol or use drugs.  Allergies:  Allergies  Allergen Reactions  . Gluten Meal Other (See Comments)    Gluten Sensitivity (reaction undefined) NO BREAD  . Wheat Bran Other (See Comments)    Reaction undefined    Medications Prior to Admission  Medication Sig Dispense Refill  . bromocriptine (PARLODEL) 2.5 MG tablet TAKE 1/2 TABLET BY MOUTH 5 TIMES A WEEK 10 tablet 3  . dorzolamide-timolol (COSOPT) 22.3-6.8 MG/ML ophthalmic solution Place 1 drop into both eyes 2 (two) times daily.     Marland Kitchen ELIQUIS 5 MG TABS tablet Take 5 mg by mouth 2 (two) times daily.    Marland Kitchen latanoprost (XALATAN) 0.005 % ophthalmic solution Place 1 drop into both eyes at bedtime.   0  . metoprolol succinate (TOPROL-XL) 25 MG 24 hr tablet Take 50 mg by mouth daily.     . Multiple Vitamin (MULTIVITAMIN WITH MINERALS) TABS  tablet Take 1 tablet by mouth daily.    . Probiotic Product (PROBIOTIC ADVANCED PO) Take 1 capsule by mouth daily.    . Zinc 22.5 MG TABS Take 22.5 mg by mouth daily.      No results found for this or any previous visit (from the past 48 hour(s)). No results found.  Review of Systems  Constitutional: Negative for chills and fever.  Genitourinary: Positive for difficulty urinating.  All other systems reviewed and are negative.   There were no vitals taken for this visit. Physical Exam  Constitutional: He appears well-developed.  HENT:  Head: Normocephalic.  Eyes: Pupils are equal, round, and reactive to light.  Cardiovascular: Normal rate.  Respiratory: Effort normal.  GI: Soft.  Genitourinary:    Genitourinary Comments: Catheter in situ with non-foul urine.    Musculoskeletal:     Cervical back: Normal range of motion.  Neurological: He is alert.  Skin: Skin is warm.  Psychiatric: He has a normal mood and affect.     Assessment/Plan  Proceed as planned with radical prostatectomy / limited node diessection for prostate cancer with urinary retention. Risks, benefits, expected peri-op course discused previously and reiterated today.   Alexis Frock, MD 09/06/2019, 7:02 AM

## 2019-09-06 NOTE — Anesthesia Postprocedure Evaluation (Signed)
Anesthesia Post Note  Patient: Jay Day  Procedure(s) Performed: XI ROBOTIC ASSISTED LAPAROSCOPIC RADICAL PROSTATECTOMY (N/A ) LYMPHADENECTOMY (Bilateral )     Patient location during evaluation: PACU Anesthesia Type: General Level of consciousness: awake and alert Pain management: pain level controlled Vital Signs Assessment: post-procedure vital signs reviewed and stable Respiratory status: spontaneous breathing, nonlabored ventilation, respiratory function stable and patient connected to nasal cannula oxygen Cardiovascular status: blood pressure returned to baseline and stable Postop Assessment: no apparent nausea or vomiting Anesthetic complications: no    Last Vitals:  Vitals:   09/06/19 1400 09/06/19 1454  BP: 140/78 140/80  Pulse: 70 66  Resp: 16 16  Temp: (!) 36.4 C (!) 35.9 C  SpO2: 99% 100%    Last Pain:  Vitals:   09/06/19 1400  PainSc: 0-No pain                 Effie Berkshire

## 2019-09-06 NOTE — Anesthesia Procedure Notes (Signed)
Procedure Name: Intubation Date/Time: 09/06/2019 7:37 AM Performed by: Sharlette Dense, CRNA Patient Re-evaluated:Patient Re-evaluated prior to induction Oxygen Delivery Method: Circle system utilized Preoxygenation: Pre-oxygenation with 100% oxygen Induction Type: IV induction Ventilation: Mask ventilation without difficulty and Oral airway inserted - appropriate to patient size Laryngoscope Size: Miller and 3 Grade View: Grade I Tube type: Oral Tube size: 8.0 mm Number of attempts: 1 Airway Equipment and Method: Stylet Placement Confirmation: ETT inserted through vocal cords under direct vision,  positive ETCO2 and breath sounds checked- equal and bilateral Secured at: 23 cm Tube secured with: Tape Dental Injury: Teeth and Oropharynx as per pre-operative assessment

## 2019-09-06 NOTE — Discharge Instructions (Signed)

## 2019-09-06 NOTE — Brief Op Note (Signed)
09/06/2019  10:26 AM  PATIENT:  Jay Day  79 y.o. male  PRE-OPERATIVE DIAGNOSIS:  PROSTATE CANCER, URINARY RETENTION  POST-OPERATIVE DIAGNOSIS:  PROSTATE CANCER, URINARY RETENTION  PROCEDURE:  Procedure(s) with comments: XI ROBOTIC ASSISTED LAPAROSCOPIC RADICAL PROSTATECTOMY (N/A) - 3 HRS LYMPHADENECTOMY (Bilateral)  SURGEON:  Surgeon(s) and Role:    Alexis Frock, MD - Primary  PHYSICIAN ASSISTANT:   ASSISTANTS: Clemetine Marker PA   ANESTHESIA:   local and general  EBL:  350 mL   BLOOD ADMINISTERED:none  DRAINS: JP to bulb; Foley to gravity   LOCAL MEDICATIONS USED:  MARCAINE     SPECIMEN:  Source of Specimen:  1 - periprostatic fat; 2 - pelvic lymph nodes; 3 - prostatecotmy  DISPOSITION OF SPECIMEN:  PATHOLOGY  COUNTS:  YES  TOURNIQUET:  * No tourniquets in log *  DICTATION: .Other Dictation: Dictation Number  [id number]  PLAN OF CARE: Admit for overnight observation  PATIENT DISPOSITION:  PACU - hemodynamically stable.   Delay start of Pharmacological VTE agent (>24hrs) due to surgical blood loss or risk of bleeding: yes

## 2019-09-06 NOTE — Transfer of Care (Signed)
Immediate Anesthesia Transfer of Care Note  Patient: Jay Day  Procedure(s) Performed: XI ROBOTIC ASSISTED LAPAROSCOPIC RADICAL PROSTATECTOMY (N/A ) LYMPHADENECTOMY (Bilateral )  Patient Location: PACU  Anesthesia Type:General  Level of Consciousness: awake and oriented  Airway & Oxygen Therapy: Patient Spontanous Breathing and Patient connected to face mask oxygen  Post-op Assessment: Report given to RN and Post -op Vital signs reviewed and stable  Post vital signs: Reviewed and stable  Last Vitals:  Vitals Value Taken Time  BP 182/102 09/06/19 1048  Temp    Pulse 76 09/06/19 1050  Resp 8 09/06/19 1050  SpO2 100 % 09/06/19 1050  Vitals shown include unvalidated device data.  Last Pain: There were no vitals filed for this visit.       Complications: No apparent anesthesia complications

## 2019-09-06 NOTE — Anesthesia Preprocedure Evaluation (Addendum)
Anesthesia Evaluation  Patient identified by MRN, date of birth, ID band Patient awake    Reviewed: Allergy & Precautions, NPO status , Patient's Chart, lab work & pertinent test results  Airway Mallampati: I  TM Distance: >3 FB Neck ROM: Full    Dental  (+) Teeth Intact, Dental Advisory Given   Pulmonary asthma , former smoker,    breath sounds clear to auscultation       Cardiovascular hypertension, Pt. on home beta blockers + CAD   Rhythm:Regular Rate:Normal     Neuro/Psych  Headaches, CVA negative psych ROS   GI/Hepatic negative GI ROS, Neg liver ROS,   Endo/Other  negative endocrine ROS  Renal/GU Renal InsufficiencyRenal disease     Musculoskeletal negative musculoskeletal ROS (+)   Abdominal Normal abdominal exam  (+)   Peds  Hematology negative hematology ROS (+)   Anesthesia Other Findings   Reproductive/Obstetrics                            Anesthesia Physical Anesthesia Plan  ASA: III  Anesthesia Plan: General   Post-op Pain Management:    Induction: Intravenous  PONV Risk Score and Plan: 3 and Ondansetron, Dexamethasone and Treatment may vary due to age or medical condition  Airway Management Planned: Oral ETT  Additional Equipment: None  Intra-op Plan:   Post-operative Plan: Extubation in OR  Informed Consent: I have reviewed the patients History and Physical, chart, labs and discussed the procedure including the risks, benefits and alternatives for the proposed anesthesia with the patient or authorized representative who has indicated his/her understanding and acceptance.     Dental advisory given  Plan Discussed with: CRNA  Anesthesia Plan Comments:        Anesthesia Quick Evaluation

## 2019-09-07 ENCOUNTER — Other Ambulatory Visit: Payer: Self-pay

## 2019-09-07 DIAGNOSIS — Z86711 Personal history of pulmonary embolism: Secondary | ICD-10-CM | POA: Diagnosis not present

## 2019-09-07 DIAGNOSIS — Z20822 Contact with and (suspected) exposure to covid-19: Secondary | ICD-10-CM | POA: Diagnosis not present

## 2019-09-07 DIAGNOSIS — I10 Essential (primary) hypertension: Secondary | ICD-10-CM | POA: Diagnosis not present

## 2019-09-07 DIAGNOSIS — I252 Old myocardial infarction: Secondary | ICD-10-CM | POA: Diagnosis not present

## 2019-09-07 DIAGNOSIS — C61 Malignant neoplasm of prostate: Secondary | ICD-10-CM | POA: Diagnosis not present

## 2019-09-07 DIAGNOSIS — Z86718 Personal history of other venous thrombosis and embolism: Secondary | ICD-10-CM | POA: Diagnosis not present

## 2019-09-07 LAB — BASIC METABOLIC PANEL
Anion gap: 7 (ref 5–15)
BUN: 19 mg/dL (ref 8–23)
CO2: 23 mmol/L (ref 22–32)
Calcium: 8.5 mg/dL — ABNORMAL LOW (ref 8.9–10.3)
Chloride: 108 mmol/L (ref 98–111)
Creatinine, Ser: 1.18 mg/dL (ref 0.61–1.24)
GFR calc Af Amer: >60 mL/min (ref >60)
GFR calc non Af Amer: 59 mL/min — ABNORMAL LOW (ref >60)
Glucose, Bld: 172 mg/dL — ABNORMAL HIGH (ref 70–99)
Potassium: 4.1 mmol/L (ref 3.5–5.1)
Sodium: 138 mmol/L (ref 135–145)

## 2019-09-07 LAB — HEMOGLOBIN AND HEMATOCRIT, BLOOD
HCT: 34.7 % — ABNORMAL LOW (ref 39.0–52.0)
Hemoglobin: 11.7 g/dL — ABNORMAL LOW (ref 13.0–17.0)

## 2019-09-07 NOTE — Discharge Summary (Signed)
Physician Discharge Summary  Patient ID: Jay Day MRN: [id number] DOB/AGE: 13-Aug-1940 79 y.o.  Admit date: 09/06/2019 Discharge date: 09/07/2019  Admission Diagnoses:  Discharge Diagnoses:  Active Problems:   Prostate cancer [facility])   Discharged Condition: good  Hospital Course: Patient underwent a robotic assisted laparoscopic radical prostatectomy on 09/06/2019.  He tolerated procedure well was stable postoperatively.  The following day, he was doing very well, tolerating a diet, ambulating, minimal pain and was ready for discharge.  He was discharged in stable condition  Consults: None  Significant Diagnostic Studies: None  Treatments: surgery: As above  Discharge Exam: Blood pressure 119/74, pulse 61, temperature 97.6 F (36.4 C), resp. rate 18, SpO2 97 %. General appearance: alert no acute distress Adequate perfusion of extremities Nonlabored respiration Abdomen soft, appropriately tender, nondistended Foley catheter draining clear yellow urine JP serosanguineous  Disposition: Discharge disposition: 01-Home or Self Care        Allergies as of 09/07/2019      Reactions   Gluten Meal Other (See Comments)   Gluten Sensitivity (reaction undefined) NO BREAD   Wheat Bran Other (See Comments)   Reaction undefined      Medication List    STOP taking these medications   multivitamin with minerals Tabs tablet   PROBIOTIC ADVANCED PO   Zinc 22.5 MG Tabs     TAKE these medications   bromocriptine 2.5 MG tablet Commonly known as: PARLODEL TAKE 1/2 TABLET BY MOUTH 5 TIMES A WEEK   dorzolamide-timolol 22.3-6.8 MG/ML ophthalmic solution Commonly known as: COSOPT Place 1 drop into both eyes 2 (two) times daily.   Eliquis 5 MG Tabs tablet Generic drug: apixaban Take 5 mg by mouth 2 (two) times daily.   HYDROcodone-acetaminophen 5-325 MG tablet Commonly known as: Norco Take 1-2 tablets by mouth every 6 (six) hours as needed for moderate pain.    latanoprost 0.005 % ophthalmic solution Commonly known as: XALATAN Place 1 drop into both eyes at bedtime.   metoprolol succinate 25 MG 24 hr tablet Commonly known as: TOPROL-XL Take 50 mg by mouth daily.   sulfamethoxazole-trimethoprim 800-160 MG tablet Commonly known as: BACTRIM DS Take 1 tablet by mouth 2 (two) times daily. Start the day prior to foley removal appointment      Follow-up Information    Alexis Frock, MD On 09/16/2019.   Specialty: Urology Why: at 10:45 for MD visit and catheter removal.  Contact information: [place] [id number] [phone]           Signed: Marton Redwood, III 09/07/2019, 11:17 AM

## 2019-09-07 NOTE — Plan of Care (Signed)

## 2019-09-07 NOTE — Progress Notes (Signed)
Pt discharged from the unit via wheelchair. Discharge instructions were reviewed with the pt prior to discharge. Foley teaching and educations was provided to pt. Air pod were found in the room, pt notified. No questions or concerns at this time.

## 2019-09-10 LAB — SURGICAL PATHOLOGY

## 2019-09-11 DIAGNOSIS — I2510 Atherosclerotic heart disease of native coronary artery without angina pectoris: Secondary | ICD-10-CM | POA: Diagnosis not present

## 2019-09-11 DIAGNOSIS — I10 Essential (primary) hypertension: Secondary | ICD-10-CM | POA: Diagnosis not present

## 2019-09-11 DIAGNOSIS — C61 Malignant neoplasm of prostate: Secondary | ICD-10-CM | POA: Diagnosis not present

## 2019-09-25 DIAGNOSIS — I2510 Atherosclerotic heart disease of native coronary artery without angina pectoris: Secondary | ICD-10-CM | POA: Diagnosis not present

## 2019-09-25 DIAGNOSIS — H6123 Impacted cerumen, bilateral: Secondary | ICD-10-CM | POA: Diagnosis not present

## 2019-09-25 DIAGNOSIS — I10 Essential (primary) hypertension: Secondary | ICD-10-CM | POA: Diagnosis not present

## 2019-09-25 DIAGNOSIS — C61 Malignant neoplasm of prostate: Secondary | ICD-10-CM | POA: Diagnosis not present

## 2019-10-01 DIAGNOSIS — H401131 Primary open-angle glaucoma, bilateral, mild stage: Secondary | ICD-10-CM | POA: Diagnosis not present

## 2019-10-01 DIAGNOSIS — Z961 Presence of intraocular lens: Secondary | ICD-10-CM | POA: Diagnosis not present

## 2019-10-04 DIAGNOSIS — M6281 Muscle weakness (generalized): Secondary | ICD-10-CM | POA: Diagnosis not present

## 2019-10-04 DIAGNOSIS — N393 Stress incontinence (female) (male): Secondary | ICD-10-CM | POA: Diagnosis not present

## 2019-10-04 DIAGNOSIS — M62838 Other muscle spasm: Secondary | ICD-10-CM | POA: Diagnosis not present

## 2019-10-10 DIAGNOSIS — M62838 Other muscle spasm: Secondary | ICD-10-CM | POA: Diagnosis not present

## 2019-10-10 DIAGNOSIS — M6281 Muscle weakness (generalized): Secondary | ICD-10-CM | POA: Diagnosis not present

## 2019-10-10 DIAGNOSIS — N393 Stress incontinence (female) (male): Secondary | ICD-10-CM | POA: Diagnosis not present

## 2019-10-23 DIAGNOSIS — M6281 Muscle weakness (generalized): Secondary | ICD-10-CM | POA: Diagnosis not present

## 2019-10-23 DIAGNOSIS — N393 Stress incontinence (female) (male): Secondary | ICD-10-CM | POA: Diagnosis not present

## 2019-10-23 DIAGNOSIS — M62838 Other muscle spasm: Secondary | ICD-10-CM | POA: Diagnosis not present

## 2019-10-28 DIAGNOSIS — N139 Obstructive and reflux uropathy, unspecified: Secondary | ICD-10-CM | POA: Diagnosis not present

## 2019-10-28 DIAGNOSIS — N179 Acute kidney failure, unspecified: Secondary | ICD-10-CM | POA: Diagnosis not present

## 2019-10-28 DIAGNOSIS — Z8546 Personal history of malignant neoplasm of prostate: Secondary | ICD-10-CM | POA: Diagnosis not present

## 2019-10-28 DIAGNOSIS — I129 Hypertensive chronic kidney disease with stage 1 through stage 4 chronic kidney disease, or unspecified chronic kidney disease: Secondary | ICD-10-CM | POA: Diagnosis not present

## 2019-11-05 DIAGNOSIS — I6930 Unspecified sequelae of cerebral infarction: Secondary | ICD-10-CM | POA: Diagnosis not present

## 2019-11-05 DIAGNOSIS — I10 Essential (primary) hypertension: Secondary | ICD-10-CM | POA: Diagnosis not present

## 2019-11-05 DIAGNOSIS — Z8673 Personal history of transient ischemic attack (TIA), and cerebral infarction without residual deficits: Secondary | ICD-10-CM | POA: Diagnosis not present

## 2019-11-05 DIAGNOSIS — N1831 Chronic kidney disease, stage 3a: Secondary | ICD-10-CM | POA: Diagnosis not present

## 2019-11-05 DIAGNOSIS — H538 Other visual disturbances: Secondary | ICD-10-CM | POA: Diagnosis not present

## 2019-11-12 DIAGNOSIS — N393 Stress incontinence (female) (male): Secondary | ICD-10-CM | POA: Diagnosis not present

## 2019-11-12 DIAGNOSIS — M6281 Muscle weakness (generalized): Secondary | ICD-10-CM | POA: Diagnosis not present

## 2019-11-12 DIAGNOSIS — M62838 Other muscle spasm: Secondary | ICD-10-CM | POA: Diagnosis not present

## 2019-12-02 ENCOUNTER — Encounter: Payer: Self-pay | Admitting: *Deleted

## 2019-12-04 ENCOUNTER — Emergency Department (HOSPITAL_COMMUNITY)
Admission: EM | Admit: 2019-12-04 | Discharge: 2019-12-04 | Disposition: A | Discharge status: Home / Self Care | Payer: Medicare Other | Attending: Emergency Medicine | Admitting: Emergency Medicine

## 2019-12-04 ENCOUNTER — Encounter: Payer: Self-pay | Admitting: *Deleted

## 2019-12-04 ENCOUNTER — Emergency Department (HOSPITAL_COMMUNITY): Payer: Medicare Other

## 2019-12-04 ENCOUNTER — Encounter (HOSPITAL_COMMUNITY): Payer: Self-pay | Admitting: *Deleted

## 2019-12-04 DIAGNOSIS — Z23 Encounter for immunization: Secondary | ICD-10-CM | POA: Diagnosis not present

## 2019-12-04 DIAGNOSIS — W1812XA Fall from or off toilet with subsequent striking against object, initial encounter: Secondary | ICD-10-CM | POA: Insufficient documentation

## 2019-12-04 DIAGNOSIS — S01111A Laceration without foreign body of right eyelid and periocular area, initial encounter: Secondary | ICD-10-CM | POA: Insufficient documentation

## 2019-12-04 DIAGNOSIS — Z8546 Personal history of malignant neoplasm of prostate: Secondary | ICD-10-CM | POA: Insufficient documentation

## 2019-12-04 DIAGNOSIS — Y999 Unspecified external cause status: Secondary | ICD-10-CM | POA: Diagnosis not present

## 2019-12-04 DIAGNOSIS — Y92002 Bathroom of unspecified non-institutional (private) residence single-family (private) house as the place of occurrence of the external cause: Secondary | ICD-10-CM | POA: Insufficient documentation

## 2019-12-04 DIAGNOSIS — Y9389 Activity, other specified: Secondary | ICD-10-CM | POA: Insufficient documentation

## 2019-12-04 DIAGNOSIS — Z8673 Personal history of transient ischemic attack (TIA), and cerebral infarction without residual deficits: Secondary | ICD-10-CM | POA: Insufficient documentation

## 2019-12-04 DIAGNOSIS — I2510 Atherosclerotic heart disease of native coronary artery without angina pectoris: Secondary | ICD-10-CM | POA: Diagnosis not present

## 2019-12-04 DIAGNOSIS — S0990XA Unspecified injury of head, initial encounter: Secondary | ICD-10-CM

## 2019-12-04 DIAGNOSIS — R55 Syncope and collapse: Secondary | ICD-10-CM

## 2019-12-04 DIAGNOSIS — S0181XA Laceration without foreign body of other part of head, initial encounter: Secondary | ICD-10-CM

## 2019-12-04 HISTORY — DX: Acute embolism and thrombosis of unspecified deep veins of unspecified lower extremity: I82.409

## 2019-12-04 LAB — CBC WITH DIFFERENTIAL/PLATELET
Abs Immature Granulocytes: 0.01 10*3/uL (ref 0.00–0.07)
Basophils Absolute: 0.1 10*3/uL (ref 0.0–0.1)
Basophils Relative: 1 %
Eosinophils Absolute: 0.4 10*3/uL (ref 0.0–0.5)
Eosinophils Relative: 6 %
HCT: 40.6 % (ref 39.0–52.0)
Hemoglobin: 13.7 g/dL (ref 13.0–17.0)
Immature Granulocytes: 0 %
Lymphocytes Relative: 28 %
Lymphs Abs: 1.7 10*3/uL (ref 0.7–4.0)
MCH: 34.1 pg — ABNORMAL HIGH (ref 26.0–34.0)
MCHC: 33.7 g/dL (ref 30.0–36.0)
MCV: 101 fL — ABNORMAL HIGH (ref 80.0–100.0)
Monocytes Absolute: 0.7 10*3/uL (ref 0.1–1.0)
Monocytes Relative: 11 %
Neutro Abs: 3.3 10*3/uL (ref 1.7–7.7)
Neutrophils Relative %: 54 %
Platelets: 142 10*3/uL — ABNORMAL LOW (ref 150–400)
RBC: 4.02 MIL/uL — ABNORMAL LOW (ref 4.22–5.81)
RDW: 11.9 % (ref 11.5–15.5)
WBC: 6.2 10*3/uL (ref 4.0–10.5)
nRBC: 0 % (ref 0.0–0.2)

## 2019-12-04 LAB — COMPREHENSIVE METABOLIC PANEL
ALT: 22 U/L (ref 0–44)
AST: 31 U/L (ref 15–41)
Albumin: 3.3 g/dL — ABNORMAL LOW (ref 3.5–5.0)
Alkaline Phosphatase: 62 U/L (ref 38–126)
Anion gap: 9 (ref 5–15)
BUN: 18 mg/dL (ref 8–23)
CO2: 24 mmol/L (ref 22–32)
Calcium: 8.9 mg/dL (ref 8.9–10.3)
Chloride: 113 mmol/L — ABNORMAL HIGH (ref 98–111)
Creatinine, Ser: 1.48 mg/dL — ABNORMAL HIGH (ref 0.61–1.24)
GFR calc Af Amer: 52 mL/min — ABNORMAL LOW (ref >60)
GFR calc non Af Amer: 45 mL/min — ABNORMAL LOW (ref >60)
Glucose, Bld: 110 mg/dL — ABNORMAL HIGH (ref 70–99)
Potassium: 4 mmol/L (ref 3.5–5.1)
Sodium: 146 mmol/L — ABNORMAL HIGH (ref 135–145)
Total Bilirubin: 0.7 mg/dL (ref 0.3–1.2)
Total Protein: 6 g/dL — ABNORMAL LOW (ref 6.5–8.1)

## 2019-12-04 MED ORDER — LIDOCAINE-EPINEPHRINE (PF) 2 %-1:200000 IJ SOLN
10.0000 mL | Freq: Once | INTRAMUSCULAR | Status: AC
  Administered 2019-12-04: 10 mL

## 2019-12-04 MED ORDER — TETANUS-DIPHTH-ACELL PERTUSSIS 5-2.5-18.5 LF-MCG/0.5 IM SUSP
0.5000 mL | Freq: Once | INTRAMUSCULAR | Status: AC
  Administered 2019-12-04: 0.5 mL via INTRAMUSCULAR
  Filled 2019-12-04: qty 0.5

## 2019-12-04 NOTE — Progress Notes (Signed)
Patient not available -staff working with patient.    12/04/19 0700  Clinical Encounter Type  Visited With Other (Comment)  Visit Type ED;Trauma  Referral From Care management  Consult/Referral To [facility]

## 2019-12-04 NOTE — ED Triage Notes (Signed)
Pt arrives via [facility] from home where he had a fall off of the toilet this am. Pt last remembers sitting on toilet, unsure why he fell. Wife found pt on the floor. Had some nausea initially upon EMS arrival. Pt is on eloquis currently. Wound present to R eyebrow, bleeding controlled.  C/o headache. Pt a/ox4, resp e/u, nad.

## 2019-12-04 NOTE — ED Provider Notes (Signed)
[name] DEPARTMENT Provider Note   CSN: [id number] Arrival date & time: 12/04/19  0703     History Chief Complaint  Patient presents with  . Fall    Jay Day is a 79 y.o. male.  79 year old male with past medical history including CAD, CVA, DVT/PE on Eliquis, prostate cancer who presents with head injury.  This morning, patient woke up feeling nauseated and like he had to go to the bathroom which is unusual for him.  He walked to the bathroom and sat on the toilet.  While sitting on the toilet, he began feeling more nauseated and then he briefly passed out, falling forward and striking his forehead on a rail supporting the sink.  He reports any complaints of pain, no vision changes, vomiting, or focal numbness/weakness.  Unknown last tetanus vaccination.  Reports he was in his usual state of health yesterday.  The history is provided by the patient.  Fall       Past Medical History:  Diagnosis Date  . DVT (deep venous thrombosis) ([place])   . Pulmonary embolism ([place])   . Stroke [facility]) 2016    There are no problems to display for this patient.   Past Surgical History:  Procedure Laterality Date  . HERNIA REPAIR       PMH: CAD CVA DVT/PE Prostate CA   No family history on file.  Social History   Tobacco Use  . Smoking status: Former Smoker    Quit date: 1968    Years since quitting: 53.5  . Smokeless tobacco: Never Used  Vaping Use  . Vaping Use: Never used  Substance Use Topics  . Alcohol use: Never  . Drug use: Never    Home Medications Prior to Admission medications   Not on File    Allergies    Patient has no known allergies.  Review of Systems   Review of Systems All other systems reviewed and are negative except that which was mentioned in HPI  Physical Exam Updated Vital Signs BP 120/82 (BP Location: Right Arm)   Pulse (!) 48   Temp 97.6 F (36.4 C) (Oral)   Resp 15   Ht 6' (1.829 m)   Wt 83.9  kg   SpO2 98%   BMI 25.09 kg/m   Physical Exam Vitals and nursing note reviewed.  Constitutional:      General: He is not in acute distress.    Appearance: He is well-developed.  HENT:     Head: Normocephalic.     Comments: Hematoma R lateral eyebrow with some bruising on R upper eyelid, 0.5cm laceration lateral upper eyelid Eyes:     Extraocular Movements: Extraocular movements intact.     Conjunctiva/sclera: Conjunctivae normal.     Pupils: Pupils are equal, round, and reactive to light.  Cardiovascular:     Rate and Rhythm: Normal rate and regular rhythm.     Heart sounds: Normal heart sounds. No murmur heard.   Pulmonary:     Effort: Pulmonary effort is normal.     Breath sounds: Normal breath sounds.  Abdominal:     General: Bowel sounds are normal. There is no distension.     Palpations: Abdomen is soft.     Tenderness: There is no abdominal tenderness.  Musculoskeletal:     Cervical back: Neck supple. No tenderness.  Skin:    General: Skin is warm and dry.  Neurological:     General: No focal deficit present.  Mental Status: He is alert and oriented to person, place, and time.     Cranial Nerves: No cranial nerve deficit.     Sensory: No sensory deficit.     Motor: No weakness.     Coordination: Coordination normal.     Comments: Fluent speech  Psychiatric:        Judgment: Judgment normal.     ED Results / Procedures / Treatments   Labs (all labs ordered are listed, but only abnormal results are displayed) Labs Reviewed  COMPREHENSIVE METABOLIC PANEL - Abnormal; Notable for the following components:      Result Value   Sodium 146 (*)    Chloride 113 (*)    Glucose, Bld 110 (*)    Creatinine, Ser 1.48 (*)    Total Protein 6.0 (*)    Albumin 3.3 (*)    GFR calc non Af Amer 45 (*)    GFR calc Af Amer 52 (*)    All other components within normal limits  CBC WITH DIFFERENTIAL/PLATELET - Abnormal; Notable for the following components:   RBC 4.02 (*)      MCV 101.0 (*)    MCH 34.1 (*)    Platelets 142 (*)    All other components within normal limits    EKG EKG Interpretation  Date/Time:  Wednesday December 04 2019 07:42:24 EDT Ventricular Rate:  47 PR Interval:    QRS Duration: 95 QT Interval:  472 QTC Calculation: 418 R Axis:   71 Text Interpretation: Sinus bradycardia Ventricular tachycardia, unsustained Borderline ST elevation, anterolateral leads No previous ECGs available Confirmed by Theotis Burrow [id number]) on 12/04/2019 8:05:23 AM   Radiology CT Head Wo Contrast  Result Date: 12/04/2019 CLINICAL DATA:  Head trauma, syncope, fall, on blood thinners, laceration to left forehead EXAM: CT HEAD WITHOUT CONTRAST CT CERVICAL SPINE WITHOUT CONTRAST TECHNIQUE: Multidetector CT imaging of the head and cervical spine was performed following the standard protocol without intravenous contrast. Multiplanar CT image reconstructions of the cervical spine were also generated. COMPARISON:  MR brain, 08/02/2019 FINDINGS: CT HEAD FINDINGS Brain: No evidence of acute infarction, hemorrhage, hydrocephalus, extra-axial collection or mass lesion/mass effect. Periventricular white matter hypodensity. Nonacute infarctions of the left corona radiata (series 1, image 18) and right corona radiata (series 1, image 19). Vascular: No hyperdense vessel or unexpected calcification. Skull: Normal. Negative for fracture or focal lesion. Sinuses/Orbits: No acute finding. Other: Soft tissue laceration of the right forehead. CT CERVICAL SPINE FINDINGS Alignment: Normal. Skull base and vertebrae: No acute fracture. No primary bone lesion or focal pathologic process. Soft tissues and spinal canal: No prevertebral fluid or swelling. No visible canal hematoma. Disc levels: Mild to moderate multilevel disc space height loss and osteophytosis. Upper chest: Negative. Other: None. IMPRESSION: 1. No acute intracranial pathology. 2. 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 3. Soft tissue laceration of the right forehead. 4. No fracture or static subluxation of the cervical spine. 5. Mild to moderate multilevel disc space height loss and osteophytosis. Electronically Signed   By: Eddie Candle M.D.   On: 12/04/2019 08:35   CT Cervical Spine Wo Contrast  Result Date: 12/04/2019 CLINICAL DATA:  Head trauma, syncope, fall, on blood thinners, laceration to left forehead EXAM: CT HEAD WITHOUT CONTRAST CT CERVICAL SPINE WITHOUT CONTRAST TECHNIQUE: Multidetector CT imaging of the head and cervical spine was performed following the standard protocol without intravenous contrast. Multiplanar CT image reconstructions of the cervical spine were also generated. COMPARISON:  MR brain, 08/02/2019 FINDINGS: CT HEAD FINDINGS Brain: No evidence of acute infarction, hemorrhage, hydrocephalus, extra-axial collection or mass lesion/mass effect. Periventricular white matter hypodensity. Nonacute infarctions of the left corona radiata (series 1, image 18) and right corona radiata (series 1, image 19). Vascular: No hyperdense vessel or unexpected calcification. Skull: Normal. Negative for fracture or focal lesion. Sinuses/Orbits: No acute finding. Other: Soft tissue laceration of the right forehead. CT CERVICAL SPINE FINDINGS Alignment: Normal. Skull base and vertebrae: No acute fracture. No primary bone lesion or focal pathologic process. Soft tissues and spinal canal: No prevertebral fluid or swelling. No visible canal hematoma. Disc levels: Mild to moderate multilevel disc space height loss and osteophytosis. Upper chest: Negative. Other: None. IMPRESSION: 1. No acute intracranial pathology. 2. Small-vessel white matter disease and nonacute infarctions of the corona radiata. 3. Soft tissue laceration of the right forehead. 4. No fracture or static subluxation of the cervical spine. 5. Mild to moderate multilevel disc space height loss and osteophytosis. Electronically  Signed   By: Eddie Candle M.D.   On: 12/04/2019 08:35    Procedures .Marland KitchenLaceration Repair  Date/Time: 12/04/2019 10:16 AM Performed by: Sharlett Iles, MD Authorized by: Sharlett Iles, MD   Consent:    Consent obtained:  Verbal   Consent given by:  Patient   Risks discussed:  Infection, pain, poor cosmetic result and poor wound healing   Alternatives discussed:  No treatment Anesthesia (see MAR for exact dosages):    Anesthesia method:  Local infiltration   Local anesthetic:  Lidocaine 2% WITH epi Laceration details:    Location:  Face   Face location:  R upper eyelid   Length (cm):  0.5 Repair type:    Repair type:  Simple Pre-procedure details:    Preparation:  Patient was prepped and draped in usual sterile fashion Treatment:    Area cleansed with:  Betadine   Amount of cleaning:  Standard   Irrigation solution:  Sterile saline   Irrigation method:  Syringe Skin repair:    Repair method:  Sutures   Suture size:  5-0   Suture material:  Fast-absorbing gut   Suture technique:  Simple interrupted   Number of sutures:  3 Approximation:    Approximation:  Close Post-procedure details:    Dressing:  Open (no dressing)   Patient tolerance of procedure:  Tolerated well, no immediate complications   (including critical care time)  Medications Ordered in ED Medications  lidocaine-EPINEPHrine (XYLOCAINE W/EPI) 2 %-1:200000 (PF) injection 10 mL (has no administration in time range)  Tdap (BOOSTRIX) injection 0.5 mL (0.5 mLs Intramuscular Given 12/04/19 0745)    ED Course  I have reviewed the triage vital signs and the nursing notes.  Pertinent labs & imaging results that were available during my care of the patient were reviewed by me and considered in my medical decision making (see chart for details).    MDM Rules/Calculators/A&P                          Well appearing, alert on exam, EKG shows sinus bradycardia, similar to EKG on his other chart.   Screening lab work is reassuring, creatinine 1.48 which is similar to previous values on other chart.  CBC unremarkable.  CT head and C-spine negative for acute injury.  Wound repaired at bedside, see procedure note for details.  Discussed wound care and return precautions regarding any signs of infection.  Regarding his head injury,  reviewed reasons to return to the ED particularly given his Eliquis use.  Regarding his syncopal episode, given that he was on the toilet, suspect possible vasovagal syncope.  He follows with cardiology, Dr. Marlou Porch, and chart review shows clean CTA H&N a few years ago and ECHO earlier this year. I recommended close f/u in cardiology clinic. Return precautions reviewed. Final Clinical Impression(s) / ED Diagnoses Final diagnoses:  Facial laceration, initial encounter  Minor head injury, initial encounter  Syncope, unspecified syncope type    Rx / DC Orders ED Discharge Orders    None       Shannyn Jankowiak, Wenda Overland, MD 12/04/19 1019

## 2019-12-04 NOTE — Progress Notes (Signed)
Orthopedic Tech Progress Note Patient Details:  Jay Day 02-25-1941 [id number] Level 2 Trauma. Not needed Patient ID: Jay Day, male   DOB: Jul 01, 1940, 79 y.o.   MRN: [id number]   [name] 12/04/2019, 7:46 AM

## 2019-12-06 DIAGNOSIS — R55 Syncope and collapse: Secondary | ICD-10-CM | POA: Diagnosis not present

## 2019-12-09 DIAGNOSIS — C61 Malignant neoplasm of prostate: Secondary | ICD-10-CM | POA: Diagnosis not present

## 2019-12-12 ENCOUNTER — Ambulatory Visit: Payer: Medicare Other | Admitting: Physician Assistant

## 2019-12-19 DIAGNOSIS — C61 Malignant neoplasm of prostate: Secondary | ICD-10-CM | POA: Diagnosis not present

## 2019-12-19 DIAGNOSIS — I2510 Atherosclerotic heart disease of native coronary artery without angina pectoris: Secondary | ICD-10-CM | POA: Diagnosis not present

## 2019-12-19 DIAGNOSIS — I10 Essential (primary) hypertension: Secondary | ICD-10-CM | POA: Diagnosis not present

## 2019-12-31 DIAGNOSIS — M6281 Muscle weakness (generalized): Secondary | ICD-10-CM | POA: Diagnosis not present

## 2019-12-31 DIAGNOSIS — M62838 Other muscle spasm: Secondary | ICD-10-CM | POA: Diagnosis not present

## 2019-12-31 DIAGNOSIS — N393 Stress incontinence (female) (male): Secondary | ICD-10-CM | POA: Diagnosis not present

## 2020-01-06 DIAGNOSIS — R55 Syncope and collapse: Secondary | ICD-10-CM | POA: Diagnosis not present

## 2020-01-13 ENCOUNTER — Other Ambulatory Visit: Payer: Self-pay | Admitting: Endocrinology

## 2020-01-13 ENCOUNTER — Other Ambulatory Visit: Payer: Self-pay | Admitting: *Deleted

## 2020-01-13 ENCOUNTER — Telehealth: Payer: Self-pay | Admitting: Endocrinology

## 2020-01-13 MED ORDER — BROMOCRIPTINE MESYLATE 2.5 MG PO TABS: ORAL_TABLET | ORAL | 2 refills | Status: DC

## 2020-01-13 NOTE — Telephone Encounter (Signed)
Medication Refill Request  Did you call your pharmacy and request this refill first? Yes  . If patient has not contacted pharmacy first, instruct them to do so for future refills.  . Remind them that contacting the pharmacy for their refill is the quickest method to get the refill.  . Refill policy also stated that it will take anywhere between 24-72 hours to receive the refill.    Name of medication? bromocriptine  Is this a 90 day supply? yes  Name and location of pharmacy?  [facility] [place] [address] [place]  [address] [place] [address]  Phone:  [phone] Fax:  [phone]

## 2020-01-30 DIAGNOSIS — R102 Pelvic and perineal pain: Secondary | ICD-10-CM | POA: Diagnosis not present

## 2020-01-30 DIAGNOSIS — M62838 Other muscle spasm: Secondary | ICD-10-CM | POA: Diagnosis not present

## 2020-01-30 DIAGNOSIS — N393 Stress incontinence (female) (male): Secondary | ICD-10-CM | POA: Diagnosis not present

## 2020-01-30 DIAGNOSIS — M6281 Muscle weakness (generalized): Secondary | ICD-10-CM | POA: Diagnosis not present

## 2020-01-30 DIAGNOSIS — H401131 Primary open-angle glaucoma, bilateral, mild stage: Secondary | ICD-10-CM | POA: Diagnosis not present

## 2020-02-06 ENCOUNTER — Ambulatory Visit (INDEPENDENT_AMBULATORY_CARE_PROVIDER_SITE_OTHER): Payer: Medicare Other | Admitting: Cardiology

## 2020-02-06 ENCOUNTER — Encounter: Payer: Self-pay | Admitting: Cardiology

## 2020-02-06 ENCOUNTER — Other Ambulatory Visit: Payer: Self-pay

## 2020-02-06 VITALS — BP 114/70 | HR 52 | Ht 72.0 in | Wt 191.0 lb

## 2020-02-06 DIAGNOSIS — I10 Essential (primary) hypertension: Secondary | ICD-10-CM | POA: Diagnosis not present

## 2020-02-06 DIAGNOSIS — C61 Malignant neoplasm of prostate: Secondary | ICD-10-CM | POA: Diagnosis not present

## 2020-02-06 DIAGNOSIS — I428 Other cardiomyopathies: Secondary | ICD-10-CM

## 2020-02-06 DIAGNOSIS — I700 Atherosclerosis of aorta: Secondary | ICD-10-CM

## 2020-02-06 DIAGNOSIS — Z79899 Other long term (current) drug therapy: Secondary | ICD-10-CM | POA: Diagnosis not present

## 2020-02-06 DIAGNOSIS — I2510 Atherosclerotic heart disease of native coronary artery without angina pectoris: Secondary | ICD-10-CM

## 2020-02-06 MED ORDER — METOPROLOL SUCCINATE ER 25 MG PO TB24: 12.5000 mg | ORAL_TABLET | Freq: Every day | ORAL | 3 refills | Status: DC

## 2020-02-06 MED ORDER — ROSUVASTATIN CALCIUM 10 MG PO TABS: 10.0000 mg | ORAL_TABLET | Freq: Every day | ORAL | 3 refills | Status: DC

## 2020-02-06 NOTE — Patient Instructions (Signed)
Medication Instructions:  Please decrease Metoprolol Succinate to 25 mg 1/2 tablet daily.  Start Crestor 10 mg daily.  Continue all other medications as listed.  *If you need a refill on your cardiac medications before your next appointment, please call your pharmacy*  Lab Work: Please have lipid and ALT in 3 months.  If you have labs (blood work) drawn today and your tests are completely normal, you will receive your results only by: Marland Kitchen MyChart Message (if you have MyChart) OR . A paper copy in the mail If you have any lab test that is abnormal or we need to change your treatment, we will call you to review the results.  Follow-Up: At [facility], you and your health needs are our priority.  As part of our continuing mission to provide you with exceptional heart care, we have created designated Provider Care Teams.  These Care Teams include your primary Cardiologist (physician) and Advanced Practice Providers (APPs -  Physician Assistants and Nurse Practitioners) who all work together to provide you with the care you need, when you need it.  We recommend signing up for the patient portal called "MyChart".  Sign up information is provided on this After Visit Summary.  MyChart is used to connect with patients for Virtual Visits (Telemedicine).  Patients are able to view lab/test results, encounter notes, upcoming appointments, etc.  Non-urgent messages can be sent to your provider as well.   To learn more about what you can do with MyChart, go to [email or web address].    Your next appointment:   6 month(s)  The format for your next appointment:   In Person  Provider:   [name], MD   Thank you for choosing [facility]!!

## 2020-02-06 NOTE — Progress Notes (Signed)
Cardiology Office Note:    Date:  02/06/2020   ID:  Jay Day, DOB 16-Feb-1941, MRN [id number]  PCP:  Leeroy Cha, MD  [facility] Cardiologist:  [name], MD  [facility] Electrophysiologist:  None   Referring MD: Leeroy Cha,*    History of Present Illness:    Jay Day is a 79 y.o. male here for follow-up of nonobstructive coronary artery disease, mild nonischemic cardiomyopathy with resolution.  Was in the ER on 12/04/2019 after a fall.  He was nauseated.  Briefly passed out.  This was while sitting on the toilet.  Past Medical History:  Diagnosis Date  . Asthma    hx of as child  . Bronchitis    as a child  . Cataract   . Chronic anticoagulation 06/11/2013  . Clotting disorder ([place])   . DVT (deep venous thrombosis) ([place])   . 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 Elevated PSA    being monitored by physician  . Glaucoma 10/13/2011  . 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 Headache disorder 02/24/2015   occ  . Hypertension    sees [name], primary   . MI (myocardial infarction) ([place]) not sure when   scar tissue saw  . Peyronie's disease   . 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 Pituitary tumor    takes medication to manage  . Prostatitis   . Pulmonary embolism ([place]) yrs ago  . Pulmonary embolism ([place])   . Rash    last 3 months chest and arms, saw md no tx given  . 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 Stroke [facility]) 2017   mild cva  . Stroke [facility]) 2016    Past Surgical History:  Procedure Laterality Date  . COLONOSCOPY N/A 08/22/2016   Procedure: COLONOSCOPY;  Surgeon: Garlan Fair, MD;  [facility];  Service: Endoscopy;   Laterality: N/A;  . colonscopy  2007  . EYE SURGERY     bilateral cataract surgery  . HERNIA REPAIR  1960  . HERNIA REPAIR    . INGUINAL HERNIA REPAIR  08/12/2011   Procedure: LAPAROSCOPIC INGUINAL HERNIA;  Surgeon: [name], MD;  Location: [facility];  Service: General;  Laterality: Left;  . [place]   left   . LEFT HEART CATHETERIZATION WITH CORONARY ANGIOGRAM N/A 05/06/2014   Procedure: LEFT HEART CATHETERIZATION WITH CORONARY ANGIOGRAM;  Surgeon: [name], MD;  Location: [facility] CATH LAB;  Service: Cardiovascular;  Laterality: N/A;  . LYMPHADENECTOMY Bilateral 09/06/2019   Procedure: LYMPHADENECTOMY;  Surgeon: Alexis Frock, MD;  [facility];  Service: Urology;  Laterality: Bilateral;  . ROBOT ASSISTED LAPAROSCOPIC RADICAL PROSTATECTOMY N/A 09/06/2019   Procedure: XI ROBOTIC ASSISTED LAPAROSCOPIC RADICAL PROSTATECTOMY;  Surgeon: Alexis Frock, MD;  [facility];  Service: Urology;  Laterality: N/A;  3 HRS  . TONSILLECTOMY     as a child    Current Medications: Current Meds  Medication Sig  . bromocriptine (PARLODEL) 2.5 MG tablet TAKE 1/2 TABLET BY MOUTH 5 TIMES A WEEK  . dorzolamide-timolol (COSOPT) 22.3-6.8 MG/ML ophthalmic solution Place 1 drop into both eyes 2 (two) times daily.   Marland Kitchen ELIQUIS 5 MG  TABS tablet Take 5 mg by mouth 2 (two) times daily.  Marland Kitchen HYDROcodone-acetaminophen (NORCO) 5-325 MG tablet Take 1-2 tablets by mouth every 6 (six) hours as needed for moderate pain.  Marland Kitchen latanoprost (XALATAN) 0.005 % ophthalmic solution Place 1 drop into both eyes at bedtime.   . metoprolol succinate (TOPROL-XL) 25 MG 24 hr tablet Take 0.5 tablets (12.5 mg total) by mouth daily.  . [DISCONTINUED] metoprolol succinate (TOPROL-XL) 25 MG 24 hr tablet Take 12.5 mg by mouth daily.     Allergies:   Gluten meal and Wheat bran   Social History   Socioeconomic History  . Marital status: Married    Spouse name: Not on file  . Number of children: 2  . Years of education:  BA  . Highest education level: Not on file  Occupational History  . Occupation: [employer]  Tobacco Use  . Smoking status: Former Smoker    Packs/day: 0.30    Years: 5.00    Pack years: 1.50    Types: Cigarettes    Quit date: 1968    Years since quitting: 53.6  . Smokeless tobacco: Never Used  Vaping Use  . Vaping Use: Never used  Substance and Sexual Activity  . Alcohol use: Never  . Drug use: Never  . Sexual activity: Not on file  Other Topics Concern  . Not on file  Social History Narrative   ** Merged History Encounter **       Patient drinks about 2 cups of caffeine daily. Patient is left handed.   Social Determinants of Health   Financial Resource Strain:   . Difficulty of Paying Living Expenses: Not on file  Food Insecurity:   . Worried About Charity fundraiser in the Last Year: Not on file  . Ran Out of Food in the Last Year: Not on file  Transportation Needs:   . Lack of Transportation (Medical): Not on file  . Lack of Transportation (Non-Medical): Not on file  Physical Activity:   . Days of Exercise per Week: Not on file  . Minutes of Exercise per Session: Not on file  Stress:   . Feeling of Stress : Not on file  Social Connections:   . Frequency of Communication with Friends and Family: Not on file  . Frequency of Social Gatherings with Friends and Family: Not on file  . Attends Religious Services: Not on file  . Active Member of Clubs or Organizations: Not on file  . Attends Archivist Meetings: Not on file  . Marital Status: Not on file     Family History: The patient's family history includes Hypertension in his mother; Kidney Stones in his mother; Kidney failure in his mother; Prostate cancer in his father; Pulmonary embolism in his father, maternal grandfather, maternal grandmother, mother, paternal grandfather, and paternal grandmother.  ROS:   Please see the history of present illness.     All other systems reviewed and are  negative.  EKGs/Labs/Other Studies Reviewed:    The following studies were reviewed today: Prior office notes, ZIO monitor as below.  EKG:  prior sinus bradycardia heart rate 53 with no other significant abnormalities.  Recent Labs: 12/04/2019: ALT 22; BUN 18; Creatinine, Ser 1.48; Hemoglobin 13.7; Platelets 142; Potassium 4.0; Sodium 146  Recent Lipid Panel    Component Value Date/Time   CHOL 159 09/14/2015 0618   TRIG 196 (H) 09/14/2015 0618   HDL 40 (L) 09/14/2015 0618   CHOLHDL 4.0 09/14/2015 3614  VLDL 39 09/14/2015 0618   LDLCALC 80 09/14/2015 0618    Physical Exam:    VS:  BP 114/70   Pulse (!) 52   Ht 6' (1.829 m)   Wt 191 lb (86.6 kg)   SpO2 96%   BMI 25.90 kg/m     Wt Readings from Last 3 Encounters:  02/06/20 191 lb (86.6 kg)  12/04/19 185 lb (83.9 kg)  08/30/19 185 lb (83.9 kg)     GEN:  Well nourished, well developed in no acute distress HEENT: Normal NECK: No JVD; No carotid bruits LYMPHATICS: No lymphadenopathy CARDIAC: RRR, no murmurs, rubs, gallops RESPIRATORY:  Clear to auscultation without rales, wheezing or rhonchi  ABDOMEN: Soft, non-tender, non-distended MUSCULOSKELETAL:  No edema; No deformity  SKIN: Warm and dry NEUROLOGIC:  Alert and oriented x 3 PSYCHIATRIC:  Normal affect   ASSESSMENT:    1. Aortic atherosclerosis ([place])   2. NICM (nonischemic cardiomyopathy) ([place])   3. Coronary artery disease involving native coronary artery of native heart without angina pectoris   4. Medication management    PLAN:    In order of problems listed above:  Nonobstructive coronary artery disease -Cardiac catheterization 2015 reviewed.  Nonobstructive.  Continue with aggressive risk factor modification. --We discussed Crestor 10 mg once a day.  We will recheck lipid profile and ALT in 3 months.  Risk factor modification, plaque stabilization.  Ultimately with CAD which should be on the 20 mg dose. 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  In 2021 echocardiogram was repeated and showed EF of 55 to 60% with LV apical aneurysm with no clot.  Grade 1 diastolic dysfunction.  Syncope -Agree with ER evaluation that this was most likely vasovagal. -A ZIO monitor was ordered at [facility].  Interpreted as sinus rhythm 4 beats of SVT 4 beats of VT by Dr. Fara Olden his primary care physician on 12/06/19.  No significant adverse arrhythmias detected.  No pauses.  Continue with conservative measures such as hydration. -- Will decrease metoprolol to 12.5, resting heart rate 52 bpm.  Stroke 2017 -Thankfully, no recurrence.  Continue with aggressive risk factor prevention.  Several months ago he did go to the ER after having some imbalance concerned about stroke.  MRI was negative for acute CVA.  Long-term monitor was unremarkable.  Personally reviewed and interpreted monitor as above.  DVT/PE -Recurrent PE.  Lifelong anticoagulation with warfarin.  Aortic atherosclerosis -Continue with aggressive risk factor modification.  Prostate cancer -Underwent radical prostatectomy.  Stable.  Left leg swelling -Dependent edema, no change.  Chronic kidney disease stage IIIa -Creatinine 1.48 stable.  Avoid NSAIDs.  Time spent 41 minutes on review of medical records, review of ZIO monitor from outside facility, review of labs from outside, MRI of brain.  6 mth f/u  Medication Adjustments/Labs and Tests Ordered: Current medicines are reviewed at length with the patient today.  Concerns regarding medicines are outlined above.  Orders Placed This Encounter  Procedures  . ALT  . Lipid panel   Meds ordered this encounter  Medications  . metoprolol succinate (TOPROL-XL) 25 MG 24 hr tablet    Sig: Take 0.5 tablets (12.5 mg total) by mouth daily.    Dispense:  45 tablet    Refill:  3  . rosuvastatin (CRESTOR) 10 MG tablet    Sig: Take 1  tablet (10 mg total) by mouth daily.  Dispense:  90 tablet    Refill:  3    Patient Instructions  Medication Instructions:  Please decrease Metoprolol Succinate to 25 mg 1/2 tablet daily.  Start Crestor 10 mg daily.  Continue all other medications as listed.  *If you need a refill on your cardiac medications before your next appointment, please call your pharmacy*  Lab Work: Please have lipid and ALT in 3 months.  If you have labs (blood work) drawn today and your tests are completely normal, you will receive your results only by: Marland Kitchen MyChart Message (if you have MyChart) OR . A paper copy in the mail If you have any lab test that is abnormal or we need to change your treatment, we will call you to review the results.  Follow-Up: At [facility], you and your health needs are our priority.  As part of our continuing mission to provide you with exceptional heart care, we have created designated Provider Care Teams.  These Care Teams include your primary Cardiologist (physician) and Advanced Practice Providers (APPs -  Physician Assistants and Nurse Practitioners) who all work together to provide you with the care you need, when you need it.  We recommend signing up for the patient portal called "MyChart".  Sign up information is provided on this After Visit Summary.  MyChart is used to connect with patients for Virtual Visits (Telemedicine).  Patients are able to view lab/test results, encounter notes, upcoming appointments, etc.  Non-urgent messages can be sent to your provider as well.   To learn more about what you can do with MyChart, go to [email or web address].    Your next appointment:   6 month(s)  The format for your next appointment:   In Person  Provider:   [name], MD   Thank you for choosing [facility]!!         Signed, [name], MD  02/06/2020 10:17 AM    [place]

## 2020-02-20 ENCOUNTER — Other Ambulatory Visit: Payer: Medicare Other

## 2020-02-24 ENCOUNTER — Other Ambulatory Visit: Payer: Medicare Other

## 2020-02-24 DIAGNOSIS — D352 Benign neoplasm of pituitary gland: Secondary | ICD-10-CM | POA: Diagnosis not present

## 2020-02-25 LAB — PROLACTIN: Prolactin: 13.3 ng/mL (ref 4.0–15.2)

## 2020-02-26 ENCOUNTER — Ambulatory Visit (INDEPENDENT_AMBULATORY_CARE_PROVIDER_SITE_OTHER): Payer: Medicare Other | Admitting: Endocrinology

## 2020-02-26 ENCOUNTER — Encounter: Payer: Self-pay | Admitting: Endocrinology

## 2020-02-26 ENCOUNTER — Other Ambulatory Visit: Payer: Self-pay

## 2020-02-26 VITALS — BP 110/70 | HR 57 | Ht 72.0 in | Wt 189.6 lb

## 2020-02-26 DIAGNOSIS — D352 Benign neoplasm of pituitary gland: Secondary | ICD-10-CM

## 2020-02-26 DIAGNOSIS — I2510 Atherosclerotic heart disease of native coronary artery without angina pectoris: Secondary | ICD-10-CM | POA: Diagnosis not present

## 2020-02-26 NOTE — Progress Notes (Signed)
Patient ID: Jay Day, male   DOB: 17-Nov-1940, 79 y.o.   MRN: [id number]          Chief complaint:  Follow-up of prolactinoma tumor    History of Present Illness     Prior history: Apparently in 2003 he was being evaluated by his PCP for complaints of decreased libido. 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   He was treated by his internist with bromocriptine.  Because of his relatively low prolactin level with 2.5 mg of bromocriptine he was tried on half tablet in 2012 and prolactin was normal at 5.2 subsequently He had been taking 2.5 mg again for some time but his prolactin level in 11/2011 was only 2.0, in 7/14 was 1.9 and in 01/2014 his prolactin level was low at 1.6   Follow-up MRI in 04/2012 did not show any change in size of the tumor   RECENT HISTORY:  .  Patient has been treated long-term with low doses of bromocriptine which he takes at night   No evidence of hypopituitarism on his evaluation in 10/2014 MRI brain scan done for unrelated reasons in 06/2016 showed:   T2 hyperintense focus centered within the right aspect of the pituitary gland and cavernous sinus best appreciated on the coronal sequence is decreased in size from the 2003 pituitary MRI of the brain and probably represents residual adenoma. Description of pituitary not in MRI done in 07/2019  He has been on somewhat variable doses of bromocriptine last few years since prolactin tends to be low normal frequently  When he was not taking bromocriptine his prolactin went up to 44 in 04/2019  Since then he had been taking bromocriptine 1.25 mg 5 days a week He takes his bromocriptine at bedtime since otherwise it makes him drowsy  No recent complaints of fatigue or decreased libido, the latter symptoms  have improved  No headaches recently  Prolactin now is normal again at 13, this was checked on Monday and he normally leaves off his bromocriptine on weekends  Testosterone level previously normal  Free T4 has been previously consistently normal  Lab Results  Component Value Date   PROLACTIN 13.3 02/24/2020   PROLACTIN 9.5 08/19/2019   PROLACTIN 43.6 (H) 04/24/2019   PROLACTIN 10.4 02/04/2019   PROLACTIN 4.3 08/03/2018   PROLACTIN 10.7 06/25/2018   PROLACTIN 4.4 12/19/2017   PROLACTIN 15.6 (H) 08/18/2017    Lab Results  Component Value Date   TSH 1.29 07/10/2018   FREET4 1.04 02/04/2019   FREET4 0.92 12/19/2017   FREET4 0.68 08/18/2017   Lab Results  Component Value Date   TESTOSTERONE 668.75 02/04/2019     Allergies as of 02/26/2020      Reactions   Gluten Meal Other (See Comments)   Gluten Sensitivity (reaction undefined) NO BREAD   Wheat Bran Other (See Comments)   Reaction undefined      Medication List       Accurate as of February 26, 2020  4:24 PM. If you have any questions, ask your nurse or doctor.        bromocriptine 2.5 MG tablet Commonly known as: PARLODEL TAKE 1/2 TABLET BY MOUTH 5 TIMES A WEEK   dorzolamide-timolol 22.3-6.8 MG/ML ophthalmic solution Commonly known as: COSOPT Place 1  drop into both eyes 2 (two) times daily.   Eliquis 5 MG Tabs tablet Generic drug: apixaban Take 5 mg by mouth 2 (two) times daily.   HYDROcodone-acetaminophen 5-325 MG tablet Commonly known as: Norco Take 1-2 tablets by mouth every 6 (six) hours as needed for moderate pain.   latanoprost 0.005 % ophthalmic solution Commonly known as: XALATAN Place 1 drop into both eyes at bedtime.   metoprolol succinate 25 MG 24 hr tablet Commonly known as: TOPROL-XL Take 0.5 tablets (12.5 mg total) by mouth daily.   rosuvastatin 10 MG tablet Commonly known as: CRESTOR Take 1 tablet (10 mg total) by mouth daily.       Allergies:  Allergies  Allergen  Reactions  . Gluten Meal Other (See Comments)    Gluten Sensitivity (reaction undefined) NO BREAD  . Wheat Bran Other (See Comments)    Reaction undefined    Past Medical History:  Diagnosis Date  . Asthma    hx of as child  . Bronchitis    as a child  . Cataract   . Chronic anticoagulation 06/11/2013  . Clotting disorder ([place])   . DVT (deep venous thrombosis) ([place])   . 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 Elevated PSA    being monitored by physician  . Glaucoma 10/13/2011  . 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 Headache disorder 02/24/2015   occ  . Hypertension    sees [name], primary   . MI (myocardial infarction) ([place]) not sure when   scar tissue saw  . Peyronie's disease   . 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 Pituitary tumor    takes medication to manage  . Prostatitis   . Pulmonary embolism ([place]) yrs ago  . Pulmonary embolism ([place])   . Rash    last 3 months chest and arms, saw md no tx given  . 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 Stroke [facility]) 2017   mild cva  . Stroke [facility]) 2016    Past Surgical History:  Procedure Laterality Date  . COLONOSCOPY N/A 08/22/2016   Procedure: COLONOSCOPY;  Surgeon: Garlan Fair, MD;  [facility];  Service: Endoscopy;  Laterality: N/A;  . colonscopy  2007  . EYE SURGERY     bilateral cataract surgery  . HERNIA REPAIR  1960  . HERNIA REPAIR    . INGUINAL HERNIA REPAIR  08/12/2011   Procedure: LAPAROSCOPIC INGUINAL HERNIA;  Surgeon: [name], MD;  Location: [facility];  Service: General;  Laterality: Left;  . [place]   left   . LEFT HEART CATHETERIZATION WITH CORONARY ANGIOGRAM N/A 05/06/2014   Procedure:  LEFT HEART CATHETERIZATION WITH CORONARY ANGIOGRAM;  Surgeon: [name], MD;  Location: [facility] CATH LAB;  Service: Cardiovascular;  Laterality: N/A;  . LYMPHADENECTOMY Bilateral 09/06/2019   Procedure: LYMPHADENECTOMY;  Surgeon: Alexis Frock, MD;  [facility];  Service: Urology;  Laterality: Bilateral;  . ROBOT ASSISTED LAPAROSCOPIC RADICAL PROSTATECTOMY N/A 09/06/2019   Procedure: XI ROBOTIC ASSISTED LAPAROSCOPIC RADICAL PROSTATECTOMY;  Surgeon: Alexis Frock, MD;  [facility];  Service: Urology;  Laterality: N/A;  3 HRS  . TONSILLECTOMY     as a  child    Family History  Problem Relation Age of Onset  . Pulmonary embolism Mother   . Kidney failure Mother   . Hypertension Mother   . Kidney Stones Mother   . Pulmonary embolism Father   . Prostate cancer Father   . Pulmonary embolism Maternal Grandmother   . Pulmonary embolism Maternal Grandfather   . Pulmonary embolism Paternal Grandmother   . Pulmonary embolism Paternal Grandfather     Social History:  reports that he quit smoking about 53 years ago. His smoking use included cigarettes. He has a 1.50 pack-year smoking history. He has never used smokeless tobacco. He reports that he does not drink alcohol and does not use drugs.  ROS  Followed by PCP for hypertension, on metoprolol   General Examination:   BP 110/70 (BP Location: Left Arm, Patient Position: Sitting, Cuff Size: Normal)   Pulse (!) 57   Ht 6' (1.829 m)   Wt 189 lb 9.6 oz (86 kg)   SpO2 97%   BMI 25.71 kg/m     Assessment/ Plan:  PROLACTINOMA: Has a history of a 1.4 cm pituitary tumor at baseline in 2003 and his initial prolactin level was high at 99  His initial symptoms were mostly related to decreased libido  Had been well controlled with low doses of bromocriptine for several years now Has been continued on bromocriptine 1.25 mg, taking at bedtime on Mondays through Fridays  With a trial of stopping his bromocriptine his prolactin has  gone up to 43.6 compared to 10.4 Prolactin is consistently normal now  His testosterone level and free T4 previously normal  Last MRI of brain did not show any gross pituitary enlargement   He will continue 1.25 mg bromocriptine 5 days a week and follow-up in 6 months    Shanekqua Schaper 02/26/2020, 4:24 PM

## 2020-03-05 DIAGNOSIS — M6281 Muscle weakness (generalized): Secondary | ICD-10-CM | POA: Diagnosis not present

## 2020-03-05 DIAGNOSIS — N393 Stress incontinence (female) (male): Secondary | ICD-10-CM | POA: Diagnosis not present

## 2020-03-05 DIAGNOSIS — M62838 Other muscle spasm: Secondary | ICD-10-CM | POA: Diagnosis not present

## 2020-03-07 DIAGNOSIS — I10 Essential (primary) hypertension: Secondary | ICD-10-CM | POA: Diagnosis not present

## 2020-03-07 DIAGNOSIS — I2510 Atherosclerotic heart disease of native coronary artery without angina pectoris: Secondary | ICD-10-CM | POA: Diagnosis not present

## 2020-03-07 DIAGNOSIS — C61 Malignant neoplasm of prostate: Secondary | ICD-10-CM | POA: Diagnosis not present

## 2020-03-17 DIAGNOSIS — Z23 Encounter for immunization: Secondary | ICD-10-CM | POA: Diagnosis not present

## 2020-04-07 ENCOUNTER — Encounter: Payer: Self-pay | Admitting: Dermatology

## 2020-04-07 ENCOUNTER — Ambulatory Visit (INDEPENDENT_AMBULATORY_CARE_PROVIDER_SITE_OTHER): Payer: Medicare Other | Admitting: Dermatology

## 2020-04-07 ENCOUNTER — Other Ambulatory Visit: Payer: Self-pay | Admitting: Endocrinology

## 2020-04-07 ENCOUNTER — Other Ambulatory Visit: Payer: Self-pay

## 2020-04-07 DIAGNOSIS — Z1283 Encounter for screening for malignant neoplasm of skin: Secondary | ICD-10-CM | POA: Diagnosis not present

## 2020-04-07 DIAGNOSIS — Z85828 Personal history of other malignant neoplasm of skin: Secondary | ICD-10-CM

## 2020-04-07 DIAGNOSIS — Z8589 Personal history of malignant neoplasm of other organs and systems: Secondary | ICD-10-CM

## 2020-04-07 DIAGNOSIS — L820 Inflamed seborrheic keratosis: Secondary | ICD-10-CM | POA: Diagnosis not present

## 2020-04-07 DIAGNOSIS — I2510 Atherosclerotic heart disease of native coronary artery without angina pectoris: Secondary | ICD-10-CM | POA: Diagnosis not present

## 2020-04-07 DIAGNOSIS — D485 Neoplasm of uncertain behavior of skin: Secondary | ICD-10-CM

## 2020-04-07 NOTE — Patient Instructions (Signed)

## 2020-04-09 DIAGNOSIS — N393 Stress incontinence (female) (male): Secondary | ICD-10-CM | POA: Diagnosis not present

## 2020-04-09 DIAGNOSIS — M62838 Other muscle spasm: Secondary | ICD-10-CM | POA: Diagnosis not present

## 2020-04-09 DIAGNOSIS — M6281 Muscle weakness (generalized): Secondary | ICD-10-CM | POA: Diagnosis not present

## 2020-04-23 DIAGNOSIS — I10 Essential (primary) hypertension: Secondary | ICD-10-CM | POA: Diagnosis not present

## 2020-04-23 DIAGNOSIS — C61 Malignant neoplasm of prostate: Secondary | ICD-10-CM | POA: Diagnosis not present

## 2020-04-23 DIAGNOSIS — I2510 Atherosclerotic heart disease of native coronary artery without angina pectoris: Secondary | ICD-10-CM | POA: Diagnosis not present

## 2020-04-30 DIAGNOSIS — M62838 Other muscle spasm: Secondary | ICD-10-CM | POA: Diagnosis not present

## 2020-04-30 DIAGNOSIS — N393 Stress incontinence (female) (male): Secondary | ICD-10-CM | POA: Diagnosis not present

## 2020-04-30 DIAGNOSIS — M6281 Muscle weakness (generalized): Secondary | ICD-10-CM | POA: Diagnosis not present

## 2020-05-08 NOTE — Progress Notes (Signed)
   Follow-Up Visit   Subjective  Jay Day is a 79 y.o. male who presents for the following: Seborrheic Keratosis (RIGHT TEMPLE LARGE AND DRY).  New growths Location: Right cheekbone Duration:  Quality:  Associated Signs/Symptoms: Modifying Factors:  Severity:  Timing: Context: History of skin cancer chest, but would like general skin exam  Objective  Well appearing patient in no apparent distress; mood and affect are within normal limits.  All skin waist up examined.   Assessment & Plan    History of squamous cell carcinoma Chest - Medial (Center)  Recheck as needed change  Screening exam for skin cancer Left Abdomen (side) - Upper  Annual skin examination  Neoplasm of uncertain behavior of skin Right Zygomatic Area  Skin / nail biopsy Type of biopsy: tangential   Informed consent: discussed and consent obtained   Timeout: patient name, date of birth, surgical site, and procedure verified   Procedure prep:  Patient was prepped and draped in usual sterile fashion (Non sterile) Prep type:  Chlorhexidine Anesthesia: the lesion was anesthetized in a standard fashion   Anesthetic:  1% lidocaine w/ epinephrine 1-100,000 local infiltration Instrument used: flexible razor blade   Hemostasis achieved with: electrodesiccation   Outcome: patient tolerated procedure well   Post-procedure details: sterile dressing applied and wound care instructions given   Dressing type: bandage and petrolatum    Specimen 1 - Surgical pathology Differential Diagnosis: R/O ISK Check Margins: No      I, Lavonna Monarch, MD, have reviewed all documentation for this visit.  The documentation on 05/08/20 for the exam, diagnosis, procedures, and orders are all accurate and complete.

## 2020-05-12 ENCOUNTER — Other Ambulatory Visit: Payer: Self-pay

## 2020-05-12 ENCOUNTER — Other Ambulatory Visit: Payer: Medicare Other | Admitting: *Deleted

## 2020-05-12 DIAGNOSIS — Z79899 Other long term (current) drug therapy: Secondary | ICD-10-CM | POA: Diagnosis not present

## 2020-05-12 DIAGNOSIS — I700 Atherosclerosis of aorta: Secondary | ICD-10-CM | POA: Diagnosis not present

## 2020-05-12 DIAGNOSIS — I2510 Atherosclerotic heart disease of native coronary artery without angina pectoris: Secondary | ICD-10-CM | POA: Diagnosis not present

## 2020-05-12 LAB — LIPID PANEL
Chol/HDL Ratio: 2.1 ratio (ref 0.0–5.0)
Cholesterol, Total: 111 mg/dL (ref 100–199)
HDL: 53 mg/dL (ref >39)
LDL Chol Calc (NIH): 44 mg/dL (ref 0–99)
Triglycerides: 65 mg/dL (ref 0–149)
VLDL Cholesterol Cal: 14 mg/dL (ref 5–40)

## 2020-05-12 LAB — ALT: ALT: 34 IU/L (ref 0–44)

## 2020-05-14 DIAGNOSIS — D352 Benign neoplasm of pituitary gland: Secondary | ICD-10-CM | POA: Diagnosis not present

## 2020-05-14 DIAGNOSIS — Z0000 Encounter for general adult medical examination without abnormal findings: Secondary | ICD-10-CM | POA: Diagnosis not present

## 2020-05-14 DIAGNOSIS — C61 Malignant neoplasm of prostate: Secondary | ICD-10-CM | POA: Diagnosis not present

## 2020-05-14 DIAGNOSIS — I2510 Atherosclerotic heart disease of native coronary artery without angina pectoris: Secondary | ICD-10-CM | POA: Diagnosis not present

## 2020-05-14 DIAGNOSIS — I10 Essential (primary) hypertension: Secondary | ICD-10-CM | POA: Diagnosis not present

## 2020-05-14 DIAGNOSIS — Z8673 Personal history of transient ischemic attack (TIA), and cerebral infarction without residual deficits: Secondary | ICD-10-CM | POA: Diagnosis not present

## 2020-05-14 DIAGNOSIS — Z7189 Other specified counseling: Secondary | ICD-10-CM | POA: Diagnosis not present

## 2020-05-14 DIAGNOSIS — Z1389 Encounter for screening for other disorder: Secondary | ICD-10-CM | POA: Diagnosis not present

## 2020-05-14 DIAGNOSIS — Z86711 Personal history of pulmonary embolism: Secondary | ICD-10-CM | POA: Diagnosis not present

## 2020-05-14 DIAGNOSIS — Z23 Encounter for immunization: Secondary | ICD-10-CM | POA: Diagnosis not present

## 2020-05-28 DIAGNOSIS — H401131 Primary open-angle glaucoma, bilateral, mild stage: Secondary | ICD-10-CM | POA: Diagnosis not present

## 2020-06-16 DIAGNOSIS — C61 Malignant neoplasm of prostate: Secondary | ICD-10-CM | POA: Diagnosis not present

## 2020-06-18 DIAGNOSIS — M62838 Other muscle spasm: Secondary | ICD-10-CM | POA: Diagnosis not present

## 2020-06-18 DIAGNOSIS — N393 Stress incontinence (female) (male): Secondary | ICD-10-CM | POA: Diagnosis not present

## 2020-06-18 DIAGNOSIS — M6281 Muscle weakness (generalized): Secondary | ICD-10-CM | POA: Diagnosis not present

## 2020-06-22 DIAGNOSIS — N5231 Erectile dysfunction following radical prostatectomy: Secondary | ICD-10-CM | POA: Diagnosis not present

## 2020-06-22 DIAGNOSIS — N393 Stress incontinence (female) (male): Secondary | ICD-10-CM | POA: Diagnosis not present

## 2020-06-30 DIAGNOSIS — C61 Malignant neoplasm of prostate: Secondary | ICD-10-CM | POA: Diagnosis not present

## 2020-06-30 DIAGNOSIS — I10 Essential (primary) hypertension: Secondary | ICD-10-CM | POA: Diagnosis not present

## 2020-06-30 DIAGNOSIS — I2510 Atherosclerotic heart disease of native coronary artery without angina pectoris: Secondary | ICD-10-CM | POA: Diagnosis not present

## 2020-07-13 DIAGNOSIS — K649 Unspecified hemorrhoids: Secondary | ICD-10-CM | POA: Diagnosis not present

## 2020-07-13 DIAGNOSIS — L309 Dermatitis, unspecified: Secondary | ICD-10-CM | POA: Diagnosis not present

## 2020-07-13 DIAGNOSIS — Z9079 Acquired absence of other genital organ(s): Secondary | ICD-10-CM | POA: Diagnosis not present

## 2020-07-13 DIAGNOSIS — Z86711 Personal history of pulmonary embolism: Secondary | ICD-10-CM | POA: Diagnosis not present

## 2020-07-13 DIAGNOSIS — D6859 Other primary thrombophilia: Secondary | ICD-10-CM | POA: Diagnosis not present

## 2020-07-17 IMAGING — MR MR HEAD W/O CM
7 of 11 series · 26 of 48 positions shown · non-contrast
Comparison: Head without contrast 06/18/2016.

CLINICAL DATA: Vertigo, central.

EXAM:
MRI HEAD WITHOUT CONTRAST
TECHNIQUE: Multiplanar, multiecho pulse sequences of the brain and surrounding
structures were obtained without intravenous contrast.

[Series 2: DWI · axial · 3.0mm · 0.94mm/px · z∈[-77,+82]mm · 8 of 108 slices shown (1 of 2)]
[im 1/108]
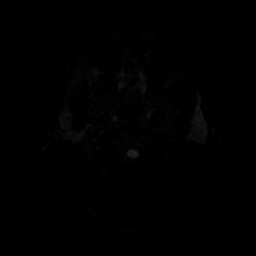
[im 16/108]
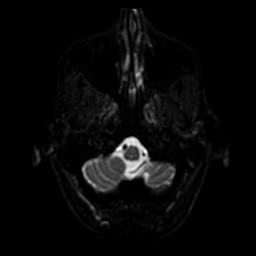
[im 31/108]
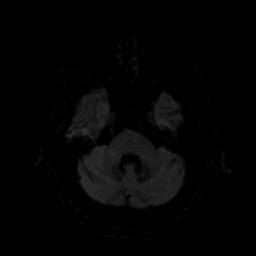
[im 46/108]
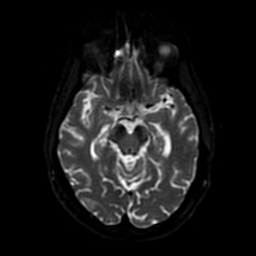
[im 62/108]
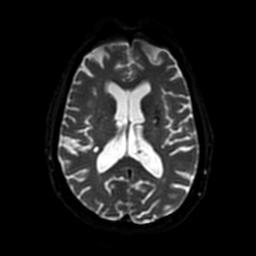
[im 77/108]
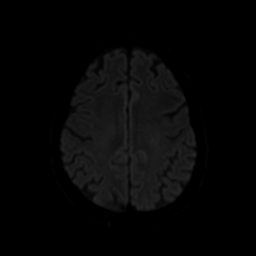
[im 92/108]
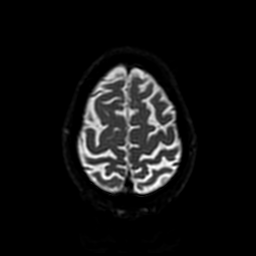
[im 108/108]
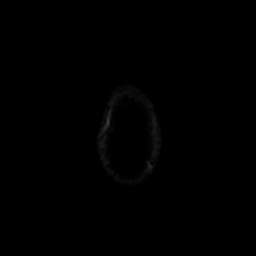

[Series 3: DWI · coronal · 4.0mm · 0.94mm/px · 6 of 88 slices shown (2 of 2)]
[im 1/88]
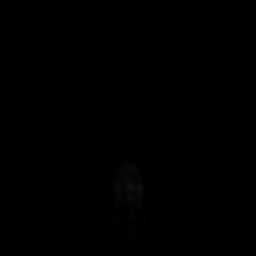
[im 18/88]
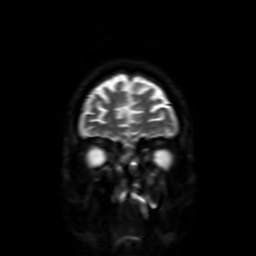
[im 35/88]
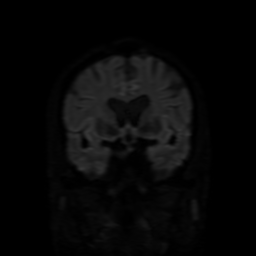
[im 53/88]
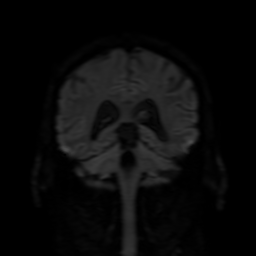
[im 70/88]
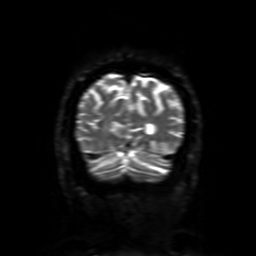
[im 88/88]
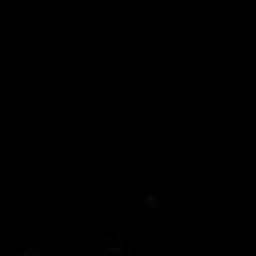

[Series 4: FLAIR · sagittal · 5.0mm · 0.23mm/px · 2 of 28 slices shown (1 of 2)]
[im 1/28]
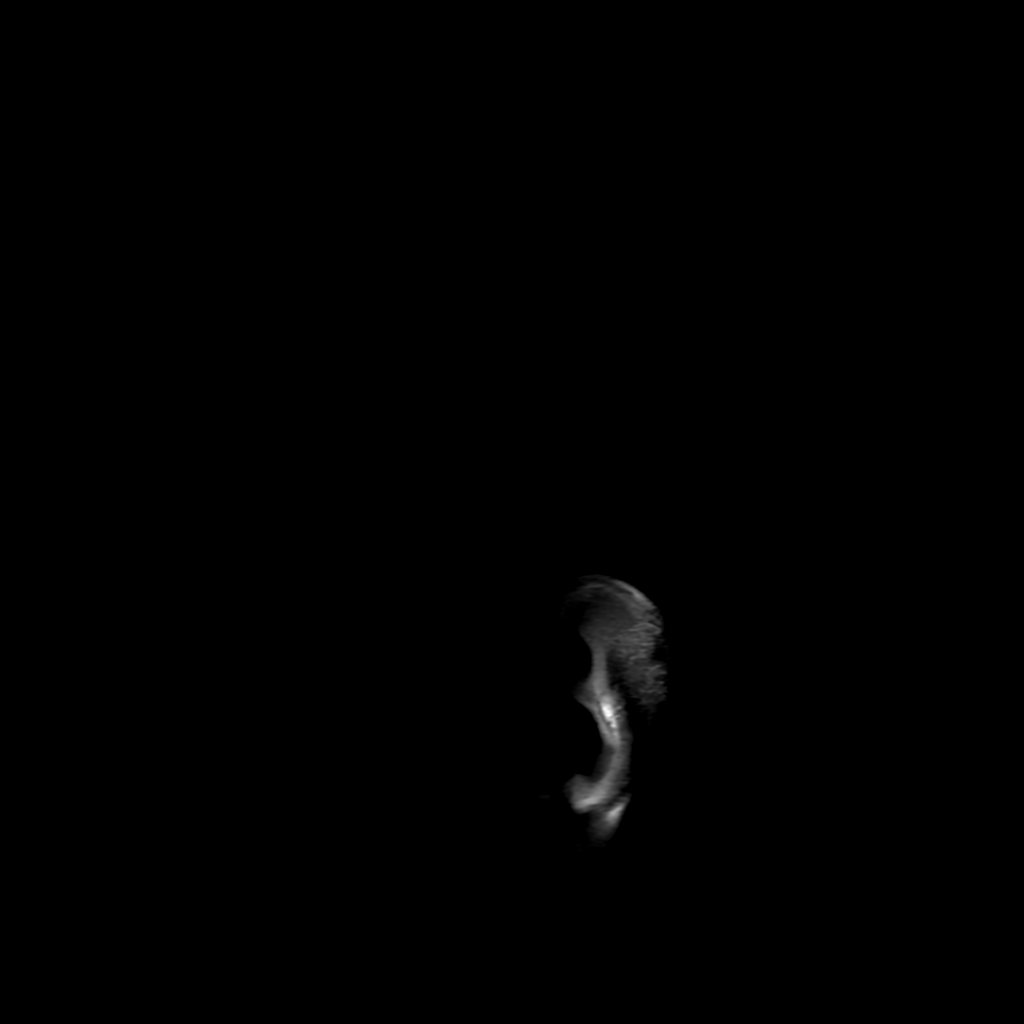
[im 28/28]
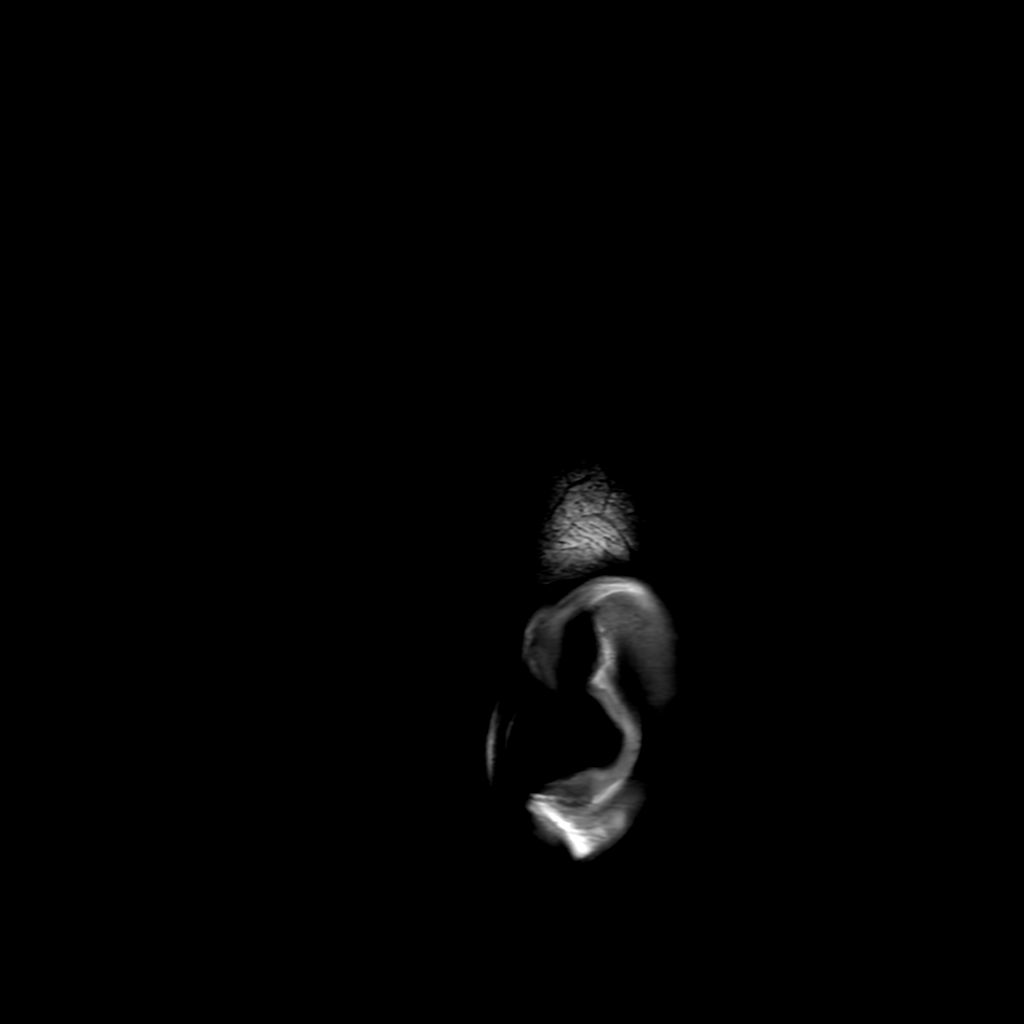

[Series 5: T2 · axial · 5.0mm · 0.23mm/px · 1 of 28 slices shown]
[im 1/28]
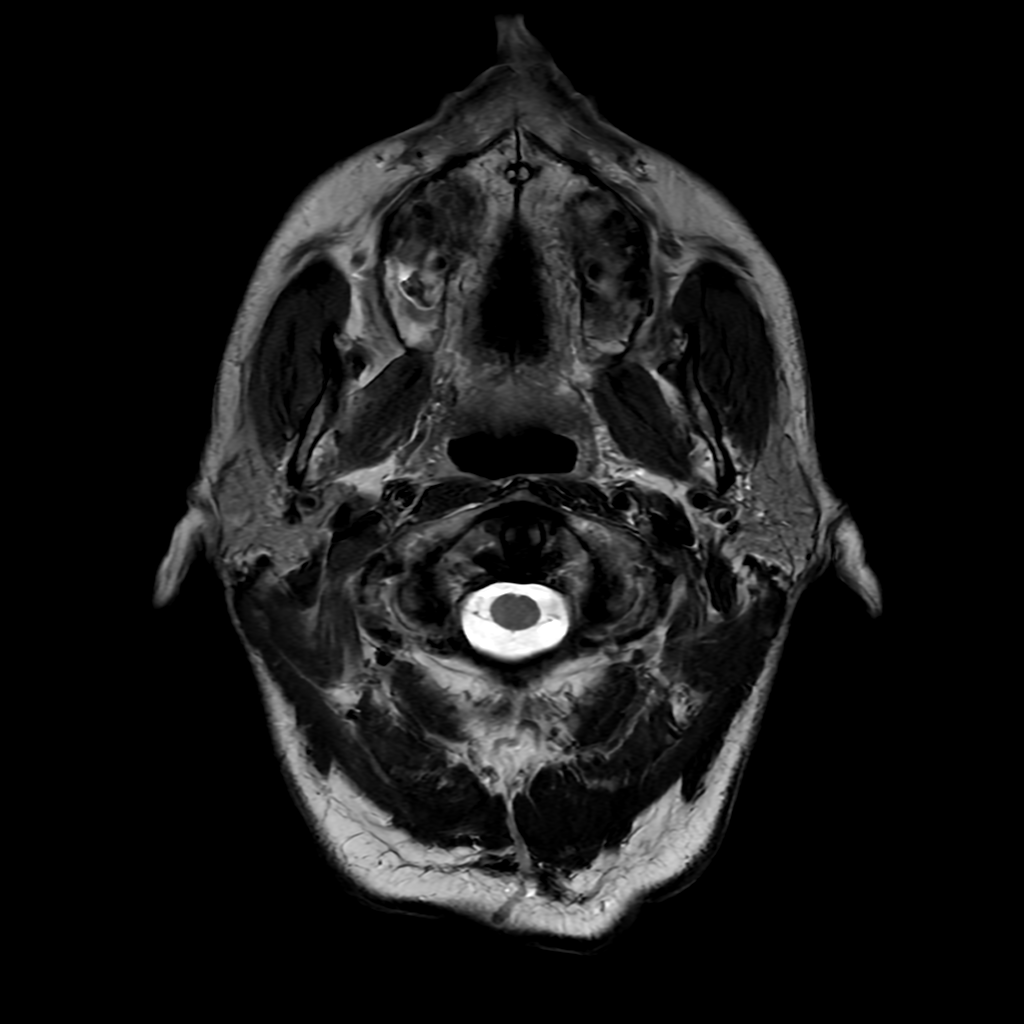

[Series 6: FLAIR · axial · 5.0mm · 0.47mm/px · z∈[-82,+80]mm · 2 of 28 slices shown (2 of 2)]
[im 1/28]
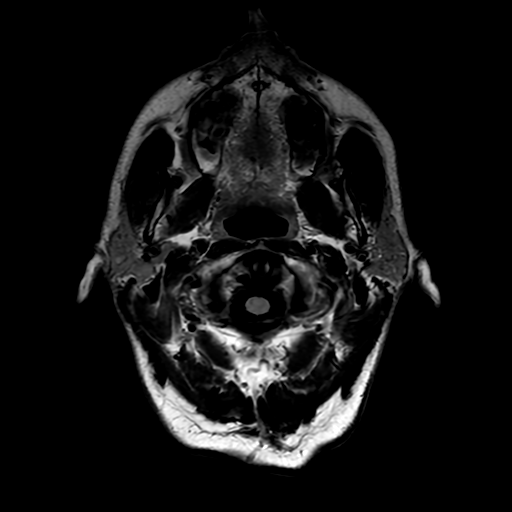
[im 28/28]
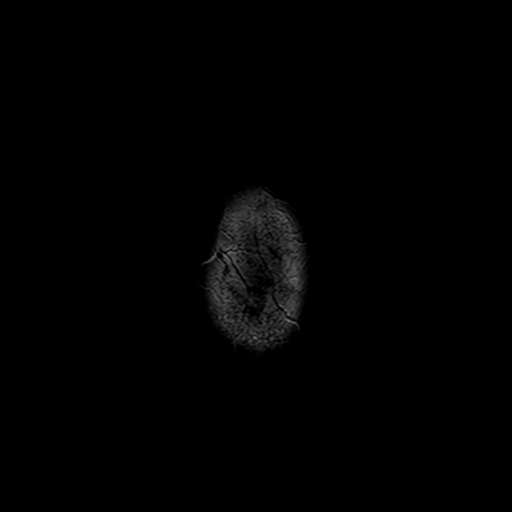

[Series 250: ADC · axial · 3.0mm · 0.94mm/px · z∈[-77,+82]mm · 4 of 54 slices shown (1 of 2)]
[im 1/54]
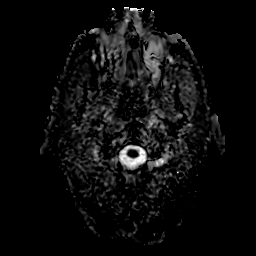
[im 18/54]
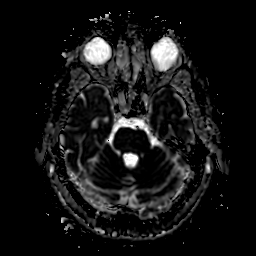
[im 36/54]
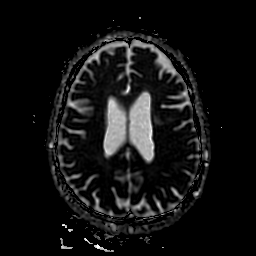
[im 54/54]
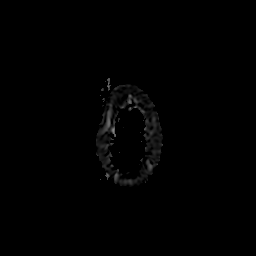

[Series 350: ADC · coronal · 4.0mm · 0.94mm/px · 3 of 44 slices shown (2 of 2)]
[im 1/44]
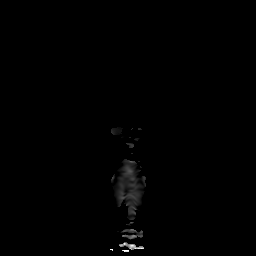
[im 22/44]
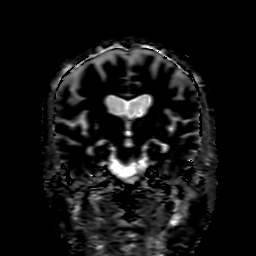
[im 44/44]
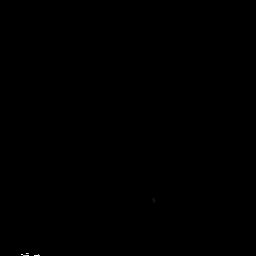

[26 of 48 positions shown; findings below may reference images not displayed]

FINDINGS: Brain: Remote lacunar infarct of the right corona radiata and
posterior right periventricular cyst are stable. White matter
changes are otherwise within normal limits for age. The ventricles
are of normal size. No significant extraaxial fluid collection is
present.

Cystic area along the right side of the sella and cavernous sinus is
stable.

Vascular: No significant extraaxial fluid collection is present.

Skull and upper cervical spine: The craniocervical junction is
normal. Upper cervical spine is within normal limits. Marrow signal
is unremarkable.

Sinuses/Orbits: Diffuse mucosal thickening is present throughout the
paranasal sinuses. Scattered opacification present within ethmoid
air cells. Small amount of fluid is present in mastoid air cells. No
obstructing nasopharyngeal lesion is present.
IMPRESSION: 1. No acute intracranial abnormality or significant interval change.
2. Remote lacunar infarct of the left corona radiata and cyst the
posterior right corona radiata are stable.
3. Mild diffuse sinus disease.

## 2020-07-23 DIAGNOSIS — M6281 Muscle weakness (generalized): Secondary | ICD-10-CM | POA: Diagnosis not present

## 2020-07-23 DIAGNOSIS — M62838 Other muscle spasm: Secondary | ICD-10-CM | POA: Diagnosis not present

## 2020-07-23 DIAGNOSIS — N393 Stress incontinence (female) (male): Secondary | ICD-10-CM | POA: Diagnosis not present

## 2020-07-31 DIAGNOSIS — Z86711 Personal history of pulmonary embolism: Secondary | ICD-10-CM | POA: Diagnosis not present

## 2020-07-31 DIAGNOSIS — I6930 Unspecified sequelae of cerebral infarction: Secondary | ICD-10-CM | POA: Diagnosis not present

## 2020-07-31 DIAGNOSIS — I2510 Atherosclerotic heart disease of native coronary artery without angina pectoris: Secondary | ICD-10-CM | POA: Diagnosis not present

## 2020-07-31 DIAGNOSIS — C61 Malignant neoplasm of prostate: Secondary | ICD-10-CM | POA: Diagnosis not present

## 2020-07-31 DIAGNOSIS — R55 Syncope and collapse: Secondary | ICD-10-CM | POA: Diagnosis not present

## 2020-07-31 DIAGNOSIS — I10 Essential (primary) hypertension: Secondary | ICD-10-CM | POA: Diagnosis not present

## 2020-08-13 ENCOUNTER — Ambulatory Visit (INDEPENDENT_AMBULATORY_CARE_PROVIDER_SITE_OTHER): Payer: Medicare Other | Admitting: Cardiology

## 2020-08-13 ENCOUNTER — Encounter: Payer: Self-pay | Admitting: Cardiology

## 2020-08-13 ENCOUNTER — Other Ambulatory Visit: Payer: Self-pay

## 2020-08-13 VITALS — BP 110/70 | HR 78 | Ht 72.0 in | Wt 196.0 lb

## 2020-08-13 DIAGNOSIS — I428 Other cardiomyopathies: Secondary | ICD-10-CM | POA: Diagnosis not present

## 2020-08-13 DIAGNOSIS — I2510 Atherosclerotic heart disease of native coronary artery without angina pectoris: Secondary | ICD-10-CM

## 2020-08-13 DIAGNOSIS — I700 Atherosclerosis of aorta: Secondary | ICD-10-CM | POA: Diagnosis not present

## 2020-08-13 NOTE — Progress Notes (Signed)
Cardiology Office Note:    Date:  08/13/2020   ID:  Jay Day, DOB 06-Mar-1941, MRN [id number]  PCP:  Leeroy Cha, [place]  Cardiologist:  [name], MD  Advanced Practice Provider:  No care team member to display Electrophysiologist:  None       Referring MD: Leeroy Cha,*     History of Present Illness:    Jay Day is a 80 y.o. 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  Back in June 2021 he sustained a fall, was nauseated, briefly passed out this was while sitting on the toilet.  Vasovagal type symptoms.  Had another episode on the toilet. Bike helmet. Nausea sensation. As starting to stand up, holding on the door and lost it.  Vasovagal once again.     Past Medical History:  Diagnosis Date  . Asthma    hx of as child  . Bronchitis    as a child  . Cataract   . Chronic anticoagulation 06/11/2013  . Clotting disorder ([place])   . DVT (deep venous thrombosis) ([place])   . 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 Elevated PSA    being monitored by physician  . Glaucoma 10/13/2011  . 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 Headache disorder 02/24/2015   occ  . Hypertension    sees [name], primary   . MI (myocardial infarction) ([place]) not sure when   scar tissue saw  . Peyronie's disease   . 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 Pituitary tumor    takes medication to manage  . Prostatitis   . Pulmonary embolism ([place]) yrs ago  . Pulmonary embolism ([place])   . Rash    last 3 months chest and arms, saw md no tx given  . 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 Stroke [facility]) 2017   mild cva  . Stroke [facility]) 2016    Past Surgical History:  Procedure Laterality Date  . COLONOSCOPY N/A 08/22/2016   Procedure: COLONOSCOPY;  Surgeon: Garlan Fair, MD;  Location: [facility] ENDOSCOPY;  Service: Endoscopy;  Laterality: N/A;  . colonscopy  2007  . EYE SURGERY     bilateral cataract surgery  . HERNIA REPAIR  1960  . HERNIA REPAIR    . INGUINAL HERNIA REPAIR  08/12/2011   Procedure: LAPAROSCOPIC INGUINAL HERNIA;  Surgeon: [name], MD;  Location: [place];  Service: General;  Laterality: Left;  . [place]   left   . LEFT HEART CATHETERIZATION WITH CORONARY ANGIOGRAM N/A 05/06/2014   Procedure: LEFT HEART CATHETERIZATION WITH CORONARY ANGIOGRAM;  Surgeon: [name], MD;  Location[facility] CATH LAB;  Service: Cardiovascular;  Laterality: N/A;  . LYMPHADENECTOMY Bilateral 09/06/2019   Procedure: LYMPHADENECTOMY;  Surgeon: Alexis Frock, MD;  Location: [facility] ORS;  Service: Urology;  Laterality: Bilateral;  . ROBOT ASSISTED LAPAROSCOPIC RADICAL PROSTATECTOMY N/A 09/06/2019   Procedure: XI ROBOTIC ASSISTED LAPAROSCOPIC RADICAL PROSTATECTOMY;  Surgeon: Alexis Frock, MD;  Location: [facility] ORS;  Service: Urology;  Laterality: N/A;  3 HRS  . TONSILLECTOMY     as a  child    Current Medications: Current Meds  Medication Sig  . bromocriptine (PARLODEL) 2.5 MG tablet TAKE 1/2 TABLET BY MOUTH 5 TIMES A WEEK  . dorzolamide-timolol (COSOPT) 22.3-6.8 MG/ML ophthalmic solution Place 1 drop into both eyes 2 (two) times daily.   Marland Kitchen ELIQUIS 5 MG TABS tablet Take 5 mg by mouth 2 (two) times daily.  Marland Kitchen HYDROcodone-acetaminophen (NORCO) 5-325 MG tablet Take 1-2 tablets by mouth every 6 (six) hours as needed for moderate pain.  Marland Kitchen latanoprost (XALATAN) 0.005 % ophthalmic solution Place 1 drop into both eyes at bedtime.   . rosuvastatin (CRESTOR) 10 MG tablet Take 1 tablet (10 mg total) by mouth daily.  . [DISCONTINUED] metoprolol  succinate (TOPROL-XL) 25 MG 24 hr tablet Take 0.5 tablets (12.5 mg total) by mouth daily.     Allergies:   Gluten meal and Wheat bran   Social History   Socioeconomic History  . Marital status: Married    Spouse name: Not on file  . Number of children: 2  . Years of education: BA  . Highest education level: Not on file  Occupational History  . Occupation: [employer]  Tobacco Use  . Smoking status: Former Smoker    Packs/day: 0.30    Years: 5.00    Pack years: 1.50    Types: Cigarettes    Quit date: 1968    Years since quitting: 54.2  . Smokeless tobacco: Never Used  Vaping Use  . Vaping Use: Never used  Substance and Sexual Activity  . Alcohol use: Never  . Drug use: Never  . Sexual activity: Not on file  Other Topics Concern  . Not on file  Social History Narrative   ** Merged History Encounter **       Patient drinks about 2 cups of caffeine daily. Patient is left handed.   Social Determinants of Health   Financial Resource Strain: Not on file  Food Insecurity: Not on file  Transportation Needs: Not on file  Physical Activity: Not on file  Stress: Not on file  Social Connections: Not on file     Family History: The patient's family history includes Hypertension in his mother; Kidney Stones in his mother; Kidney failure in his mother; Prostate cancer in his father; Pulmonary embolism in his father, maternal grandfather, maternal grandmother, mother, paternal grandfather, and paternal grandmother.  ROS:   Please see the history of present illness.     All other systems reviewed and are negative.  EKGs/Labs/Other Studies Reviewed:     Recent Labs: 12/04/2019: BUN 18; Creatinine, Ser 1.48; Hemoglobin 13.7; Platelets 142; Potassium 4.0; Sodium 146 05/12/2020: ALT 34  Recent Lipid Panel    Component Value Date/Time   CHOL 111 05/12/2020 0838   TRIG 65 05/12/2020 0838   HDL 53 05/12/2020 0838   CHOLHDL 2.1 05/12/2020 0838   CHOLHDL 4.0 09/14/2015  0618   VLDL 39 09/14/2015 0618   LDLCALC 44 05/12/2020 0838     Risk Assessment/Calculations:      Physical Exam:    VS:  BP 110/70 (BP Location: Left Arm, Patient Position: Sitting, Cuff Size: Normal)   Pulse 78   Ht 6' (1.829 m)   Wt 196 lb (88.9 kg)   SpO2 97%   BMI 26.58 kg/m     Wt Readings from Last 3 Encounters:  08/13/20 196 lb (88.9 kg)  02/26/20 189 lb 9.6 oz (86 kg)  02/06/20 191 lb (86.6 kg)     GEN:  Well nourished,  well developed in no acute distress HEENT: Normal NECK: No JVD; No carotid bruits LYMPHATICS: No lymphadenopathy CARDIAC: RRR, no murmurs, rubs, gallops RESPIRATORY:  Clear to auscultation without rales, wheezing or rhonchi  ABDOMEN: Soft, non-tender, non-distended MUSCULOSKELETAL:  No edema; No deformity  SKIN: Warm and dry NEUROLOGIC:  Alert and oriented x 3 PSYCHIATRIC:  Normal affect   ASSESSMENT:    1. NICM (nonischemic cardiomyopathy) ([place])   2. Coronary artery disease involving native coronary artery of native heart without angina pectoris   3. Aortic atherosclerosis (H[facility])    PLAN:    In order of problems listed above:  Nonobstructive coronary artery disease -Catheterization in 2015 showed nonobstructive disease.  Continue with aggressive risk factor modification. -Crestor 10 mg was utilized.  LDL at repeat was 44.  Excellent.  Nonischemic cardiomyopathy -Echo showed 45 to 50% EF in 2015.  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  Syncope, vasovagal -ZIO monitor from [facility] showed 4 beats of SVT and VT.  No adverse arrhythmias that were sustained.  Continue with hydration.  Salt liberalization.  We will stop his metoprolol 12.5 mg.  Very small dose.  Continue to hydrate well.  Be very careful.  I discussed with EP here in the office.  Classic for vasovagal.  Did not think that an implantable loop recorder would be helpful in this situation.  He is not  having any syncope with any other activities.  Stroke 2017 -No recurrence.  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   Medication Adjustments/Labs and Tests Ordered: Current medicines are reviewed at length with the patient today.  Concerns regarding medicines are outlined above.  No orders of the defined types were placed in this encounter.  No orders of the defined types were placed in this encounter.   Patient Instructions  Medication Instructions:  Stop Metoprolol today *If you need a refill on your cardiac medications before your next appointment, please call your pharmacy*    Follow-Up: At [facility], you and your health needs are our priority.  As part of our continuing mission to provide you with exceptional heart care, we have created designated Provider Care Teams.  These Care Teams include your primary Cardiologist (physician) and Advanced Practice Providers (APPs -  Physician Assistants and Nurse Practitioners) who all work together to provide you with the care you need, when you need it.  We recommend signing up for the patient portal called "MyChart".  Sign up information is provided on this After Visit Summary.  MyChart is used to connect with patients for Virtual Visits (Telemedicine).  Patients are able to view lab/test results, encounter notes, upcoming appointments, etc.  Non-urgent messages can be sent to your provider as well.   To learn more about what you can do with MyChart, go to [email or web address].    Your next appointment:   6 month(s)  The format for your next appointment:   In Person  Provider:   You may see [name], MD  or one of the following Advanced Practice Providers on your designated Care Team:    Kathyrn Drown, NP         Signed, [name], MD  08/13/2020 10:34 AM    [place]

## 2020-08-13 NOTE — Patient Instructions (Signed)
Medication Instructions:  Stop Metoprolol today *If you need a refill on your cardiac medications before your next appointment, please call your pharmacy*    Follow-Up: At [facility], you and your health needs are our priority.  As part of our continuing mission to provide you with exceptional heart care, we have created designated Provider Care Teams.  These Care Teams include your primary Cardiologist (physician) and Advanced Practice Providers (APPs -  Physician Assistants and Nurse Practitioners) who all work together to provide you with the care you need, when you need it.  We recommend signing up for the patient portal called "MyChart".  Sign up information is provided on this After Visit Summary.  MyChart is used to connect with patients for Virtual Visits (Telemedicine).  Patients are able to view lab/test results, encounter notes, upcoming appointments, etc.  Non-urgent messages can be sent to your provider as well.   To learn more about what you can do with MyChart, go to [email or web address].    Your next appointment:   6 month(s)  The format for your next appointment:   In Person  Provider:   You may see [name], MD  or one of the following Advanced Practice Providers on your designated Care Team:    Kathyrn Drown, NP

## 2020-09-09 DIAGNOSIS — N393 Stress incontinence (female) (male): Secondary | ICD-10-CM | POA: Diagnosis not present

## 2020-09-09 DIAGNOSIS — M6281 Muscle weakness (generalized): Secondary | ICD-10-CM | POA: Diagnosis not present

## 2020-09-09 DIAGNOSIS — M62838 Other muscle spasm: Secondary | ICD-10-CM | POA: Diagnosis not present

## 2020-09-16 ENCOUNTER — Other Ambulatory Visit: Payer: Self-pay

## 2020-09-16 ENCOUNTER — Encounter: Payer: Self-pay | Admitting: Dermatology

## 2020-09-16 ENCOUNTER — Ambulatory Visit (INDEPENDENT_AMBULATORY_CARE_PROVIDER_SITE_OTHER): Payer: Medicare Other | Admitting: Dermatology

## 2020-09-16 DIAGNOSIS — L821 Other seborrheic keratosis: Secondary | ICD-10-CM | POA: Diagnosis not present

## 2020-09-16 DIAGNOSIS — B354 Tinea corporis: Secondary | ICD-10-CM

## 2020-09-16 DIAGNOSIS — I2510 Atherosclerotic heart disease of native coronary artery without angina pectoris: Secondary | ICD-10-CM

## 2020-09-16 DIAGNOSIS — L309 Dermatitis, unspecified: Secondary | ICD-10-CM | POA: Diagnosis not present

## 2020-09-16 DIAGNOSIS — Z1283 Encounter for screening for malignant neoplasm of skin: Secondary | ICD-10-CM | POA: Diagnosis not present

## 2020-09-16 LAB — POCT SKIN KOH

## 2020-09-16 MED ORDER — HALOBETASOL PROPIONATE 0.05 % EX CREA: TOPICAL_CREAM | CUTANEOUS | 2 refills | Status: DC

## 2020-09-24 DIAGNOSIS — I2510 Atherosclerotic heart disease of native coronary artery without angina pectoris: Secondary | ICD-10-CM | POA: Diagnosis not present

## 2020-09-24 DIAGNOSIS — C61 Malignant neoplasm of prostate: Secondary | ICD-10-CM | POA: Diagnosis not present

## 2020-09-24 DIAGNOSIS — Z23 Encounter for immunization: Secondary | ICD-10-CM | POA: Diagnosis not present

## 2020-09-24 DIAGNOSIS — I10 Essential (primary) hypertension: Secondary | ICD-10-CM | POA: Diagnosis not present

## 2020-09-24 DIAGNOSIS — M19072 Primary osteoarthritis, left ankle and foot: Secondary | ICD-10-CM | POA: Diagnosis not present

## 2020-09-28 ENCOUNTER — Encounter: Payer: Self-pay | Admitting: Dermatology

## 2020-09-28 NOTE — Progress Notes (Signed)
   Follow-Up Visit   Subjective  Jay Day is a 80 y.o. male who presents for the following: Rash (Rash around neck x 2 months that does itch.  Patient is using hydrocortisone cream to help with the itch. ).  General skin check, new rash on upper torso and neck with some itching Location:  Duration:  Quality:  Associated Signs/Symptoms: Modifying Factors:  Severity:  Timing: Context:   Objective  Well appearing patient in no apparent distress; mood and affect are within normal limits. 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  ST took scraping of right breast area.  KOH negative.  Differential includes nummular eczema and contact dermatitis.    All skin waist up examined.   Assessment & Plan    Tinea corporis  Other Related Procedures POCT Skin KOH Culture, fungus without smear  Screening exam for skin cancer Waist up skin check  Yearly skin check.  Seborrheic keratosis Mid Back  May leave if stable  Dermatitis Right Breast  Apply generic halobetasol daily after bathing for up to 4weeks; if improved, tapered to every other day or twice weekly.  Avoid use on face and body folds.  Follow-up by MyChart or telephone in 3 to 4 weeks.  Ordered Medications: halobetasol (ULTRAVATE) 0.05 % cream      I, Lavonna Monarch, MD, have reviewed all documentation for this visit.  The documentation on 09/28/20 for the exam, diagnosis, procedures, and orders are all accurate and complete.

## 2020-10-02 ENCOUNTER — Ambulatory Visit
Admission: RE | Admit: 2020-10-02 | Discharge: 2020-10-02 | Disposition: A | Discharge status: Home / Self Care | Payer: Medicare Other | Source: Ambulatory Visit | Attending: Internal Medicine | Admitting: Internal Medicine

## 2020-10-02 ENCOUNTER — Other Ambulatory Visit: Payer: Self-pay | Admitting: Internal Medicine

## 2020-10-02 DIAGNOSIS — I10 Essential (primary) hypertension: Secondary | ICD-10-CM | POA: Diagnosis not present

## 2020-10-02 DIAGNOSIS — M19072 Primary osteoarthritis, left ankle and foot: Secondary | ICD-10-CM | POA: Diagnosis not present

## 2020-10-02 DIAGNOSIS — Z86711 Personal history of pulmonary embolism: Secondary | ICD-10-CM | POA: Diagnosis not present

## 2020-10-02 DIAGNOSIS — D6859 Other primary thrombophilia: Secondary | ICD-10-CM | POA: Diagnosis not present

## 2020-10-08 DIAGNOSIS — H401132 Primary open-angle glaucoma, bilateral, moderate stage: Secondary | ICD-10-CM | POA: Diagnosis not present

## 2020-10-08 DIAGNOSIS — H401131 Primary open-angle glaucoma, bilateral, mild stage: Secondary | ICD-10-CM | POA: Diagnosis not present

## 2020-10-16 LAB — CULTURE, FUNGUS WITHOUT SMEAR
CULTURE:: NO GROWTH
MICRO NUMBER:: 11738380
SPECIMEN QUALITY:: ADEQUATE

## 2020-10-27 DIAGNOSIS — Z86711 Personal history of pulmonary embolism: Secondary | ICD-10-CM | POA: Diagnosis not present

## 2020-10-27 DIAGNOSIS — N393 Stress incontinence (female) (male): Secondary | ICD-10-CM | POA: Diagnosis not present

## 2020-10-27 DIAGNOSIS — M62838 Other muscle spasm: Secondary | ICD-10-CM | POA: Diagnosis not present

## 2020-10-27 DIAGNOSIS — R233 Spontaneous ecchymoses: Secondary | ICD-10-CM | POA: Diagnosis not present

## 2020-10-27 DIAGNOSIS — M6281 Muscle weakness (generalized): Secondary | ICD-10-CM | POA: Diagnosis not present

## 2020-10-27 DIAGNOSIS — D6859 Other primary thrombophilia: Secondary | ICD-10-CM | POA: Diagnosis not present

## 2020-10-27 DIAGNOSIS — I6930 Unspecified sequelae of cerebral infarction: Secondary | ICD-10-CM | POA: Diagnosis not present

## 2020-10-27 DIAGNOSIS — N1831 Chronic kidney disease, stage 3a: Secondary | ICD-10-CM | POA: Diagnosis not present

## 2020-10-27 DIAGNOSIS — D6949 Other primary thrombocytopenia: Secondary | ICD-10-CM | POA: Diagnosis not present

## 2020-10-27 DIAGNOSIS — Z7901 Long term (current) use of anticoagulants: Secondary | ICD-10-CM | POA: Diagnosis not present

## 2020-11-05 DIAGNOSIS — N139 Obstructive and reflux uropathy, unspecified: Secondary | ICD-10-CM | POA: Diagnosis not present

## 2020-11-05 DIAGNOSIS — I129 Hypertensive chronic kidney disease with stage 1 through stage 4 chronic kidney disease, or unspecified chronic kidney disease: Secondary | ICD-10-CM | POA: Diagnosis not present

## 2020-11-05 DIAGNOSIS — Z8546 Personal history of malignant neoplasm of prostate: Secondary | ICD-10-CM | POA: Diagnosis not present

## 2020-11-05 DIAGNOSIS — N179 Acute kidney failure, unspecified: Secondary | ICD-10-CM | POA: Diagnosis not present

## 2020-11-10 DIAGNOSIS — I2510 Atherosclerotic heart disease of native coronary artery without angina pectoris: Secondary | ICD-10-CM | POA: Diagnosis not present

## 2020-11-26 DIAGNOSIS — Z8673 Personal history of transient ischemic attack (TIA), and cerebral infarction without residual deficits: Secondary | ICD-10-CM | POA: Diagnosis not present

## 2020-11-26 DIAGNOSIS — I10 Essential (primary) hypertension: Secondary | ICD-10-CM | POA: Diagnosis not present

## 2020-11-26 DIAGNOSIS — Z23 Encounter for immunization: Secondary | ICD-10-CM | POA: Diagnosis not present

## 2020-11-26 DIAGNOSIS — I2510 Atherosclerotic heart disease of native coronary artery without angina pectoris: Secondary | ICD-10-CM | POA: Diagnosis not present

## 2020-11-30 DIAGNOSIS — I10 Essential (primary) hypertension: Secondary | ICD-10-CM | POA: Diagnosis not present

## 2020-11-30 DIAGNOSIS — C61 Malignant neoplasm of prostate: Secondary | ICD-10-CM | POA: Diagnosis not present

## 2020-11-30 DIAGNOSIS — M19072 Primary osteoarthritis, left ankle and foot: Secondary | ICD-10-CM | POA: Diagnosis not present

## 2020-11-30 DIAGNOSIS — I2510 Atherosclerotic heart disease of native coronary artery without angina pectoris: Secondary | ICD-10-CM | POA: Diagnosis not present

## 2020-12-22 DIAGNOSIS — C61 Malignant neoplasm of prostate: Secondary | ICD-10-CM | POA: Diagnosis not present

## 2020-12-31 DIAGNOSIS — N5231 Erectile dysfunction following radical prostatectomy: Secondary | ICD-10-CM | POA: Diagnosis not present

## 2020-12-31 DIAGNOSIS — N393 Stress incontinence (female) (male): Secondary | ICD-10-CM | POA: Diagnosis not present

## 2020-12-31 DIAGNOSIS — C61 Malignant neoplasm of prostate: Secondary | ICD-10-CM | POA: Diagnosis not present

## 2021-02-01 DIAGNOSIS — D352 Benign neoplasm of pituitary gland: Secondary | ICD-10-CM | POA: Diagnosis not present

## 2021-02-01 DIAGNOSIS — I2510 Atherosclerotic heart disease of native coronary artery without angina pectoris: Secondary | ICD-10-CM | POA: Diagnosis not present

## 2021-02-01 DIAGNOSIS — Z86711 Personal history of pulmonary embolism: Secondary | ICD-10-CM | POA: Diagnosis not present

## 2021-02-03 ENCOUNTER — Other Ambulatory Visit: Payer: Self-pay | Admitting: Internal Medicine

## 2021-02-03 DIAGNOSIS — I2510 Atherosclerotic heart disease of native coronary artery without angina pectoris: Secondary | ICD-10-CM

## 2021-02-09 DIAGNOSIS — H401131 Primary open-angle glaucoma, bilateral, mild stage: Secondary | ICD-10-CM | POA: Diagnosis not present

## 2021-02-09 DIAGNOSIS — H401132 Primary open-angle glaucoma, bilateral, moderate stage: Secondary | ICD-10-CM | POA: Diagnosis not present

## 2021-02-22 ENCOUNTER — Encounter: Payer: Self-pay | Admitting: Cardiology

## 2021-02-22 ENCOUNTER — Ambulatory Visit (INDEPENDENT_AMBULATORY_CARE_PROVIDER_SITE_OTHER): Payer: Medicare Other | Admitting: Cardiology

## 2021-02-22 ENCOUNTER — Other Ambulatory Visit: Payer: Self-pay

## 2021-02-22 VITALS — BP 120/80 | HR 52 | Ht 72.0 in | Wt 186.0 lb

## 2021-02-22 DIAGNOSIS — I2510 Atherosclerotic heart disease of native coronary artery without angina pectoris: Secondary | ICD-10-CM

## 2021-02-22 DIAGNOSIS — I428 Other cardiomyopathies: Secondary | ICD-10-CM

## 2021-02-22 DIAGNOSIS — Z86711 Personal history of pulmonary embolism: Secondary | ICD-10-CM | POA: Diagnosis not present

## 2021-02-22 DIAGNOSIS — I700 Atherosclerosis of aorta: Secondary | ICD-10-CM

## 2021-02-22 NOTE — Patient Instructions (Signed)
Medication Instructions:  The current medical regimen is effective;  continue present plan and medications.  *If you need a refill on your cardiac medications before your next appointment, please call your pharmacy*  Follow-Up: At [facility], you and your health needs are our priority.  As part of our continuing mission to provide you with exceptional heart care, we have created designated Provider Care Teams.  These Care Teams include your primary Cardiologist (physician) and Advanced Practice Providers (APPs -  Physician Assistants and Nurse Practitioners) who all work together to provide you with the care you need, when you need it.  We recommend signing up for the patient portal called "MyChart".  Sign up information is provided on this After Visit Summary.  MyChart is used to connect with patients for Virtual Visits (Telemedicine).  Patients are able to view lab/test results, encounter notes, upcoming appointments, etc.  Non-urgent messages can be sent to your provider as well.   To learn more about what you can do with MyChart, go to [email or web address].    Your next appointment:   1 year(s)  The format for your next appointment:   In Person  Provider:   Mark Skains, MD   Thank you for choosing [facility]!!    

## 2021-02-22 NOTE — Progress Notes (Signed)
Cardiology Office Note:    Date:  02/22/2021   ID:  Jay Day, DOB June 02, 1941, MRN [id number]  PCP:  Leeroy Cha, [place]  Cardiologist:  [name], MD  Advanced Practice Provider:  No care team member to display Electrophysiologist:  None       Referring MD: Leeroy Cha,*     History of Present Illness:    Jay Day is a 80 y.o. 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  He is here for a follow up after various episodes of syncope in June 2021.  Back in June 2021 he sustained a fall, was nauseated, briefly passed out this was while sitting on the toilet.  Vasovagal type symptoms.  Had another episode on the toilet. Bike helmet. Nausea sensation. As starting to stand up, holding on the door and lost it.  Vasovagal once again.  Today, he mentions that he is doing well and feels great.  While on Crestor, his cholesterol levels was steady at around 113. He recently started a new vegan diet and reports that his cholesterol has dropped down to 99. His PCP reduced the dosage of his statins from 10 mg to 5 mg of  Crestor. He also mentions he has lost 4-5 pounds.  He mentions still feeling lightheaded occasionally and believes it is residual effect from a previous stroke.  He will be getting a calcium scan tomorrow.  He denies chest pain, shortness of breath, palpitations, lightheadedness, headaches, syncope, LE edema, orthopnea, PND.   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   in situ-mid upper chest (txpbx)   Stroke ([place]) 2017   mild cva   Stroke [facility]) 2016    Past Surgical History:  Procedure Laterality Date   COLONOSCOPY N/A 08/22/2016   Procedure: COLONOSCOPY;  Surgeon: Garlan Fair, MD;  [facility];  Service: Endoscopy;  Laterality: N/A;   colonscopy  2007   EYE SURGERY     bilateral cataract surgery   HERNIA REPAIR  1960   HERNIA REPAIR     INGUINAL HERNIA REPAIR  08/12/2011   Procedure: LAPAROSCOPIC INGUINAL HERNIA;  Surgeon: [name],  MD;  Location: [place];  Service: General;  Laterality: Left;   KNEE SURGERY  1962   left    LEFT HEART CATHETERIZATION WITH CORONARY ANGIOGRAM N/A 05/06/2014   Procedure: LEFT HEART CATHETERIZATION WITH CORONARY ANGIOGRAM;  Surgeon: [name], MD;  Location: [facility] CATH LAB;  Service: Cardiovascular;  Laterality: N/A;   LYMPHADENECTOMY Bilateral 09/06/2019   Procedure: LYMPHADENECTOMY;  Surgeon: Alexis Frock, MD;  [facility];  Service: Urology;  Laterality: Bilateral;   ROBOT ASSISTED LAPAROSCOPIC RADICAL PROSTATECTOMY N/A 09/06/2019   Procedure: XI ROBOTIC ASSISTED LAPAROSCOPIC RADICAL PROSTATECTOMY;  Surgeon: Alexis Frock, MD;  [facility];  Service: Urology;  Laterality: N/A;  3 HRS   TONSILLECTOMY      as a child    Current Medications: Current Meds  Medication Sig   apixaban (ELIQUIS) 2.5 MG TABS tablet Take 2.5 mg by mouth 2 (two) times daily.   bromocriptine (PARLODEL) 2.5 MG tablet TAKE 1/2 TABLET BY MOUTH 5 TIMES A WEEK   dorzolamide-timolol (COSOPT) 22.3-6.8 MG/ML ophthalmic solution Place 1 drop into both eyes 2 (two) times daily.    halobetasol (ULTRAVATE) 0.05 % cream Apply to chest area daily   HYDROcodone-acetaminophen (NORCO) 5-325 MG tablet Take 1-2 tablets by mouth every 6 (six) hours as needed for moderate pain.   latanoprost (XALATAN) 0.005 % ophthalmic solution Place 1 drop into both eyes at bedtime.    rosuvastatin (CRESTOR) 5 MG tablet Take 5 mg by mouth daily.     Allergies:   Gluten meal and Wheat bran   Social History   Socioeconomic History   Marital status: Married    Spouse name: Not on file   Number of children: 2   Years of education: BA   Highest education level: Not on file  Occupational History   Occupation: [employer]  Tobacco Use   Smoking status: Former    Packs/day: 0.30    Years: 5.00    Pack years: 1.50    Types: Cigarettes    Quit date: 1968    Years since quitting: 54.7   Smokeless tobacco: Never  Vaping Use   Vaping Use: Never used  Substance and Sexual Activity   Alcohol use: Never   Drug use: Never   Sexual activity: Not on file  Other Topics Concern   Not on file  Social History Narrative   ** Merged History Encounter **       Patient drinks about 2 cups of caffeine daily. Patient is left handed.   Social Determinants of Health   Financial Resource Strain: Not on file  Food Insecurity: Not on file  Transportation Needs: Not on file  Physical Activity: Not on file  Stress: Not on file  Social Connections: Not on file     Family History: The patient's family history includes Hypertension in his mother; Kidney Stones in his mother; Kidney failure in his mother; Prostate cancer in his father; Pulmonary  embolism in his father, maternal grandfather, maternal grandmother, mother, paternal grandfather, and paternal grandmother.  ROS:   Please see the history of present illness.     All other systems reviewed and are negative.  EKGs/Labs/Other Studies Reviewed:     EKG 02/22/2021: Not done today  Echo 07/09/2019: 1. Left ventricular ejection fraction, by visual estimation, is 55 to  60%. The left ventricle has normal function. There is no left ventricular hypertrophy.   2. Definity contrast agent was given IV to delineate the left ventricular endocardial borders.   3.  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 No thrombus is seen.   4. Left ventricular diastolic parameters are consistent with Grade I diastolic dysfunction (impaired relaxation).   5. The left ventricle demonstrates regional wall motion abnormalities.   6. Global right ventricle has normal systolic function.The right  ventricular size is normal. No increase in right ventricular wall  thickness.   7. Left atrial size was normal.   8. Right atrial size was normal.   9. The mitral valve is normal in structure. Mild mitral valve  regurgitation. No evidence of mitral stenosis.  10. The tricuspid valve is normal in structure.  11. The tricuspid valve is normal in structure. Tricuspid valve  regurgitation is not demonstrated.  12. The aortic valve is normal in structure. Aortic valve regurgitation is not visualized. No evidence of aortic valve sclerosis or stenosis.  13. The pulmonic valve was normal in structure. Pulmonic valve  regurgitation is not visualized.  14. The inferior vena cava is normal in size with greater than 50% respiratory variability, suggesting right atrial pressure of 3 mmHg.  15. No significant change from prior study. The small apical aneurysm was seen on that study as well.  16. Prior images reviewed side by side.  17. No evidence of valvular vegetations on this transthoracic   echocardiogram. Would recommend a transesophageal echocardiogram to exclude infective endocarditis if clinically indicated.   Recent Labs: 05/12/2020: ALT 34  Recent Lipid Panel    Component Value Date/Time   CHOL 111 05/12/2020 0838   TRIG 65 05/12/2020 0838   HDL 53 05/12/2020 0838   CHOLHDL 2.1 05/12/2020 0838   CHOLHDL 4.0 09/14/2015 0618   VLDL 39 09/14/2015 0618   LDLCALC 44 05/12/2020 0838     Risk Assessment/Calculations:      Physical Exam:    VS:  BP 120/80 (BP Location: Left Arm, Patient Position: Sitting, Cuff Size: Normal)   Pulse (!) 52   Ht 6' (1.829 m)   Wt 186 lb (84.4 kg)   SpO2 97%   BMI 25.23 kg/m     Wt Readings from Last 3 Encounters:  02/22/21 186 lb (84.4 kg)  08/13/20 196 lb (88.9 kg)  02/26/20 189 lb 9.6 oz (86 kg)    GEN: Well nourished, well developed, in no acute distress HEENT: normal Neck: no JVD, carotid bruits, or masses Cardiac: RRR; no murmurs, rubs, or gallops,no edema  Respiratory:  clear to auscultation bilaterally, normal work of breathing GI: soft, nontender, nondistended, + BS MS: no deformity or atrophy Skin: warm and dry, no rash Neuro:  Alert and Oriented x 3, Strength and sensation are intact Psych: euthymic mood, full affect   ASSESSMENT:    1. Coronary artery disease involving native coronary artery of native heart without angina pectoris   2. NICM (nonischemic cardiomyopathy) ([place])   3. Aortic atherosclerosis ([place])   4. History of pulmonary embolism     PLAN:    In order of problems listed above:  Nonobstructive coronary artery disease -Catheterization in 2015 showed nonobstructive disease.  Continue with aggressive risk factor modification. -In fact his primary care physician ordered a coronary calcium score for him.  It will be interesting to quantify the amount of calcified plaque that is present. -Crestor 10 mg was utilized.  Recently he reduced it to 5 mg daily.   LDL at repeat was 33.   Excellent.  Nonischemic cardiomyopathy -Echo showed 45 to 50% EF in 2015.  In 2021 this was repeated  after his syncopal episode and it was 50 to 60% with LV apical aneurysm noted with no clot.  Personally reviewed echocardiogram.  Syncope, vasovagal -ZIO monitor from [facility] showed 4 beats of SVT and VT.  No adverse arrhythmias that were sustained.  Continue with hydration.  Salt liberalization.  We will stop his metoprolol 12.5 mg.  Very small dose.  Continue to hydrate well.  Be very careful.  I discussed with EP here in the office.  Classic for vasovagal.  Did not think that an implantable loop recorder would be helpful in this situation.  He is not having any syncope with any other activities. -He will continue to have some minor dizziness at times.  He has not had any frank syncope.  Stroke 2017 -No recurrence.  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  Aortic atherosclerosis -Risk factor modification.  Statin.  Follow up in 1 year   Medication Adjustments/Labs and Tests Ordered: Current medicines are reviewed at length with the patient today.  Concerns regarding medicines are outlined above.  No orders of the defined types were placed in this encounter.  No orders of the defined types were placed in this encounter.   Patient Instructions  Medication Instructions:  The current medical regimen is effective;  continue present plan and medications.  *If you need a refill on your cardiac medications before your next appointment, please call your pharmacy*  Follow-Up: At [facility], you and your health needs are our priority.  As part of our continuing mission to provide you with exceptional heart care, we have created designated Provider Care Teams.  These Care Teams include your primary Cardiologist (physician) and Advanced Practice Providers (APPs -  Physician  Assistants and Nurse Practitioners) who all work together to provide you with the care you need, when you need it.  We recommend signing up for the patient portal called "MyChart".  Sign up information is provided on this After Visit Summary.  MyChart is used to connect with patients for Virtual Visits (Telemedicine).  Patients are able to view lab/test results, encounter notes, upcoming appointments, etc.  Non-urgent messages can be sent to your provider as well.   To learn more about what you can do with MyChart, go to [email or web address].    Your next appointment:   1 year(s)  The format for your next appointment:   In Person  Provider:   [name], MD   Thank you for choosing [facility]!!      I,Zite Okoli,acting as a scribe for [name], MD.,have documented all relevant documentation on the behalf of [name], MD,as directed by  [name], MD while in the presence of [name], MD.   I, [name], MD, have reviewed all documentation for this visit. The documentation on 02/22/21 for the exam, diagnosis, procedures, and orders are all accurate and complete.   Signed, [name], MD  02/22/2021 9:45 AM    [place]

## 2021-02-23 ENCOUNTER — Ambulatory Visit
Admission: RE | Admit: 2021-02-23 | Discharge: 2021-02-23 | Disposition: A | Discharge status: Home / Self Care | Payer: Medicare Other | Source: Ambulatory Visit | Attending: Internal Medicine | Admitting: Internal Medicine

## 2021-02-23 DIAGNOSIS — I2510 Atherosclerotic heart disease of native coronary artery without angina pectoris: Secondary | ICD-10-CM

## 2021-02-24 ENCOUNTER — Other Ambulatory Visit: Payer: Self-pay | Admitting: Endocrinology

## 2021-02-25 ENCOUNTER — Other Ambulatory Visit (INDEPENDENT_AMBULATORY_CARE_PROVIDER_SITE_OTHER): Payer: Medicare Other

## 2021-02-25 ENCOUNTER — Other Ambulatory Visit: Payer: Self-pay | Admitting: Cardiology

## 2021-02-25 ENCOUNTER — Other Ambulatory Visit: Payer: Self-pay

## 2021-02-25 ENCOUNTER — Telehealth: Payer: Self-pay | Admitting: Endocrinology

## 2021-02-25 ENCOUNTER — Other Ambulatory Visit: Payer: Self-pay | Admitting: Endocrinology

## 2021-02-25 DIAGNOSIS — D352 Benign neoplasm of pituitary gland: Secondary | ICD-10-CM | POA: Diagnosis not present

## 2021-02-25 LAB — T4, FREE: Free T4: 0.82 ng/dL (ref 0.60–1.60)

## 2021-02-25 LAB — TESTOSTERONE: Testosterone: 631.36 ng/dL (ref 300.00–890.00)

## 2021-02-25 MED ORDER — BROMOCRIPTINE MESYLATE 2.5 MG PO TABS: ORAL_TABLET | ORAL | 0 refills | Status: DC

## 2021-02-25 NOTE — Telephone Encounter (Signed)
Patient is coming in at 3pm today. Can you put lab orders in.

## 2021-02-25 NOTE — Telephone Encounter (Signed)
Pt is needing a refill on bromocriptine (PARLODEL) 2.5 MG tablet   [id number] - [place], [place] - [address]

## 2021-02-25 NOTE — Telephone Encounter (Signed)
Patient has been scheduled for a follow up appointment on 03/02/2021. Please advise if okay to fill medication

## 2021-02-26 LAB — PROLACTIN: Prolactin: 4 ng/mL (ref 4.0–15.2)

## 2021-03-02 ENCOUNTER — Ambulatory Visit (INDEPENDENT_AMBULATORY_CARE_PROVIDER_SITE_OTHER): Payer: Medicare Other | Admitting: Endocrinology

## 2021-03-02 ENCOUNTER — Encounter: Payer: Self-pay | Admitting: Endocrinology

## 2021-03-02 ENCOUNTER — Other Ambulatory Visit: Payer: Self-pay

## 2021-03-02 VITALS — BP 118/72 | HR 59 | Ht 72.0 in | Wt 185.4 lb

## 2021-03-02 DIAGNOSIS — D352 Benign neoplasm of pituitary gland: Secondary | ICD-10-CM | POA: Diagnosis not present

## 2021-03-02 NOTE — Patient Instructions (Signed)
Take Parlodel 3/7 days per week

## 2021-03-02 NOTE — Progress Notes (Signed)
Patient ID: Jay Day, male   DOB: 1941/05/21, 80 y.o.   MRN: [id number]          Chief complaint:  Follow-up of prolactinoma tumor    History of Present Illness     Prior history: Apparently in 2003 he was being evaluated by his PCP for complaints of decreased libido. 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   He was treated by his internist with bromocriptine.  Because of his relatively low prolactin level with 2.5 mg of bromocriptine he was tried on half tablet in 2012 and prolactin was normal at 5.2 subsequently He had been taking 2.5 mg again for some time but his prolactin level in 11/2011 was only 2.0, in 7/14 was 1.9 and in 01/2014 his prolactin level was low at 1.6   Follow-up MRI in 04/2012 did not show any change in size of the tumor   RECENT HISTORY:  .  Patient has been treated long-term with low doses of bromocriptine   No evidence of hypopituitarism on his evaluation in 10/2014 MRI brain scan done for unrelated reasons in 06/2016 showed:    T2 hyperintense focus centered within the right aspect of the pituitary gland and cavernous sinus best appreciated on the coronal sequence is decreased in size from the 2003 pituitary MRI of the brain and probably represents residual adenoma. Description of pituitary not in MRI done in 07/2019  He has been on somewhat variable doses of bromocriptine last few years   When he was not taking bromocriptine his prolactin went up to 44 in 04/2019  Since then he had been taking bromocriptine 1.25 mg 5 days a week He takes his bromocriptine at bedtime since otherwise it makes him drowsy  No recurrence of previous symptoms of decreased libido, no fatigue  He may wake up with headache a couple of times a week in the back of the head  but no other symptoms  Prolactin now is normal again at 4, this was checked on Thursday, usually taking bromocriptine Monday through Friday  Testosterone level again normal  Free T4 has been consistently normal  Lab Results  Component Value Date   PROLACTIN 4.0 02/25/2021   PROLACTIN 13.3 02/24/2020   PROLACTIN 9.5 08/19/2019   PROLACTIN 43.6 (H) 04/24/2019   PROLACTIN 10.4 02/04/2019   PROLACTIN 4.3 08/03/2018   PROLACTIN 10.7 06/25/2018   PROLACTIN 4.4 12/19/2017    Lab Results  Component Value Date   TSH 1.29 07/10/2018   FREET4 0.82 02/25/2021   FREET4 1.04 02/04/2019   FREET4 0.92 12/19/2017   Lab Results  Component Value Date   TESTOSTERONE 631.36 02/25/2021      Allergies as of 03/02/2021       Reactions   Gluten Meal Other (See Comments)   Gluten Sensitivity (reaction undefined) NO BREAD   Wheat Bran Other (See Comments)   Reaction undefined        Medication List        Accurate as of March 02, 2021  8:39 AM. If you have any questions, ask your nurse or doctor.          apixaban 2.5 MG Tabs tablet Commonly known as: ELIQUIS Take 2.5 mg by mouth 2 (two) times daily.   bromocriptine 2.5 MG  tablet Commonly known as: PARLODEL TAKE 1/2 TABLET BY MOUTH 5 TIMES A WEEK   CO Q 10 PO Take by mouth.   dorzolamide-timolol 22.3-6.8 MG/ML ophthalmic solution Commonly known as: COSOPT Place 1 drop into both eyes 2 (two) times daily.   halobetasol 0.05 % cream Commonly known as: ULTRAVATE Apply to chest area daily   HYDROcodone-acetaminophen 5-325 MG tablet Commonly known as: Norco Take 1-2 tablets by mouth every 6 (six) hours as needed for moderate pain.   latanoprost 0.005 % ophthalmic solution Commonly known as: XALATAN Place 1 drop into both eyes at bedtime.   rosuvastatin 5 MG tablet Commonly known as: CRESTOR Take 2.5 mg by mouth daily.        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   in situ-mid upper chest (txpbx)   Stroke ([place]) 2017   mild cva   Stroke [facility]) 2016    Past Surgical History:  Procedure Laterality Date   COLONOSCOPY N/A 08/22/2016   Procedure: COLONOSCOPY;  Surgeon: Garlan Fair, MD;  [facility];  Service: Endoscopy;  Laterality: N/A;   colonscopy  2007   EYE SURGERY     bilateral cataract surgery   HERNIA REPAIR  1960   HERNIA REPAIR     INGUINAL HERNIA REPAIR  08/12/2011   Procedure: LAPAROSCOPIC INGUINAL HERNIA;  Surgeon: [name], MD;  Location: [place];  Service: General;  Laterality: Left;   [place]   left    LEFT HEART CATHETERIZATION WITH CORONARY ANGIOGRAM N/A 05/06/2014   Procedure: LEFT HEART CATHETERIZATION WITH CORONARY ANGIOGRAM;  Surgeon: [name], MD;   Location: [facility] CATH LAB;  Service: Cardiovascular;  Laterality: N/A;   LYMPHADENECTOMY Bilateral 09/06/2019   Procedure: LYMPHADENECTOMY;  Surgeon: Alexis Frock, MD;  [facility];  Service: Urology;  Laterality: Bilateral;   ROBOT ASSISTED LAPAROSCOPIC RADICAL PROSTATECTOMY N/A 09/06/2019   Procedure: XI ROBOTIC ASSISTED LAPAROSCOPIC RADICAL PROSTATECTOMY;  Surgeon: Alexis Frock, MD;  [facility];  Service: Urology;  Laterality: N/A;  3 HRS  TONSILLECTOMY     as a child    Family History  Problem Relation Age of Onset   Pulmonary embolism Mother    Kidney failure Mother    Hypertension Mother    Kidney Stones Mother    Pulmonary embolism Father    Prostate cancer Father    Pulmonary embolism Maternal Grandmother    Pulmonary embolism Maternal Grandfather    Pulmonary embolism Paternal Grandmother    Pulmonary embolism Paternal Grandfather     Social History:  reports that he quit smoking about 54 years ago. His smoking use included cigarettes. He has a 1.50 pack-year smoking history. He has never used smokeless tobacco. He reports that he does not drink alcohol and does not use drugs.  ROS  He has been off antihypertensives Cholesterol lower with vegan diet   General Examination:   BP 118/72   Pulse (!) 59   Ht 6' (1.829 m)   Wt 185 lb 6.4 oz (84.1 kg)   SpO2 96%   BMI 25.14 kg/m     Assessment/ Plan:  PROLACTINOMA: Has a history of a 1.4 cm pituitary tumor at baseline in 2003 and his initial prolactin level was high at 99  His initial symptoms were mostly related to decreased libido  Had been well controlled with low doses of bromocriptine for several years now, last follow-up a year ago Has been continued on bromocriptine 1.25 mg, taking at bedtime on Mondays through Fridays  With a trial of stopping his bromocriptine his prolactin has gone up to 43.6 compared to 10.4 Prolactin is low normal now and only 4.0  His testosterone level and free T4  again normal  Last MRI of brain did not show any gross pituitary enlargement   Since his prolactin is low normal may consider tapering off his Parlodel 1.25 mg dosage He will not take this Monday/Wednesday and Fridays only    Elayne Snare 03/02/2021, 8:39 AM

## 2021-03-09 DIAGNOSIS — Z23 Encounter for immunization: Secondary | ICD-10-CM | POA: Diagnosis not present

## 2021-03-19 DIAGNOSIS — U071 COVID-19: Secondary | ICD-10-CM | POA: Diagnosis not present

## 2021-03-20 ENCOUNTER — Emergency Department (HOSPITAL_COMMUNITY): Payer: Medicare Other

## 2021-03-20 ENCOUNTER — Observation Stay (HOSPITAL_COMMUNITY)
Admission: EM | Admit: 2021-03-20 | Discharge: 2021-03-20 | Disposition: A | Discharge status: Home / Self Care | Payer: Medicare Other | Attending: Internal Medicine | Admitting: Internal Medicine

## 2021-03-20 ENCOUNTER — Other Ambulatory Visit (HOSPITAL_COMMUNITY): Payer: Medicare Other

## 2021-03-20 DIAGNOSIS — R0902 Hypoxemia: Secondary | ICD-10-CM | POA: Diagnosis not present

## 2021-03-20 DIAGNOSIS — N1831 Chronic kidney disease, stage 3a: Secondary | ICD-10-CM | POA: Diagnosis not present

## 2021-03-20 DIAGNOSIS — S0990XA Unspecified injury of head, initial encounter: Secondary | ICD-10-CM | POA: Diagnosis not present

## 2021-03-20 DIAGNOSIS — Y92002 Bathroom of unspecified non-institutional (private) residence single-family (private) house as the place of occurrence of the external cause: Secondary | ICD-10-CM | POA: Diagnosis not present

## 2021-03-20 DIAGNOSIS — Z7901 Long term (current) use of anticoagulants: Secondary | ICD-10-CM | POA: Insufficient documentation

## 2021-03-20 DIAGNOSIS — W01198A Fall on same level from slipping, tripping and stumbling with subsequent striking against other object, initial encounter: Secondary | ICD-10-CM | POA: Diagnosis not present

## 2021-03-20 DIAGNOSIS — I82409 Acute embolism and thrombosis of unspecified deep veins of unspecified lower extremity: Secondary | ICD-10-CM | POA: Diagnosis not present

## 2021-03-20 DIAGNOSIS — I5032 Chronic diastolic (congestive) heart failure: Secondary | ICD-10-CM | POA: Insufficient documentation

## 2021-03-20 DIAGNOSIS — E785 Hyperlipidemia, unspecified: Secondary | ICD-10-CM | POA: Diagnosis present

## 2021-03-20 DIAGNOSIS — N183 Chronic kidney disease, stage 3 unspecified: Secondary | ICD-10-CM | POA: Diagnosis present

## 2021-03-20 DIAGNOSIS — R404 Transient alteration of awareness: Secondary | ICD-10-CM | POA: Diagnosis not present

## 2021-03-20 DIAGNOSIS — S199XXA Unspecified injury of neck, initial encounter: Secondary | ICD-10-CM | POA: Diagnosis not present

## 2021-03-20 DIAGNOSIS — U071 COVID-19: Secondary | ICD-10-CM | POA: Diagnosis not present

## 2021-03-20 DIAGNOSIS — R55 Syncope and collapse: Secondary | ICD-10-CM | POA: Diagnosis not present

## 2021-03-20 DIAGNOSIS — Z043 Encounter for examination and observation following other accident: Secondary | ICD-10-CM | POA: Diagnosis not present

## 2021-03-20 DIAGNOSIS — R4020 Unspecified coma: Secondary | ICD-10-CM | POA: Diagnosis not present

## 2021-03-20 DIAGNOSIS — R001 Bradycardia, unspecified: Secondary | ICD-10-CM | POA: Diagnosis not present

## 2021-03-20 LAB — COMPREHENSIVE METABOLIC PANEL
ALT: 15 U/L (ref 0–44)
AST: 26 U/L (ref 15–41)
Albumin: 3.3 g/dL — ABNORMAL LOW (ref 3.5–5.0)
Alkaline Phosphatase: 74 U/L (ref 38–126)
Anion gap: 7 (ref 5–15)
BUN: 16 mg/dL (ref 8–23)
CO2: 23 mmol/L (ref 22–32)
Calcium: 8.7 mg/dL — ABNORMAL LOW (ref 8.9–10.3)
Chloride: 107 mmol/L (ref 98–111)
Creatinine, Ser: 1.41 mg/dL — ABNORMAL HIGH (ref 0.61–1.24)
GFR, Estimated: 51 mL/min — ABNORMAL LOW (ref >60)
Glucose, Bld: 117 mg/dL — ABNORMAL HIGH (ref 70–99)
Potassium: 3.9 mmol/L (ref 3.5–5.1)
Sodium: 137 mmol/L (ref 135–145)
Total Bilirubin: 0.7 mg/dL (ref 0.3–1.2)
Total Protein: 6 g/dL — ABNORMAL LOW (ref 6.5–8.1)

## 2021-03-20 LAB — CBC WITH DIFFERENTIAL/PLATELET
Abs Immature Granulocytes: 0.04 10*3/uL (ref 0.00–0.07)
Basophils Absolute: 0.1 10*3/uL (ref 0.0–0.1)
Basophils Relative: 1 %
Eosinophils Absolute: 0.3 10*3/uL (ref 0.0–0.5)
Eosinophils Relative: 4 %
HCT: 40 % (ref 39.0–52.0)
Hemoglobin: 14.3 g/dL (ref 13.0–17.0)
Immature Granulocytes: 1 %
Lymphocytes Relative: 18 %
Lymphs Abs: 1.6 10*3/uL (ref 0.7–4.0)
MCH: 37.3 pg — ABNORMAL HIGH (ref 26.0–34.0)
MCHC: 35.8 g/dL (ref 30.0–36.0)
MCV: 104.4 fL — ABNORMAL HIGH (ref 80.0–100.0)
Monocytes Absolute: 1.3 10*3/uL — ABNORMAL HIGH (ref 0.1–1.0)
Monocytes Relative: 15 %
Neutro Abs: 5.6 10*3/uL (ref 1.7–7.7)
Neutrophils Relative %: 61 %
Platelets: DECREASED 10*3/uL (ref 150–400)
RBC: 3.83 MIL/uL — ABNORMAL LOW (ref 4.22–5.81)
RDW: 12.5 % (ref 11.5–15.5)
Smear Review: DECREASED
WBC: 8.8 10*3/uL (ref 4.0–10.5)
nRBC: 0 % (ref 0.0–0.2)

## 2021-03-20 LAB — RESP PANEL BY RT-PCR (FLU A&B, COVID) ARPGX2
Influenza A by PCR: NEGATIVE
Influenza B by PCR: NEGATIVE
SARS Coronavirus 2 by RT PCR: POSITIVE — AB

## 2021-03-20 LAB — TROPONIN I (HIGH SENSITIVITY)
Troponin I (High Sensitivity): 8 ng/L (ref <18)
Troponin I (High Sensitivity): 10 ng/L (ref <18)

## 2021-03-20 MED ORDER — GUAIFENESIN 100 MG/5ML PO SOLN: 5.0000 mL | ORAL | 0 refills | Status: DC | PRN

## 2021-03-20 MED ORDER — APIXABAN 2.5 MG PO TABS
2.5000 mg | ORAL_TABLET | Freq: Two times a day (BID) | ORAL | Status: DC
  Administered 2021-03-20: 2.5 mg via ORAL
  Filled 2021-03-20: qty 1

## 2021-03-20 MED ORDER — ALBUTEROL SULFATE HFA 108 (90 BASE) MCG/ACT IN AERS: 1.0000 | INHALATION_SPRAY | RESPIRATORY_TRACT | Status: DC | PRN

## 2021-03-20 MED ORDER — ACETAMINOPHEN 325 MG PO TABS: 650.0000 mg | ORAL_TABLET | Freq: Four times a day (QID) | ORAL | Status: DC | PRN

## 2021-03-20 MED ORDER — BENZONATATE 100 MG PO CAPS: 100.0000 mg | ORAL_CAPSULE | Freq: Three times a day (TID) | ORAL | 0 refills | Status: AC | PRN

## 2021-03-20 MED ORDER — ROSUVASTATIN CALCIUM 5 MG PO TABS
2.5000 mg | ORAL_TABLET | Freq: Every day | ORAL | Status: DC
  Administered 2021-03-20: 2.5 mg via ORAL
  Filled 2021-03-20: qty 1

## 2021-03-20 MED ORDER — ACETAMINOPHEN 650 MG RE SUPP: 650.0000 mg | Freq: Four times a day (QID) | RECTAL | Status: DC | PRN

## 2021-03-20 MED ORDER — POTASSIUM CHLORIDE CRYS ER 20 MEQ PO TBCR
20.0000 meq | EXTENDED_RELEASE_TABLET | Freq: Once | ORAL | Status: AC
  Administered 2021-03-20: 20 meq via ORAL
  Filled 2021-03-20: qty 1

## 2021-03-20 MED ORDER — SODIUM CHLORIDE 0.9 % IV BOLUS
1500.0000 mL | Freq: Once | INTRAVENOUS | Status: AC
  Administered 2021-03-20: 1500 mL via INTRAVENOUS

## 2021-03-20 NOTE — ED Provider Notes (Signed)
[facility] EMERGENCY DEPARTMENT Provider Note   CSN: [id number] Arrival date & time: 03/20/21  0145     History No chief complaint on file.   Jay Day is a 80 y.o. male.  Patient presents via EMS as a level 2 trauma with a fall.  States there was an unwitnessed fall in the bathroom.  EMS reports toilet broke in half.  Wife states patient hit his head on the sink.  Patient states he remembers going to the bathroom to have a bowel movement and next thing he knows he was "unsteady on the toilet".  Denies any preceding dizziness and lightheadedness however.  EMS found him to be sitting on the toilet and he had an episode of bradycardia with loss of consciousness water checking vital signs.  They report he was unresponsive for 30 to 40 seconds.  Heart rate was down to the 40s.  Patient does not recall this episode.  There is no tongue biting or incontinence.  No seizure activity.  He is reportedly passed out in the past.  He does take Xarelto for history of atrial fibrillation.  EMS reports that he was unresponsive for several seconds while they checked his vital signs.  Patient recalls going to the bathroom to have a bowel movement but then does not know what happened after this. He denies chest pain or shortness of breath.  Denies abdominal pain.  Denies any head, neck, back, chest pain.  No focal weakness, numbness or tingling  Patient's wife has arrived.  She states she heard a crash in the bathroom and patient was slumped against the sink with low broken toilet tank.  He was slow to respond and confused.  On EMS arrival patient had another syncopal episode where he lost consciousness for 30 to 40 seconds and was "very pale and eyes were closed" which is different than his other episodes. No seizure-like activity.  Patient was found to be COVID-positive 2 days ago  The history is provided by the patient and the EMS personnel. The history is limited by the condition of  the patient.      No past medical history on file.  There are no problems to display for this patient.   *The histories are not reviewed yet. Please review them in the "History" navigator section and refresh this [place].     No family history on file.     Home Medications Prior to Admission medications   Not on File    Allergies    Patient has no allergy information on record.  Review of Systems   Review of Systems  Constitutional:  Negative for activity change, appetite change and fever.  HENT:  Negative for congestion and rhinorrhea.   Respiratory:  Negative for cough, chest tightness and shortness of breath.   Cardiovascular:  Negative for chest pain.  Gastrointestinal:  Negative for abdominal pain, nausea and vomiting.  Genitourinary:  Negative for dysuria and hematuria.  Musculoskeletal:  Negative for arthralgias and myalgias.  Skin:  Negative for rash.  Neurological:  Positive for syncope. Negative for dizziness, light-headedness and headaches.   all other systems are negative except as noted in the HPI and PMH.   Physical Exam Updated Vital Signs BP 120/70   Pulse (!) 57   Temp 97.6 F (36.4 C) (Oral)   Resp 14   Ht 6' (1.829 m)   Wt 82.6 kg   SpO2 95%   BMI 24.68 kg/m   Physical Exam Vitals  and nursing note reviewed.  Constitutional:      General: He is not in acute distress.    Appearance: He is well-developed.  HENT:     Head: Normocephalic and atraumatic.     Mouth/Throat:     Pharynx: No oropharyngeal exudate.  Eyes:     Conjunctiva/sclera: Conjunctivae normal.     Pupils: Pupils are equal, round, and reactive to light.  Neck:     Comments: C-collar in place, no step-off Cardiovascular:     Rate and Rhythm: Normal rate and regular rhythm.     Heart sounds: Normal heart sounds. No murmur heard. Pulmonary:     Effort: Pulmonary effort is normal. No respiratory distress.     Breath sounds: Normal breath sounds.  Abdominal:      Palpations: Abdomen is soft.     Tenderness: There is no abdominal tenderness. There is no guarding or rebound.  Musculoskeletal:        General: No tenderness. Normal range of motion.     Cervical back: Normal range of motion and neck supple.     Comments: No T or L-spine tenderness, full range of motion of hips bilateral  Skin:    General: Skin is warm.  Neurological:     Mental Status: He is alert and oriented to person, place, and time.     Cranial Nerves: No cranial nerve deficit.     Motor: No abnormal muscle tone.     Coordination: Coordination normal.     Comments: No ataxia on finger to nose bilaterally. No pronator drift. 5/5 strength throughout. CN 2-12 intact.Equal grip strength. Sensation intact.   Psychiatric:        Behavior: Behavior normal.    ED Results / Procedures / Treatments   Labs (all labs ordered are listed, but only abnormal results are displayed) Labs Reviewed  RESP PANEL BY RT-PCR (FLU A&B, COVID) ARPGX2 - Abnormal; Notable for the following components:      Result Value   SARS Coronavirus 2 by RT PCR POSITIVE (*)    All other components within normal limits  CBC WITH DIFFERENTIAL/PLATELET - Abnormal; Notable for the following components:   RBC 3.83 (*)    MCV 104.4 (*)    MCH 37.3 (*)    Monocytes Absolute 1.3 (*)    All other components within normal limits  COMPREHENSIVE METABOLIC PANEL - Abnormal; Notable for the following components:   Glucose, Bld 117 (*)    Creatinine, Ser 1.41 (*)    Calcium 8.7 (*)    Total Protein 6.0 (*)    Albumin 3.3 (*)    GFR, Estimated 51 (*)    All other components within normal limits  MAGNESIUM  C-REACTIVE PROTEIN  D-DIMER, QUANTITATIVE  FERRITIN  FIBRINOGEN  LACTATE DEHYDROGENASE  PROCALCITONIN  TROPONIN I (HIGH SENSITIVITY)  TROPONIN I (HIGH SENSITIVITY)    EKG EKG Interpretation  Date/Time:  Saturday March 20 2021 04:33:14 EDT Ventricular Rate:  55 PR Interval:  213 QRS Duration: 95 QT  Interval:  425 QTC Calculation: 407 R Axis:   55 Text Interpretation: Sinus rhythm Atrial premature complex Borderline prolonged PR interval No significant change was found Confirmed by Ezequiel Essex [phone]) on 03/20/2021 4:38:17 AM  Radiology CT Head Wo Contrast  Result Date: 03/20/2021 CLINICAL DATA:  Head trauma, mod-severe; Polytrauma, critical, head/C-spine injury suspected. Unwitnessed fall, head injury, chronic anticoagulation EXAM: CT HEAD WITHOUT CONTRAST CT CERVICAL SPINE WITHOUT CONTRAST TECHNIQUE: Multidetector CT imaging of the head and cervical  spine was performed following the standard protocol without intravenous contrast. Multiplanar CT image reconstructions of the cervical spine were also generated. COMPARISON:  12/04/2019 FINDINGS: CT HEAD FINDINGS Brain: Normal anatomic configuration. Parenchymal volume loss is commensurate with the patient's age. Mild periventricular white matter changes are present likely reflecting the sequela of small vessel ischemia. 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 No abnormal intra or extra-axial mass lesion or fluid collection. No abnormal mass effect or midline shift. No evidence of acute intracranial hemorrhage or infarct. Ventricular size is normal. Cerebellum unremarkable. Vascular: No asymmetric hyperdense vasculature at the skull base. Skull: Intact Sinuses/Orbits: There is mild mucosal thickening within the frontal and visualized maxillary sinuses as well as opacification of several ethmoid air cells bilaterally. No air-fluid levels. Orbits are unremarkable. Other: Mastoid air cells and middle ear cavities are clear. CT CERVICAL SPINE FINDINGS Alignment: Normal cervical lordosis. 2 mm anterolisthesis of C3 upon C4 and C4 upon C5 as well as 2 mm retrolisthesis of C5 upon C6 is likely degenerative in nature. Skull base and vertebrae: Craniocervical alignment is normal. The atlantodental interval is not widened. There is no  acute fracture of the cervical spine. A linear lucency is seen within the a left inferior articular facet of C5 on sagittal imaging which does not appear to extend to the cortex bilaterally and likely represents a vascular groove. No lytic or blastic bone lesion. Soft tissues and spinal canal: No prevertebral fluid or swelling. No visible canal hematoma. Disc levels: Intervertebral disc space narrowing and endplate remodeling is seen of C4-C7 in keeping with changes of moderate degenerative disc disease. Vertebral body height has been preserved. The prevertebral soft tissues are not thickened on sagittal reformats. Multilevel uncovertebral and facet arthrosis results in multilevel mild-to-moderate neuroforaminal narrowing, most severe at C5-6 bilaterally. No significant canal stenosis. Upper chest: Unremarkable Other: None IMPRESSION: No acute intracranial injury.  No calvarial fracture. No acute fracture or listhesis of the cervical spine. Electronically Signed   By: Fidela Salisbury M.D.   On: 03/20/2021 02:45   CT Cervical Spine Wo Contrast  Result Date: 03/20/2021 CLINICAL DATA:  Head trauma, mod-severe; Polytrauma, critical, head/C-spine injury suspected. Unwitnessed fall, head injury, chronic anticoagulation EXAM: CT HEAD WITHOUT CONTRAST CT CERVICAL SPINE WITHOUT CONTRAST TECHNIQUE: Multidetector CT imaging of the head and cervical spine was performed following the standard protocol without intravenous contrast. Multiplanar CT image reconstructions of the cervical spine were also generated. COMPARISON:  12/04/2019 FINDINGS: CT HEAD FINDINGS Brain: Normal anatomic configuration. Parenchymal volume loss is commensurate with the patient's age. Mild periventricular white matter changes are present likely reflecting the sequela of small vessel ischemia. 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 No abnormal intra or extra-axial mass lesion or fluid collection. No abnormal mass effect or  midline shift. No evidence of acute intracranial hemorrhage or infarct. Ventricular size is normal. Cerebellum unremarkable. Vascular: No asymmetric hyperdense vasculature at the skull base. Skull: Intact Sinuses/Orbits: There is mild mucosal thickening within the frontal and visualized maxillary sinuses as well as opacification of several ethmoid air cells bilaterally. No air-fluid levels. Orbits are unremarkable. Other: Mastoid air cells and middle ear cavities are clear. CT CERVICAL SPINE FINDINGS Alignment: Normal cervical lordosis. 2 mm anterolisthesis of C3 upon C4 and C4 upon C5 as well as 2 mm retrolisthesis of C5 upon C6 is likely degenerative in nature. Skull base and vertebrae: Craniocervical alignment is normal. The atlantodental interval is not widened. There is no acute fracture of  the cervical spine. A linear lucency is seen within the a left inferior articular facet of C5 on sagittal imaging which does not appear to extend to the cortex bilaterally and likely represents a vascular groove. No lytic or blastic bone lesion. Soft tissues and spinal canal: No prevertebral fluid or swelling. No visible canal hematoma. Disc levels: Intervertebral disc space narrowing and endplate remodeling is seen of C4-C7 in keeping with changes of moderate degenerative disc disease. Vertebral body height has been preserved. The prevertebral soft tissues are not thickened on sagittal reformats. Multilevel uncovertebral and facet arthrosis results in multilevel mild-to-moderate neuroforaminal narrowing, most severe at C5-6 bilaterally. No significant canal stenosis. Upper chest: Unremarkable Other: None IMPRESSION: No acute intracranial injury.  No calvarial fracture. No acute fracture or listhesis of the cervical spine. Electronically Signed   By: Fidela Salisbury M.D.   On: 03/20/2021 02:45   DG Pelvis Portable  Result Date: 03/20/2021 CLINICAL DATA:  Syncope, fall EXAM: PORTABLE PELVIS 1-2 VIEWS COMPARISON:  None.  FINDINGS: There is no evidence of pelvic fracture or diastasis. No pelvic bone lesions are seen. Left inguinal hernia repair with mesh has been performed. IMPRESSION: Negative. Electronically Signed   By: Fidela Salisbury M.D.   On: 03/20/2021 02:50   DG Chest Portable 1 View  Result Date: 03/20/2021 CLINICAL DATA:  Syncope EXAM: PORTABLE CHEST 1 VIEW COMPARISON:  07/05/2019 FINDINGS: The heart size and mediastinal contours are within normal limits. Both lungs are clear. The visualized skeletal structures are unremarkable. IMPRESSION: No active disease. Electronically Signed   By: Fidela Salisbury M.D.   On: 03/20/2021 02:48    Procedures Procedures   Medications Ordered in ED Medications - No data to display  ED Course  I have reviewed the triage vital signs and the nursing notes.  Pertinent labs & imaging results that were available during my care of the patient were reviewed by me and considered in my medical decision making (see chart for details).  Patient has been seen by Cardiology for syncopal episodes in past. Per Dr. Marlou Porch 9/22: Syncope, vasovagal -ZIO monitor from [facility] showed 4 beats of SVT and VT.  No adverse arrhythmias that were sustained.  Continue with hydration.  Salt liberalization.  We will stop his metoprolol 12.5 mg.  Very small dose.  Continue to hydrate well.  Be very careful.  I discussed with EP here in the office.  Classic for vasovagal.  Did not think that an implantable loop recorder would be helpful in this situation.  He is not having any syncope with any other activities. -He will continue to have some minor dizziness at times.  He has not had any frank syncope.     MDM Rules/Calculators/A&P                           Fall with head injury.  Possible loss of consciousness. Did hit his head but denies any head, neck, back, chest or abdominal pain.  No shortness of breath.  No preceding prodrome for syncope.  Patient with previous syncope work-up as above last  year.  States his metoprolol was stopped and he was placed on fluid and salt liberalization.  GCS is 15, ABCs are intact.  EKG is sinus rhythm with some PACs.  Traumatic imaging is negative.  Patient feels well.  Labs are reassuring.  EKG shows no Brugada no prolonged QT.  Concern for recurrent syncope.  Patient with history of same with  previous work-up showing nonsustained VT that was very brief.  Was thought not to be a candidate for loop recorder.  Patient somewhat unsteady on his feet but able to ambulate.  Feels back to baseline.  Denies any chest pain or shortness of breath.  There is some concern for recurrent episodes of syncope possibly cardiogenic.  Will benefit from updating echocardiogram.  He did have 2 episodes of syncope without prodrome today with significant mechanism for injury.  He is anticoagulated. Thankfully his traumatic imaging is negative for significant injury today.  Plan observation admission given his recurrent episodes of syncope.  He is COVID-positive as expected.  No hypoxia or increased work of breathing.  He declines antiviral treatment.\\Discussed with Dr. Velia Meyer.    Jay Day was evaluated in Emergency Department on 03/20/2021 for the symptoms described in the history of present illness. He was evaluated in the context of the global COVID-19 pandemic, which necessitated consideration that the patient might be at risk for infection with the SARS-CoV-2 virus that causes COVID-19. Institutional protocols and algorithms that pertain to the evaluation of patients at risk for COVID-19 are in a state of rapid change based on information released by regulatory bodies including the CDC and federal and state organizations. These policies and algorithms were followed during the patient's care in the ED.  Final Clinical Impression(s) / ED Diagnoses Final diagnoses:  None    Rx / DC Orders ED Discharge Orders     None        Annalaya Wile, Annie Main,  MD 03/20/21 [phone]

## 2021-03-20 NOTE — H&P (Signed)
History and Physical    PLEASE NOTE THAT DRAGON DICT[employer] SOFTWARE WAS USED IN THE CONSTRUCTION OF THIS NOTE.   [name] DOB: 08/28/1940 DOA: 03/20/2021  PCP: Leeroy Cha, MD Patient coming from: home   I have personally briefly reviewed patient's old medical records in [place]  Chief Complaint: Syncope  HPI: Jay Day is a 80 y.o. 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   The patient sat down on the for a bowel movement earlier this evening, and then on the bathroom floor with his wife and EMS standing over him, with the patient having lost consciousness for approximately 45 seconds per the patient's wife.  While wife did not directly witness the initial onset of loss of consciousness, she was a 1 room over at the time, and heard a loud crashing sound coming from the bathroom, prompting her to enter the bathroom and found the patient to be unconscious on the bathroom floor, before regaining consciousness approximately 30 to 45 seconds later in the absence of any confusion or diminished responsiveness upon regaining consciousness.  Episode is not associated with any tonic-clonic activity nor any tongue biting or any loss of bowel/bladder function.  While evaluating the patient, EMS noted the patient once again lose consciousness in similar fashion, with this episode also lasting approximately 45 seconds.  The patient reports that neither of these episodes were associated with any preceding sensation of dizziness, lightheadedness, diaphoresis, or subjective sensation of impending loss of consciousness.  It appears that the patient hit his head on the toilet  has a component of the above initial syncopal episode.  As it relates to syncope, the patient denies any recent, immediately preceding, or ensuing chest pain, sob, palpitations, diaphoresis, nausea, vomiting dizziness.  It appears that the patient also experienced syncope in 2021, at which time an associated evaluation included cardiac event monitor.  This episode was not associated with any acute focal weakness, acute focal numbness, paresthesias, facial droop, slurred speech, expressive aphasia, acute change in vision, dysphagia, vertigo.  Denies any associated or ensuing headache or neck pain. Denies any additional resultant acute arthralgias or myalgias.   He reports mild, nonproductive cough associated with rhinitis over the last 2 days.  Denies any associated shortness of breath.  He also denies any subjective fever, chills, rigors, or generalized myalgias.  No recent worsening of peripheral edema, new calf tenderness, or new lower extremity erythema.  He also denies any recent dysuria, gross hematuria, or change in urinary urgency/frequency.  He confirms that he is on no AV nodal blocking agents at home.  In the setting of a history of recurrent DVT/PEs, the patient reports outstanding compliance with the chronic anticoagulation on Eliquis 2.5 mg p.o. twice daily.  Most recent echocardiogram occurred in January 2021 and was notable for LVEF 55 to 60%, no evidence of LVH, while showing grade 1 diastolic dysfunction and mild mitral regurgitation.  Not on any diuretic medications at home.    ED Course:  Vital signs in the ED were notable for the following:  - Afebrile; heart rate 53-62; blood pressure 106/74 120/70; respiratory rate 14-18, oxygen saturation 95 to 97% on room air.  Labs were notable for the following: CMP notable for the  following: Sodium 137, potassium 3.9, bicarbonate 23, creatinine 1.41 relative to most recent prior value of 1.48 in June 2021.  High-sensitivity troponin I  initially noted to be 8, previous value trending up slightly to 10.  CBC notable for white cell count of 800, hemoglobin 14 point 0.3.  COVID-19 PCR performed in the ED this evening was found to be positive, will and influenza PCR was noted to be negative.  Imaging and additional notable ED work-up: EKG showed sinus bradycardia with borderline prolonged PR of 213, with heart rate 55, no evidence of T wave or ST changes, including no evidence of ST elevation.  In the setting of fall associate with presenting syncope, and concern that the patient is head is component of this fall, CT head showed no evidence of acute intracranial process, including no evidence of intracranial hemorrhage, while CT cervical spine showed no evidence of acute fracture or listhesis of the cervical spine.  Plain films of the pelvis demonstrated no evidence of acute process including no evidence of acute fracture.  Chest x-ray showed no evidence of acute cardiopulmonary process, including no acute's of infiltrate or edema, effusion, or pneumothorax.  Subsequently, the patient was admitted for overnight observation for further evaluation and management of presenting 2 episodes of syncope in the absence of prodrome.    Review of Systems: As per HPI otherwise 10 point review of systems negative.   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   Social History: Former smoker, having completely quit smoking in 1968.  Denies any history of alcohol consumption or use of recreational drugs.  Surgical history, denies prior surgeries.  Family history reviewed and not pertinent    Outpatient medications: Eliquis 2.5 mg p.o. twice daily, rosuvastatin 2.5 mg p.o. daily.  Objective    Physical Exam: Vitals:   03/20/21 0400 03/20/21 0430 03/20/21 0500 03/20/21 0530  BP: 121/65 111/77 106/74 95/74  Pulse: (!) 54 (!) 54  (!) 52 (!) 52  Resp: _0 Temp:      TempSrc:      SpO2: 95% 95% 95% 95%  Weight:      Height:        General: appears to be stated age; alert, oriented Skin: warm, dry, no rash Head:  AT/[place] Mouth:  Oral mucosa membranes appear moist, normal dentition Neck: supple; trachea midline Heart:  RRR; did not appreciate any M/R/G Lungs: CTAB, did not appreciate any wheezes, rales, or rhonchi Abdomen: + BS; soft, ND, NT Vascular: 2+ pedal pulses b/l; 2+ radial pulses b/l Extremities: no peripheral edema, no muscle wasting Neuro: strength and sensation intact in upper and lower extremities b/l    Labs on Admission: I have personally reviewed following labs and imaging studies  CBC: Recent Labs  Lab 03/20/21 0154  WBC 8.8  NEUTROABS 5.6  HGB 14.3  HCT 40.0  MCV 104.4*  PLT PLATELET CLUMPS NOTED ON SMEAR, COUNT APPEARS DECREASED   Basic Metabolic Panel: Recent Labs  Lab 03/20/21 0154  NA 137  K 3.9  CL 107  CO2 23  GLUCOSE 117*  BUN 16  CREATININE 1.41*  CALCIUM 8.7*   GFR: Estimated Creatinine Clearance: 47.4 mL/min (A) (by C-G formula based on SCr of 1.41 mg/dL (H)). Liver Function Tests: Recent Labs  Lab 03/20/21 0154  AST 26  ALT 15  ALKPHOS 74  BILITOT 0.7  PROT 6.0*  ALBUMIN 3.3*   No results  for input(s): LIPASE, AMYLASE in the last 168 hours. No results for input(s): AMMONIA in the last 168 hours. Coagulation Profile: No results for input(s): INR, PROTIME in the last 168 hours. Cardiac Enzymes: No results for input(s): CKTOTAL, CKMB, CKMBINDEX, TROPONINI in the last 168 hours. BNP (last 3 results) No results for input(s): PROBNP in the last 8760 hours. HbA1C: No results for input(s): HGBA1C in the last 72 hours. CBG: No results for input(s): GLUCAP in the last 168 hours. Lipid Profile: No results for input(s): CHOL, HDL, LDLCALC, TRIG, CHOLHDL, LDLDIRECT in the last 72 hours. Thyroid Function Tests: No results for input(s): TSH,  T4TOTAL, FREET4, T3FREE, THYROIDAB in the last 72 hours. Anemia Panel: No results for input(s): VITAMINB12, FOLATE, FERRITIN, TIBC, IRON, RETICCTPCT in the last 72 hours. Urine analysis: No results found for: COLORURINE, APPEARANCEUR, [place], [place], GLUCOSEU, HGBUR, BILIRUBINUR, KETONESUR, PROTEINUR, UROBILINOGEN, NITRITE, LEUKOCYTESUR  Radiological Exams on Admission: CT Head Wo Contrast  Result Date: 03/20/2021 CLINICAL DATA:  Head trauma, mod-severe; Polytrauma, critical, head/C-spine injury suspected. Unwitnessed fall, head injury, chronic anticoagulation EXAM: CT HEAD WITHOUT CONTRAST CT CERVICAL SPINE WITHOUT CONTRAST TECHNIQUE: Multidetector CT imaging of the head and cervical spine was performed following the standard protocol without intravenous contrast. Multiplanar CT image reconstructions of the cervical spine were also generated. COMPARISON:  12/04/2019 FINDINGS: CT HEAD FINDINGS Brain: Normal anatomic configuration. Parenchymal volume loss is commensurate with the patient's age. Mild periventricular white matter changes are present likely reflecting the sequela of small vessel ischemia. 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 No abnormal intra or extra-axial mass lesion or fluid collection. No abnormal mass effect or midline shift. No evidence of acute intracranial hemorrhage or infarct. Ventricular size is normal. Cerebellum unremarkable. Vascular: No asymmetric hyperdense vasculature at the skull base. Skull: Intact Sinuses/Orbits: There is mild mucosal thickening within the frontal and visualized maxillary sinuses as well as opacification of several ethmoid air cells bilaterally. No air-fluid levels. Orbits are unremarkable. Other: Mastoid air cells and middle ear cavities are clear. CT CERVICAL SPINE FINDINGS Alignment: Normal cervical lordosis. 2 mm anterolisthesis of C3 upon C4 and C4 upon C5 as well as 2 mm retrolisthesis of C5 upon C6 is likely degenerative  in nature. Skull base and vertebrae: Craniocervical alignment is normal. The atlantodental interval is not widened. There is no acute fracture of the cervical spine. A linear lucency is seen within the a left inferior articular facet of C5 on sagittal imaging which does not appear to extend to the cortex bilaterally and likely represents a vascular groove. No lytic or blastic bone lesion. Soft tissues and spinal canal: No prevertebral fluid or swelling. No visible canal hematoma. Disc levels: Intervertebral disc space narrowing and endplate remodeling is seen of C4-C7 in keeping with changes of moderate degenerative disc disease. Vertebral body height has been preserved. The prevertebral soft tissues are not thickened on sagittal reformats. Multilevel uncovertebral and facet arthrosis results in multilevel mild-to-moderate neuroforaminal narrowing, most severe at C5-6 bilaterally. No significant canal stenosis. Upper chest: Unremarkable Other: None IMPRESSION: No acute intracranial injury.  No calvarial fracture. No acute fracture or listhesis of the cervical spine. Electronically Signed   By: Fidela Salisbury M.D.   On: 03/20/2021 02:45   CT Cervical Spine Wo Contrast  Result Date: 03/20/2021 CLINICAL DATA:  Head trauma, mod-severe; Polytrauma, critical, head/C-spine injury suspected. Unwitnessed fall, head injury, chronic anticoagulation EXAM: CT HEAD WITHOUT CONTRAST CT CERVICAL SPINE WITHOUT CONTRAST TECHNIQUE: Multidetector CT imaging of the head and  cervical spine was performed following the standard protocol without intravenous contrast. Multiplanar CT image reconstructions of the cervical spine were also generated. COMPARISON:  12/04/2019 FINDINGS: CT HEAD FINDINGS Brain: Normal anatomic configuration. Parenchymal volume loss is commensurate with the patient's age. Mild periventricular white matter changes are present likely reflecting the sequela of small vessel ischemia. Remote left periventricular white  matter infarct is again noted. No abnormal intra or extra-axial mass lesion or fluid collection. No abnormal mass effect or midline shift. No evidence of acute intracranial hemorrhage or infarct. Ventricular size is normal. Cerebellum unremarkable. Vascular: No asymmetric hyperdense vasculature at the skull base. Skull: Intact Sinuses/Orbits: There is mild mucosal thickening within the frontal and visualized maxillary sinuses as well as opacification of several ethmoid air cells bilaterally. No air-fluid levels. Orbits are unremarkable. Other: Mastoid air cells and middle ear cavities are clear. CT CERVICAL SPINE FINDINGS Alignment: Normal cervical lordosis. 2 mm anterolisthesis of C3 upon C4 and C4 upon C5 as well as 2 mm retrolisthesis of C5 upon C6 is likely degenerative in nature. Skull base and vertebrae: Craniocervical alignment is normal. The atlantodental interval is not widened. There is no acute fracture of the cervical spine. A linear lucency is seen within the a left inferior articular facet of C5 on sagittal imaging which does not appear to extend to the cortex bilaterally and likely represents a vascular groove. No lytic or blastic bone lesion. Soft tissues and spinal canal: No prevertebral fluid or swelling. No visible canal hematoma. Disc levels: Intervertebral disc space narrowing and endplate remodeling is seen of C4-C7 in keeping with changes of moderate degenerative disc disease. Vertebral body height has been preserved. The prevertebral soft tissues are not thickened on sagittal reformats. Multilevel uncovertebral and facet arthrosis results in multilevel mild-to-moderate neuroforaminal narrowing, most severe at C5-6 bilaterally. No significant canal stenosis. Upper chest: Unremarkable Other: None IMPRESSION: No acute intracranial injury.  No calvarial fracture. No acute fracture or listhesis of the cervical spine. Electronically Signed   By: Fidela Salisbury M.D.   On: 03/20/2021 02:45   DG  Pelvis Portable  Result Date: 03/20/2021 CLINICAL DATA:  Syncope, fall EXAM: PORTABLE PELVIS 1-2 VIEWS COMPARISON:  None. FINDINGS: There is no evidence of pelvic fracture or diastasis. No pelvic bone lesions are seen. Left inguinal hernia repair with mesh has been performed. IMPRESSION: Negative. Electronically Signed   By: Fidela Salisbury M.D.   On: 03/20/2021 02:50   DG Chest Portable 1 View  Result Date: 03/20/2021 CLINICAL DATA:  Syncope EXAM: PORTABLE CHEST 1 VIEW COMPARISON:  07/05/2019 FINDINGS: The heart size and mediastinal contours are within normal limits. Both lungs are clear. The visualized skeletal structures are unremarkable. IMPRESSION: No active disease. Electronically Signed   By: Fidela Salisbury M.D.   On: 03/20/2021 02:48     EKG: Independently reviewed, with result as described above.    Assessment/Plan   Jay Day is a 80 y.o. 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    Principal Problem:   Syncope Active Problems:   COVID-19 virus infection   HLD (hyperlipidemia)   Chronic diastolic CHF (congestive heart failure) (HCC)   CKD (chronic kidney disease) stage 3, GFR 30-59 ml/min (HCC)    #) Syncope: 2 episode  of syncope without any associated prodrome.  Considered the possibility of neurocardiogenic syncope given proximity to bowel movement, however the absence of associated prodrome appears to render this possibility to be less likely.  Regardless, will check orthostatic vital signs.  No overt evidence of ACS at this time, including no associated chest pain, EKG shows no evidence of acute disc changes and high-sensitivity troponin I x2 values nonelevated.  However, we  will further trend serial troponin to further evaluate.  In the context of no associated prodrome, differential also includes ventricular arrhythmia.  We will closely monitor.  Additionally, we will optimize serum potassium level potassium chloride 20 mEq p.o. x1 in order to maintain this value at goal of greater than or equal to 4.0 to reduce risk for ventricular arrhythmia.  Differential also symptomatic bradycardia.   Not associated with any overt acute focal neurologic deficits. Clinically, acute ischemic stroke versus seizures appear less likely at this time.      Plan: I have placed a nursing communication order requesting that orthostatic vital signs x 1 set be checked and documented.   monitor strict I's and O's.  Add-on serum Mg level. Check CMP, CBC, serum Mg level in the AM. Fall precautions ordered.  Potassium chloride 20 mEq p.o. x1 dose now, as above.  Echocardiogram ordered for the morning.  Further trending of serial troponin.  Monitor on telemetry.      #) COVID-19 infection: diagnosis on the basis of: Patient's report of 2 days of new onset nonproductive cough associated with rhinitis in the absence of any associated shortness of breath. Of note, presentation does not appear to be associated with acute hypoxic respiratory distress/failure, with patient maintaining O2 sats greater than 94% on room air. Overall, it does not appear that criteria are met at the present time for patient's COVID-19 infection to be considered severe in nature. Consequently, there does not appear to be an indication at this time for initiation of systemic corticosteroids per treatment guidance recommendations from [facility]'s Covid Treatment Guidelines.   While criteria are met on the basis of patient's age greater than 28 as well as comorbidities for him to be considered high risk for increased probability of complicated clinical course of COVID-19 infection, it is noted that the patient is not being  admitted specifically for COVID-19 infection, but rather aforementioned 2 episodes of syncope.  Rather, presenting COVID-19 positive finding is an incidental one, and therefore criteria for remdesivir not met. However, in setting of incidental COVID-19 infection with symptoms starting less than 5 days ago in this high risk patient, criteria would otherwise be met for consideration for initiation of paxlovid.  However, in the context of his chronic anticoagulation on Eliquis 2.5 mg p.o. twice daily, there is an associated contraindication for initiation of such. Rather, will closely monitor and provide ensuing supportive care, as further detailed below.  Of note, no known history of underlying diabetes, no history of chronic underlying pulmonary conditions.    Plan: Airborne and contact precautions. Monitor continuous pulse oximetry and monitor on telemetry. prn supplemental O2 to maintain O2 sats greater than or equal to 94%. Proning protocol initiated. PRN albuterol inhaler.  PRN acetaminophen for fever. Refraining from initiation of systemic corticosteroids, remdesivir, and paxlovid as above. Check inflammatory markers (fibrinogen, d dimer or fibrin derivatives, crp, ferritin, LDH) in the morning. Check serum magnesium. Check CMP and CBC in the morning. Flutter valve and incentive spirometry.  Check procalcitonin.      #) Recurrent DVT/pulmonary  emboli: Chronic anticoagulated on Eliquis, with which the patient reports outstanding compliance.   Plan: Continue home Eliquis.  Repeat CBC in the morning.      #) Hyperlipidemia: Documented history of such, on rosuvastatin as an outpatient.  Plan: Continue home statin.      #) Chronic diastolic heart failure: documented history of such, with most recent echocardiogram performed in January 2021, as further detailed above. No clinical evidence to suggest acutely decompensated heart failure at this time.  Not on any diuretic medications or any AV  nodal blockers as an outpatient.  Plan: monitor strict I's & O's and daily weights. Repeat BMP in the morning. Check serum magnesium level.  In the setting of presenting syncope, will be monitoring on telemetry.  Additionally, in this context, will also check echocardiogram in the morning, as further detailed above.     #) CKD 3A: Documented history of such, with baseline creatinine range noted to be 1.4-1.6, with presenting serum creatinine down to be consistent with this baseline range.  Plan: Monitor strict I's and O's and daily weights.  Tempt avoid nephrotoxic agents.  Repeat BMP in the morning.  Add on serum magnesium level.      DVT prophylaxis: Continue home Eliquis Code Status: Full code Family Communication: none Disposition Plan: Per Rounding Team Consults called: none  Admission status: Observation   Of note, this patient was added by me to the following Admit List/Treatment Team:  mcadmits.   Of note, the Adult Admission Order Set (Multimorbid order set) was used by me in the admission process for this patient.  PLEASE NOTE THAT DRAGON DICTATION SOFTWARE WAS USED IN THE CONSTRUCTION OF THIS NOTE.   Rhetta Mura DO [facility] Pager [phone] From [place]   03/20/2021, 5:43 AM

## 2021-03-20 NOTE — ED Triage Notes (Addendum)
Unwitnessed fall in the bathroom, toilet broke in half. According to wife, pt hit his head on the sink. Pt on blood thinners. Syncopal episode with EMS, HR brady down to 42. No obvious injuries/trauma noted. Arrives in cervical collar to [facility] ED. 167mL NS given by EMS.

## 2021-03-20 NOTE — Progress Notes (Signed)
Situation: Chaplain Medinas-Lockley responding to page for trauma level 2 for pt Jay Day.  Background: Facts: Per report, [name] had a fall on thinners. Family: Not assessed at this time. Feelings: Not assessed at this time. Faith: [name] shared that he identifies as "atheist" and discussed his beliefs in morality and ethics.  Actions & Assessments: Chaplain provided compassionate presence.  Recommendations: Chaplain remains available for follow-up spiritual/emotional support as needed.  Rev. Susanne Borders, MDiv      03/20/21 0200  Clinical Encounter Type  Visited With Patient  Visit Type Initial;ED;Trauma

## 2021-03-20 NOTE — ED Notes (Signed)
Pt given ice water for fluid challenge

## 2021-03-20 NOTE — ED Notes (Signed)
Patient transported to CT 

## 2021-03-20 NOTE — Progress Notes (Signed)
Orthopedic Tech Progress Note Patient Details:  Jay Day 1941-01-28 [id number] Level 2 trauma Patient ID: Jay Day, male   DOB: [birth year], 80 y.o.   MRN: [id number]  Jay Day 03/20/2021, 2:26 AM

## 2021-03-20 NOTE — ED Notes (Signed)
Pt steady on feet. Pt did feel lightheaded

## 2021-03-20 NOTE — ED Notes (Signed)
Portable xray at bedside.

## 2021-03-20 NOTE — ED Notes (Signed)
Pt verbalizes understanding of discharge instructions. Opportunity for questions and answers were provided. Pt discharged from the ED.   ?

## 2021-03-21 NOTE — Discharge Summary (Signed)
Physician Discharge Summary   Patient name: Jay Day  Admit date:     03/20/2021  Discharge date: 03/21/2021  Attending Physician:   Discharge Physician: Berle Mull   PCP: Leeroy Cha, MD    Follow-up Information     Leeroy Cha, MD. Schedule an appointment as soon as possible for a visit in 1 week(s).   Specialty: Internal Medicine Contact information: [address] [place] [address] [phone]         Jerline Pain, MD. Schedule an appointment as soon as possible for a visit in 3 week(s).   Specialty: Cardiology Contact information: [address] [address] [phone]                 Recommendations at discharge: Follow-up with PCP and cardiology as recommended.  Discharge Diagnoses Principal Problem:   Syncope Active Problems:   COVID-19 virus infection   HLD (hyperlipidemia)   Chronic diastolic CHF (congestive heart failure) (HCC)   CKD (chronic kidney disease) stage 3, GFR 30-59 ml/min (HCC)   Resolved Diagnoses Resolved Problems:   * No resolved hospital problems. [facility] Course   Syncope. Likely combination of medication side effect causing vasovagal event as well as dehydration from poor p.o. intake in the setting of COVID-19 infection. Orthostatics mildly positive. Treated with IV hydration with resolution of orthostatics. EKG unremarkable.  Telemetry unremarkable.  No focal deficit on my evaluation. Took 1 dose of cough medicine with codeine. Suspect that is the cause of his syncopal event. Outpatient follow-up with cardiology recommended. Patient was in the past considered for loop recorder placement which I think will benefit the patient. No further need for echocardiogram or structure work-up since his recent echocardiogram was actually negative for any valvular disease.  COVID infection. Currently asymptomatic. Not a candidate for Paxilovid given  anticoagulation.  Chronic diastolic CHF. Continue home regimen. Not volume overloaded.  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  Continue.  HLD. Continue statin.  CKD 3a Renal function at baseline.  Monitor.   Procedures performed: none   Condition at discharge: good  Exam General: Appear in mild distress, no Rash; Oral Mucosa Clear, moist. no Abnormal Neck Mass Or lumps, Conjunctiva normal  Cardiovascular: S1 and S2 Present, no Murmur, Respiratory: good respiratory effort, Bilateral Air entry present and CTA, no Crackles, no wheezes Abdomen: Bowel Sound present, Soft and no tenderness Extremities: no Pedal edema Neurology: alert and oriented to time, place, and person affect appropriate. no new focal deficit Gait not checked due to patient safety concerns   Disposition: Home  Discharge time: greater than 30 minutes. Allergies as of 03/20/2021   No Known Allergies      Medication List     TAKE these medications    benzonatate 100 MG capsule Commonly known as: Tessalon Perles Take 1 capsule (100 mg total) by mouth 3 (three) times daily as needed for cough.   bromocriptine 2.5 MG tablet Commonly known as: PARLODEL Take 1.25 mg by mouth 3 (three) times a week. Mon, Wed, Friday   dorzolamide-timolol 22.3-6.8 MG/ML ophthalmic solution Commonly known as: COSOPT Place 1 drop into both eyes 2 (two) times daily.   Eliquis 2.5 MG Tabs tablet Generic drug: apixaban Take 2.5 mg by mouth 2 (two) times daily.   guaiFENesin 100 MG/5ML Soln Commonly known as: ROBITUSSIN Take 5 mLs (100 mg total) by mouth every 4 (four) hours as needed for cough or to loosen phlegm.  latanoprost 0.005 % ophthalmic solution Commonly known as: XALATAN Place 1 drop into both eyes at bedtime.   Multi For Him 50+ Tabs Take 1 tablet by mouth daily.   Probiotic 250 MG Caps Take 250 mg by mouth daily.        CT Head Wo Contrast  Result Date: 03/20/2021 CLINICAL DATA:  Head trauma,  mod-severe; Polytrauma, critical, head/C-spine injury suspected. Unwitnessed fall, head injury, chronic anticoagulation EXAM: CT HEAD WITHOUT CONTRAST CT CERVICAL SPINE WITHOUT CONTRAST TECHNIQUE: Multidetector CT imaging of the head and cervical spine was performed following the standard protocol without intravenous contrast. Multiplanar CT image reconstructions of the cervical spine were also generated. COMPARISON:  12/04/2019 FINDINGS: CT HEAD FINDINGS Brain: Normal anatomic configuration. Parenchymal volume loss is commensurate with the patient's age. Mild periventricular white matter changes are present likely reflecting the sequela of small vessel ischemia. 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 No abnormal intra or extra-axial mass lesion or fluid collection. No abnormal mass effect or midline shift. No evidence of acute intracranial hemorrhage or infarct. Ventricular size is normal. Cerebellum unremarkable. Vascular: No asymmetric hyperdense vasculature at the skull base. Skull: Intact Sinuses/Orbits: There is mild mucosal thickening within the frontal and visualized maxillary sinuses as well as opacification of several ethmoid air cells bilaterally. No air-fluid levels. Orbits are unremarkable. Other: Mastoid air cells and middle ear cavities are clear. CT CERVICAL SPINE FINDINGS Alignment: Normal cervical lordosis. 2 mm anterolisthesis of C3 upon C4 and C4 upon C5 as well as 2 mm retrolisthesis of C5 upon C6 is likely degenerative in nature. Skull base and vertebrae: Craniocervical alignment is normal. The atlantodental interval is not widened. There is no acute fracture of the cervical spine. A linear lucency is seen within the a left inferior articular facet of C5 on sagittal imaging which does not appear to extend to the cortex bilaterally and likely represents a vascular groove. No lytic or blastic bone lesion. Soft tissues and spinal canal: No prevertebral fluid or swelling.  No visible canal hematoma. Disc levels: Intervertebral disc space narrowing and endplate remodeling is seen of C4-C7 in keeping with changes of moderate degenerative disc disease. Vertebral body height has been preserved. The prevertebral soft tissues are not thickened on sagittal reformats. Multilevel uncovertebral and facet arthrosis results in multilevel mild-to-moderate neuroforaminal narrowing, most severe at C5-6 bilaterally. No significant canal stenosis. Upper chest: Unremarkable Other: None IMPRESSION: No acute intracranial injury.  No calvarial fracture. No acute fracture or listhesis of the cervical spine. Electronically Signed   By: Fidela Salisbury M.D.   On: 03/20/2021 02:45   CT Cervical Spine Wo Contrast  Result Date: 03/20/2021 CLINICAL DATA:  Head trauma, mod-severe; Polytrauma, critical, head/C-spine injury suspected. Unwitnessed fall, head injury, chronic anticoagulation EXAM: CT HEAD WITHOUT CONTRAST CT CERVICAL SPINE WITHOUT CONTRAST TECHNIQUE: Multidetector CT imaging of the head and cervical spine was performed following the standard protocol without intravenous contrast. Multiplanar CT image reconstructions of the cervical spine were also generated. COMPARISON:  12/04/2019 FINDINGS: CT HEAD FINDINGS Brain: Normal anatomic configuration. Parenchymal volume loss is commensurate with the patient's age. Mild periventricular white matter changes are present likely reflecting the sequela of small vessel ischemia. 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 No abnormal intra or extra-axial mass lesion or fluid collection. No abnormal mass effect or midline shift. No evidence of acute intracranial hemorrhage or infarct. Ventricular size is normal. Cerebellum unremarkable. Vascular: No asymmetric hyperdense vasculature at the skull base. Skull: Intact  Sinuses/Orbits: There is mild mucosal thickening within the frontal and visualized maxillary sinuses as well as opacification of  several ethmoid air cells bilaterally. No air-fluid levels. Orbits are unremarkable. Other: Mastoid air cells and middle ear cavities are clear. CT CERVICAL SPINE FINDINGS Alignment: Normal cervical lordosis. 2 mm anterolisthesis of C3 upon C4 and C4 upon C5 as well as 2 mm retrolisthesis of C5 upon C6 is likely degenerative in nature. Skull base and vertebrae: Craniocervical alignment is normal. The atlantodental interval is not widened. There is no acute fracture of the cervical spine. A linear lucency is seen within the a left inferior articular facet of C5 on sagittal imaging which does not appear to extend to the cortex bilaterally and likely represents a vascular groove. No lytic or blastic bone lesion. Soft tissues and spinal canal: No prevertebral fluid or swelling. No visible canal hematoma. Disc levels: Intervertebral disc space narrowing and endplate remodeling is seen of C4-C7 in keeping with changes of moderate degenerative disc disease. Vertebral body height has been preserved. The prevertebral soft tissues are not thickened on sagittal reformats. Multilevel uncovertebral and facet arthrosis results in multilevel mild-to-moderate neuroforaminal narrowing, most severe at C5-6 bilaterally. No significant canal stenosis. Upper chest: Unremarkable Other: None IMPRESSION: No acute intracranial injury.  No calvarial fracture. No acute fracture or listhesis of the cervical spine. Electronically Signed   By: Fidela Salisbury M.D.   On: 03/20/2021 02:45   DG Pelvis Portable  Result Date: 03/20/2021 CLINICAL DATA:  Syncope, fall EXAM: PORTABLE PELVIS 1-2 VIEWS COMPARISON:  None. FINDINGS: There is no evidence of pelvic fracture or diastasis. No pelvic bone lesions are seen. Left inguinal hernia repair with mesh has been performed. IMPRESSION: Negative. Electronically Signed   By: Fidela Salisbury M.D.   On: 03/20/2021 02:50   DG Chest Portable 1 View  Result Date: 03/20/2021 CLINICAL DATA:  Syncope EXAM:  PORTABLE CHEST 1 VIEW COMPARISON:  07/05/2019 FINDINGS: The heart size and mediastinal contours are within normal limits. Both lungs are clear. The visualized skeletal structures are unremarkable. IMPRESSION: No active disease. Electronically Signed   By: Fidela Salisbury M.D.   On: 03/20/2021 02:48   Results for orders placed or performed during the hospital encounter of 03/20/21  Resp Panel by RT-PCR (Flu A&B, Covid) Nasopharyngeal Swab     Status: Abnormal   Collection Time: 03/20/21  3:07 AM   Specimen: Nasopharyngeal Swab; Nasopharyngeal(NP) swabs in vial transport medium  Result Value Ref Range Status   SARS Coronavirus 2 by RT PCR POSITIVE (A) NEGATIVE Final    Comment: RESULT CALLED TO, READ BACK BY AND VERIFIED WITH: RN Baxter Hire [id number] 03/20/21@4 :17 BY TW (NOTE) SARS-CoV-2 target nucleic acids are DETECTED.  The SARS-CoV-2 RNA is generally detectable in upper respiratory specimens during the acute phase of infection. Positive results are indicative of the presence of the identified virus, but do not rule out bacterial infection or co-infection with other pathogens not detected by the test. Clinical correlation with patient history and other diagnostic information is necessary to determine patient infection status. The expected result is Negative.  Fact Sheet for Patients: EntrepreneurPulse.com.au  Fact Sheet for Healthcare Providers: IncredibleEmployment.be  This test is not yet approved or cleared by the [place] FDA and  has been authorized for detection and/or diagnosis of SARS-CoV-2 by FDA under an Emergency Use Authorization (EUA).  This EUA will remain in effect (meaning this test can  be used) for the duration of  the COVID-19 declaration under Section  564(b)(1) of the Act, 21 U.S.C. section 360bbb-3(b)(1), unless the authorization is terminated or revoked sooner.     Influenza A by PCR NEGATIVE NEGATIVE Final   Influenza B by PCR  NEGATIVE NEGATIVE Final    Comment: (NOTE) The Xpert Xpress SARS-CoV-2/FLU/RSV plus assay is intended as an aid in the diagnosis of influenza from Nasopharyngeal swab specimens and should not be used as a sole basis for treatment. Nasal washings and aspirates are unacceptable for Xpert Xpress SARS-CoV-2/FLU/RSV testing.  Fact Sheet for Patients: EntrepreneurPulse.com.au  Fact Sheet for Healthcare Providers: IncredibleEmployment.be  This test is not yet approved or cleared by the [place] FDA and has been authorized for detection and/or diagnosis of SARS-CoV-2 by FDA under an Emergency Use Authorization (EUA). This EUA will remain in effect (meaning this test can be used) for the duration of the COVID-19 declaration under Section 564(b)(1) of the Act, 21 U.S.C. section 360bbb-3(b)(1), unless the authorization is terminated or revoked.  Performed at [facility], [place] [address], [place], [place] [address]     Signed:  Berle Mull MD.  [facility] 03/21/2021, 6:02 PM

## 2021-03-24 DIAGNOSIS — Z8673 Personal history of transient ischemic attack (TIA), and cerebral infarction without residual deficits: Secondary | ICD-10-CM | POA: Diagnosis not present

## 2021-03-24 DIAGNOSIS — R55 Syncope and collapse: Secondary | ICD-10-CM | POA: Diagnosis not present

## 2021-03-24 DIAGNOSIS — I2510 Atherosclerotic heart disease of native coronary artery without angina pectoris: Secondary | ICD-10-CM | POA: Diagnosis not present

## 2021-04-01 ENCOUNTER — Other Ambulatory Visit: Payer: Self-pay

## 2021-04-01 ENCOUNTER — Ambulatory Visit (INDEPENDENT_AMBULATORY_CARE_PROVIDER_SITE_OTHER): Payer: BLUE CROSS/BLUE SHIELD | Admitting: Cardiology

## 2021-04-01 ENCOUNTER — Encounter: Payer: Self-pay | Admitting: Cardiology

## 2021-04-01 ENCOUNTER — Ambulatory Visit: Payer: BLUE CROSS/BLUE SHIELD | Admitting: Cardiology

## 2021-04-01 VITALS — BP 100/70 | HR 57 | Ht 72.0 in | Wt 181.0 lb

## 2021-04-01 DIAGNOSIS — E782 Mixed hyperlipidemia: Secondary | ICD-10-CM

## 2021-04-01 DIAGNOSIS — R55 Syncope and collapse: Secondary | ICD-10-CM

## 2021-04-01 DIAGNOSIS — I2510 Atherosclerotic heart disease of native coronary artery without angina pectoris: Secondary | ICD-10-CM

## 2021-04-01 NOTE — Assessment & Plan Note (Signed)
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  Recent coronary calcium score was done which was over 400.  I would like for him to be on aggressive lipid-lowering management.  He has stopped his statin.  States that he cannot tolerate this.  Lets go ahead and have him visit the lipid clinic for further ideas given his fairly aggressive coronary calcification.

## 2021-04-01 NOTE — Assessment & Plan Note (Signed)
Recurrent syncope.  Likely vasovagal type in etiology given its concurrence with using the bathroom.  However, since this is recurring, I would like EP to evaluate.  Does he need a loop recorder for instance.  These episodes are fairly significant.

## 2021-04-01 NOTE — Progress Notes (Signed)
Cardiology Office Note:    Date:  04/01/2021   ID:  Jay Day, DOB [birth year], MRN [id number]  PCP:  Leeroy Cha, MD   [facility] Providers Cardiologist:  [name], MD     Referring MD: Leeroy Cha,*    History of Present Illness:    Jay Day is a 80 y.o. male here for the evaluation of syncope.  He presented to the emergency room on 03/20/2021 after an unwitnessed fall in the bathroom.  EMS reports that the toilet broke in half.  Wife states patient hit his head on the sink.  He remembers going to the bathroom to have a bowel movement and the next thing he knows he was unsteady on the toilet.  Denied any preceding dizziness and lightheadedness.  EMS found him to be sitting on the toilet and had an episode of bradycardia with loss of consciousness while checking his vital signs.  They reported he was unresponsive for about 30 seconds.  Heart rate per report was down in the 40s.  He does not recall this.  He does take Xarelto for atrial fibrillation.  No chest pain or shortness of breath.  He appeared very pale, eyes were closed.  He was also diagnosed with COVID 2 days prior to this incident.  Is going to [place] soon to take an art course for 4 days.  He is also switched his diet to mostly vegan, has lost few pounds.  In review of older notes: Back in June 2021 he sustained a fall, was nauseated, briefly passed out this was while sitting on the toilet.  Vasovagal type symptoms.   Had another episode on the toilet. Bike helmet. Nausea sensation. As starting to stand up, holding on the door and lost it.  Vasovagal once again.  All in middle of night, sitting on toilet, quick, woke up in one minute.   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   in situ-mid upper chest (txpbx)   Stroke ([place]) 2017   mild cva   Stroke [facility]) 2016    Past Surgical History:  Procedure Laterality Date   COLONOSCOPY N/A 08/22/2016   Procedure: COLONOSCOPY;  Surgeon: Garlan Fair, MD;  Location: [facility] ENDOSCOPY;  Service: Endoscopy;  Laterality: N/A;   colonscopy  2007   EYE SURGERY     bilateral cataract surgery   HERNIA REPAIR  1960   HERNIA REPAIR     INGUINAL HERNIA REPAIR  08/12/2011   Procedure: LAPAROSCOPIC INGUINAL HERNIA;  Surgeon: [name], MD;  Location: [facility];  Service: General;  Laterality: Left;   [place]   left    LEFT HEART CATHETERIZATION WITH CORONARY ANGIOGRAM N/A 05/06/2014   Procedure: LEFT HEART CATHETERIZATION WITH CORONARY ANGIOGRAM;  Surgeon: [name], MD;  Location: [facility] CATH LAB;  Service: Cardiovascular;  Laterality: N/A;   LYMPHADENECTOMY Bilateral 09/06/2019   Procedure: LYMPHADENECTOMY;  Surgeon: Alexis Frock, MD;  Location: [facility] ORS;  Service: Urology;  Laterality: Bilateral;   ROBOT ASSISTED LAPAROSCOPIC RADICAL PROSTATECTOMY N/A 09/06/2019   Procedure: XI ROBOTIC ASSISTED LAPAROSCOPIC  RADICAL PROSTATECTOMY;  Surgeon: Alexis Frock, MD;  Location: [facility] ORS;  Service: Urology;  Laterality: N/A;  3 HRS   TONSILLECTOMY     as a child    Current Medications: Current Meds  Medication Sig   apixaban (ELIQUIS) 2.5 MG TABS tablet Take 2.5 mg by mouth 2 (two) times daily.   benzonatate (TESSALON PERLES) 100 MG capsule Take 1 capsule (100 mg total) by mouth 3 (three) times daily as needed for cough.   bromocriptine (PARLODEL) 2.5 MG tablet Take 1.25 mg by mouth 3 (three) times a week. Mon, Wed, Friday   Coenzyme Q10 (CO Q 10 PO) Take by mouth.   dorzolamide-timolol (COSOPT) 22.3-6.8 MG/ML ophthalmic solution Place 1 drop into both eyes 2 (two) times daily.    guaiFENesin (ROBITUSSIN) 100 MG/5ML SOLN Take 5 mLs (100 mg total) by mouth every 4 (four) hours as needed for cough or to loosen phlegm.   halobetasol (ULTRAVATE) 0.05 % cream Apply to chest area daily   latanoprost (XALATAN) 0.005 % ophthalmic solution Place 1 drop into both eyes at bedtime.   Multiple Vitamins-Minerals (MULTI FOR HIM 50+) TABS Take 1 tablet by mouth daily.   Saccharomyces boulardii (PROBIOTIC) 250 MG CAPS Take 250 mg by mouth daily.     Allergies:   Gluten meal and Wheat bran   Social History   Socioeconomic History   Marital status: Married    Spouse name: Not on file   Number of children: 2   Years of education: BA   Highest education level: Not on file  Occupational History   Occupation: [employer]  Tobacco Use   Smoking status: Former    Packs/day: 0.30    Years: 5.00    Pack years: 1.50    Types: Cigarettes    Quit date: 1968    Years since quitting: 54.8   Smokeless tobacco: Never  Vaping Use   Vaping Use: Never used  Substance and Sexual Activity   Alcohol use: Never   Drug use: Never   Sexual activity: Not on file  Other Topics Concern   Not on file  Social History Narrative   ** Merged History Encounter **       Patient drinks about 2 cups of caffeine daily. Patient  is left handed.   Social Determinants of Health   Financial Resource Strain: Not on file  Food Insecurity: Not on file  Transportation Needs: Not on file  Physical Activity: Not on file  Stress: Not on file  Social Connections: Not on file     Family History: The patient's family history includes Hypertension in his mother; Kidney Stones in his  mother; Kidney failure in his mother; Prostate cancer in his father; Pulmonary embolism in his father, maternal grandfather, maternal grandmother, mother, paternal grandfather, and paternal grandmother.  ROS:   Please see the history of present illness.     All other systems reviewed and are negative.  EKGs/Labs/Other Studies Reviewed:    The following studies were reviewed today: ER notes, labs, xray   Recent Labs: 03/20/2021: ALT 15; BUN 16; Creatinine, Ser 1.41; Hemoglobin 14.3; Platelets PLATELET CLUMPS NOTED ON SMEAR, COUNT APPEARS DECREASED; Potassium 3.9; Sodium 137  Recent Lipid Panel    Component Value Date/Time   CHOL 111 05/12/2020 0838   TRIG 65 05/12/2020 0838   HDL 53 05/12/2020 0838   CHOLHDL 2.1 05/12/2020 0838   CHOLHDL 4.0 09/14/2015 0618   VLDL 39 09/14/2015 0618   LDLCALC 44 05/12/2020 0838     Risk Assessment/Calculations:          Physical Exam:    VS:  BP 100/70 (BP Location: Left Arm, Patient Position: Sitting, Cuff Size: Normal)   Pulse (!) 57   Ht 6' (1.829 m)   Wt 181 lb (82.1 kg)   SpO2 97%   BMI 24.55 kg/m     Wt Readings from Last 3 Encounters:  04/01/21 181 lb (82.1 kg)  03/20/21 182 lb (82.6 kg)  03/02/21 185 lb 6.4 oz (84.1 kg)     GEN:  Well nourished, well developed in no acute distress HEENT: Normal NECK: No JVD; No carotid bruits LYMPHATICS: No lymphadenopathy CARDIAC: RRR, no murmurs, rubs, gallops RESPIRATORY:  Clear to auscultation without rales, wheezing or rhonchi  ABDOMEN: Soft, non-tender, non-distended MUSCULOSKELETAL:  No edema; No deformity  SKIN: Warm and  dry NEUROLOGIC:  Alert and oriented x 3 PSYCHIATRIC:  Normal affect   ASSESSMENT:    1. Mixed hyperlipidemia   2. Syncope, unspecified syncope type   3. Coronary artery disease involving native coronary artery of native heart without angina pectoris    PLAN:    In order of problems listed above:  Syncope Recurrent syncope.  Likely vasovagal type in etiology given its concurrence with using the bathroom.  However, since this is recurring, I would like EP to evaluate.  Does he need a loop recorder for instance.  These episodes are fairly significant.  CAD (coronary artery disease) Prior cardiac catheterization in 2015 showed nonobstructive calcific coronary artery disease.  Recent coronary calcium score was done which was over 400.  I would like for him to be on aggressive lipid-lowering management.  He has stopped his statin.  States that he cannot tolerate this.  Lets go ahead and have him visit the lipid clinic for further ideas given his fairly aggressive coronary calcification.    Nonischemic cardiomyopathy -Echo showed 45 to 50% EF in 2015.  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  Personally reviewed echocardiogram.   Stroke 2017 -No recurrence.  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   Aortic atherosclerosis -Risk factor modification.         Medication Adjustments/Labs and Tests Ordered: Current medicines are reviewed at length with the patient today.  Concerns regarding medicines are outlined above.  Orders Placed This Encounter  Procedures   AMB Referral to [facility] Pharm[facility]   Ambulatory referral to Cardiac Electrophysiology   No orders  of the defined types were placed in this encounter.   Patient Instructions  Medication Instructions:  The current medical regimen is  effective;  continue present plan and medications.  *If you need a refill on your cardiac medications before your next appointment, please call your pharmacy*  You have been referred to our [facility] with our pharmacy team.  You have been referred to our Electrophysiologist to discuss recurrent syncope.  Follow-Up: At [facility], you and your health needs are our priority.  As part of our continuing mission to provide you with exceptional heart care, we have created designated Provider Care Teams.  These Care Teams include your primary Cardiologist (physician) and Advanced Practice Providers (APPs -  Physician Assistants and Nurse Practitioners) who all work together to provide you with the care you need, when you need it.  We recommend signing up for the patient portal called "MyChart".  Sign up information is provided on this After Visit Summary.  MyChart is used to connect with patients for Virtual Visits (Telemedicine).  Patients are able to view lab/test results, encounter notes, upcoming appointments, etc.  Non-urgent messages can be sent to your provider as well.   To learn more about what you can do with MyChart, go to [email or web address].    Your next appointment:   1 year(s)  The format for your next appointment:   In Person  Provider:   [name], MD   Thank you for choosing [facility]!!     Signed, [name], MD  04/01/2021 4:18 PM    [place]

## 2021-04-01 NOTE — Patient Instructions (Signed)
Medication Instructions:  The current medical regimen is effective;  continue present plan and medications.  *If you need a refill on your cardiac medications before your next appointment, please call your pharmacy*  You have been referred to our [facility] with our pharmacy team.  You have been referred to our Electrophysiologist to discuss recurrent syncope.  Follow-Up: At [facility], you and your health needs are our priority.  As part of our continuing mission to provide you with exceptional heart care, we have created designated Provider Care Teams.  These Care Teams include your primary Cardiologist (physician) and Advanced Practice Providers (APPs -  Physician Assistants and Nurse Practitioners) who all work together to provide you with the care you need, when you need it.  We recommend signing up for the patient portal called "MyChart".  Sign up information is provided on this After Visit Summary.  MyChart is used to connect with patients for Virtual Visits (Telemedicine).  Patients are able to view lab/test results, encounter notes, upcoming appointments, etc.  Non-urgent messages can be sent to your provider as well.   To learn more about what you can do with MyChart, go to [email or web address].    Your next appointment:   1 year(s)  The format for your next appointment:   In Person  Provider:   [name], MD   Thank you for choosing [facility]!!

## 2021-04-07 ENCOUNTER — Telehealth: Payer: Self-pay | Admitting: Cardiology

## 2021-04-07 DIAGNOSIS — Z86711 Personal history of pulmonary embolism: Secondary | ICD-10-CM | POA: Diagnosis not present

## 2021-04-07 DIAGNOSIS — R55 Syncope and collapse: Secondary | ICD-10-CM | POA: Diagnosis not present

## 2021-04-07 DIAGNOSIS — I2510 Atherosclerotic heart disease of native coronary artery without angina pectoris: Secondary | ICD-10-CM | POA: Diagnosis not present

## 2021-04-07 DIAGNOSIS — Z01812 Encounter for preprocedural laboratory examination: Secondary | ICD-10-CM

## 2021-04-07 DIAGNOSIS — Z23 Encounter for immunization: Secondary | ICD-10-CM | POA: Diagnosis not present

## 2021-04-07 DIAGNOSIS — I208 Other forms of angina pectoris: Secondary | ICD-10-CM | POA: Diagnosis not present

## 2021-04-07 DIAGNOSIS — R079 Chest pain, unspecified: Secondary | ICD-10-CM | POA: Diagnosis not present

## 2021-04-07 DIAGNOSIS — R072 Precordial pain: Secondary | ICD-10-CM

## 2021-04-07 NOTE — Telephone Encounter (Signed)
Pt c/o of Chest Pain: STAT if CP now or developed within 24 hours  1. Are you having CP right now? no  2. Are you experiencing any other symptoms (ex. SOB, nausea, vomiting, sweating)? no  3. How long have you been experiencing CP? Happened once  4. Is your CP continuous or coming and going? It last 40 mins, he rested and it went away.   5. Have you taken Nitroglycerin? No  Patient went for a walk he experienced angina.  He continue his walk it didn't stop him from his hour walk, it last for about 40 mins. He went to see his PCP this morning, they did an EKG looked normal. But she did suggest to follow up with Dr. Marlou Porch as he may want to do a cardiac cath to check for any blockages.

## 2021-04-07 NOTE — Telephone Encounter (Signed)
Returned call to patient who states he had left sided chest pain just below pectoral muscle that started 5 minutes into a 45 minute walk on 10/25. He completed his walk and then rested at home. He states it took a while for the pain to resolve but he is currently pain free.  Does not have nitroglycerin.  Patient has been active since Covid infection Oct. 5 but did not have any discomfort until 4 days ago when he first noticed left sided chest discomfort; worst episode is described above. Patient saw PCP this morning and had EKG and cardiac enzymes. PCP reported normal looking EKG; patient has not received call about lab results. States he was advised to call our office to report to Dr. Marlou Porch. Pt had LHC 11/15 that showed non-obstructive CAD. I advised that I will forward message to Dr. Marlou Porch for advice and that someone from our office will call him back. Patient verbalized understanding and agreement and thanked me for the call.

## 2021-04-07 NOTE — Telephone Encounter (Signed)
Let's set him up with coronary CT with possible FFR Thanks [name], MD

## 2021-04-08 MED ORDER — METOPROLOL TARTRATE 50 MG PO TABS: 50.0000 mg | ORAL_TABLET | ORAL | 0 refills | Status: DC

## 2021-04-08 NOTE — Telephone Encounter (Signed)
   Your cardiac CT will be scheduled at:   [facility] [address] [place], [place] [address] [phone]  Please arrive at the [facility] main entrance (entrance A) of [facility] 30 minutes prior to test start time. You can use the FREE valet parking offered at the main entrance (encouraged to control the heart rate for the test) Proceed to the [facility] Radiology Department (first floor) to check-in and test prep.  Please follow these instructions carefully (unless otherwise directed):  Hold all erectile dysfunction medications at least 3 days (72 hrs) prior to test.  On the Night Before the Test: Be sure to Drink plenty of water. Do not consume any caffeinated/decaffeinated beverages or chocolate 12 hours prior to your test. Do not take any antihistamines 12 hours prior to your test.  On the Day of the Test: Drink plenty of water until 1 hour prior to the test. Do not eat any food 4 hours prior to the test. You may take your regular medications prior to the test.  Take metoprolol (Lopressor) two hours prior to test. HOLD Furosemide/Hydrochlorothiazide morning of the test.  After the Test: Drink plenty of water. After receiving IV contrast, you may experience a mild flushed feeling. This is normal. On occasion, you may experience a mild rash up to 24 hours after the test. This is not dangerous. If this occurs, you can take Benadryl 25 mg and increase your fluid intake. If you experience trouble breathing, this can be serious. If it is severe call 911 IMMEDIATELY. If it is mild, please call our office. If you take any of these medications: Glipizide/Metformin, Avandament, Glucavance, please do not take 48 hours after completing test unless otherwise instructed.  Please allow 2-4 weeks for scheduling of routine cardiac CTs. Some insurance companies require a pre-authorization which may delay scheduling of this test.   For non-scheduling related  questions, please contact the cardiac imaging nurse navigator should you have any questions/concerns: Marchia Bond, Cardiac Imaging Nurse Navigator Gordy Clement, Cardiac Imaging Nurse Navigator [facility] Direct Office Dial: [phone]   For scheduling needs, including cancellations and rescheduling, please call [place], [phone].  Pt is aware of the above instructions.  He is aware he will be contacted to be scheduled.

## 2021-04-13 ENCOUNTER — Other Ambulatory Visit: Payer: Self-pay

## 2021-04-13 ENCOUNTER — Ambulatory Visit (INDEPENDENT_AMBULATORY_CARE_PROVIDER_SITE_OTHER): Payer: Medicare Other | Admitting: Pharmacist

## 2021-04-13 VITALS — BP 108/66 | HR 57

## 2021-04-13 DIAGNOSIS — I2510 Atherosclerotic heart disease of native coronary artery without angina pectoris: Secondary | ICD-10-CM | POA: Diagnosis not present

## 2021-04-13 DIAGNOSIS — I700 Atherosclerosis of aorta: Secondary | ICD-10-CM | POA: Diagnosis not present

## 2021-04-13 DIAGNOSIS — Z86711 Personal history of pulmonary embolism: Secondary | ICD-10-CM | POA: Diagnosis not present

## 2021-04-13 DIAGNOSIS — Z8673 Personal history of transient ischemic attack (TIA), and cerebral infarction without residual deficits: Secondary | ICD-10-CM | POA: Diagnosis not present

## 2021-04-13 DIAGNOSIS — R55 Syncope and collapse: Secondary | ICD-10-CM | POA: Diagnosis not present

## 2021-04-13 DIAGNOSIS — I208 Other forms of angina pectoris: Secondary | ICD-10-CM | POA: Diagnosis not present

## 2021-04-13 NOTE — Patient Instructions (Addendum)
It was nice meeting you today  We would like your LDL (bad cholesterol) to be less than 70  Continue your rosuvastatin 5mg  once daily  Continue your Vegan diet and exercise plan  We will recheck your cholesterol panel if your PCP had not previously ordered one  Please call with any questions!  Karren Cobble, PharmD, BCACP, [place], [place] [address], [place], [place] [address] Phone: [phone]; Fax: [phone] 04/13/2021 11:04 AM

## 2021-04-13 NOTE — Progress Notes (Signed)
Patient ID: Jay Day                 DOB: 04-05-1941                    MRN: [id number]     HPI: Jay Day is a 80 y.o. male patient referred to lipid clinic by Dr Marlou Porch. PMH is significant for PE, HTN, CHF, DVT, CVA, and CAD.  Seen by Dr. Marlou Porch after reports of angina while walking.  Referred to lipid clinic after coronary calcium test showed score of 472.  Patient presents today in good spirits.  Is physically active.  Was walking for for an hour a day but now due chest pain incident, walks multiple times a day but only about 10 minutes at a time.  Read a book written by a physician at the [facility] which endorsed a vegan diet.  He has not had any meat in many months.  Reports he feels much better since starting the plant based diet.  Had previously discontinued statins but due to calcium score has restarted rosuvstatin 5mg  daily.  Has had no adverse effects.  Does not drink alcohol and reports no family history of CAD.  Current Medications:rosuvastatin 5mg   Intolerances: atorvastatin 10mg  daily, Risk Factors: HTN, CAD, CHF, CVA, coronary calcium score LDL goal: <70   Labs: TC 98, HDL 47, Trigs 86, LDL 33 (02/01/21 - not on any therapy at the time)   Coronary calcium score of 472 is at the 58th percentile for the patient's age, sex and race.  Stable evidence of prior granulomatous disease.   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     benzonatate (TESSALON PERLES) 100 MG capsule Take 1 capsule (100 mg total) by mouth 3 (three) times daily as needed for cough. 30 capsule 0   bromocriptine (PARLODEL) 2.5 MG tablet Take 1.25 mg by mouth 3 (three) times a week. [place], Wed,  Friday     Coenzyme Q10 (CO Q 10 PO) Take by mouth.     dorzolamide-timolol (COSOPT) 22.3-6.8 MG/ML ophthalmic solution Place 1 drop into both eyes 2 (two) times daily.      guaiFENesin (ROBITUSSIN) 100 MG/5ML SOLN Take 5 mLs (100 mg total) by mouth every 4 (four) hours as needed for cough or to loosen phlegm. 236 mL 0   halobetasol (ULTRAVATE) 0.05 % cream Apply to chest area daily 50 g 2   latanoprost (XALATAN) 0.005 % ophthalmic solution Place 1 drop into both eyes at bedtime.     metoprolol tartrate (LOPRESSOR) 50 MG tablet Take 1 tablet (50 mg total) by mouth as directed. Take 1 tablet 2 hours  before your CT scan 1 tablet 0   Multiple Vitamins-Minerals (MULTI FOR HIM 50+) TABS Take 1 tablet by mouth daily.     Saccharomyces boulardii (PROBIOTIC) 250 MG CAPS Take 250 mg by mouth daily.     No current facility-administered medications on file prior to visit.    Allergies  Allergen Reactions   Gluten Meal Other (See Comments)     Gluten Sensitivity (reaction undefined) NO BREAD   Wheat Bran Other (See Comments)    Reaction undefined    Assessment/Plan:  1. Hyperlipidemia - Patient's current LDL 33 from PCP which is at goal of <70. Lab drawn in August 2022 before he had restarted rosuvastatin so LDL has possibly decreased further.  Recommended patient continue statin daily since he is tolerating.  Recommended continuing daily exercise as tolerated and following his plant based diet.  Patient voiced understanding.  Recheck as needed  Continue rosuvastatin 5mg  daily Recheck as needed  Karren Cobble, PharmD, BCACP, [place], [place] [address] [place], [place] [address] Phone: [phone]; Fax: [phone] 04/13/2021 6:14 PM

## 2021-04-15 ENCOUNTER — Other Ambulatory Visit: Payer: Self-pay

## 2021-04-15 ENCOUNTER — Other Ambulatory Visit: Payer: Medicare Other

## 2021-04-15 ENCOUNTER — Telehealth (HOSPITAL_COMMUNITY): Payer: Self-pay | Admitting: Emergency Medicine

## 2021-04-15 DIAGNOSIS — R072 Precordial pain: Secondary | ICD-10-CM

## 2021-04-15 DIAGNOSIS — Z01812 Encounter for preprocedural laboratory examination: Secondary | ICD-10-CM

## 2021-04-15 NOTE — Telephone Encounter (Signed)
Reaching out to patient to offer assistance regarding upcoming cardiac imaging study; pt verbalizes understanding of appt date/time, parking situation and where to check in, pre-test NPO status and medications ordered, and verified current allergies; name and call back number provided for further questions should they arise Jay Bond RN Navigator Cardiac Imaging [facility] [phone] office [phone] cell  Pt reminded to get labs

## 2021-04-16 LAB — BASIC METABOLIC PANEL
BUN/Creatinine Ratio: 12 (ref 10–24)
BUN: 15 mg/dL (ref 8–27)
CO2: 26 mmol/L (ref 20–29)
Calcium: 8.8 mg/dL (ref 8.6–10.2)
Chloride: 109 mmol/L — ABNORMAL HIGH (ref 96–106)
Creatinine, Ser: 1.23 mg/dL (ref 0.76–1.27)
Glucose: 105 mg/dL — ABNORMAL HIGH (ref 70–99)
Potassium: 4.3 mmol/L (ref 3.5–5.2)
Sodium: 146 mmol/L — ABNORMAL HIGH (ref 134–144)
eGFR: 60 mL/min/{1.73_m2} (ref >59)

## 2021-04-19 ENCOUNTER — Encounter (HOSPITAL_COMMUNITY): Payer: Self-pay

## 2021-04-19 ENCOUNTER — Other Ambulatory Visit: Payer: Self-pay | Admitting: Cardiology

## 2021-04-19 ENCOUNTER — Other Ambulatory Visit: Payer: Self-pay

## 2021-04-19 ENCOUNTER — Ambulatory Visit (HOSPITAL_COMMUNITY)
Admission: RE | Admit: 2021-04-19 | Discharge: 2021-04-19 | Disposition: A | Discharge status: Home / Self Care | Payer: Medicare Other | Source: Ambulatory Visit | Attending: Cardiology | Admitting: Cardiology

## 2021-04-19 DIAGNOSIS — R931 Abnormal findings on diagnostic imaging of heart and coronary circulation: Secondary | ICD-10-CM

## 2021-04-19 DIAGNOSIS — R072 Precordial pain: Secondary | ICD-10-CM | POA: Insufficient documentation

## 2021-04-19 MED ORDER — NITROGLYCERIN 0.4 MG SL SUBL
0.8000 mg | SUBLINGUAL_TABLET | Freq: Once | SUBLINGUAL | Status: AC
  Administered 2021-04-19: 0.8 mg via SUBLINGUAL

## 2021-04-19 MED ORDER — IOHEXOL 350 MG/ML SOLN
95.0000 mL | Freq: Once | INTRAVENOUS | Status: AC | PRN
  Administered 2021-04-19: 95 mL via INTRAVENOUS

## 2021-04-19 MED ORDER — NITROGLYCERIN 0.4 MG SL SUBL
SUBLINGUAL_TABLET | SUBLINGUAL | Status: AC
  Filled 2021-04-19: qty 2

## 2021-04-20 ENCOUNTER — Telehealth: Payer: Self-pay | Admitting: Cardiology

## 2021-04-20 DIAGNOSIS — R072 Precordial pain: Secondary | ICD-10-CM | POA: Diagnosis not present

## 2021-04-20 MED ORDER — APIXABAN 5 MG PO TABS: 5.0000 mg | ORAL_TABLET | Freq: Two times a day (BID) | ORAL | 3 refills | Status: DC

## 2021-04-20 NOTE — Telephone Encounter (Signed)
-----   Message from Jerline Pain, MD sent at 04/20/2021 11:20 AM EST ----- Small apical thrombus and section of apical aneurysm seen on CT scan.  Nonflow limiting coronary artery disease otherwise.  I would like for him to increase his Eliquis from 2.5 mg twice a day up to 5 mg twice a day for full anticoagulation.  Working with lipid clinic.  Currently on Crestor 5 mg.

## 2021-04-20 NOTE — Telephone Encounter (Signed)
Patient is returning call to discuss CT results. 

## 2021-04-20 NOTE — Telephone Encounter (Signed)
Please see CT result note

## 2021-04-20 NOTE — Telephone Encounter (Addendum)
The patient has been notified of the result and verbalized understanding.  All questions (if any) were answered. Darrell Jewel, RN 04/20/2021 11:42 AM    Sent in Eliquis 5 mg daily.

## 2021-04-21 ENCOUNTER — Ambulatory Visit (INDEPENDENT_AMBULATORY_CARE_PROVIDER_SITE_OTHER): Payer: Medicare Other | Admitting: Internal Medicine

## 2021-04-21 ENCOUNTER — Other Ambulatory Visit: Payer: Self-pay

## 2021-04-21 VITALS — BP 116/70 | HR 55 | Ht 72.0 in | Wt 181.2 lb

## 2021-04-21 DIAGNOSIS — R55 Syncope and collapse: Secondary | ICD-10-CM

## 2021-04-21 NOTE — Patient Instructions (Addendum)
Medication Instructions:  Your physician recommends that you continue on your current medications as directed. Please refer to the Current Medication list given to you today.  Labwork: None ordered.  Testing/Procedures: None ordered.  Follow-Up: Your physician wants you to follow-up as needed  Any Other Special Instructions Will Be Listed Below (If Applicable).  If you need a refill on your cardiac medications before your next appointment, please call your pharmacy.

## 2021-04-21 NOTE — Progress Notes (Signed)
HPI Jay Day is referred by Dr. Marlou Porch for evaluation of recurrent syncope. He had Covid infection about 2 years ago and has subsequently had lower bp and has had 3 episodes of syncope which have all occurred at night while he is having a BM. He has a couple of minutes of feeling bad before he goes out. No documented slow or fast heart rates. He has not had chest pain or sob. He has started eating a vegan diet. No palpitations. He wore a cardiac monitor which demonstrated NSVT. 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 The patient does not have obstructive CAD. 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 He is now on eliquis and will be on this for the future. Allergies  Allergen Reactions   Gluten Meal Other (See Comments)    Gluten Sensitivity (reaction undefined) NO BREAD   Wheat Bran Other (See Comments)    Reaction undefined     Current Outpatient Medications  Medication Sig Dispense Refill   apixaban (ELIQUIS) 5 MG TABS tablet Take 1 tablet (5 mg total) by mouth 2 (two) times daily. 180 tablet 3   benzonatate (TESSALON PERLES) 100 MG capsule Take 1 capsule (100 mg total) by mouth 3 (three) times daily as needed for cough. 30 capsule 0   bromocriptine (PARLODEL) 2.5 MG tablet Take 1.25 mg by mouth 3 (three) times a week. Mon, Wed, Friday     Coenzyme Q10 (CO Q 10 PO) Take by mouth.     dorzolamide-timolol (COSOPT) 22.3-6.8 MG/ML ophthalmic solution Place 1 drop into both eyes 2 (two) times daily.      guaiFENesin (ROBITUSSIN) 100 MG/5ML SOLN Take 5 mLs (100 mg total) by mouth every 4 (four) hours as needed for cough or to loosen phlegm. 236 mL 0   halobetasol (ULTRAVATE) 0.05 % cream Apply to chest area daily 50 g 2   latanoprost (XALATAN) 0.005 % ophthalmic solution Place 1 drop into both eyes at bedtime.     Multiple Vitamins-Minerals (MULTI FOR HIM 50+) TABS Take 1 tablet by mouth daily.      nitroGLYCERIN (NITROSTAT) 0.4 MG SL tablet Place 1 tablet under the tongue See admin instructions. As needed     Saccharomyces boulardii (PROBIOTIC) 250 MG CAPS Take 250 mg by mouth daily.     No current facility-administered medications for this visit.     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   Past Surgical History:  Procedure Laterality Date   COLONOSCOPY N/A 08/22/2016   Procedure: COLONOSCOPY;  Surgeon: Garlan Fair, MD;  Location: [facility];  Service: Endoscopy;  Laterality: N/A;   colonscopy  2007   EYE SURGERY     bilateral cataract surgery   HERNIA REPAIR  1960   HERNIA REPAIR     INGUINAL HERNIA REPAIR  08/12/2011   Procedure: LAPAROSCOPIC INGUINAL HERNIA;  Surgeon: [name], MD;  Location: [place];  Service:  General;  Laterality: Left;   [place]   left    LEFT HEART CATHETERIZATION WITH CORONARY ANGIOGRAM N/A 05/06/2014   Procedure: LEFT HEART CATHETERIZATION WITH CORONARY ANGIOGRAM;  Surgeon: [name], MD;  Location: [facility] CATH LAB;  Service: Cardiovascular;  Laterality: N/A;   LYMPHADENECTOMY Bilateral 09/06/2019   Procedure: LYMPHADENECTOMY;  Surgeon: Alexis Frock, MD;  [facility];  Service: Urology;  Laterality: Bilateral;   ROBOT ASSISTED LAPAROSCOPIC RADICAL PROSTATECTOMY N/A 09/06/2019   Procedure: XI ROBOTIC ASSISTED LAPAROSCOPIC RADICAL PROSTATECTOMY;  Surgeon: Alexis Frock, MD;  [facility]: [facility];  Service: Urology;  Laterality: N/A;  3 HRS   TONSILLECTOMY     as a child     Family History  Problem Relation Age of Onset   Pulmonary embolism Mother    Kidney failure Mother    Hypertension Mother    Kidney Stones Mother    Pulmonary embolism Father    Prostate cancer Father    Pulmonary embolism Maternal Grandmother    Pulmonary embolism Maternal Grandfather    Pulmonary embolism Paternal Grandmother    Pulmonary embolism Paternal Grandfather      Social History   Socioeconomic History   Marital status: Married    Spouse name: Not on file   Number of children: 2   Years of education: BA   Highest education level: Not on file  Occupational History   Occupation: [employer]  Tobacco Use   Smoking status: Former    Packs/day: 0.30    Years: 5.00    Pack years: 1.50    Types: Cigarettes    Quit date: 1968    Years since quitting: 54.8   Smokeless tobacco: Never  Vaping Use   Vaping Use: Never used  Substance and Sexual Activity   Alcohol use: Never   Drug use: Never   Sexual activity: Not on file  Other Topics Concern   Not on file  Social History Narrative   ** Merged History Encounter **       Patient drinks about 2 cups of caffeine daily. Patient is left handed.   Social Determinants of Health   Financial Resource Strain: Not  on file  Food Insecurity: Not on file  Transportation Needs: Not on file  Physical Activity: Not on file  Stress: Not on file  Social Connections: Not on file  Intimate Partner Violence: Not on file     BP 116/70   Pulse (!) 55   Ht 6' (1.829 m)  Wt 181 lb 3.2 oz (82.2 kg)   SpO2 99%   BMI 24.58 kg/m   Physical Exam:  Well appearing NAD HEENT: Unremarkable Neck:  No JVD, no thyromegally Lymphatics:  No adenopathy Back:  No CVA tenderness Lungs:  Clear with no wheezes HEART:  Regular rate rhythm, no murmurs, no rubs, no clicks Abd:  soft, positive bowel sounds, no organomegally, no rebound, no guarding Ext:  2 plus pulses, no edema, no cyanosis, no clubbing Skin:  No rashes no nodules Neuro:  CN II through XII intact, motor grossly intact  EKG - none  Zio monitor - reviewed  Assess/Plan:  Syncope - the most likely etiology is autonomic dysfunction as they have all occurred while using the bathroom in the middle of the night. I have discussed the physiology and recommended watchful waiting. 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 I discussed the implications but recommended watchful waiting.  Carleene Overlie [place] H[place]on,MD

## 2021-04-22 ENCOUNTER — Telehealth: Payer: Self-pay | Admitting: Internal Medicine

## 2021-04-22 NOTE — Telephone Encounter (Signed)
Lm for pt to c/b to discuss his concerns.

## 2021-04-22 NOTE — Telephone Encounter (Signed)
Patient is requesting to speak with Dr. Tanna Furry nurse. He states there were a few questions he forgot to ask during 11/09 appointment. He states he is on a statin, but his cholesterol is low. He would like to know whether or not it is appropriate to continue taking it. He does not know the name of the medication. He states he would also like to go over other lab results as well. Please return call to discuss when able.

## 2021-04-23 NOTE — Telephone Encounter (Signed)
Spoke with pt who has been on a vegan/no fat diet.  Has lost 10 to 15 lbs.  His recent Lipid panel demonstrated total chol 99, LDL was 33.  Pt will be sending those results through his MyChart for our records.  He is asking if he needs to continue taking a statin at this point.  Currently taking Crestor 5 mg a day.  Also, coronary CT demonstrated a small apical thrombus.   Eliquis was increased from 2.5 mg BID to 5 mg BID.  Pt is asking if and when there will be follow up to determine if clot has resolved.  He would prefer to not have to take 5 mg BID if possible.  Reports he recently had Covid (10/5) and seemed to have been having angina with activity since.  None currently.  Exercise tolerance has been impeded as well.   Advised I will have Dr Marlou Porch review and will c/b with further instructions.

## 2021-04-24 NOTE — Telephone Encounter (Signed)
You suffered a stroke presumably from a blood clot that formed in your apical aneurysm.  This blood clot had resolved.  Unfortunately, the CT scan showed return of the blood clot.  I would recommend continued use of Eliquis full dose 5 mg twice a day because of this.  The rationale is that you developed a blood clot while on Eliquis 2.5 mg twice a day.  Unless you have any significant bleeding issues, my recommendation is to continue with full dose anticoagulation.  I would hate for you to have another stroke especially a debilitating stroke.  Given the highly elevated coronary calcium score, showing coronary artery plaque, I would recommend continuing his Crestor for plaque stabilization.  This is beneficial in reducing heart attack risks for people with known coronary artery disease like yourself.  I continue to encourage use of vegan diet.  I am glad that we recently did a coronary CT scan which is essentially like a heart catheterization showing [place] your coronary arteries in detail.  It did not show any obstructive coronary artery disease.  This is how we determine if chest pain is coming from the heart.  Thankfully, your score nonflow limiting therefore no cardiac angina. Certainly COVID can cause inflammation of the pleural tissues/lungs which can lead to sensation of chest discomfort.  I am glad that this is better.  This shows that inflammation is improved.  COVID can also impede your exercise tolerance.  Thankfully I do know that your heart arteries are not impeding this.  Continue with daily exercise.   We will see back in 1 year.  [name], MD

## 2021-04-27 NOTE — Telephone Encounter (Signed)
Lm for pt to call back to discuss Dr Marlou Porch comments/recommendations.

## 2021-05-18 DIAGNOSIS — Z0000 Encounter for general adult medical examination without abnormal findings: Secondary | ICD-10-CM | POA: Diagnosis not present

## 2021-05-18 DIAGNOSIS — Z8673 Personal history of transient ischemic attack (TIA), and cerebral infarction without residual deficits: Secondary | ICD-10-CM | POA: Diagnosis not present

## 2021-05-18 DIAGNOSIS — R55 Syncope and collapse: Secondary | ICD-10-CM | POA: Diagnosis not present

## 2021-05-18 DIAGNOSIS — Z1389 Encounter for screening for other disorder: Secondary | ICD-10-CM | POA: Diagnosis not present

## 2021-05-18 DIAGNOSIS — Z86711 Personal history of pulmonary embolism: Secondary | ICD-10-CM | POA: Diagnosis not present

## 2021-05-18 DIAGNOSIS — I2510 Atherosclerotic heart disease of native coronary artery without angina pectoris: Secondary | ICD-10-CM | POA: Diagnosis not present

## 2021-06-21 DIAGNOSIS — C61 Malignant neoplasm of prostate: Secondary | ICD-10-CM | POA: Diagnosis not present

## 2021-06-21 DIAGNOSIS — N5231 Erectile dysfunction following radical prostatectomy: Secondary | ICD-10-CM | POA: Diagnosis not present

## 2021-06-21 DIAGNOSIS — N393 Stress incontinence (female) (male): Secondary | ICD-10-CM | POA: Diagnosis not present

## 2021-06-28 DIAGNOSIS — I2510 Atherosclerotic heart disease of native coronary artery without angina pectoris: Secondary | ICD-10-CM | POA: Diagnosis not present

## 2021-06-28 DIAGNOSIS — I10 Essential (primary) hypertension: Secondary | ICD-10-CM | POA: Diagnosis not present

## 2021-06-28 DIAGNOSIS — I208 Other forms of angina pectoris: Secondary | ICD-10-CM | POA: Diagnosis not present

## 2021-06-28 DIAGNOSIS — M19072 Primary osteoarthritis, left ankle and foot: Secondary | ICD-10-CM | POA: Diagnosis not present

## 2021-07-06 ENCOUNTER — Other Ambulatory Visit: Payer: Medicare Other

## 2021-07-08 ENCOUNTER — Ambulatory Visit: Payer: Medicare Other | Admitting: Endocrinology

## 2021-07-16 ENCOUNTER — Other Ambulatory Visit: Payer: Self-pay

## 2021-07-16 ENCOUNTER — Other Ambulatory Visit (INDEPENDENT_AMBULATORY_CARE_PROVIDER_SITE_OTHER): Payer: Medicare Other

## 2021-07-16 DIAGNOSIS — D352 Benign neoplasm of pituitary gland: Secondary | ICD-10-CM

## 2021-07-17 LAB — PROLACTIN: Prolactin: 11.4 ng/mL (ref 4.0–15.2)

## 2021-07-19 ENCOUNTER — Other Ambulatory Visit: Payer: Self-pay | Admitting: Endocrinology

## 2021-07-21 ENCOUNTER — Encounter: Payer: Self-pay | Admitting: Endocrinology

## 2021-07-21 ENCOUNTER — Ambulatory Visit (INDEPENDENT_AMBULATORY_CARE_PROVIDER_SITE_OTHER): Payer: Medicare Other | Admitting: Endocrinology

## 2021-07-21 ENCOUNTER — Other Ambulatory Visit: Payer: Self-pay

## 2021-07-21 VITALS — BP 122/88 | HR 56 | Ht 72.0 in | Wt 177.0 lb

## 2021-07-21 DIAGNOSIS — D352 Benign neoplasm of pituitary gland: Secondary | ICD-10-CM

## 2021-07-21 NOTE — Progress Notes (Signed)
Patient ID: Jay Day, male   DOB: 1941-06-10, 81 y.o.   MRN: [id number]          Chief complaint:  Follow-up of prolactinoma tumor    History of Present Illness     Prior history: Apparently in 2003 he was being evaluated by his PCP for complaints of decreased libido. 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   He was treated by his internist with bromocriptine.  Because of his relatively low prolactin level with 2.5 mg of bromocriptine he was tried on half tablet in 2012 and prolactin was normal at 5.2 subsequently He had been taking 2.5 mg again for some time but his prolactin level in 11/2011 was only 2.0, in 7/14 was 1.9 and in 01/2014 his prolactin level was low at 1.6   Follow-up MRI in 04/2012 did not show any change in size of the tumor   RECENT HISTORY:  .  Patient has been treated long-term with low doses of bromocriptine   No evidence of hypopituitarism on his evaluation in 10/2014 MRI brain scan done for unrelated reasons in 06/2016 showed:    T2 hyperintense focus centered within the right aspect of the pituitary gland and cavernous sinus best appreciated on the coronal sequence is decreased in size from the 2003 pituitary MRI of the brain and probably represents residual adenoma. Description of pituitary not in MRI done in 07/2019  He has been on variable doses of bromocriptine in the last few years   When he was not taking bromocriptine his prolactin went up to 44 in 04/2019  Since his last visit in 9/22 he had been taking bromocriptine 1.25 mg 3 days a week Previously was taking this 5 days a week and prolactin had come down to 4.0 He takes his bromocriptine at bedtime and does not think it makes him drowsy now He has good energy level and is now back in his  exercise regimen  No headaches  Prolactin now is normal although relatively higher at 11, previously was at 4  Testosterone level has been normal  Free T4 has been consistently normal  Lab Results  Component Value Date   PROLACTIN 11.4 07/16/2021   PROLACTIN 4.0 02/25/2021   PROLACTIN 13.3 02/24/2020   PROLACTIN 9.5 08/19/2019   PROLACTIN 43.6 (H) 04/24/2019   PROLACTIN 10.4 02/04/2019   PROLACTIN 4.3 08/03/2018   PROLACTIN 10.7 06/25/2018    Lab Results  Component Value Date   TSH 1.29 07/10/2018   FREET4 0.82 02/25/2021   FREET4 1.04 02/04/2019   FREET4 0.92 12/19/2017   Lab Results  Component Value Date   TESTOSTERONE 631.36 02/25/2021      Allergies as of 07/21/2021       Reactions   Gluten Meal Other (See Comments)   Gluten Sensitivity (reaction undefined) NO BREAD   Wheat Bran Other (See Comments)   Reaction undefined        Medication List        Accurate as of July 21, 2021 10:49 AM. If you have any questions, ask your nurse or doctor.          apixaban 5 MG Tabs tablet Commonly known as: ELIQUIS Take 1 tablet (5 mg total) by mouth 2 (two) times daily.  benzonatate 100 MG capsule Commonly known as: Tessalon Perles Take 1 capsule (100 mg total) by mouth 3 (three) times daily as needed for cough.   bromocriptine 2.5 MG tablet Commonly known as: PARLODEL Take 1.25 mg by mouth 3 (three) times a week. Mon, Wed, Friday   CO Q 10 PO Take by mouth.   dorzolamide-timolol 22.3-6.8 MG/ML ophthalmic solution Commonly known as: COSOPT Place 1 drop into both eyes 2 (two) times daily.   guaiFENesin 100 MG/5ML Soln Commonly known as: ROBITUSSIN Take 5 mLs (100 mg total) by mouth every 4 (four) hours as needed for cough or to loosen phlegm.   halobetasol 0.05 % cream Commonly known as: ULTRAVATE Apply to chest area daily   latanoprost 0.005 % ophthalmic solution Commonly known as: XALATAN Place 1 drop into both eyes at bedtime.   Multi  For Him 50+ Tabs Take 1 tablet by mouth daily.   nitroGLYCERIN 0.4 MG SL tablet Commonly known as: NITROSTAT Place 1 tablet under the tongue See admin instructions. As needed   Probiotic 250 MG Caps Take 250 mg by mouth daily.        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   in situ-mid upper chest (txpbx)   Stroke ([place]) 2017   mild cva   Stroke [facility]) 2016    Past Surgical History:  Procedure Laterality Date   COLONOSCOPY N/A 08/22/2016   Procedure: COLONOSCOPY;  Surgeon: Garlan Fair, MD;  [facility];  Service: Endoscopy;  Laterality: N/A;   colonscopy  2007   EYE SURGERY     bilateral cataract surgery   HERNIA REPAIR  1960   HERNIA REPAIR     INGUINAL HERNIA REPAIR  08/12/2011    Procedure: LAPAROSCOPIC INGUINAL HERNIA;  Surgeon: [name], MD;  Location: [place];  Service: General;  Laterality: Left;   [place]   left    LEFT HEART CATHETERIZATION WITH CORONARY ANGIOGRAM N/A 05/06/2014   Procedure: LEFT HEART CATHETERIZATION WITH CORONARY ANGIOGRAM;  Surgeon: [name], MD;  Location: [place] CATH LAB;  Service: Cardiovascular;  Laterality: N/A;   LYMPHADENECTOMY Bilateral 09/06/2019   Procedure: LYMPHADENECTOMY;  Surgeon: Alexis Frock, MD;  [facility];  Service: Urology;  Laterality: Bilateral;   ROBOT ASSISTED LAPAROSCOPIC RADICAL PROSTATECTOMY N/A 09/06/2019   Procedure: XI ROBOTIC ASSISTED LAPAROSCOPIC RADICAL PROSTATECTOMY;  Surgeon: Alexis Frock, MD;  [facility];  Service: Urology;  Laterality: N/A;  3 HRS   TONSILLECTOMY     as a child    Family History  Problem Relation Age of Onset   Pulmonary embolism Mother    Kidney failure Mother    Hypertension Mother    Kidney Stones Mother    Pulmonary embolism Father    Prostate cancer Father    Pulmonary embolism Maternal Grandmother    Pulmonary embolism Maternal Grandfather    Pulmonary embolism Paternal Grandmother    Pulmonary embolism Paternal Grandfather     Social History:  reports that he quit smoking about 55 years ago. His smoking use included cigarettes. He has a 1.50 pack-year smoking history. He has never used smokeless tobacco. He reports that he does not drink alcohol and does not use drugs.  ROS  He has been off antihypertensives Cholesterol lower with vegan diet   General Examination:   BP 122/88 (BP Location: Left Arm, Patient Position: Sitting, Cuff Size: Normal)    Pulse (!) 56    Ht 6' (1.829 m)    Wt 177 lb (80.3 kg)    SpO2 95%    BMI 24.01 kg/m     Assessment/ Plan:  PROLACTINOMA: Has a history of a 1.4 cm pituitary tumor at baseline in 2003 and his initial prolactin level was high at 99  His initial symptoms were mostly related to  decreased libido  Had been well controlled with low doses of bromocriptine for several years now, last follow-up a year ago Has been now on bromocriptine 1.25 mg, taking at bedtime on Mondays/Wednesdays and Fridays  With a trial of stopping his bromocriptine previously his prolactin has gone up to 43.6 compared to 10.4 Prolactin is in the normal range at 11  Last MRI of brain did not show any gross pituitary enlargement  Since his prolactin is above the 50th percentile of the normal range he will continue bromocriptine 3 days a week Follow-up in 6 months   Daxtyn Rottenberg 07/21/2021, 10:49 AM

## 2021-08-10 DIAGNOSIS — H401131 Primary open-angle glaucoma, bilateral, mild stage: Secondary | ICD-10-CM | POA: Diagnosis not present

## 2021-08-10 DIAGNOSIS — Z961 Presence of intraocular lens: Secondary | ICD-10-CM | POA: Diagnosis not present

## 2021-08-10 DIAGNOSIS — H5203 Hypermetropia, bilateral: Secondary | ICD-10-CM | POA: Diagnosis not present

## 2021-08-17 DIAGNOSIS — I2510 Atherosclerotic heart disease of native coronary artery without angina pectoris: Secondary | ICD-10-CM | POA: Diagnosis not present

## 2021-08-17 DIAGNOSIS — Z8673 Personal history of transient ischemic attack (TIA), and cerebral infarction without residual deficits: Secondary | ICD-10-CM | POA: Diagnosis not present

## 2021-08-17 DIAGNOSIS — Z86711 Personal history of pulmonary embolism: Secondary | ICD-10-CM | POA: Diagnosis not present

## 2021-08-17 DIAGNOSIS — C61 Malignant neoplasm of prostate: Secondary | ICD-10-CM | POA: Diagnosis not present

## 2021-09-13 DIAGNOSIS — Z8673 Personal history of transient ischemic attack (TIA), and cerebral infarction without residual deficits: Secondary | ICD-10-CM | POA: Diagnosis not present

## 2021-09-13 DIAGNOSIS — I2510 Atherosclerotic heart disease of native coronary artery without angina pectoris: Secondary | ICD-10-CM | POA: Diagnosis not present

## 2021-09-13 DIAGNOSIS — I10 Essential (primary) hypertension: Secondary | ICD-10-CM | POA: Diagnosis not present

## 2021-09-13 DIAGNOSIS — Z86711 Personal history of pulmonary embolism: Secondary | ICD-10-CM | POA: Diagnosis not present

## 2021-09-23 ENCOUNTER — Telehealth: Payer: Self-pay | Admitting: Cardiology

## 2021-09-23 NOTE — Telephone Encounter (Signed)
STAT if patient feels like he/she is going to faint  ? ?Are you dizzy now? No  ? ?Do you feel faint or have you passed out? Pt stated he had a dizzy Spell this morning.  He stated it was about 6:30 this morning.  Pt stated he came down stairs to make breakfast, and had a headache and nauseous and then started to feel dizzy and fell.  He has bloody spot on his head.   ? ?Do you have any other symptoms? Pt denies any other symptoms now .   ? ?Have you checked your HR and BP (record if available)? Pt did not take his bp this morning so he is not sure   ?

## 2021-09-23 NOTE — Telephone Encounter (Signed)
Pt states after being up 5-10 minutes this morning, he developed dizziness, nausea and a slight HA.  Went downstairs and made breakfast.  Jay Day tot sit down while oatmeal was heating up and felt like he might pass out.  He tried to grab something to brace himself but he did ultimately fall.  He was aware of the fall and did not fully pass out.  States he laid on the ground for a couple of minutes to allow the dizziness to pass.  States this episode was similar to previous episodes he had except previous episodes he fully passed out.  Denies fluttering, SOB or CP.  Has a small bloody spot on his head but denies bump or pain to area.  Pt does not check his vitals at home.  States they are always normal at the doctor.  Pt mentioned that he has lost 20lbs since October when he found out his Calcium Score.  Advised I will send message to Jay Day for review. ? ?Pt also mentioned he would like to have labs done to check his particle size.  Also mentioned that he is going today at 3pm to have test done through the [facility] Screening program.  This includes an echo, carotid doppler and AAA.  ?

## 2021-09-24 NOTE — Telephone Encounter (Signed)
Left message for patient to call back  

## 2021-09-27 NOTE — Telephone Encounter (Signed)
Lm to call back ./cy 

## 2021-09-28 NOTE — Telephone Encounter (Signed)
Called and relayed Dr Marlou Porch' message to patient who condones understanding. States he will increase his fluid intake and let us know if anything else arises. ?

## 2021-10-13 DIAGNOSIS — H10411 Chronic giant papillary conjunctivitis, right eye: Secondary | ICD-10-CM | POA: Diagnosis not present

## 2021-10-13 DIAGNOSIS — H401132 Primary open-angle glaucoma, bilateral, moderate stage: Secondary | ICD-10-CM | POA: Diagnosis not present

## 2021-11-04 DIAGNOSIS — N139 Obstructive and reflux uropathy, unspecified: Secondary | ICD-10-CM | POA: Diagnosis not present

## 2021-11-04 DIAGNOSIS — N183 Chronic kidney disease, stage 3 unspecified: Secondary | ICD-10-CM | POA: Diagnosis not present

## 2021-11-04 DIAGNOSIS — Z8546 Personal history of malignant neoplasm of prostate: Secondary | ICD-10-CM | POA: Diagnosis not present

## 2021-11-04 DIAGNOSIS — I129 Hypertensive chronic kidney disease with stage 1 through stage 4 chronic kidney disease, or unspecified chronic kidney disease: Secondary | ICD-10-CM | POA: Diagnosis not present

## 2021-11-10 DIAGNOSIS — N183 Chronic kidney disease, stage 3 unspecified: Secondary | ICD-10-CM | POA: Diagnosis not present

## 2021-11-22 ENCOUNTER — Other Ambulatory Visit: Payer: Self-pay | Admitting: Cardiology

## 2021-11-22 NOTE — Telephone Encounter (Signed)
Prescription refill request for Eliquis received. Indication: DVT/CVA Last office visit: 04/21/21  Beckie Salts MD Scr: 1.11 on 11/10/21 Age:  81 Weight: 82.2kg  Based on above findings Eliquis '5mg'$  twice daily is the appropriate dose.  Refill approved.

## 2021-11-26 DIAGNOSIS — L820 Inflamed seborrheic keratosis: Secondary | ICD-10-CM | POA: Diagnosis not present

## 2022-01-13 DIAGNOSIS — I10 Essential (primary) hypertension: Secondary | ICD-10-CM | POA: Diagnosis not present

## 2022-01-13 DIAGNOSIS — Z86711 Personal history of pulmonary embolism: Secondary | ICD-10-CM | POA: Diagnosis not present

## 2022-01-13 DIAGNOSIS — Z8673 Personal history of transient ischemic attack (TIA), and cerebral infarction without residual deficits: Secondary | ICD-10-CM | POA: Diagnosis not present

## 2022-01-13 DIAGNOSIS — R300 Dysuria: Secondary | ICD-10-CM | POA: Diagnosis not present

## 2022-01-13 DIAGNOSIS — I2510 Atherosclerotic heart disease of native coronary artery without angina pectoris: Secondary | ICD-10-CM | POA: Diagnosis not present

## 2022-01-18 ENCOUNTER — Other Ambulatory Visit (INDEPENDENT_AMBULATORY_CARE_PROVIDER_SITE_OTHER): Payer: Medicare Other

## 2022-01-18 DIAGNOSIS — D352 Benign neoplasm of pituitary gland: Secondary | ICD-10-CM

## 2022-01-18 LAB — T4, FREE: Free T4: 0.93 ng/dL (ref 0.60–1.60)

## 2022-01-19 LAB — PROLACTIN: Prolactin: 6.1 ng/mL (ref 4.0–15.2)

## 2022-01-20 ENCOUNTER — Encounter: Payer: Self-pay | Admitting: Endocrinology

## 2022-01-20 ENCOUNTER — Ambulatory Visit (INDEPENDENT_AMBULATORY_CARE_PROVIDER_SITE_OTHER): Payer: Medicare Other | Admitting: Endocrinology

## 2022-01-20 VITALS — BP 112/76 | HR 51 | Ht 72.0 in | Wt 175.4 lb

## 2022-01-20 DIAGNOSIS — D352 Benign neoplasm of pituitary gland: Secondary | ICD-10-CM

## 2022-01-20 NOTE — Progress Notes (Signed)
Patient ID: Jay Day, male   DOB: 09-Dec-1940, 81 y.o.   MRN: [id number]          Chief complaint:  Follow-up of prolactinoma tumor    History of Present Illness     Prior history: Apparently in 2003 he was being evaluated by his PCP for complaints of decreased libido. 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   He was treated by his internist with bromocriptine.  Because of his relatively low prolactin level with 2.5 mg of bromocriptine he was tried on half tablet in 2012 and prolactin was normal at 5.2 subsequently He had been taking 2.5 mg again for some time but his prolactin level in 11/2011 was only 2.0, in 7/14 was 1.9 and in 01/2014 his prolactin level was low at 1.6   Follow-up MRI in 04/2012 did not show any change in size of the tumor   RECENT HISTORY:  .  Patient has been treated long-term with low doses of bromocriptine   No evidence of hypopituitarism on his evaluation in 10/2014 MRI brain scan done for unrelated reasons in 06/2016 showed:    T2 hyperintense focus centered within the right aspect of the pituitary gland and cavernous sinus best appreciated on the coronal sequence is decreased in size from the 2003 pituitary MRI of the brain and probably represents residual adenoma. Description of pituitary not in MRI done in 07/2019  He has been on variable doses of bromocriptine in the last few years   When he was not taking bromocriptine his prolactin went up to 44 in 04/2019  Since his visit in 9/22 he had been taking bromocriptine 1.25 mg 3 days a week Previously was taking this 5 days a week and prolactin had come down to 4.0 He takes his bromocriptine at bedtime and does not think it makes him drowsy  He has no complaints of fatigue and feels good  No  headaches  Prolactin now is normal although coming down at 6 compared to 11  Testosterone level has been normal  Free T4 has been consistently normal  Lab Results  Component Value Date   PROLACTIN 6.1 01/18/2022   PROLACTIN 11.4 07/16/2021   PROLACTIN 4.0 02/25/2021   PROLACTIN 13.3 02/24/2020   PROLACTIN 9.5 08/19/2019   PROLACTIN 43.6 (H) 04/24/2019   PROLACTIN 10.4 02/04/2019   PROLACTIN 4.3 08/03/2018    Lab Results  Component Value Date   TSH 1.29 07/10/2018   FREET4 0.93 01/18/2022   FREET4 0.82 02/25/2021   FREET4 1.04 02/04/2019   Lab Results  Component Value Date   TESTOSTERONE 631.36 02/25/2021      Allergies as of 01/20/2022       Reactions   Gluten Meal Other (See Comments)   Gluten Sensitivity (reaction undefined) NO BREAD   Wheat Bran Other (See Comments)   Reaction undefined        Medication List        Accurate as of January 20, 2022 10:59 AM. If you have any questions, ask your nurse or doctor.          benzonatate 100 MG capsule Commonly known as: Tessalon Perles Take 1 capsule (100 mg total) by mouth 3 (three) times daily as needed for cough.   bromocriptine 2.5  MG tablet Commonly known as: PARLODEL Take 0.5 tablets (1.25 mg total) by mouth 3 (three) times a week.   CO Q 10 PO Take by mouth.   dorzolamide-timolol 22.3-6.8 MG/ML ophthalmic solution Commonly known as: COSOPT Place 1 drop into both eyes 2 (two) times daily.   Eliquis 5 MG Tabs tablet Generic drug: apixaban TAKE 1 TABLET(5 MG) BY MOUTH TWICE DAILY   guaiFENesin 100 MG/5ML Soln Commonly known as: ROBITUSSIN Take 5 mLs (100 mg total) by mouth every 4 (four) hours as needed for cough or to loosen phlegm.   halobetasol 0.05 % cream Commonly known as: ULTRAVATE Apply to chest area daily   latanoprost 0.005 % ophthalmic solution Commonly known as: XALATAN Place 1 drop into both eyes at bedtime.   Multi For Him 50+ Tabs Take 1 tablet by mouth daily.    nitroGLYCERIN 0.4 MG SL tablet Commonly known as: NITROSTAT Place 1 tablet under the tongue See admin instructions. As needed   Probiotic 250 MG Caps Take 250 mg by mouth daily.        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   in situ-mid upper chest (txpbx)   Stroke ([place]) 2017   mild cva   Stroke [facility]) 2016    Past Surgical History:  Procedure Laterality Date   COLONOSCOPY N/A 08/22/2016   Procedure: COLONOSCOPY;  Surgeon: Garlan Fair, MD;  Location: [facility];  Service: Endoscopy;  Laterality: N/A;   colonscopy  2007   EYE SURGERY     bilateral cataract surgery   HERNIA REPAIR  1960   HERNIA REPAIR     INGUINAL HERNIA REPAIR  08/12/2011   Procedure: LAPAROSCOPIC INGUINAL HERNIA;   Surgeon: [name], MD;  Location: [place];  Service: General;  Laterality: Left;   [place]   left    LEFT HEART CATHETERIZATION WITH CORONARY ANGIOGRAM N/A 05/06/2014   Procedure: LEFT HEART CATHETERIZATION WITH CORONARY ANGIOGRAM;  Surgeon: [name], MD;  Location: [facility] CATH LAB;  Service: Cardiovascular;  Laterality: N/A;  LYMPHADENECTOMY Bilateral 09/06/2019   Procedure: LYMPHADENECTOMY;  Surgeon: Alexis Frock, MD;  Location: [facility];  Service: Urology;  Laterality: Bilateral;   ROBOT ASSISTED LAPAROSCOPIC RADICAL PROSTATECTOMY N/A 09/06/2019   Procedure: XI ROBOTIC ASSISTED LAPAROSCOPIC RADICAL PROSTATECTOMY;  Surgeon: Alexis Frock, MD;  Location: [facility];  Service: Urology;  Laterality: N/A;  3 HRS   TONSILLECTOMY     as a child    Family History  Problem Relation Age of Onset   Pulmonary embolism Mother    Kidney failure Mother    Hypertension Mother    Kidney Stones Mother    Pulmonary embolism Father    Prostate cancer Father    Pulmonary embolism Maternal Grandmother    Pulmonary embolism Maternal Grandfather    Pulmonary embolism Paternal Grandmother    Pulmonary embolism Paternal Grandfather     Social History:  reports that he quit smoking about 55 years ago. His smoking use included cigarettes. He has a 1.50 pack-year smoking history. He has never used smokeless tobacco. He reports that he does not drink alcohol and does not use drugs.  ROS   Cholesterol lower with vegan diet   General Examination:   BP 112/76   Pulse (!) 51   Ht 6' (1.829 m)   Wt 175 lb 6.4 oz (79.6 kg)   SpO2 97%   BMI 23.79 kg/m     Assessment/ Plan:  PROLACTINOMA: Has a history of a 1.4 cm pituitary tumor at baseline in 2003 and his initial prolactin level was high at 99  His initial symptoms were mostly related to decreased libido  Had been well controlled with low doses of bromocriptine for several years now Has been now on bromocriptine 1.25 mg, taking at  bedtime on Mondays/Wednesdays and Fridays  With a trial of stopping his bromocriptine previously his prolactin has gone up to 43.6 compared to 10.4 However prolactin is low normal with even a reduced dose of bromocriptine now  Last MRI of brain did not show any gross pituitary enlargement  We will try to leave off the Parlodel from now for 2 months and come in for fasting prolactin labs  Follow-up in 6 months   Eulises Kijowski 01/20/2022, 10:59 AM

## 2022-02-03 DIAGNOSIS — H401131 Primary open-angle glaucoma, bilateral, mild stage: Secondary | ICD-10-CM | POA: Diagnosis not present

## 2022-02-07 DIAGNOSIS — Z23 Encounter for immunization: Secondary | ICD-10-CM | POA: Diagnosis not present

## 2022-02-08 DIAGNOSIS — I10 Essential (primary) hypertension: Secondary | ICD-10-CM | POA: Diagnosis not present

## 2022-02-08 DIAGNOSIS — I2510 Atherosclerotic heart disease of native coronary artery without angina pectoris: Secondary | ICD-10-CM | POA: Diagnosis not present

## 2022-02-09 DIAGNOSIS — Z79899 Other long term (current) drug therapy: Secondary | ICD-10-CM | POA: Diagnosis not present

## 2022-02-09 DIAGNOSIS — I6930 Unspecified sequelae of cerebral infarction: Secondary | ICD-10-CM | POA: Diagnosis not present

## 2022-02-09 DIAGNOSIS — M85879 Other specified disorders of bone density and structure, unspecified ankle and foot: Secondary | ICD-10-CM | POA: Diagnosis not present

## 2022-03-02 ENCOUNTER — Other Ambulatory Visit: Payer: Self-pay | Admitting: Internal Medicine

## 2022-03-02 DIAGNOSIS — M85852 Other specified disorders of bone density and structure, left thigh: Secondary | ICD-10-CM | POA: Diagnosis not present

## 2022-03-02 DIAGNOSIS — M85851 Other specified disorders of bone density and structure, right thigh: Secondary | ICD-10-CM | POA: Diagnosis not present

## 2022-03-02 DIAGNOSIS — M85879 Other specified disorders of bone density and structure, unspecified ankle and foot: Secondary | ICD-10-CM

## 2022-03-08 DIAGNOSIS — D6859 Other primary thrombophilia: Secondary | ICD-10-CM | POA: Diagnosis not present

## 2022-03-08 DIAGNOSIS — I2510 Atherosclerotic heart disease of native coronary artery without angina pectoris: Secondary | ICD-10-CM | POA: Diagnosis not present

## 2022-03-08 DIAGNOSIS — I10 Essential (primary) hypertension: Secondary | ICD-10-CM | POA: Diagnosis not present

## 2022-03-08 DIAGNOSIS — Z23 Encounter for immunization: Secondary | ICD-10-CM | POA: Diagnosis not present

## 2022-03-08 DIAGNOSIS — Z8673 Personal history of transient ischemic attack (TIA), and cerebral infarction without residual deficits: Secondary | ICD-10-CM | POA: Diagnosis not present

## 2022-03-08 DIAGNOSIS — Z86711 Personal history of pulmonary embolism: Secondary | ICD-10-CM | POA: Diagnosis not present

## 2022-03-22 ENCOUNTER — Other Ambulatory Visit (INDEPENDENT_AMBULATORY_CARE_PROVIDER_SITE_OTHER): Payer: Medicare Other

## 2022-03-22 DIAGNOSIS — D352 Benign neoplasm of pituitary gland: Secondary | ICD-10-CM | POA: Diagnosis not present

## 2022-03-23 LAB — PROLACTIN: Prolactin: 27.4 ng/mL — ABNORMAL HIGH (ref 4.0–15.2)

## 2022-03-30 ENCOUNTER — Telehealth: Payer: Self-pay | Admitting: Endocrinology

## 2022-03-30 NOTE — Progress Notes (Signed)
Please call: Prolactin is high and he can go back to the previous regimen of bromocriptine

## 2022-03-30 NOTE — Telephone Encounter (Signed)
Patient is calling for his bloodwork results from 03/22/2022.

## 2022-03-30 NOTE — Telephone Encounter (Signed)
Patient informed of results and expressed understanding

## 2022-05-23 ENCOUNTER — Ambulatory Visit: Payer: Medicare Other | Admitting: Cardiology

## 2022-06-15 DIAGNOSIS — Z1331 Encounter for screening for depression: Secondary | ICD-10-CM | POA: Diagnosis not present

## 2022-06-15 DIAGNOSIS — Z23 Encounter for immunization: Secondary | ICD-10-CM | POA: Diagnosis not present

## 2022-06-15 DIAGNOSIS — Z0000 Encounter for general adult medical examination without abnormal findings: Secondary | ICD-10-CM | POA: Diagnosis not present

## 2022-06-15 DIAGNOSIS — Z86711 Personal history of pulmonary embolism: Secondary | ICD-10-CM | POA: Diagnosis not present

## 2022-06-15 DIAGNOSIS — D6859 Other primary thrombophilia: Secondary | ICD-10-CM | POA: Diagnosis not present

## 2022-06-15 DIAGNOSIS — I2510 Atherosclerotic heart disease of native coronary artery without angina pectoris: Secondary | ICD-10-CM | POA: Diagnosis not present

## 2022-06-15 DIAGNOSIS — I10 Essential (primary) hypertension: Secondary | ICD-10-CM | POA: Diagnosis not present

## 2022-06-15 DIAGNOSIS — I6930 Unspecified sequelae of cerebral infarction: Secondary | ICD-10-CM | POA: Diagnosis not present

## 2022-06-15 DIAGNOSIS — Z8673 Personal history of transient ischemic attack (TIA), and cerebral infarction without residual deficits: Secondary | ICD-10-CM | POA: Diagnosis not present

## 2022-06-15 DIAGNOSIS — R001 Bradycardia, unspecified: Secondary | ICD-10-CM | POA: Diagnosis not present

## 2022-06-15 DIAGNOSIS — E049 Nontoxic goiter, unspecified: Secondary | ICD-10-CM | POA: Diagnosis not present

## 2022-06-15 DIAGNOSIS — C61 Malignant neoplasm of prostate: Secondary | ICD-10-CM | POA: Diagnosis not present

## 2022-06-15 DIAGNOSIS — Z7901 Long term (current) use of anticoagulants: Secondary | ICD-10-CM | POA: Diagnosis not present

## 2022-06-15 DIAGNOSIS — M85879 Other specified disorders of bone density and structure, unspecified ankle and foot: Secondary | ICD-10-CM | POA: Diagnosis not present

## 2022-06-24 DIAGNOSIS — I2510 Atherosclerotic heart disease of native coronary artery without angina pectoris: Secondary | ICD-10-CM | POA: Diagnosis not present

## 2022-07-05 DIAGNOSIS — C61 Malignant neoplasm of prostate: Secondary | ICD-10-CM | POA: Diagnosis not present

## 2022-07-11 ENCOUNTER — Other Ambulatory Visit: Payer: Self-pay

## 2022-07-11 DIAGNOSIS — D352 Benign neoplasm of pituitary gland: Secondary | ICD-10-CM

## 2022-07-11 MED ORDER — BROMOCRIPTINE MESYLATE 2.5 MG PO TABS
1.2500 mg | ORAL_TABLET | ORAL | 3 refills | Status: DC
Start: 2022-07-11 — End: 2023-09-11

## 2022-07-12 DIAGNOSIS — N5231 Erectile dysfunction following radical prostatectomy: Secondary | ICD-10-CM | POA: Diagnosis not present

## 2022-07-12 DIAGNOSIS — C61 Malignant neoplasm of prostate: Secondary | ICD-10-CM | POA: Diagnosis not present

## 2022-07-12 DIAGNOSIS — N393 Stress incontinence (female) (male): Secondary | ICD-10-CM | POA: Diagnosis not present

## 2022-07-13 ENCOUNTER — Encounter: Payer: Self-pay | Admitting: Cardiology

## 2022-07-13 ENCOUNTER — Telehealth: Payer: Self-pay | Admitting: Pharmacist

## 2022-07-13 ENCOUNTER — Ambulatory Visit: Payer: Medicare Other | Attending: Cardiology | Admitting: Cardiology

## 2022-07-13 VITALS — BP 122/68 | HR 60 | Ht 72.0 in | Wt 185.6 lb

## 2022-07-13 DIAGNOSIS — I2510 Atherosclerotic heart disease of native coronary artery without angina pectoris: Secondary | ICD-10-CM | POA: Diagnosis not present

## 2022-07-13 DIAGNOSIS — I428 Other cardiomyopathies: Secondary | ICD-10-CM

## 2022-07-13 NOTE — Progress Notes (Unsigned)
Cardiology Office Note:    Date:  07/14/2022   ID:  Jay Day, DOB 1941-02-27, MRN [id number]  PCP:  Leeroy Cha, MD   [facility] Providers Cardiologist:  [name], MD     Referring MD: Leeroy Cha,*    History of Present Illness:    Jay Day is a 82 y.o. 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    Prior note: He presented to the emergency room on 03/20/2021 after an unwitnessed fall in the bathroom.  EMS reports that the toilet broke in half.  Wife states patient hit his head on the sink.  He remembers going to the bathroom to have a bowel movement and the next thing he knows he was unsteady on the toilet.  Denied any preceding dizziness and lightheadedness.  EMS found him to be sitting on the toilet and had an episode of bradycardia with loss of consciousness while checking his vital signs.  They reported he was unresponsive for about 30 seconds.  Heart rate per report was down in the 40s.  He does not recall this. No chest pain or shortness of breath.  He appeared very pale, eyes were closed.  He was also diagnosed with COVID 2 days prior to this incident.  [place] took art course for 4 days.  He is also switched his diet to mostly vegan, has lost few pounds.  In review of older notes: Back in June 2021 he sustained a fall, was nauseated, briefly passed out this was while sitting on the toilet. Vasovagal type symptoms.   Had another episode on the toilet.  Was wearing bike helmet. Nausea sensation. As starting to stand up, holding on the door and lost it.  Vasovagal once again.  All in middle of night, sitting on toilet, quick, woke up in one minute.   Called again in April 2023 with after being up for 5 to 10 minutes developed dizziness nausea slight headache went down for breakfast felt like he was  going to pass out he did ultimately fall.  He laid on the ground for a few minutes to pass symptoms.  Going to [place] 09/[place]  Ca score 400.  With atrial fibrillation we are continuing Eliquis 5 mg twice a day.    Vegan diet, Crestor 5 mg Monday Wednesday Friday.  Did particle numbers/LDL particle size with Dr. Clayton Bibles his PCP.  Had [facility] screening.  Swimming feels like lungs having hard time taking air in.  Occasionally will have some chest discomfort left-sided.  Remember CT scan coronary arteries showed no evidence of flow-limiting disease.  I showed him pictures of his coronary CT.   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   in situ-mid upper chest (txpbx)   Stroke ([place]) 2017   mild cva   Stroke [facility]) 2016    Past Surgical History:  Procedure Laterality Date   COLONOSCOPY N/A 08/22/2016   Procedure: COLONOSCOPY;  Surgeon: Garlan Fair, MD;  Location: [facility];  Service: Endoscopy;   Laterality: N/A;   colonscopy  2007   EYE SURGERY     bilateral cataract surgery   HERNIA REPAIR  1960   HERNIA REPAIR     INGUINAL HERNIA REPAIR  08/12/2011   Procedure: LAPAROSCOPIC INGUINAL HERNIA;  Surgeon: [name], MD;  Location: [facility];  Service: General;  Laterality: Left;   [place]   left    LEFT HEART CATHETERIZATION WITH CORONARY ANGIOGRAM N/A 05/06/2014   Procedure: LEFT HEART CATHETERIZATION WITH CORONARY ANGIOGRAM;  Surgeon: [name], MD;  Location: [facility] CATH LAB;  Service: Cardiovascular;  Laterality: N/A;   LYMPHADENECTOMY Bilateral 09/06/2019   Procedure: LYMPHADENECTOMY;  Surgeon: Alexis Frock, MD;  Location: [facility];  Service: Urology;  Laterality: Bilateral;   ROBOT ASSISTED LAPAROSCOPIC RADICAL PROSTATECTOMY N/A 09/06/2019   Procedure: XI ROBOTIC ASSISTED LAPAROSCOPIC RADICAL PROSTATECTOMY;  Surgeon: Alexis Frock, MD;  Location: [facility];  Service: Urology;  Laterality: N/A;  3 HRS   TONSILLECTOMY     as a child    Current Medications: Current Meds  Medication Sig   apixaban (ELIQUIS) 5 MG TABS tablet TAKE 1 TABLET(5 MG) BY MOUTH TWICE DAILY   bromocriptine (PARLODEL) 2.5 MG tablet Take 0.5 tablets (1.25 mg total) by mouth 3 (three) times a week.   dorzolamide-timolol (COSOPT) 22.3-6.8 MG/ML ophthalmic solution Place 1 drop into both eyes 2 (two) times daily.    guaiFENesin (ROBITUSSIN) 100 MG/5ML SOLN Take 5 mLs (100 mg total) by mouth every 4 (four) hours as needed for cough or to loosen phlegm.   halobetasol (ULTRAVATE) 0.05 % cream Apply to chest area daily   latanoprost (XALATAN) 0.005 % ophthalmic solution Place 1 drop into both eyes at bedtime.   Multiple Vitamins-Minerals (MULTI FOR HIM 50+) TABS Take 1 tablet by mouth daily.   nitroGLYCERIN (NITROSTAT) 0.4 MG SL tablet Place 1 tablet under the tongue See admin instructions. As needed   rosuvastatin (CRESTOR) 5 MG tablet Take 5 mg by mouth 3 (three) times a week. 1 tablet by mouth 3 times  weekly   Saccharomyces boulardii (PROBIOTIC) 250 MG CAPS Take 250 mg by mouth daily.     Allergies:   Gluten meal and Wheat bran   Social History   Socioeconomic History   Marital status: Married    Spouse name: Not on file   Number of children: 2   Years of education: BA   Highest education level: Not on file  Occupational History   Occupation: [employer]  Tobacco Use   Smoking status: Former    Packs/day: 0.30    Years: 5.00    Total pack years: 1.50  Types: Cigarettes    Quit date: 32    Years since quitting: 56.1   Smokeless tobacco: Never  Vaping Use   Vaping Use: Never used  Substance and Sexual Activity   Alcohol use: Never   Drug use: Never   Sexual activity: Not on file  Other Topics Concern   Not on file  Social History Narrative   ** Merged History Encounter **       Patient drinks about 2 cups of caffeine daily. Patient is left handed.   Social Determinants of Health   Financial Resource Strain: Not on file  Food Insecurity: Not on file  Transportation Needs: Not on file  Physical Activity: Not on file  Stress: Not on file  Social Connections: Not on file     Family History: The patient's family history includes Hypertension in his mother; Kidney Stones in his mother; Kidney failure in his mother; Prostate cancer in his father; Pulmonary embolism in his father, maternal grandfather, maternal grandmother, mother, paternal grandfather, and paternal grandmother.  ROS:   Please see the history of present illness.     All other systems reviewed and are negative.  EKGs/Labs/Other Studies Reviewed:    The following studies were reviewed today: ER notes, labs, xray  EKG: 07/13/2022 shows sinus bradycardia 57 PR interval 208 ms.  Cardiac CT: 1. Coronary calcium score of 400. This was 54th percentile for age and sex matched control.   2. Normal coronary origin with right dominance.   3. Nonobstructive CAD, with mixed plaque in proximal  LAD causing mild (25-49%) stenosis. Calcified plaque in mid LAD causes mild (25-49%) stenosis.   4.  LV apical aneurysm with small apical thrombus   5. LAD stenosis appears mild, but given location in proximal LAD and considering LV apical aneurysm, will send for CTFFR to exclude obstructive CAD  Recent Labs: No results found for requested labs within last 365 days.  Recent Lipid Panel    Component Value Date/Time   CHOL 111 05/12/2020 0838   TRIG 65 05/12/2020 0838   HDL 53 05/12/2020 0838   CHOLHDL 2.1 05/12/2020 0838   CHOLHDL 4.0 09/14/2015 0618   VLDL 39 09/14/2015 0618   LDLCALC 44 05/12/2020 0838     Risk Assessment/Calculations:          Physical Exam:    VS:  BP 122/68   Pulse 60   Ht 6' (1.829 m)   Wt 185 lb 9.6 oz (84.2 kg)   SpO2 99%   BMI 25.17 kg/m     Wt Readings from Last 3 Encounters:  07/13/22 185 lb 9.6 oz (84.2 kg)  01/20/22 175 lb 6.4 oz (79.6 kg)  07/21/21 177 lb (80.3 kg)     GEN: Well nourished, well developed, in no acute distress HEENT: normal Neck: no JVD, carotid bruits, or masses Cardiac: RRR; no murmurs, rubs, or gallops,no edema  Respiratory:  clear to auscultation bilaterally, normal work of breathing GI: soft, nontender, nondistended, + BS MS: no deformity or atrophy Skin: warm and dry, no rash Neuro:  Alert and Oriented x 3, Strength and sensation are intact Psych: euthymic mood, full affect   ASSESSMENT:    1. Coronary artery disease involving native coronary artery of native heart without angina pectoris   2. NICM (nonischemic cardiomyopathy) ([place])     PLAN:    In order of problems listed above:   Syncope Recurrent syncope.  Likely vasovagal type in etiology given its concurrence with using the bathroom.  In the past I have encouraged EP evaluation for him.  He has not had a recent episode.  CAD (coronary artery disease) Prior cardiac catheterization in 2015 showed nonobstructive calcific coronary artery  disease.  Recent coronary calcium score was done which was over 400.  Coronary CT scan reviewed as well.  Nonflow limiting FFR.  Showed him images.  I would like for him to be on aggressive lipid-lowering management.  Rosuvastatin 5 mg 3 times a week.  He has read a lot about statins and the negative effects.  We discussed PCSK9 inhibitors.  I will go ahead and prescribe this for him, Repatha and have our pharmacy team to help with this.  LDL goal is less than 55 with his prior stroke heart attack as well as coronary artery disease.  He is doing an excellent job with a vegan diet.  Current LDL is 103.    Nonischemic cardiomyopathy -Echo showed 45 to 50% EF in 2015.  In 2021 this was repeated after his syncopal episode and it was 50 to 60% with LV apical aneurysm noted with prior thrombus, prior MI.  Personally reviewed echocardiogram.   Stroke 2017 -No recurrence.  Continue aggressive risk factor modification   DVT PE LV apical aneurysm with prior thrombus seen on CT scan 04/19/2021 coronary CT. -These were recurrent continuing with Eliquis 5 mg twice a day.   Aortic atherosclerosis -Risk factor modification.         Medication Adjustments/Labs and Tests Ordered: Current medicines are reviewed at length with the patient today.  Concerns regarding medicines are outlined above.  Orders Placed This Encounter  Procedures   EKG 12-Lead   No orders of the defined types were placed in this encounter.    Patient Instructions  Medication Instructions:  You will be contacted by our pharmacy team about starting [place]. Continue all other medications as listed.  *If you need a refill on your cardiac medications before your next appointment, please call your pharmacy*   Follow-Up: At [facility], you and your health needs are our priority.  As part of our continuing mission to provide you with exceptional heart care, we have created designated Provider Care Teams.  These Care Teams  include your primary Cardiologist (physician) and Advanced Practice Providers (APPs -  Physician Assistants and Nurse Practitioners) who all work together to provide you with the care you need, when you need it.  We recommend signing up for the patient portal called "MyChart".  Sign up information is provided on this After Visit Summary.  MyChart is used to connect with patients for Virtual Visits (Telemedicine).  Patients are able to view lab/test results, encounter notes, upcoming appointments, etc.  Non-urgent messages can be sent to your provider as well.   To learn more about what you can do with MyChart, go to [email or web address].    Your next appointment:   1 year(s)  Provider:   [name], MD     Evolocumab Injection (Repatha) What is this medication? EVOLOCUMAB (e voe LOK ue mab) treats high cholesterol. It may also be used to lower the risk of heart attack, stroke, and a type of heart surgery. It works by decreasing bad cholesterol (such as LDL) in your blood. It is a monoclonal antibody. Changes to diet and exercise are often combined with this medication. This medicine may be used for other purposes; ask your health care provider or pharmacist if you have questions. COMMON BRAND NAME(S): Repatha, Repatha SureClick What should I  tell my care team before I take this medication? They need to know if you have any of these conditions: An unusual or allergic reaction to evolocumab, latex, other medications, foods, dyes, or preservatives Pregnant or trying to get pregnant Breast-feeding How should I use this medication? This medication is injected under the skin. You will be taught how to prepare and give it. Take it as directed on the prescription label at the same time every day. Keep taking it unless your care team tells you to stop. It is important that you put your used needles and syringes in a special sharps container. Do not put them in a trash can. If you do not have a  sharps container, call your pharmacist or care team to get one. This medication comes with INSTRUCTIONS FOR USE. Ask your pharmacist for directions on how to use this medication. Read the information carefully. Talk to your pharmacist or care team if you have questions. Talk to your care team about the use of this medication in children. While it may be prescribed for children as young as 10 years for selected conditions, precautions do apply. Overdosage: If you think you have taken too much of this medicine contact a poison control center or emergency room at once. NOTE: This medicine is only for you. Do not share this medicine with others. What if I miss a dose? It is important not to miss any doses. Talk to your care team about what to do if you miss a dose. What may interact with this medication? Interactions are not expected. This list may not describe all possible interactions. Give your health care provider a list of all the medicines, herbs, non-prescription drugs, or dietary supplements you use. Also tell them if you smoke, drink alcohol, or use illegal drugs. Some items may interact with your medicine. What should I watch for while using this medication? Visit your care team for regular checks on your progress. Tell your care team if your symptoms do not start to get better or if they get worse. You may need blood work while you are taking this medication. Do not wear the on-body infuser during an MRI. Taking this medication is only part of a total heart healthy program. Ask your care team if there are other changes you can make to improve your overall health. What side effects may I notice from receiving this medication? Side effects that you should report to your care team as soon as possible: Allergic reactions or angioedema--skin rash, itching or hives, swelling of the face, eyes, lips, tongue, arms, or legs, trouble swallowing or breathing Side effects that usually do not require  medical attention (report to your care team if they continue or are bothersome): Back pain Flu-like symptoms--fever, chills, muscle pain, cough, headache, fatigue Pain, redness, or irritation at injection site Runny or stuffy nose Sore throat This list may not describe all possible side effects. Call your doctor for medical advice about side effects. You may report side effects to FDA at 1-800-FDA-1088. Where should I keep my medication? Keep out of the reach of children and pets. Store in a refrigerator or at room temperature between 20 and 25 degrees C (68 and 77 degrees F). Refrigeration (preferred): Store it in the refrigerator. Do not freeze. Keep it in the original carton until you are ready to take it. Remove the dose from the carton about 30 minutes before it is time for you to take it. Get rid of any unused medication after  the expiration date. Room temperature: This medication may be stored at room temperature for up to 30 days. Keep it in the original carton until you are ready to take it. If it is stored at room temperature, get rid of any unused medication after 30 days or after it expires, whichever is first. Protect from light. Do not shake. Avoid exposure to extreme heat. To get rid of medications that are no longer needed or have expired: Take the medication to a medication take-back program. Check with your pharmacy or law enforcement to find a location. If you cannot return the medication, ask your pharmacist or care team how to get rid of this medication safely. NOTE: This sheet is a summary. It may not cover all possible information. If you have questions about this medicine, talk to your doctor, pharmacist, or health care provider.  2023 Elsevier/Gold Standard (2021-06-01 00:00:00)    Signed, [name], MD  07/14/2022 5:49 AM    [facility]

## 2022-07-13 NOTE — Telephone Encounter (Signed)
-----  Message from Shellia Cleverly, RN sent at 07/13/2022  9:03 AM EST ----- Regarding: Repatha Good morning,  Dr Marlou Porch has requested this pt be started on [place].  He would appreciate your assistance.  Pt is aware he will be contacted.  Thank you  Pam

## 2022-07-13 NOTE — Patient Instructions (Addendum)
Medication Instructions:  You will be contacted by our pharmacy team about starting [place]. Continue all other medications as listed.  *If you need a refill on your cardiac medications before your next appointment, please call your pharmacy*   Follow-Up: At [facility], you and your health needs are our priority.  As part of our continuing mission to provide you with exceptional heart care, we have created designated Provider Care Teams.  These Care Teams include your primary Cardiologist (physician) and Advanced Practice Providers (APPs -  Physician Assistants and Nurse Practitioners) who all work together to provide you with the care you need, when you need it.  We recommend signing up for the patient portal called "MyChart".  Sign up information is provided on this After Visit Summary.  MyChart is used to connect with patients for Virtual Visits (Telemedicine).  Patients are able to view lab/test results, encounter notes, upcoming appointments, etc.  Non-urgent messages can be sent to your provider as well.   To learn more about what you can do with MyChart, go to [email or web address].    Your next appointment:   1 year(s)  Provider:   [name], MD     Evolocumab Injection (Repatha) What is this medication? EVOLOCUMAB (e voe LOK ue mab) treats high cholesterol. It may also be used to lower the risk of heart attack, stroke, and a type of heart surgery. It works by decreasing bad cholesterol (such as LDL) in your blood. It is a monoclonal antibody. Changes to diet and exercise are often combined with this medication. This medicine may be used for other purposes; ask your health care provider or pharmacist if you have questions. COMMON BRAND NAME(S): Repatha, Repatha SureClick What should I tell my care team before I take this medication? They need to know if you have any of these conditions: An unusual or allergic reaction to evolocumab, latex, other medications, foods,  dyes, or preservatives Pregnant or trying to get pregnant Breast-feeding How should I use this medication? This medication is injected under the skin. You will be taught how to prepare and give it. Take it as directed on the prescription label at the same time every day. Keep taking it unless your care team tells you to stop. It is important that you put your used needles and syringes in a special sharps container. Do not put them in a trash can. If you do not have a sharps container, call your pharmacist or care team to get one. This medication comes with INSTRUCTIONS FOR USE. Ask your pharmacist for directions on how to use this medication. Read the information carefully. Talk to your pharmacist or care team if you have questions. Talk to your care team about the use of this medication in children. While it may be prescribed for children as young as 10 years for selected conditions, precautions do apply. Overdosage: If you think you have taken too much of this medicine contact a poison control center or emergency room at once. NOTE: This medicine is only for you. Do not share this medicine with others. What if I miss a dose? It is important not to miss any doses. Talk to your care team about what to do if you miss a dose. What may interact with this medication? Interactions are not expected. This list may not describe all possible interactions. Give your health care provider a list of all the medicines, herbs, non-prescription drugs, or dietary supplements you use. Also tell them if you smoke, drink alcohol,  or use illegal drugs. Some items may interact with your medicine. What should I watch for while using this medication? Visit your care team for regular checks on your progress. Tell your care team if your symptoms do not start to get better or if they get worse. You may need blood work while you are taking this medication. Do not wear the on-body infuser during an MRI. Taking this medication  is only part of a total heart healthy program. Ask your care team if there are other changes you can make to improve your overall health. What side effects may I notice from receiving this medication? Side effects that you should report to your care team as soon as possible: Allergic reactions or angioedema--skin rash, itching or hives, swelling of the face, eyes, lips, tongue, arms, or legs, trouble swallowing or breathing Side effects that usually do not require medical attention (report to your care team if they continue or are bothersome): Back pain Flu-like symptoms--fever, chills, muscle pain, cough, headache, fatigue Pain, redness, or irritation at injection site Runny or stuffy nose Sore throat This list may not describe all possible side effects. Call your doctor for medical advice about side effects. You may report side effects to FDA at 1-800-FDA-1088. Where should I keep my medication? Keep out of the reach of children and pets. Store in a refrigerator or at room temperature between 20 and 25 degrees C (68 and 77 degrees F). Refrigeration (preferred): Store it in the refrigerator. Do not freeze. Keep it in the original carton until you are ready to take it. Remove the dose from the carton about 30 minutes before it is time for you to take it. Get rid of any unused medication after the expiration date. Room temperature: This medication may be stored at room temperature for up to 30 days. Keep it in the original carton until you are ready to take it. If it is stored at room temperature, get rid of any unused medication after 30 days or after it expires, whichever is first. Protect from light. Do not shake. Avoid exposure to extreme heat. To get rid of medications that are no longer needed or have expired: Take the medication to a medication take-back program. Check with your pharmacy or law enforcement to find a location. If you cannot return the medication, ask your pharmacist or care  team how to get rid of this medication safely. NOTE: This sheet is a summary. It may not cover all possible information. If you have questions about this medicine, talk to your doctor, pharmacist, or health care provider.  2023 Elsevier/Gold Standard (2021-06-01 00:00:00)

## 2022-07-25 ENCOUNTER — Other Ambulatory Visit: Payer: Self-pay | Admitting: Endocrinology

## 2022-07-25 DIAGNOSIS — D352 Benign neoplasm of pituitary gland: Secondary | ICD-10-CM

## 2022-07-26 ENCOUNTER — Other Ambulatory Visit (INDEPENDENT_AMBULATORY_CARE_PROVIDER_SITE_OTHER): Payer: Medicare Other

## 2022-07-26 DIAGNOSIS — D352 Benign neoplasm of pituitary gland: Secondary | ICD-10-CM

## 2022-07-27 LAB — PROLACTIN: Prolactin: 7.6 ng/mL (ref 3.6–25.2)

## 2022-07-28 ENCOUNTER — Ambulatory Visit (INDEPENDENT_AMBULATORY_CARE_PROVIDER_SITE_OTHER): Payer: Medicare Other | Admitting: Endocrinology

## 2022-07-28 ENCOUNTER — Encounter: Payer: Self-pay | Admitting: Endocrinology

## 2022-07-28 VITALS — BP 126/80 | HR 70 | Ht 72.0 in | Wt 183.2 lb

## 2022-07-28 DIAGNOSIS — D352 Benign neoplasm of pituitary gland: Secondary | ICD-10-CM | POA: Diagnosis not present

## 2022-07-28 DIAGNOSIS — I2510 Atherosclerotic heart disease of native coronary artery without angina pectoris: Secondary | ICD-10-CM | POA: Diagnosis not present

## 2022-07-28 NOTE — Patient Instructions (Signed)
No change 

## 2022-07-28 NOTE — Progress Notes (Signed)
Patient ID: Jay Day, male   DOB: 1941/05/23, 82 y.o.   MRN: [id number]          Chief complaint:  Follow-up of prolactinoma tumor    History of Present Illness     Prior history: Apparently in 2003 he was being evaluated by his PCP for complaints of decreased libido. 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   He was treated by his internist with bromocriptine.  Because of his relatively low prolactin level with 2.5 mg of bromocriptine he was tried on half tablet in 2012 and prolactin was normal at 5.2 subsequently He had been taking 2.5 mg again for some time but his prolactin level in 11/2011 was only 2.0, in 7/14 was 1.9 and in 01/2014 his prolactin level was low at 1.6   Follow-up MRI in 04/2012 did not show any change in size of the tumor   RECENT HISTORY:  .  Patient has been treated long-term with low doses of bromocriptine   No evidence of hypopituitarism on his evaluation in 10/2014 MRI brain scan done for unrelated reasons in 06/2016 showed:    T2 hyperintense focus centered within the right aspect of the pituitary gland and cavernous sinus best appreciated on the coronal sequence is decreased in size from the 2003 pituitary MRI of the brain and probably represents residual adenoma. Description of pituitary not in MRI done in 07/2019  He has been on variable doses of bromocriptine in the last few years   When he was not taking bromocriptine his prolactin went up to 44 in 04/2019  Since his visit in 9/22 he had been taking bromocriptine 1.25 mg 3 days a week He was again given a trial of stopping bromocriptine in 8/23 but his prolactin went up to 27 Now he is back on this 3 times a week He takes his bromocriptine at bedtime and does not think it makes him drowsy    No complaints of fatigue or low energy He has occasional headaches on waking up but these are transient  Prolactin now is normal at 7.6  Testosterone level has been normal previously  Free T4 has been consistently normal  Lab Results  Component Value Date   PROLACTIN 7.6 07/26/2022   PROLACTIN 27.4 (H) 03/22/2022   PROLACTIN 6.1 01/18/2022   PROLACTIN 11.4 07/16/2021   PROLACTIN 4.0 02/25/2021   PROLACTIN 13.3 02/24/2020   PROLACTIN 9.5 08/19/2019   PROLACTIN 43.6 (H) 04/24/2019    Lab Results  Component Value Date   TSH 1.29 07/10/2018   FREET4 0.93 01/18/2022   FREET4 0.82 02/25/2021   FREET4 1.04 02/04/2019   Lab Results  Component Value Date   TESTOSTERONE 631.36 02/25/2021      Allergies as of 07/28/2022       Reactions   Gluten Meal Other (See Comments)   Gluten Sensitivity (reaction undefined) NO BREAD   Wheat Bran Other (See Comments)   Reaction undefined        Medication List        Accurate as of July 28, 2022 11:13 AM. If you have any questions, ask your nurse or doctor.          bromocriptine 2.5 MG tablet Commonly known as: PARLODEL Take 0.5 tablets (1.25 mg  total) by mouth 3 (three) times a week.   CO Q 10 PO Take by mouth.   dorzolamide-timolol 2-0.5 % ophthalmic solution Commonly known as: COSOPT Place 1 drop into both eyes 2 (two) times daily.   Eliquis 5 MG Tabs tablet Generic drug: apixaban TAKE 1 TABLET(5 MG) BY MOUTH TWICE DAILY   guaiFENesin 100 MG/5ML Soln Commonly known as: ROBITUSSIN Take 5 mLs (100 mg total) by mouth every 4 (four) hours as needed for cough or to loosen phlegm.   halobetasol 0.05 % cream Commonly known as: ULTRAVATE Apply to chest area daily   latanoprost 0.005 % ophthalmic solution Commonly known as: XALATAN Place 1 drop into both eyes at bedtime.   Multi For Him 50+ Tabs Take 1 tablet by mouth daily.   nitroGLYCERIN 0.4 MG SL tablet Commonly known as: NITROSTAT Place 1 tablet  under the tongue See admin instructions. As needed   Probiotic 250 MG Caps Take 250 mg by mouth daily.   rosuvastatin 5 MG tablet Commonly known as: CRESTOR Take 5 mg by mouth 3 (three) times a week. 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   in situ-mid upper chest (txpbx)   Stroke ([place]) 2017   mild cva   Stroke [facility]) 2016    Past Surgical History:  Procedure Laterality Date   COLONOSCOPY N/A 08/22/2016   Procedure: COLONOSCOPY;  Surgeon: Garlan Fair, MD;  Location: [facility];  Service: Endoscopy;  Laterality: N/A;   colonscopy  2007   EYE SURGERY     bilateral cataract surgery   HERNIA REPAIR  1960   HERNIA REPAIR     INGUINAL HERNIA REPAIR   08/12/2011   Procedure: LAPAROSCOPIC INGUINAL HERNIA;  Surgeon: [name], MD;  Location: [facility];  Service: General;  Laterality: Left;   [place]   left    LEFT HEART CATHETERIZATION WITH CORONARY ANGIOGRAM N/A 05/06/2014   Procedure: LEFT HEART CATHETERIZATION WITH CORONARY ANGIOGRAM;  Surgeon: [name], MD;  Location: [facility] CATH LAB;  Service: Cardiovascular;  Laterality: N/A;   LYMPHADENECTOMY Bilateral 09/06/2019   Procedure: LYMPHADENECTOMY;  Surgeon: Alexis Frock, MD;  Location: [facility];  Service: Urology;  Laterality: Bilateral;   ROBOT ASSISTED LAPAROSCOPIC RADICAL PROSTATECTOMY N/A 09/06/2019   Procedure: XI ROBOTIC ASSISTED LAPAROSCOPIC RADICAL PROSTATECTOMY;  Surgeon: Alexis Frock, MD;  Location: [facility];  Service: Urology;  Laterality: N/A;  3 HRS   TONSILLECTOMY     as a child    Family History  Problem Relation Age of Onset   Pulmonary embolism Mother    Kidney failure Mother    Hypertension Mother    Kidney Stones Mother    Pulmonary embolism Father    Prostate cancer Father    Pulmonary embolism Maternal Grandmother    Pulmonary embolism Maternal Grandfather    Pulmonary embolism Paternal Grandmother    Pulmonary embolism Paternal Grandfather     Social History:  reports that he quit smoking about 56 years ago. His smoking use included cigarettes. He has a 1.50 pack-year smoking history. He has never used smokeless tobacco. He reports that he does not drink alcohol and does not use drugs.  ROS   Nasal drainage: He says that he has longstanding running nose without congestion or allergy-like symptoms, not on treatment   General Examination:   BP 126/80 (BP Location: Left Arm, Patient Position: Sitting, Cuff Size: Normal)   Pulse 70   Ht 6' (1.829 m)   Wt 183 lb 3.2 oz (83.1 kg)   SpO2 95%   BMI 24.85 kg/m     Assessment/ Plan:  PROLACTINOMA: Has a history of a 1.4 cm pituitary tumor at baseline in 2003 and his initial prolactin level  was high at 99  His initial symptoms were mostly related to decreased libido  Had been well controlled with low doses of bromocriptine for several years now  Has been on bromocriptine 1.25 mg, taking at bedtime on Mondays/Wednesdays and Fridays  With a trial of stopping his bromocriptine his prolactin has gone up to as high as 43.6  Currently prolactin level is 7.6 on treatment  Last MRI of brain did not show any gross pituitary enlargement  Discussed that since he had a macroadenoma of the pituitary he will continue low-dose bromocriptine long-term  Follow-up in 6 months   Jader Desai 07/28/2022, 11:13 AM

## 2022-07-29 NOTE — Telephone Encounter (Signed)
Unclear why scheduling has not reached out to pt. I have called and scheduled pt for appt.

## 2022-08-01 NOTE — Progress Notes (Signed)
Patient ID: Jay Day                 DOB: January 30, 1941                    MRN: [id number]     HPI: Jay Day is a 82 y.o. male patient referred to lipid clinic by Dr Anne Fu. 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  Pt presents today for follow up. Stopped taking his low dose/frequency rosuvastatin before his labs were checked in January, doesn't remember exactly when he stopped taking it. Was experiencing joint pain on statin. Has also taken atorvastatin in the past, thinks he had the same side effects with that but not sure. Doesn't like taking medications in general. Not fasting this AM - had oatmeal with apples and blueberries. First fill of Eliquis cost him ~$500.  Current Medications: none Intolerances: rosuvastatin 2.5mg  daily, 10mg  daily - joint pain; atorvastatin 10mg  daily - doesn't remember, may have been joint pain as well Risk Factors: CAD, stroke, age, HTN LDL goal: 55mg /dL  Diet: Vegan diet.  Exercise: no SOB with activity, swims multiple days a week  Family History: Hypertension in his mother; Kidney Stones in his mother; Kidney failure in his mother; Prostate cancer in his father; Pulmonary embolism in his father, maternal grandfather, maternal grandmother, mother, paternal grandfather, and paternal grandmother.   Social History: Former tobacco use for 5 years, quit in 1968, no alcohol or drug use.  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 20 tablet 3   Coenzyme Q10 (CO Q 10 PO) Take by mouth.     dorzolamide-timolol (COSOPT) 22.3-6.8 MG/ML ophthalmic solution Place 1 drop into both eyes 2 (two) times daily.      guaiFENesin (ROBITUSSIN) 100 MG/5ML SOLN Take 5 mLs (100 mg total) by mouth every 4 (four) hours as needed for cough or to loosen phlegm. 236 mL 0   halobetasol (ULTRAVATE) 0.05 % cream Apply to chest area daily 50 g 2   latanoprost (XALATAN) 0.005 % ophthalmic solution Place 1 drop into both eyes at bedtime.     Multiple Vitamins-Minerals (MULTI FOR HIM 50+) TABS Take 1 tablet by mouth daily.     nitroGLYCERIN (NITROSTAT) 0.4 MG SL tablet Place 1 tablet under the tongue See admin instructions. As needed     rosuvastatin (CRESTOR) 5 MG tablet Take 5 mg by mouth 3 (three)  times a week. 1 tablet by mouth 3 times weekly     Saccharomyces boulardii (PROBIOTIC) 250 MG CAPS Take 250 mg by mouth daily.     No current facility-administered medications on file prior to visit.    Allergies  Allergen Reactions   Gluten Meal Other (See Comments)    Gluten Sensitivity (reaction undefined) NO BREAD   Wheat Bran Other (See Comments)    Reaction undefined    Assessment/Plan:  1. Hyperlipidemia - Baseline LDL 103 in January, pt reports worse diet over the holidays. Wishes to recheck labs today, will check advanced lipid panel per pt request. LDL goal < 55. Discussed statin rechallenge, ezetimibe, PCSK9i, and Leqvio today. Will discuss with pt once labs result depending on % of LDL lowering required to reach his goal.   Mattilyn Crites E. Supple, PharmD, BCACP, CPP [facility] [address] [place], [place] [address] Phone: [phone]; Fax: [phone] 08/03/2022 9:55 AM

## 2022-08-03 ENCOUNTER — Ambulatory Visit: Payer: Medicare Other | Attending: Internal Medicine | Admitting: Pharmacist

## 2022-08-03 ENCOUNTER — Encounter: Payer: Self-pay | Admitting: Pharmacist

## 2022-08-03 DIAGNOSIS — I2510 Atherosclerotic heart disease of native coronary artery without angina pectoris: Secondary | ICD-10-CM | POA: Diagnosis not present

## 2022-08-03 NOTE — Patient Instructions (Signed)
We'll recheck your cholesterol today  Your last LDL was 103 and your goal is < 55  We have a few medication options depending on your results: -Zetia - daily pill that lowers LDL by 20% -Repatha - twice monthly injection that lowers LDL by 60% -Leqvio - twice yearly injection (given at [facility]) that lowers LDL by 50%

## 2022-08-04 LAB — NMR, LIPOPROFILE
Cholesterol, Total: 149 mg/dL (ref 100–199)
HDL Particle Number: 30 umol/L — ABNORMAL LOW (ref >=30.5)
HDL-C: 48 mg/dL (ref >39)
LDL Particle Number: 1084 nmol/L — ABNORMAL HIGH (ref <1000)
LDL Size: 21.1 nm (ref >20.5)
LDL-C (NIH Calc): 84 mg/dL (ref 0–99)
LP-IR Score: <25 (ref <=45)
Small LDL Particle Number: 378 nmol/L (ref <=527)
Triglycerides: 91 mg/dL (ref 0–149)

## 2022-08-04 LAB — APOLIPOPROTEIN B: Apolipoprotein B: 77 mg/dL (ref <90)

## 2022-08-04 LAB — LIPOPROTEIN A (LPA): Lipoprotein (a): 31.1 nmol/L (ref <75.0)

## 2022-08-05 ENCOUNTER — Telehealth: Payer: Self-pay | Admitting: Pharmacist

## 2022-08-05 DIAGNOSIS — I2510 Atherosclerotic heart disease of native coronary artery without angina pectoris: Secondary | ICD-10-CM

## 2022-08-05 DIAGNOSIS — E782 Mixed hyperlipidemia: Secondary | ICD-10-CM

## 2022-08-05 DIAGNOSIS — H401131 Primary open-angle glaucoma, bilateral, mild stage: Secondary | ICD-10-CM | POA: Diagnosis not present

## 2022-08-05 NOTE — Telephone Encounter (Signed)
Baseline LDL improved from 103 to 84 with dietary changes. Goal < 55. 35% LDL reduction still needed. Slight discordance with his LDL particle # as well, goal is < 550 and requires ~50% reduction. ApoB and Lp(a) both normal.   Previously intolerant to rosuvastatin 2.'5mg'$  and '10mg'$  daily. Discussed statin rechallenge, ezetimibe, and Repatha at his visit. Left message for pt to see which option he's interested in trying.

## 2022-08-08 NOTE — Telephone Encounter (Signed)
Called pt and reviewed lab results. He states since appt with me, he restarted rosuvastatin '5mg'$  3x a week and he's been tolerating it well. This should bring cholesterol very close to goal, will continue as tolerated and repeat lipid panel in 2 months.

## 2022-08-25 ENCOUNTER — Other Ambulatory Visit: Payer: Medicare Other

## 2022-08-30 DIAGNOSIS — I2510 Atherosclerotic heart disease of native coronary artery without angina pectoris: Secondary | ICD-10-CM | POA: Diagnosis not present

## 2022-08-30 DIAGNOSIS — D7589 Other specified diseases of blood and blood-forming organs: Secondary | ICD-10-CM | POA: Diagnosis not present

## 2022-08-30 DIAGNOSIS — E559 Vitamin D deficiency, unspecified: Secondary | ICD-10-CM | POA: Diagnosis not present

## 2022-08-30 DIAGNOSIS — I10 Essential (primary) hypertension: Secondary | ICD-10-CM | POA: Diagnosis not present

## 2022-08-30 DIAGNOSIS — Z86711 Personal history of pulmonary embolism: Secondary | ICD-10-CM | POA: Diagnosis not present

## 2022-10-04 ENCOUNTER — Ambulatory Visit: Payer: Medicare Other | Attending: Endocrinology

## 2022-10-04 DIAGNOSIS — E782 Mixed hyperlipidemia: Secondary | ICD-10-CM

## 2022-10-05 LAB — LIPID PANEL
Chol/HDL Ratio: 2.5 ratio (ref 0.0–5.0)
Cholesterol, Total: 118 mg/dL (ref 100–199)
HDL: 48 mg/dL (ref >39)
LDL Chol Calc (NIH): 55 mg/dL (ref 0–99)
Triglycerides: 70 mg/dL (ref 0–149)
VLDL Cholesterol Cal: 15 mg/dL (ref 5–40)

## 2022-10-05 LAB — HEPATIC FUNCTION PANEL
ALT: 17 IU/L (ref 0–44)
AST: 29 IU/L (ref 0–40)
Albumin: 4.1 g/dL (ref 3.8–4.8)
Alkaline Phosphatase: 86 IU/L (ref 44–121)
Bilirubin Total: 0.7 mg/dL (ref 0.0–1.2)
Bilirubin, Direct: 0.23 mg/dL (ref 0.00–0.40)
Total Protein: 6.3 g/dL (ref 6.0–8.5)

## 2022-11-28 ENCOUNTER — Other Ambulatory Visit: Payer: Self-pay

## 2022-11-28 MED ORDER — APIXABAN 5 MG PO TABS: ORAL_TABLET | ORAL | 1 refills | Status: DC

## 2022-11-28 NOTE — Telephone Encounter (Signed)
Prescription refill request for Eliquis received. Indication: apical thrombus  Last office visit: 07/13/22 Anne Fu)  Scr: 1.2 (06/15/22 via [facility])  Age: 82 Weight: 83.1kg  Appropriate dose. Refill sent.

## 2022-12-21 ENCOUNTER — Other Ambulatory Visit (HOSPITAL_BASED_OUTPATIENT_CLINIC_OR_DEPARTMENT_OTHER): Payer: Self-pay | Admitting: Internal Medicine

## 2022-12-21 DIAGNOSIS — I10 Essential (primary) hypertension: Secondary | ICD-10-CM | POA: Diagnosis not present

## 2022-12-21 DIAGNOSIS — I2510 Atherosclerotic heart disease of native coronary artery without angina pectoris: Secondary | ICD-10-CM

## 2022-12-21 DIAGNOSIS — Z86711 Personal history of pulmonary embolism: Secondary | ICD-10-CM | POA: Diagnosis not present

## 2022-12-21 DIAGNOSIS — D6859 Other primary thrombophilia: Secondary | ICD-10-CM | POA: Diagnosis not present

## 2022-12-21 DIAGNOSIS — Z8673 Personal history of transient ischemic attack (TIA), and cerebral infarction without residual deficits: Secondary | ICD-10-CM | POA: Diagnosis not present

## 2023-01-03 ENCOUNTER — Ambulatory Visit (HOSPITAL_COMMUNITY)
Admission: RE | Admit: 2023-01-03 | Discharge: 2023-01-03 | Disposition: A | Discharge status: Home / Self Care | Payer: Medicare Other | Source: Ambulatory Visit | Attending: Internal Medicine | Admitting: Internal Medicine

## 2023-01-03 DIAGNOSIS — I2510 Atherosclerotic heart disease of native coronary artery without angina pectoris: Secondary | ICD-10-CM | POA: Insufficient documentation

## 2023-01-23 ENCOUNTER — Other Ambulatory Visit: Payer: Medicare Other

## 2023-01-26 ENCOUNTER — Other Ambulatory Visit (INDEPENDENT_AMBULATORY_CARE_PROVIDER_SITE_OTHER): Payer: Medicare Other

## 2023-01-26 ENCOUNTER — Ambulatory Visit: Payer: Medicare Other | Admitting: Endocrinology

## 2023-01-26 DIAGNOSIS — D352 Benign neoplasm of pituitary gland: Secondary | ICD-10-CM | POA: Diagnosis not present

## 2023-01-26 LAB — T4, FREE: Free T4: 0.93 ng/dL (ref 0.60–1.60)

## 2023-01-27 ENCOUNTER — Ambulatory Visit: Payer: Medicare Other | Admitting: Endocrinology

## 2023-01-27 LAB — PROLACTIN: Prolactin: 5.9 ng/mL (ref 3.6–25.2)

## 2023-01-30 ENCOUNTER — Ambulatory Visit: Payer: Medicare Other | Admitting: Endocrinology

## 2023-01-31 ENCOUNTER — Ambulatory Visit (INDEPENDENT_AMBULATORY_CARE_PROVIDER_SITE_OTHER): Payer: Medicare Other | Admitting: Endocrinology

## 2023-01-31 ENCOUNTER — Encounter: Payer: Self-pay | Admitting: Endocrinology

## 2023-01-31 VITALS — BP 115/75 | HR 52 | Ht 72.0 in | Wt 181.0 lb

## 2023-01-31 DIAGNOSIS — D352 Benign neoplasm of pituitary gland: Secondary | ICD-10-CM | POA: Diagnosis not present

## 2023-01-31 NOTE — Progress Notes (Signed)
Patient ID: Jay Day, male   DOB: February 19, 1941, 82 y.o.   MRN: [id number]         Chief complaint:  Follow-up of prolactinoma tumor    History of Present Illness     Prior history: Apparently in 2003 he was being evaluated by his PCP for complaints of decreased libido. 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   He was treated by his internist with bromocriptine.  Because of his relatively low prolactin level with 2.5 mg of bromocriptine he was tried on half tablet in 2012 and prolactin was normal at 5.2 subsequently He had been taking 2.5 mg again for some time but his prolactin level in 11/2011 was only 2.0, in 7/14 was 1.9 and in 01/2014 his prolactin level was low at 1.6   Follow-up MRI in 04/2012 did not show any change in size of the tumor   RECENT HISTORY:  .  Patient has been treated long-term with low doses of bromocriptine   No evidence of hypopituitarism on his evaluation in 10/2014 MRI brain scan done for unrelated reasons in 06/2016 showed:    T2 hyperintense focus centered within the right aspect of the pituitary gland and cavernous sinus best appreciated on the coronal sequence is decreased in size from the 2003 pituitary MRI of the brain and probably represents residual adenoma. Description of pituitary not in MRI done in 07/2019  He has been on variable doses of bromocriptine in the last few years   When he was not taking bromocriptine his prolactin went up to 44 in 04/2019 and up to 27 as of 10/23  Since his visit in 10/23 he had been taking bromocriptine 1.25 mg 3 days a week He is still taking the medication 3 times a week regularly he takes his bromocriptine at bedtime and does not think it makes him drowsy   No complaints of decreased energy level, no  headaches He does like to take a nap in the afternoon which is not new  Prolactin now is normal at 5.9 compared to 7.6  Testosterone level has been normal previously  Free T4 has been consistently normal  Lab Results  Component Value Date   PROLACTIN 5.9 01/26/2023   PROLACTIN 7.6 07/26/2022   PROLACTIN 27.4 (H) 03/22/2022   PROLACTIN 6.1 01/18/2022   PROLACTIN 11.4 07/16/2021   PROLACTIN 4.0 02/25/2021   PROLACTIN 13.3 02/24/2020   PROLACTIN 9.5 08/19/2019    Lab Results  Component Value Date   TSH 1.29 07/10/2018   FREET4 0.93 01/26/2023   FREET4 0.93 01/18/2022   FREET4 0.82 02/25/2021   Lab Results  Component Value Date   TESTOSTERONE 631.36 02/25/2021      Allergies as of 01/31/2023       Reactions   Gluten Meal Other (See Comments)   Gluten Sensitivity (reaction undefined) NO BREAD   Wheat Other (See Comments)   Reaction undefined        Medication List        Accurate as of January 31, 2023 11:45 AM. If you have any questions, ask your nurse or doctor.          apixaban 5 MG Tabs tablet Commonly known as: Eliquis TAKE 1 TABLET(5 MG) BY MOUTH TWICE DAILY  bromocriptine 2.5 MG tablet Commonly known as: PARLODEL Take 0.5 tablets (1.25 mg total) by mouth 3 (three) times a week.   CO Q 10 PO Take by mouth.   dorzolamide-timolol 2-0.5 % ophthalmic solution Commonly known as: COSOPT Place 1 drop into both eyes 2 (two) times daily.   guaiFENesin 100 MG/5ML Soln Commonly known as: ROBITUSSIN Take 5 mLs (100 mg total) by mouth every 4 (four) hours as needed for cough or to loosen phlegm.   halobetasol 0.05 % cream Commonly known as: ULTRAVATE Apply to chest area daily   latanoprost 0.005 % ophthalmic solution Commonly known as: XALATAN Place 1 drop into both eyes at bedtime.   Multi For Him 50+ Tabs Take 1 tablet by mouth daily.   nitroGLYCERIN 0.4 MG SL tablet Commonly known as: NITROSTAT Place 1 tablet under the tongue See admin  instructions. As needed   Probiotic 250 MG Caps Take 250 mg by mouth daily.   rosuvastatin 5 MG tablet Commonly known as: CRESTOR Take 5 mg by mouth 3 (three) times a week.        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   in situ-mid upper chest (txpbx)   Stroke (HCC) 2017   mild cva   Stroke [facility]) 2016    Past Surgical History:  Procedure Laterality Date   COLONOSCOPY N/A 08/22/2016   Procedure: COLONOSCOPY;  Surgeon: Charolett Bumpers, MD;  [facility];  Service: Endoscopy;  Laterality: N/A;   colonscopy  2007   EYE SURGERY     bilateral cataract surgery   HERNIA REPAIR  1960   HERNIA REPAIR     INGUINAL HERNIA REPAIR  08/12/2011   Procedure: LAPAROSCOPIC INGUINAL HERNIA;  Surgeon:  Ernestene Mention, MD;  Location: [facility] OR;  Service: General;  Laterality: Left;   KNEE SURGERY  1962   left    LEFT HEART CATHETERIZATION WITH CORONARY ANGIOGRAM N/A 05/06/2014   Procedure: LEFT HEART CATHETERIZATION WITH CORONARY ANGIOGRAM;  Surgeon: Donato Schultz, MD;  Location: [facility] CATH LAB;  Service: Cardiovascular;  Laterality: N/A;   LYMPHADENECTOMY Bilateral 09/06/2019   Procedure: LYMPHADENECTOMY;  Surgeon: Sebastian Ache, MD;  [facility];  Service: Urology;  Laterality: Bilateral;   ROBOT ASSISTED LAPAROSCOPIC RADICAL PROSTATECTOMY N/A 09/06/2019   Procedure: XI ROBOTIC ASSISTED LAPAROSCOPIC RADICAL PROSTATECTOMY;  Surgeon: Sebastian Ache, MD;  Location[facility];  Service: Urology;  Laterality: N/A;  3 HRS   TONSILLECTOMY     as a child    Family History  Problem Relation Age of Onset   Pulmonary embolism Mother    Kidney failure Mother    Hypertension Mother    Kidney Stones Mother    Pulmonary embolism Father    Prostate cancer Father    Pulmonary embolism Maternal Grandmother    Pulmonary embolism Maternal Grandfather    Pulmonary embolism Paternal Grandmother    Pulmonary embolism Paternal Grandfather     Social History:  reports that he quit smoking about 56 years ago. His smoking use included cigarettes. He started smoking about 61 years ago. He has a 1.5 pack-year smoking history. He has never used smokeless tobacco. He reports that he does not drink alcohol and does not use drugs.  ROS    General Examination:   BP 115/75   Pulse (!) 52   Ht 6' (1.829 m)   Wt 181 lb (82.1 kg)   SpO2 97%   BMI 24.55 kg/m     Assessment/ Plan:  PROLACTINOMA: Has a history of a 1.4 cm pituitary tumor at baseline in 2003 and his initial prolactin level was high at 99  His initial symptoms were mostly related to decreased libido  Has been on bromocriptine 1.25 mg, taking at bedtime on Mondays/Wednesdays and Fridays With this his prolactin is usually well-controlled and he  has no current symptoms  With previous trials of stopping his bromocriptine his prolactin has gone up to as high as 43.6  Currently prolactin level is 5.9 on treatment  Last MRI of brain did not show any obvious pituitary enlargement  Since  he had a macroadenoma of the pituitary he will continue low-dose bromocriptine long-term  Follow-up in 6 months   Jillyan Plitt 01/31/2023, 11:45 AM

## 2023-02-10 DIAGNOSIS — H401131 Primary open-angle glaucoma, bilateral, mild stage: Secondary | ICD-10-CM | POA: Diagnosis not present

## 2023-03-02 DIAGNOSIS — R300 Dysuria: Secondary | ICD-10-CM | POA: Diagnosis not present

## 2023-03-02 DIAGNOSIS — Z23 Encounter for immunization: Secondary | ICD-10-CM | POA: Diagnosis not present

## 2023-04-24 DIAGNOSIS — S0502XA Injury of conjunctiva and corneal abrasion without foreign body, left eye, initial encounter: Secondary | ICD-10-CM | POA: Diagnosis not present

## 2023-05-01 DIAGNOSIS — I25118 Atherosclerotic heart disease of native coronary artery with other forms of angina pectoris: Secondary | ICD-10-CM | POA: Diagnosis not present

## 2023-05-01 DIAGNOSIS — Z8673 Personal history of transient ischemic attack (TIA), and cerebral infarction without residual deficits: Secondary | ICD-10-CM | POA: Diagnosis not present

## 2023-05-01 DIAGNOSIS — R0781 Pleurodynia: Secondary | ICD-10-CM | POA: Diagnosis not present

## 2023-05-01 DIAGNOSIS — R0609 Other forms of dyspnea: Secondary | ICD-10-CM | POA: Diagnosis not present

## 2023-05-01 DIAGNOSIS — Z86711 Personal history of pulmonary embolism: Secondary | ICD-10-CM | POA: Diagnosis not present

## 2023-05-01 DIAGNOSIS — I2510 Atherosclerotic heart disease of native coronary artery without angina pectoris: Secondary | ICD-10-CM | POA: Diagnosis not present

## 2023-05-03 ENCOUNTER — Other Ambulatory Visit: Payer: Self-pay | Admitting: Internal Medicine

## 2023-05-03 DIAGNOSIS — R0609 Other forms of dyspnea: Secondary | ICD-10-CM

## 2023-05-10 ENCOUNTER — Ambulatory Visit
Admission: RE | Admit: 2023-05-10 | Discharge: 2023-05-10 | Disposition: A | Discharge status: Home / Self Care | Payer: Medicare Other | Source: Ambulatory Visit | Attending: Internal Medicine | Admitting: Internal Medicine

## 2023-05-10 DIAGNOSIS — R0609 Other forms of dyspnea: Secondary | ICD-10-CM | POA: Diagnosis not present

## 2023-05-10 MED ORDER — IOPAMIDOL (ISOVUE-370) INJECTION 76%
200.0000 mL | Freq: Once | INTRAVENOUS | Status: AC | PRN
  Administered 2023-05-10: 60 mL via INTRAVENOUS

## 2023-05-30 DIAGNOSIS — Z23 Encounter for immunization: Secondary | ICD-10-CM | POA: Diagnosis not present

## 2023-06-02 DIAGNOSIS — N390 Urinary tract infection, site not specified: Secondary | ICD-10-CM | POA: Diagnosis not present

## 2023-06-09 DIAGNOSIS — L570 Actinic keratosis: Secondary | ICD-10-CM | POA: Diagnosis not present

## 2023-06-09 DIAGNOSIS — L821 Other seborrheic keratosis: Secondary | ICD-10-CM | POA: Diagnosis not present

## 2023-06-09 DIAGNOSIS — X32XXXD Exposure to sunlight, subsequent encounter: Secondary | ICD-10-CM | POA: Diagnosis not present

## 2023-06-09 DIAGNOSIS — I781 Nevus, non-neoplastic: Secondary | ICD-10-CM | POA: Diagnosis not present

## 2023-07-25 ENCOUNTER — Ambulatory Visit: Payer: Medicare PPO | Attending: Cardiology | Admitting: Cardiology

## 2023-07-25 ENCOUNTER — Encounter: Payer: Self-pay | Admitting: Cardiology

## 2023-07-25 VITALS — BP 144/82 | HR 53 | Ht 72.0 in | Wt 190.0 lb

## 2023-07-25 DIAGNOSIS — E782 Mixed hyperlipidemia: Secondary | ICD-10-CM | POA: Diagnosis not present

## 2023-07-25 DIAGNOSIS — I2510 Atherosclerotic heart disease of native coronary artery without angina pectoris: Secondary | ICD-10-CM | POA: Diagnosis not present

## 2023-07-25 NOTE — Progress Notes (Signed)
Cardiology Office Note:  .   Date:  07/25/2023  ID:  Jay Day, DOB Apr 25, 1941, MRN [id number] PCP: Corky Crafts, MD  [facility] Providers Cardiologist:  Donato Schultz, MD    History of Present Illness: .   Jay Day is a 83 y.o. male Discussed the use of AI scribe software for clinical note transcription with the patient, who gave verbal consent to proceed.  History of Present Illness   Jay Day is an 83 year old male with coronary artery disease who presents for follow-up.  He has a history of coronary artery disease with a calcium score of 400 and no flow-limiting disease on FFR analysis on CT. He experienced chest pains that resolved about three months ago, which were alleviated by lying down. No recent chest pain. He is currently taking Eliquis 5 mg twice a day for blood clots and Crestor 5 mg on Monday, Wednesday, and Friday for cholesterol management. His last LDL cholesterol was 63, close to his goal of less than 70.  He has a history of atrial fibrillation and is on anticoagulation therapy with Eliquis to prevent thromboembolic events. He has a left ventricular apical aneurysm with thrombosis, as identified on a previous CT scan, necessitating continued anticoagulation.  He has hyperlipidemia and is managing his cholesterol with Crestor. He previously stopped taking rosuvastatin but resumed with Crestor. His LDL level is currently 63, with a goal to maintain it below 70.  He experiences joint pain, although no further details are provided about this symptom.  He follows a mostly plant-based diet, occasionally incorporating small amounts of meat. He uses a sleep app through his watch and reports sleeping well, aiming to maintain a regular schedule to improve overall system function.           Studies Reviewed: Marland Kitchen   EKG Interpretation Date/Time:  Tuesday July 25 2023 10:51:06 EST Ventricular Rate:  56 PR Interval:  206 QRS Duration:  86 QT  Interval:  416 QTC Calculation: 401 R Axis:   74  Text Interpretation: Sinus bradycardia Nonspecific ST and T wave abnormality When compared with ECG of 02-Aug-2019 10:05, No significant change since last tracing Confirmed by Donato Schultz ([id number]) on 07/25/2023 10:57:48 AM    Results   LABS Hb: 15 Cr: 1.1 LDL: 63  RADIOLOGY Calcium score: 400 FFR analysis on CT: No flow limiting disease Coronary CT scan: Mild stenosis, coronary plaque score 400, 54th percentile, LV apical aneurysm with thrombosis     Risk Assessment/Calculations:           Physical Exam:   VS:  BP (!) 144/82   Pulse (!) 53   Ht 6' (1.829 m)   Wt 190 lb (86.2 kg)   SpO2 96%   BMI 25.77 kg/m    Wt Readings from Last 3 Encounters:  07/25/23 190 lb (86.2 kg)  01/31/23 181 lb (82.1 kg)  07/28/22 183 lb 3.2 oz (83.1 kg)    GEN: Well nourished, well developed in no acute distress NECK: No JVD; No carotid bruits CARDIAC: RRR, no murmurs, no rubs, no gallops RESPIRATORY:  Clear to auscultation without rales, wheezing or rhonchi  ABDOMEN: Soft, non-tender, non-distended EXTREMITIES:  No edema; No deformity   ASSESSMENT AND PLAN: .    Assessment and Plan    Coronary Artery Disease (CAD) Calcium score of 400 with mild stenosis on coronary CT. No flow-limiting disease on FFR analysis. Occasional chest pain resolving with rest, last episode three months  ago. Current LDL is 63, goal is <55. Patient on rosuvastatin 5 mg MWF. Emphasized maintaining LDL <55 to reduce cardiovascular risk. Adheres to a mostly plant-based diet with occasional meat. - Continue rosuvastatin 5 mg MWF - Monitor chest pain - Follow-up in one year  Hyperlipidemia Current LDL is 63, goal at the least 70.  Emphasized achieving LDL goal to reduce cardiovascular events. - Continue current lipid management with Crestor. Doing well.   Atrial Fibrillation Patient on Eliquis 5 mg BID for anticoagulation. Discussed role of Eliquis in preventing  stroke and managing thromboembolic events. - Continue Eliquis 5 mg BID  Left Ventricular Apical Aneurysm with Thrombosis LV apical aneurysm with thrombosis noted on previous CT. Patient on Eliquis for anticoagulation to prevent thromboembolic events. - Continue Eliquis 5 mg BID. No stroke symptoms.   General Health Maintenance Patient follows a mostly plant-based diet with occasional meat. Reports good sleep quality and regular schedule. - Encourage continuation of plant-based diet - Maintain regular sleep schedule  Follow-up - Follow-up in one year.               Signed, Donato Schultz, MD

## 2023-07-25 NOTE — Patient Instructions (Signed)
Medication Instructions:  The current medical regimen is effective;  continue present plan and medications.  *If you need a refill on your cardiac medications before your next appointment, please call your pharmacy*  Follow-Up: At [facility], you and your health needs are our priority.  As part of our continuing mission to provide you with exceptional heart care, we have created designated Provider Care Teams.  These Care Teams include your primary Cardiologist (physician) and Advanced Practice Providers (APPs -  Physician Assistants and Nurse Practitioners) who all work together to provide you with the care you need, when you need it.  We recommend signing up for the patient portal called "MyChart".  Sign up information is provided on this After Visit Summary.  MyChart is used to connect with patients for Virtual Visits (Telemedicine).  Patients are able to view lab/test results, encounter notes, upcoming appointments, etc.  Non-urgent messages can be sent to your provider as well.   To learn more about what you can do with MyChart, go to [email or web address].    Your next appointment:   1 year(s)  Provider:   Donato Schultz, MD

## 2023-08-16 DIAGNOSIS — H401131 Primary open-angle glaucoma, bilateral, mild stage: Secondary | ICD-10-CM | POA: Diagnosis not present

## 2023-09-07 DIAGNOSIS — L570 Actinic keratosis: Secondary | ICD-10-CM | POA: Diagnosis not present

## 2023-09-07 DIAGNOSIS — X32XXXD Exposure to sunlight, subsequent encounter: Secondary | ICD-10-CM | POA: Diagnosis not present

## 2023-09-11 ENCOUNTER — Telehealth: Payer: Self-pay | Admitting: Cardiology

## 2023-09-11 ENCOUNTER — Telehealth: Payer: Self-pay | Admitting: Endocrinology

## 2023-09-11 DIAGNOSIS — D352 Benign neoplasm of pituitary gland: Secondary | ICD-10-CM

## 2023-09-11 MED ORDER — BROMOCRIPTINE MESYLATE 2.5 MG PO TABS: 1.2500 mg | ORAL_TABLET | ORAL | 3 refills | Status: DC

## 2023-09-11 NOTE — Telephone Encounter (Signed)
   Pt c/o of Chest Pain: STAT if active CP, including tightness, pressure, jaw pain, radiating pain to shoulder/upper arm/back, CP unrelieved by Nitro. Symptoms reported of SOB, nausea, vomiting, sweating.  1. Are you having CP right now? no    2. Are you experiencing any other symptoms (ex. SOB, nausea, vomiting, sweating)? no   3. Is your CP continuous or coming and going? Coming and going   4. Have you taken Nitroglycerin? No, wife states that he is not to take nitroglycerin while on a blood thinner. Advised by PCP to contact cardiology office.   5. How long have you been experiencing CP? A couple of times over the last year or two    6. If NO CP at time of call then end call with telling Pt to call back or call 911 if Chest pain returns prior to return call from triage team.    States that two weeks ago he stated that the chest discomfort felt different but it has since gone away per wife. She is scared to administer him the nitroglycerin since he is on a blood thinner. She would like some instructions on how to administer it if he starts having the discomfort again. Spoke to patient at the end of the call and he denies any other symptoms. States chest discomfort was more of a nagging feeling. They just want clarification around nitroglycerin and his blood thinner, please call patient's number [phone]

## 2023-09-11 NOTE — Telephone Encounter (Signed)
 Left voicemail to return call to office

## 2023-09-11 NOTE — Telephone Encounter (Signed)
 MEDICATION: bromocriptine bromocriptine (PARLODEL) 2.5 MG tablet  PHARMACY:    [id number] - [place], [place] - [address] AT [facility] [address] (Ph: [phone])    HAS THE PATIENT CONTACTED THEIR PHARMACY?  Yes  IS THIS A 90 DAY SUPPLY : Yes  IS PATIENT OUT OF MEDICINE:  Yes  IF NOT; HOW MUCH IS LEFT:   LAST APPOINTMENT DATE: @ 01/31/2023  NEXT APPOINTMENT DATE:@4 /07/2023  DO WE HAVE YOUR PERMISSION TO LEAVE A DETAILED MESSAGE?: Yes  OTHER COMMENTS:    **Let patient know to contact pharmacy at the end of the day to make sure medication is ready. **  ** Please notify patient to allow 48-72 hours to process**  **Encourage patient to contact the pharmacy for refills or they can request refills through [facility]

## 2023-09-11 NOTE — Telephone Encounter (Signed)
 I renewed his bromocriptine.

## 2023-09-13 ENCOUNTER — Ambulatory Visit: Admitting: Endocrinology

## 2023-09-13 ENCOUNTER — Encounter: Payer: Self-pay | Admitting: Endocrinology

## 2023-09-13 VITALS — BP 120/80 | HR 56 | Resp 20 | Ht 72.0 in | Wt 185.8 lb

## 2023-09-13 DIAGNOSIS — D352 Benign neoplasm of pituitary gland: Secondary | ICD-10-CM

## 2023-09-13 DIAGNOSIS — E221 Hyperprolactinemia: Secondary | ICD-10-CM | POA: Diagnosis not present

## 2023-09-13 NOTE — Progress Notes (Unsigned)
 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  PCP: Lorenda Ishihara, MD  HISTORY OF PRESENT ILLNESS:   Jay Day is a 83 y.o. old male with past medical history listed below, is here for follow up for pituitary concerns /prolactinoma/hyperprolactinemia.   Pertinent Pituitary History: Apparently in 2003 he was being evaluated by his PCP for complaints of decreased libido. He did not have any other symptoms except some feeling of low energy in the morning. Details of his initial evaluation are not available but apparently was found to have a high prolactin level of 99 along with a 1.4 cm right-sided pituitary tumor.  He was treated by his internist with bromocriptine.   Because of his relatively low prolactin level with 2.5 mg of bromocriptine he was tried on half tablet in 2012 and prolactin was normal at 5.2 subsequently. He had been taking 2.5 mg again for some time but his prolactin level in 11/2011 was only 2.0, in 7/14 was 1.9 and in 01/2014 his prolactin level was low at 1.6    No evidence of hypopituitarism on his evaluation in 10/2014. Testosterone level has been normal previously. Free T4 has been consistently normal.   MRIs:  Follow-up MRI in 04/2012 did not show any change in size of the tumor.  MRI brain scan done for unrelated reasons in 06/2016 showed:    T2 hyperintense focus centered within the right aspect of the pituitary gland and cavernous sinus best appreciated on the coronal sequence is decreased in size from the 2003 pituitary MRI of the brain and probably represents residual adenoma. Description of pituitary not in MRI done in 07/2019   He has been on variable doses of bromocriptine in the last few years. Patient has been treated long-term with low doses of bromocriptine    When he was not taking bromocriptine his  prolactin went up to 44 in 04/2019 and up to 27 as of 10/23.    Interval history Patient has been taking half tablet of bromocriptine 3 times a week.  Denies any new headache or peripheral vision loss.  No galactorrhea or breast tissue.  He has no complaints today.  REVIEW OF SYSTEMS:  As per history of present illness.   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   in situ-mid upper chest (txpbx)   Stroke (HCC) 2017   mild cva   Stroke [facility]) 2016    PAST SURGICAL HISTORY: Past Surgical History:  Procedure Laterality Date   COLONOSCOPY N/A 08/22/2016   Procedure: COLONOSCOPY;  Surgeon: Charolett Bumpers, MD;  Location: [facility];  Service: Endoscopy;  Laterality: N/A;   colonscopy  2007   EYE SURGERY     bilateral cataract surgery   HERNIA REPAIR  1960   HERNIA REPAIR     INGUINAL HERNIA REPAIR  08/12/2011   Procedure: LAPAROSCOPIC INGUINAL HERNIA;  Surgeon: Ernestene Mention, MD;  [facility];  Service: General;  Laterality: Left;   KNEE SURGERY  1962   left    LEFT HEART CATHETERIZATION WITH CORONARY ANGIOGRAM N/A 05/06/2014   Procedure: LEFT HEART CATHETERIZATION WITH CORONARY ANGIOGRAM;  Surgeon: Donato Schultz, MD;  Location: [facility] CATH LAB;  Service: Cardiovascular;  Laterality: N/A;   LYMPHADENECTOMY Bilateral 09/06/2019   Procedure: LYMPHADENECTOMY;  Surgeon: Sebastian Ache, MD;  Location: [facility] ORS;  Service: Urology;  Laterality: Bilateral;   ROBOT ASSISTED LAPAROSCOPIC RADICAL PROSTATECTOMY N/A 09/06/2019   Procedure: XI ROBOTIC ASSISTED LAPAROSCOPIC RADICAL PROSTATECTOMY;  Surgeon: Sebastian Ache, MD;  Location: [facility] ORS;  Service: Urology;  Laterality: N/A;  3 HRS   TONSILLECTOMY     as a child    ALLERGIES: Allergies  Allergen Reactions   Gluten Meal Other (See Comments)    Gluten Sensitivity (reaction undefined) NO BREAD   Wheat Other (See Comments)    Reaction undefined    FAMILY HISTORY:  Family History  Problem Relation Age of Onset   Pulmonary embolism Mother    Kidney failure Mother    Hypertension Mother    Kidney Stones Mother    Pulmonary embolism Father    Prostate cancer Father    Pulmonary embolism Maternal Grandmother    Pulmonary embolism Maternal Grandfather    Pulmonary embolism Paternal Grandmother    Pulmonary embolism Paternal Grandfather     SOCIAL HISTORY: Social History   Socioeconomic History   Marital status: Married    Spouse name: Not on file   Number of children: 2   Years of education: BA   Highest education level: Not on file  Occupational History   Occupation: [employer]  Tobacco Use   Smoking status: Former    Current packs/day: 0.00    Average packs/day: 0.3 packs/day for 5.0 years (1.5 ttl pk-yrs)    Types: Cigarettes    Start date: 1963    Quit date: 1968    Years since quitting: 57.2   Smokeless tobacco: Never  Vaping Use   Vaping status: Never Used  Substance and Sexual  Activity   Alcohol use: Never   Drug use: Never   Sexual activity: Not on file  Other Topics Concern   Not on file  Social History Narrative   ** Merged History Encounter **       Patient drinks about 2 cups of caffeine daily. Patient is left handed.   Social Drivers of Corporate investment banker Strain: Not on file  Food Insecurity: Not on file  Transportation Needs: Not on file  Physical Activity: Not on file  Stress: Not on file  Social Connections: Not on file    MEDICATIONS:  Current  Outpatient Medications  Medication Sig Dispense Refill   apixaban (ELIQUIS) 5 MG TABS tablet TAKE 1 TABLET(5 MG) BY MOUTH TWICE DAILY 180 tablet 1   dorzolamide-timolol (COSOPT) 22.3-6.8 MG/ML ophthalmic solution Place 1 drop into both eyes 2 (two) times daily.      latanoprost (XALATAN) 0.005 % ophthalmic solution Place 1 drop into both eyes at bedtime.     Multiple Vitamins-Minerals (MULTI FOR HIM 50+) TABS Take 1 tablet by mouth daily.     nitroGLYCERIN (NITROSTAT) 0.4 MG SL tablet Place 1 tablet under the tongue See admin instructions. As needed     rosuvastatin (CRESTOR) 5 MG tablet Take 5 mg by mouth 3 (three) times a week.     Saccharomyces boulardii (PROBIOTIC) 250 MG CAPS Take 250 mg by mouth daily.     Turmeric (QC TUMERIC COMPLEX PO) Take by mouth.     [START ON 09/15/2023] bromocriptine (PARLODEL) 2.5 MG tablet Take 0.5 tablets (1.25 mg total) by mouth 3 (three) times a week. 20 tablet 3   No current facility-administered medications for this visit.    PHYSICAL EXAM: Vitals:   09/13/23 1443  BP: 120/80  Pulse: (!) 56  Resp: 20  SpO2: 98%  Weight: 185 lb 12.8 oz (84.3 kg)  Height: 6' (1.829 m)   Body mass index is 25.2 kg/m.  Wt Readings from Last 3 Encounters:  09/13/23 185 lb 12.8 oz (84.3 kg)  07/25/23 190 lb (86.2 kg)  01/31/23 181 lb (82.1 kg)    General: Well developed, well nourished male in no apparent distress. No cushingoid and acromegalic appearance HEENT:  AT/[place], no external lesions. Hearing intact to the spoken word Eyes: EOMI. Conjunctiva clear and no icterus. Visual Field by confrontation grossly intact Neck: Trachea midline, neck supple without appreciable thyromegaly or lymphadenopathy and no palpable thyroid nodules Lungs: Clear to auscultation, no wheeze. Respirations not labored Heart: S1S2, Regular in rate and rhythm.  Abdomen: Soft, non tender, non distended Neurologic: Alert, oriented, normal speech, deep tendon biceps reflexes normal,  no gross focal neurological deficit Extremities: No pedal pitting edema, no tremors of outstretched hands Skin: Warm, color good.  Psychiatric: Does not appear depressed or anxious  PERTINENT HISTORIC LABORATORY AND IMAGING STUDIES:  All pertinent laboratory results were reviewed. Please see HPI also for further details.   ASSESSMENT / PLAN  1. Prolactin secreting pituitary adenoma (HCC)   2. Hyperprolactinemia (HCC)     Patient has longstanding history of prolactinoma 1.4 cm at baseline in 2003 and initial prolactin level was high at 99.  Initial symptoms were mainly related to decreased libido.  He has been on bromocriptine 1.25 mg, taking at bedtime on Mondays/Wednesdays and Fridays. With this his prolactin is usually well-controlled and he has no current symptoms   With previous trials of stopping his bromocriptine his prolactin has gone up to as high as 43.6.    Last MRI of brain in 2021 did not show any obvious pituitary enlargement.   Since  he had a macroadenoma of the pituitary he will continue low-dose bromocriptine long-term.  Plan: -Continue bromocriptine 1.25 mg 3 times a week. -Check prolactin level today. - Follow-up in 6 months.  Will ultimately turn into annual visit.   Prolactin level normal today.    Latest Reference Range & Units 09/13/23 15:19  Prolactin 2.0 - 18.0 ng/mL 11.0     Diagnoses and all orders for this visit:  Prolactin secreting pituitary adenoma  (HCC) -     Prolactin -  bromocriptine (PARLODEL) 2.5 MG tablet; Take 0.5 tablets (1.25 mg total) by mouth 3 (three) times a week.  Hyperprolactinemia (HCC)    DISPOSITION Follow up in clinic in 6 months suggested.  All questions answered and patient verbalized understanding of the plan.  Jay Jayvon Mounger, MD [facility] [address] [place], [address] [place], [place] [address] Phone # [phone]  At least part of this note was generated using voice recognition software. Inadvertent word errors may have occurred, which were not recognized during the proofreading process.

## 2023-09-14 ENCOUNTER — Encounter: Payer: Self-pay | Admitting: Endocrinology

## 2023-09-14 LAB — PROLACTIN: Prolactin: 11 ng/mL (ref 2.0–18.0)

## 2023-09-14 MED ORDER — BROMOCRIPTINE MESYLATE 2.5 MG PO TABS: 1.2500 mg | ORAL_TABLET | ORAL | 3 refills | Status: AC

## 2023-09-22 NOTE — Telephone Encounter (Signed)
 Pt reports his wife read an article that said pt's shouldn't take Eliquis and SL NTG together.  Advised pt, if he needs NTG to treat chest pain, to not delay it's use. Nitroglycerin may cause a drop in BP and he is aware to lay down while using.  He is currently not having any pain, they were just wanting clarification.

## 2023-10-17 ENCOUNTER — Emergency Department (HOSPITAL_COMMUNITY)
Admission: EM | Admit: 2023-10-17 | Discharge: 2023-10-18 | Discharge status: Against Medical Advice | Attending: Emergency Medicine | Admitting: Emergency Medicine

## 2023-10-17 ENCOUNTER — Other Ambulatory Visit: Payer: Self-pay

## 2023-10-17 ENCOUNTER — Emergency Department (HOSPITAL_COMMUNITY)

## 2023-10-17 ENCOUNTER — Encounter (HOSPITAL_COMMUNITY): Payer: Self-pay | Admitting: Emergency Medicine

## 2023-10-17 DIAGNOSIS — R519 Headache, unspecified: Secondary | ICD-10-CM | POA: Diagnosis not present

## 2023-10-17 DIAGNOSIS — G9389 Other specified disorders of brain: Secondary | ICD-10-CM | POA: Diagnosis not present

## 2023-10-17 DIAGNOSIS — Z8673 Personal history of transient ischemic attack (TIA), and cerebral infarction without residual deficits: Secondary | ICD-10-CM | POA: Diagnosis not present

## 2023-10-17 DIAGNOSIS — Z5321 Procedure and treatment not carried out due to patient leaving prior to being seen by health care provider: Secondary | ICD-10-CM | POA: Insufficient documentation

## 2023-10-17 DIAGNOSIS — R42 Dizziness and giddiness: Secondary | ICD-10-CM | POA: Diagnosis not present

## 2023-10-17 DIAGNOSIS — Y900 Blood alcohol level of less than 20 mg/100 ml: Secondary | ICD-10-CM | POA: Diagnosis not present

## 2023-10-17 DIAGNOSIS — I6523 Occlusion and stenosis of bilateral carotid arteries: Secondary | ICD-10-CM | POA: Diagnosis not present

## 2023-10-17 DIAGNOSIS — Z7901 Long term (current) use of anticoagulants: Secondary | ICD-10-CM | POA: Diagnosis not present

## 2023-10-17 LAB — CBC
HCT: 42.4 % (ref 39.0–52.0)
Hemoglobin: 14.8 g/dL (ref 13.0–17.0)
MCH: 35.6 pg — ABNORMAL HIGH (ref 26.0–34.0)
MCHC: 34.9 g/dL (ref 30.0–36.0)
MCV: 101.9 fL — ABNORMAL HIGH (ref 80.0–100.0)
Platelets: 137 10*3/uL — ABNORMAL LOW (ref 150–400)
RBC: 4.16 MIL/uL — ABNORMAL LOW (ref 4.22–5.81)
RDW: 11.9 % (ref 11.5–15.5)
WBC: 6 10*3/uL (ref 4.0–10.5)
nRBC: 0 % (ref 0.0–0.2)

## 2023-10-17 LAB — DIFFERENTIAL
Abs Immature Granulocytes: 0.02 10*3/uL (ref 0.00–0.07)
Basophils Absolute: 0.1 10*3/uL (ref 0.0–0.1)
Basophils Relative: 1 %
Eosinophils Absolute: 0.5 10*3/uL (ref 0.0–0.5)
Eosinophils Relative: 9 %
Immature Granulocytes: 0 %
Lymphocytes Relative: 23 %
Lymphs Abs: 1.4 10*3/uL (ref 0.7–4.0)
Monocytes Absolute: 0.7 10*3/uL (ref 0.1–1.0)
Monocytes Relative: 11 %
Neutro Abs: 3.4 10*3/uL (ref 1.7–7.7)
Neutrophils Relative %: 56 %

## 2023-10-17 LAB — I-STAT CHEM 8, ED
BUN: 24 mg/dL — ABNORMAL HIGH (ref 8–23)
Calcium, Ion: 1.18 mmol/L (ref 1.15–1.40)
Chloride: 106 mmol/L (ref 98–111)
Creatinine, Ser: 1.3 mg/dL — ABNORMAL HIGH (ref 0.61–1.24)
Glucose, Bld: 119 mg/dL — ABNORMAL HIGH (ref 70–99)
HCT: 42 % (ref 39.0–52.0)
Hemoglobin: 14.3 g/dL (ref 13.0–17.0)
Potassium: 3.8 mmol/L (ref 3.5–5.1)
Sodium: 142 mmol/L (ref 135–145)
TCO2: 22 mmol/L (ref 22–32)

## 2023-10-17 LAB — PROTIME-INR
INR: 1.2 (ref 0.8–1.2)
Prothrombin Time: 15.6 s — ABNORMAL HIGH (ref 11.4–15.2)

## 2023-10-17 LAB — COMPREHENSIVE METABOLIC PANEL WITH GFR
ALT: 18 U/L (ref 0–44)
AST: 33 U/L (ref 15–41)
Albumin: 3.7 g/dL (ref 3.5–5.0)
Alkaline Phosphatase: 62 U/L (ref 38–126)
Anion gap: 11 (ref 5–15)
BUN: 24 mg/dL — ABNORMAL HIGH (ref 8–23)
CO2: 24 mmol/L (ref 22–32)
Calcium: 9.4 mg/dL (ref 8.9–10.3)
Chloride: 105 mmol/L (ref 98–111)
Creatinine, Ser: 1.24 mg/dL (ref 0.61–1.24)
GFR, Estimated: 58 mL/min — ABNORMAL LOW (ref >60)
Glucose, Bld: 120 mg/dL — ABNORMAL HIGH (ref 70–99)
Potassium: 3.7 mmol/L (ref 3.5–5.1)
Sodium: 140 mmol/L (ref 135–145)
Total Bilirubin: 0.8 mg/dL (ref 0.0–1.2)
Total Protein: 6.6 g/dL (ref 6.5–8.1)

## 2023-10-17 LAB — APTT: aPTT: 31 s (ref 24–36)

## 2023-10-17 LAB — ETHANOL: Alcohol, Ethyl (B): <15 mg/dL (ref <15)

## 2023-10-17 MED ORDER — SODIUM CHLORIDE 0.9% FLUSH: 3.0000 mL | Freq: Once | INTRAVENOUS | Status: DC

## 2023-10-17 NOTE — ED Triage Notes (Signed)
 Pt ambulatory to triage with reports of lightheadedness and headache (pressure) upon waking up. Pt went to bed around 1130. States he feels off balance. Pt was having angina for a few weeks that has resolved.  Pt takes eliquis , hx of previous CVA. Wife reports the balance has been off for 2-3 days.

## 2023-10-18 NOTE — ED Notes (Signed)
 Pt left d/t wait time, let sort tech know before departure.

## 2023-11-08 DIAGNOSIS — Z7901 Long term (current) use of anticoagulants: Secondary | ICD-10-CM | POA: Diagnosis not present

## 2023-11-08 DIAGNOSIS — N393 Stress incontinence (female) (male): Secondary | ICD-10-CM | POA: Diagnosis not present

## 2023-11-08 DIAGNOSIS — E221 Hyperprolactinemia: Secondary | ICD-10-CM | POA: Diagnosis not present

## 2023-11-08 DIAGNOSIS — E785 Hyperlipidemia, unspecified: Secondary | ICD-10-CM | POA: Diagnosis not present

## 2023-11-08 DIAGNOSIS — I10 Essential (primary) hypertension: Secondary | ICD-10-CM | POA: Diagnosis not present

## 2023-11-08 DIAGNOSIS — I252 Old myocardial infarction: Secondary | ICD-10-CM | POA: Diagnosis not present

## 2023-11-08 DIAGNOSIS — D6859 Other primary thrombophilia: Secondary | ICD-10-CM | POA: Diagnosis not present

## 2023-11-08 DIAGNOSIS — G20A1 Parkinson's disease without dyskinesia, without mention of fluctuations: Secondary | ICD-10-CM | POA: Diagnosis not present

## 2023-11-08 DIAGNOSIS — H409 Unspecified glaucoma: Secondary | ICD-10-CM | POA: Diagnosis not present

## 2023-11-16 DIAGNOSIS — B078 Other viral warts: Secondary | ICD-10-CM | POA: Diagnosis not present

## 2023-11-16 DIAGNOSIS — L820 Inflamed seborrheic keratosis: Secondary | ICD-10-CM | POA: Diagnosis not present

## 2023-11-16 DIAGNOSIS — L304 Erythema intertrigo: Secondary | ICD-10-CM | POA: Diagnosis not present

## 2023-11-16 DIAGNOSIS — L570 Actinic keratosis: Secondary | ICD-10-CM | POA: Diagnosis not present

## 2023-11-16 DIAGNOSIS — X32XXXD Exposure to sunlight, subsequent encounter: Secondary | ICD-10-CM | POA: Diagnosis not present

## 2023-11-16 DIAGNOSIS — L821 Other seborrheic keratosis: Secondary | ICD-10-CM | POA: Diagnosis not present

## 2023-11-29 ENCOUNTER — Telehealth: Payer: Self-pay | Admitting: Cardiology

## 2023-11-29 NOTE — Telephone Encounter (Signed)
 Patient reports he is having intermittent chest pain for the past 2 weeks. He denies any SOB, no radiating pain, no N/V.  He describes the chest pain as tightness. He notes he does not have any CP or SOB when he is swimming, but chest tightness comes on much later after swimming. He reports these episodes occur in the afternoons, and resolve after he lies down and rests.  Denies increased stress as contributing factor. Reports he follows healthy plant-based diet, stays active and is sleeping well.  Chest pain similar to what he discussed at last office visit in February.  Advised on ED precautions for active chest pain. Patient verbalized understanding.  Will forward to Dr. Renna Cary to review and advise.

## 2023-11-29 NOTE — Telephone Encounter (Signed)
 Pt c/o of Chest Pain: STAT if active (IN THIS MOMENT) CP, including tightness, pressure, jaw pain, shoulder/upper arm/back pain, SOB, nausea, and vomiting.  1. Are you having CP right now (tightness, pressure, or discomfort)? Yes a little   2. Are you experiencing any other symptoms (ex. SOB, nausea, vomiting, sweating)? No   3. How long have you been experiencing CP? A couple of weeks   4. Is your CP continuous or coming and going? Coming and going   5. Have you taken Nitroglycerin ? No  6. If CP returns before callback, please consider calling 911. ?

## 2023-12-01 NOTE — Telephone Encounter (Signed)
 Left message for patient to callback.  Called to discuss Dr. Renna Cary' response/recommendation: Hugh Madura, MD  12/01/23  4:54 PM I would be fine with him trying isosorbide 30 mg a day, a long-acting nitrate to help him with chest discomfort.  Sometimes headache can be a side effect of this medication but after taking it for a few days the headache can go away.  Lets see if this helps. Dorothye Gathers, MD

## 2023-12-11 ENCOUNTER — Encounter: Payer: Self-pay | Admitting: Cardiology

## 2023-12-11 ENCOUNTER — Ambulatory Visit: Attending: Cardiology | Admitting: Cardiology

## 2023-12-11 VITALS — BP 120/70 | HR 55 | Resp 16 | Ht 72.0 in | Wt 180.6 lb

## 2023-12-11 DIAGNOSIS — I2510 Atherosclerotic heart disease of native coronary artery without angina pectoris: Secondary | ICD-10-CM | POA: Diagnosis not present

## 2023-12-11 NOTE — H&P (View-Only) (Signed)
 Cardiology Office Note:  .   Date:  12/11/2023  ID:  Jay Day, DOB 1940-07-17, MRN [id number] PCP: Jay Charm, MD  [facility] Providers Cardiologist:  [name], MD     History of Present Illness: .   Jay Day is a 83 y.o. male Discussed the use of AI scribe software for clinical note transcription with the patient, who gave verbal consent to proceed.  History of Present Illness Jay Day is an 83 year old male with coronary artery disease who presents with intermittent chest pain.  He has been experiencing episodes of intermittent chest pain over the past two weeks. The pain is not associated with shortness of breath, radiation, nausea, or vomiting. It typically occurs a couple of hours after exercising, such as swimming, which he does four times a week for half a mile. He does not experience chest pain during exercise.  He has a known history of coronary artery disease, with a prior coronary CT scan showing no significant restriction in blood flow despite plaque presence. A coronary calcium  score in 2024 was 419, placing him in the 51st percentile, primarily affecting the LAD. The coronary CT showed stenosis in the proximal LAD, categorized as up to 49%.  His past medical history includes atrial fibrillation for which he is on Eliquis . He also has a left ventricular apical aneurysm with thrombosis, necessitating continued use of Eliquis . He has been on and off statins, specifically rosuvastatin  5 mg taken on Monday, Wednesday, and Friday. His prior LDL was 63, which is at goal. He has a history of two pulmonary embolisms in 2012, with a negative hematology workup for genetic causes, leading to a recommendation for lifelong anticoagulation.  His blood pressure was 120/70 this morning. He mentions a history of taking blood pressure medication for about 40 years, but his blood pressure has normalized, which he attributes to lifestyle changes including diet  and exercise. He follows a plant-based diet, eats very little meat, and consumes a lot of salads.      ROS: No syncope  Studies Reviewed: SABRA   EKG Interpretation Date/Time:  Monday December 11 2023 08:40:07 EDT Ventricular Rate:  54 PR Interval:  210 QRS Duration:  90 QT Interval:  422 QTC Calculation: 400 R Axis:   28  Text Interpretation: Sinus bradycardia with 1st degree A-V block Nonspecific ST and T wave abnormality When compared with ECG of 17-Oct-2023 16:26, Premature atrial complexes are no longer Present Confirmed by Parchment Oneil ([id number]) on 12/11/2023 1:01:40 PM    Results LABS Cr: 1.3 (10/17/2023) LDL: 63 Hb: 14.3  RADIOLOGY Coronary CT: No significant restriction in blood flow despite plaque (04/19/2021) Coronary Calcium  Score: 419, 51st percentile, mostly in LAD (2024) Coronary CT: Stenosis in proximal LAD up to 49% (2024)  DIAGNOSTIC EKG: Sinus bradycardia, first degree AV block, PR interval 210 ms at 54 bpm (12/11/2023) Risk Assessment/Calculations:            Physical Exam:   VS:  BP 120/70 (BP Location: Right Arm, Patient Position: Sitting, Cuff Size: Normal)   Pulse (!) 55   Resp 16   Ht 6' (1.829 m)   Wt 180 lb 9.6 oz (81.9 kg)   SpO2 98%   BMI 24.49 kg/m    Wt Readings from Last 3 Encounters:  12/11/23 180 lb 9.6 oz (81.9 kg)  10/17/23 180 lb (81.6 kg)  09/13/23 185 lb 12.8 oz (84.3 kg)    GEN: Well nourished, well developed in  no acute distress NECK: No JVD; No carotid bruits CARDIAC: RRR, no murmurs, no rubs, no gallops RESPIRATORY:  Clear to auscultation without rales, wheezing or rhonchi  ABDOMEN: Soft, non-tender, non-distended EXTREMITIES:  No edema; No deformity   ASSESSMENT AND PLAN: .    Assessment and Plan Assessment & Plan Coronary Artery Disease Intermittent chest pain over the past two weeks without shortness of breath, radiation, nausea, or vomiting. Known coronary artery disease from prior coronary CT scan with flow analysis  showing no significant restriction in blood flow despite plaque. Prior coronary CT on April 19, 2021, showed stenosis in the proximal LAD up to 49%. Coronary calcium  score in 2024 was 419, 51st percentile, mostly in the LAD. EKG today shows sinus bradycardia, first degree AV block, 210 milliseconds at 54 beats per minute. Differential diagnosis includes musculoskeletal pain and GI symptoms. - Discuss heart catheterization to evaluate coronary arteries - Hold Eliquis  for two days prior to procedure - Ensure adequate hydration before procedure - Perform cardiac catheterization, left heart cath with possible PCI  Atrial Fibrillation Atrial fibrillation managed with Eliquis . EKG shows sinus bradycardia and first degree AV block, but no acute issues related to atrial fibrillation. 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 Recommended lifelong anticoagulation. - Continue Eliquis  5 mg BID  Hyperlipidemia Prior LDL was 63, at goal of less than 70. On rosuvastatin  (Crestor ) 5 mg Monday, Wednesday, Friday. - Continue Crestor  5 mg MWF       Informed Consent   Shared Decision Making/Informed Consent6  The risks [stroke (1 in 1000), death (1 in 1000), kidney failure [usually temporary] (1 in 500), bleeding (1 in 200), allergic reaction [possibly serious] (1 in 200)], benefits (diagnostic support and management of coronary artery disease) and alternatives of a cardiac catheterization were discussed in detail with [name] and he is willing to proceed.     Dispo: 6 mth APP  Signed, [name], MD

## 2023-12-11 NOTE — Progress Notes (Signed)
 Cardiology Office Note:  .   Date:  12/11/2023  ID:  Jay Day, DOB [birth year], MRN [id number] PCP: Elliot Charm, MD  [facility] Providers Cardiologist:  [name], MD     History of Present Illness: .   Jay Day is a 83 y.o. male Discussed the use of AI scribe software for clinical note transcription with the patient, who gave verbal consent to proceed.  History of Present Illness Jay Day is an 83 year old male with coronary artery disease who presents with intermittent chest pain.  He has been experiencing episodes of intermittent chest pain over the past two weeks. The pain is not associated with shortness of breath, radiation, nausea, or vomiting. It typically occurs a couple of hours after exercising, such as swimming, which he does four times a week for half a mile. He does not experience chest pain during exercise.  He has a known history of coronary artery disease, with a prior coronary CT scan showing no significant restriction in blood flow despite plaque presence. A coronary calcium  score in 2024 was 419, placing him in the 51st percentile, primarily affecting the LAD. The coronary CT showed stenosis in the proximal LAD, categorized as up to 49%.  His past medical history includes atrial fibrillation for which he is on Eliquis . He also has a left ventricular apical aneurysm with thrombosis, necessitating continued use of Eliquis . He has been on and off statins, specifically rosuvastatin  5 mg taken on Monday, Wednesday, and Friday. His prior LDL was 63, which is at goal. He has a history of two pulmonary embolisms in 2012, with a negative hematology workup for genetic causes, leading to a recommendation for lifelong anticoagulation.  His blood pressure was 120/70 this morning. He mentions a history of taking blood pressure medication for about 40 years, but his blood pressure has normalized, which he attributes to lifestyle changes including diet  and exercise. He follows a plant-based diet, eats very little meat, and consumes a lot of salads.      ROS: No syncope  Studies Reviewed: SABRA   EKG Interpretation Date/Time:  Monday December 11 2023 08:40:07 EDT Ventricular Rate:  54 PR Interval:  210 QRS Duration:  90 QT Interval:  422 QTC Calculation: 400 R Axis:   28  Text Interpretation: Sinus bradycardia with 1st degree A-V block Nonspecific ST and T wave abnormality When compared with ECG of 17-Oct-2023 16:26, Premature atrial complexes are no longer Present Confirmed by Parchment Oneil ([id number]) on 12/11/2023 1:01:40 PM    Results LABS Cr: 1.3 (10/17/2023) LDL: 63 Hb: 14.3  RADIOLOGY Coronary CT: No significant restriction in blood flow despite plaque (04/19/2021) Coronary Calcium  Score: 419, 51st percentile, mostly in LAD (2024) Coronary CT: Stenosis in proximal LAD up to 49% (2024)  DIAGNOSTIC EKG: Sinus bradycardia, first degree AV block, PR interval 210 ms at 54 bpm (12/11/2023) Risk Assessment/Calculations:            Physical Exam:   VS:  BP 120/70 (BP Location: Right Arm, Patient Position: Sitting, Cuff Size: Normal)   Pulse (!) 55   Resp 16   Ht 6' (1.829 m)   Wt 180 lb 9.6 oz (81.9 kg)   SpO2 98%   BMI 24.49 kg/m    Wt Readings from Last 3 Encounters:  12/11/23 180 lb 9.6 oz (81.9 kg)  10/17/23 180 lb (81.6 kg)  09/13/23 185 lb 12.8 oz (84.3 kg)    GEN: Well nourished, well developed in  no acute distress NECK: No JVD; No carotid bruits CARDIAC: RRR, no murmurs, no rubs, no gallops RESPIRATORY:  Clear to auscultation without rales, wheezing or rhonchi  ABDOMEN: Soft, non-tender, non-distended EXTREMITIES:  No edema; No deformity   ASSESSMENT AND PLAN: .    Assessment and Plan Assessment & Plan Coronary Artery Disease Intermittent chest pain over the past two weeks without shortness of breath, radiation, nausea, or vomiting. Known coronary artery disease from prior coronary CT scan with flow analysis  showing no significant restriction in blood flow despite plaque. Prior coronary CT on April 19, 2021, showed stenosis in the proximal LAD up to 49%. Coronary calcium  score in 2024 was 419, 51st percentile, mostly in the LAD. EKG today shows sinus bradycardia, first degree AV block, 210 milliseconds at 54 beats per minute. Differential diagnosis includes musculoskeletal pain and GI symptoms. - Discuss heart catheterization to evaluate coronary arteries - Hold Eliquis  for two days prior to procedure - Ensure adequate hydration before procedure - Perform cardiac catheterization, left heart cath with possible PCI  Atrial Fibrillation Atrial fibrillation managed with Eliquis . EKG shows sinus bradycardia and first degree AV block, but no acute issues related to atrial fibrillation. 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 Recommended lifelong anticoagulation. - Continue Eliquis  5 mg BID  Hyperlipidemia Prior LDL was 63, at goal of less than 70. On rosuvastatin  (Crestor ) 5 mg Monday, Wednesday, Friday. - Continue Crestor  5 mg MWF       Informed Consent   Shared Decision Making/Informed Consent6  The risks [stroke (1 in 1000), death (1 in 1000), kidney failure [usually temporary] (1 in 500), bleeding (1 in 200), allergic reaction [possibly serious] (1 in 200)], benefits (diagnostic support and management of coronary artery disease) and alternatives of a cardiac catheterization were discussed in detail with Jay Day and he is willing to proceed.     Dispo: 6 mth APP  Signed, [name], MD

## 2023-12-11 NOTE — Patient Instructions (Addendum)
 Medication Instructions:  The current medical regimen is effective;  continue present plan and medications.  *If you need a refill on your cardiac medications before your next appointment, please call your pharmacy*  Lab Work: TODAY AT [facility] on the 1st Floor: CBC, BMP  If you have labs (blood work) drawn today and your tests are completely normal, you will receive your results only by: MyChart Message (if you have MyChart) OR A paper copy in the mail If you have any lab test that is abnormal or we need to change your treatment, we will call you to review the results.  Testing/Procedures: Your physician has requested that you have a cardiac catheterization. Cardiac catheterization is used to diagnose and/or treat various heart conditions. Doctors may recommend this procedure for a number of different reasons. The most common reason is to evaluate chest pain. Chest pain can be a symptom of coronary artery disease (CAD), and cardiac catheterization can show whether plaque is narrowing or blocking your heart's arteries. This procedure is also used to evaluate the valves, as well as measure the blood flow and oxygen levels in different parts of your heart. For further information please visit [email or web address] Please follow instruction sheet, as given.    Follow-Up: At [facility], you and your health needs are our priority.  As part of our continuing mission to provide you with exceptional heart care, our providers are all part of one team.  This team includes your primary Cardiologist (physician) and Advanced Practice Providers or APPs (Physician Assistants and Nurse Practitioners) who all work together to provide you with the care you need, when you need it.  Your next appointment:   6 month(s)  Provider:   One of our Advanced Practice Providers (APPs): Morse Clause, PA-C  Lamarr Satterfield, NP Miriam Shams, NP  Olivia Pavy, PA-C Josefa Beauvais, NP  Leontine Salen,  PA-C Orren Fabry, PA-C  [place], PA-C Ernest Dick, NP  Damien Braver, NP Jon Hails, PA-C  Waddell Donath, PA-C    Dayna Dunn, PA-C  Damaree Weaver, PA-C Lum Louis, NP Katlyn West, NP Callie Goodrich, PA-C  Evan Williams, PA-C Sheng Haley, PA-C  Xika Zhao, NP Kathleen Johnson, PA-C   Then, [name], MD will plan to see you again in 1 year(s).    We recommend signing up for the patient portal called MyChart.  Sign up information is provided on this After Visit Summary.  MyChart is used to connect with patients for Virtual Visits (Telemedicine).  Patients are able to view lab/test results, encounter notes, upcoming appointments, etc.  Non-urgent messages can be sent to your provider as well.   To learn more about what you can do with MyChart, go to [email or web address].   OTHER INSTRUCTIONS   You are scheduled for a Cardiac Catheterization on Thursday, July 3 with Dr. Gordy Bergamo.  1. Please arrive at the [facility] (Main Entrance A) at [facility]: [address] [place], [place] [address] at 5:30 AM (This time is 5 hour(s) before your procedure to ensure your preparation).   You will receive IV (intravenous) hydration  prior to heart cath.   Free valet parking service is available. You will check in at ADMITTING. The support person will be asked to wait in the waiting room.  It is OK to have someone drop you off and come back when you are ready to be discharged.    Special note: Every effort is made to have your procedure done on time.  Please understand that emergencies sometimes delay scheduled procedures.  2. Diet: Do not eat solid foods after midnight.  The patient may have clear liquids until 5am upon the day of the procedure.  3. Labs: You will need to have blood drawn TODAY.   4. Medication instructions in preparation for your procedure:   Contrast Allergy : No   Stop taking Eliquis  (Apixiban) on Tuesday, July 1.   On the morning of your procedure, take  your Aspirin  81 mg and any morning medicines NOT listed above.  You may use sips of water .  5. Plan to go home the same day, you will only stay overnight if medically necessary. 6. Bring a current list of your medications and current insurance cards. 7. You MUST have a responsible person to drive you home. 8. Someone MUST be with you the first 24 hours after you arrive home or your discharge will be delayed. 9. Please wear clothes that are easy to get on and off and wear slip-on shoes.  Thank you for allowing us  to care for you!   -- [facility]

## 2023-12-12 ENCOUNTER — Ambulatory Visit: Payer: Self-pay | Admitting: Cardiology

## 2023-12-12 ENCOUNTER — Telehealth: Payer: Self-pay | Admitting: *Deleted

## 2023-12-12 LAB — CBC
Hematocrit: 43 % (ref 37.5–51.0)
Hemoglobin: 15.3 g/dL (ref 13.0–17.7)
MCH: 38.1 pg — ABNORMAL HIGH (ref 26.6–33.0)
MCHC: 35.6 g/dL (ref 31.5–35.7)
MCV: 107 fL — ABNORMAL HIGH (ref 79–97)
Platelets: 135 10*3/uL — ABNORMAL LOW (ref 150–450)
RBC: 4.02 x10E6/uL — ABNORMAL LOW (ref 4.14–5.80)
RDW: 11.9 % (ref 11.6–15.4)
WBC: 5.5 10*3/uL (ref 3.4–10.8)

## 2023-12-12 LAB — BASIC METABOLIC PANEL WITH GFR
BUN/Creatinine Ratio: 20 (ref 10–24)
BUN: 23 mg/dL (ref 8–27)
CO2: 20 mmol/L (ref 20–29)
Calcium: 9.4 mg/dL (ref 8.6–10.2)
Chloride: 107 mmol/L — AB (ref 96–106)
Creatinine, Ser: 1.17 mg/dL (ref 0.76–1.27)
Glucose: 80 mg/dL (ref 70–99)
Potassium: 4 mmol/L (ref 3.5–5.2)
Sodium: 144 mmol/L (ref 134–144)
eGFR: 63 mL/min/{1.73_m2} (ref >59)

## 2023-12-12 NOTE — Telephone Encounter (Signed)
 Cardiac Catheterization scheduled at [facility] for: Thursday December 14, 2023 10:30 AM Arrival time [facility] Main Entrance A at: 8:30 AM-eGFR 63-does not need pre-procedure hydration per Dr Jeffrie. Patient advised to stay well hydrated prior to procedure.  Nothing to eat after midnight prior to procedure, clear liquids until 5 AM day of procedure.  Medication instructions: -Hold:  Eliquis  -none 12/12/23 until post procedure -Other usual morning medications can be taken with sips of water  including aspirin  81 mg.  Plan to go home the same day, you will only stay overnight if medically necessary.  You must have responsible adult to drive you home.  Someone must be with you the first 24 hours after you arrive home.  Reviewed procedure instructions with patient, pt tells me he will stay well hydrated prior to procedure.

## 2023-12-14 ENCOUNTER — Encounter (HOSPITAL_COMMUNITY)
Admission: RE | Disposition: A | Discharge status: Home / Self Care | Payer: Self-pay | Source: Home / Self Care | Attending: Cardiology

## 2023-12-14 ENCOUNTER — Ambulatory Visit (HOSPITAL_COMMUNITY)
Admission: RE | Admit: 2023-12-14 | Discharge: 2023-12-14 | Disposition: A | Discharge status: Home / Self Care | Attending: Cardiology | Admitting: Cardiology

## 2023-12-14 ENCOUNTER — Other Ambulatory Visit: Payer: Self-pay

## 2023-12-14 DIAGNOSIS — R079 Chest pain, unspecified: Secondary | ICD-10-CM | POA: Diagnosis not present

## 2023-12-14 DIAGNOSIS — Z7901 Long term (current) use of anticoagulants: Secondary | ICD-10-CM | POA: Insufficient documentation

## 2023-12-14 DIAGNOSIS — Z79899 Other long term (current) drug therapy: Secondary | ICD-10-CM | POA: Insufficient documentation

## 2023-12-14 DIAGNOSIS — I2584 Coronary atherosclerosis due to calcified coronary lesion: Secondary | ICD-10-CM | POA: Diagnosis not present

## 2023-12-14 DIAGNOSIS — D6859 Other primary thrombophilia: Secondary | ICD-10-CM | POA: Diagnosis not present

## 2023-12-14 DIAGNOSIS — I253 Aneurysm of heart: Secondary | ICD-10-CM | POA: Diagnosis not present

## 2023-12-14 DIAGNOSIS — I4891 Unspecified atrial fibrillation: Secondary | ICD-10-CM | POA: Insufficient documentation

## 2023-12-14 DIAGNOSIS — Z86711 Personal history of pulmonary embolism: Secondary | ICD-10-CM | POA: Diagnosis not present

## 2023-12-14 DIAGNOSIS — I2510 Atherosclerotic heart disease of native coronary artery without angina pectoris: Secondary | ICD-10-CM

## 2023-12-14 DIAGNOSIS — I10 Essential (primary) hypertension: Secondary | ICD-10-CM | POA: Insufficient documentation

## 2023-12-14 DIAGNOSIS — E785 Hyperlipidemia, unspecified: Secondary | ICD-10-CM | POA: Diagnosis not present

## 2023-12-14 HISTORY — PX: LEFT HEART CATH AND CORONARY ANGIOGRAPHY: CATH118249

## 2023-12-14 SURGERY — LEFT HEART CATH AND CORONARY ANGIOGRAPHY
Anesthesia: LOCAL

## 2023-12-14 MED ORDER — APIXABAN 5 MG PO TABS: ORAL_TABLET | ORAL | Status: AC

## 2023-12-14 MED ORDER — VERAPAMIL HCL 2.5 MG/ML IV SOLN
INTRAVENOUS | Status: AC
  Filled 2023-12-14: qty 2

## 2023-12-14 MED ORDER — HEPARIN SODIUM (PORCINE) 1000 UNIT/ML IJ SOLN
INTRAMUSCULAR | Status: AC
  Filled 2023-12-14: qty 10

## 2023-12-14 MED ORDER — ASPIRIN 81 MG PO CHEW: 81.0000 mg | CHEWABLE_TABLET | ORAL | Status: DC

## 2023-12-14 MED ORDER — HEPARIN (PORCINE) IN NACL 1000-0.9 UT/500ML-% IV SOLN
INTRAVENOUS | Status: DC | PRN
  Administered 2023-12-14: 1000 mL via SURGICAL_CAVITY

## 2023-12-14 MED ORDER — IOHEXOL 350 MG/ML SOLN
INTRAVENOUS | Status: DC | PRN
  Administered 2023-12-14: 65 mL

## 2023-12-14 MED ORDER — SODIUM CHLORIDE 0.9 % WEIGHT BASED INFUSION: 3.0000 mL/kg/h | INTRAVENOUS | Status: AC

## 2023-12-14 MED ORDER — LIDOCAINE HCL (PF) 1 % IJ SOLN
INTRAMUSCULAR | Status: AC
  Filled 2023-12-14: qty 30

## 2023-12-14 MED ORDER — MIDAZOLAM HCL 2 MG/2ML IJ SOLN
INTRAMUSCULAR | Status: AC
  Filled 2023-12-14: qty 2

## 2023-12-14 MED ORDER — FENTANYL CITRATE (PF) 100 MCG/2ML IJ SOLN
INTRAMUSCULAR | Status: DC | PRN
  Administered 2023-12-14: 25 ug via INTRAVENOUS

## 2023-12-14 MED ORDER — LIDOCAINE HCL (PF) 1 % IJ SOLN
INTRAMUSCULAR | Status: DC | PRN
  Administered 2023-12-14: 2 mL

## 2023-12-14 MED ORDER — FENTANYL CITRATE (PF) 100 MCG/2ML IJ SOLN
INTRAMUSCULAR | Status: AC
  Filled 2023-12-14: qty 2

## 2023-12-14 MED ORDER — HEPARIN SODIUM (PORCINE) 1000 UNIT/ML IJ SOLN
INTRAMUSCULAR | Status: DC | PRN
  Administered 2023-12-14: 4000 [IU] via INTRAVENOUS

## 2023-12-14 MED ORDER — SODIUM CHLORIDE 0.9 % WEIGHT BASED INFUSION: 1.0000 mL/kg/h | INTRAVENOUS | Status: DC

## 2023-12-14 MED ORDER — HEPARIN (PORCINE) IN NACL 2-0.9 UNITS/ML
INTRAMUSCULAR | Status: DC | PRN
  Administered 2023-12-14: 10 mL via INTRA_ARTERIAL

## 2023-12-14 MED ORDER — MIDAZOLAM HCL 2 MG/2ML IJ SOLN
INTRAMUSCULAR | Status: DC | PRN
  Administered 2023-12-14 (×2): 1 mg via INTRAVENOUS

## 2023-12-14 SURGICAL SUPPLY — 8 items
CATH INFINITI AMBI 5FR TG (CATHETERS) IMPLANT
CATH INFINITI JR4 5F (CATHETERS) IMPLANT
DEVICE RAD COMP TR BAND LRG (VASCULAR PRODUCTS) IMPLANT
GLIDESHEATH SLEND A-KIT 6F 22G (SHEATH) IMPLANT
GUIDEWIRE ANGLED .035X150CM (WIRE) IMPLANT
GUIDEWIRE INQWIRE 1.5J.035X260 (WIRE) IMPLANT
PACK CARDIAC CATHETERIZATION (CUSTOM PROCEDURE TRAY) ×2 IMPLANT
SET ATX-X65L (MISCELLANEOUS) IMPLANT

## 2023-12-14 NOTE — Progress Notes (Signed)
 TR band removed at 1415, gauze dressing applied. Right radial level 0, clean, dry, and intact. Patient walked to the bathroom without difficulties.

## 2023-12-14 NOTE — Interval H&P Note (Signed)
 History and Physical Interval Note:  12/14/2023 11:35 AM  Jay Day  has presented today for surgery, with the diagnosis of chest pain.  The various methods of treatment have been discussed with the patient and family. After consideration of risks, benefits and other options for treatment, the patient has consented to  Procedure(s): LEFT HEART CATH AND CORONARY ANGIOGRAPHY (N/A) and possible coronary angioplasty as a surgical intervention.  The patient's history has been reviewed, patient examined, no change in status, stable for surgery.  I have reviewed the patient's chart and labs.  Questions were answered to the patient's satisfaction.   Cath Lab Visit (complete for each Cath Lab visit)  Clinical Evaluation Leading to the Procedure:   ACS: No.  Non-ACS:    Anginal Classification: CCS III  Anti-ischemic medical therapy: No Therapy  Non-Invasive Test Results: No non-invasive testing performed  Prior CABG: No previous CABG    Gordy Bergamo

## 2023-12-14 NOTE — Discharge Instructions (Signed)

## 2023-12-16 ENCOUNTER — Encounter (HOSPITAL_COMMUNITY): Payer: Self-pay | Admitting: Cardiology

## 2023-12-27 DIAGNOSIS — I25119 Atherosclerotic heart disease of native coronary artery with unspecified angina pectoris: Secondary | ICD-10-CM | POA: Diagnosis not present

## 2023-12-27 DIAGNOSIS — Z86711 Personal history of pulmonary embolism: Secondary | ICD-10-CM | POA: Diagnosis not present

## 2023-12-27 DIAGNOSIS — E785 Hyperlipidemia, unspecified: Secondary | ICD-10-CM | POA: Diagnosis not present

## 2023-12-29 DIAGNOSIS — D225 Melanocytic nevi of trunk: Secondary | ICD-10-CM | POA: Diagnosis not present

## 2023-12-29 DIAGNOSIS — L821 Other seborrheic keratosis: Secondary | ICD-10-CM | POA: Diagnosis not present

## 2024-02-03 ENCOUNTER — Emergency Department (HOSPITAL_COMMUNITY)
Admission: EM | Admit: 2024-02-03 | Discharge: 2024-02-03 | Disposition: A | Discharge status: Home / Self Care | Attending: Emergency Medicine | Admitting: Emergency Medicine

## 2024-02-03 ENCOUNTER — Encounter (HOSPITAL_COMMUNITY): Payer: Self-pay

## 2024-02-03 ENCOUNTER — Emergency Department (HOSPITAL_COMMUNITY)

## 2024-02-03 ENCOUNTER — Other Ambulatory Visit: Payer: Self-pay

## 2024-02-03 DIAGNOSIS — N3000 Acute cystitis without hematuria: Secondary | ICD-10-CM | POA: Insufficient documentation

## 2024-02-03 DIAGNOSIS — R42 Dizziness and giddiness: Secondary | ICD-10-CM | POA: Diagnosis not present

## 2024-02-03 DIAGNOSIS — Z8673 Personal history of transient ischemic attack (TIA), and cerebral infarction without residual deficits: Secondary | ICD-10-CM | POA: Diagnosis not present

## 2024-02-03 DIAGNOSIS — N179 Acute kidney failure, unspecified: Secondary | ICD-10-CM | POA: Diagnosis not present

## 2024-02-03 DIAGNOSIS — R29818 Other symptoms and signs involving the nervous system: Secondary | ICD-10-CM | POA: Diagnosis not present

## 2024-02-03 DIAGNOSIS — R001 Bradycardia, unspecified: Secondary | ICD-10-CM | POA: Insufficient documentation

## 2024-02-03 DIAGNOSIS — Z7901 Long term (current) use of anticoagulants: Secondary | ICD-10-CM | POA: Diagnosis not present

## 2024-02-03 DIAGNOSIS — R270 Ataxia, unspecified: Secondary | ICD-10-CM | POA: Diagnosis not present

## 2024-02-03 DIAGNOSIS — S37009A Unspecified injury of unspecified kidney, initial encounter: Secondary | ICD-10-CM

## 2024-02-03 DIAGNOSIS — R519 Headache, unspecified: Secondary | ICD-10-CM | POA: Diagnosis not present

## 2024-02-03 DIAGNOSIS — J329 Chronic sinusitis, unspecified: Secondary | ICD-10-CM | POA: Diagnosis not present

## 2024-02-03 LAB — RESP PANEL BY RT-PCR (RSV, FLU A&B, COVID)  RVPGX2
Influenza A by PCR: NEGATIVE
Influenza B by PCR: NEGATIVE
Resp Syncytial Virus by PCR: NEGATIVE
SARS Coronavirus 2 by RT PCR: NEGATIVE

## 2024-02-03 LAB — URINALYSIS, ROUTINE W REFLEX MICROSCOPIC
Bilirubin Urine: NEGATIVE
Glucose, UA: NEGATIVE mg/dL
Hgb urine dipstick: NEGATIVE
Ketones, ur: NEGATIVE mg/dL
Nitrite: POSITIVE — AB
Protein, ur: NEGATIVE mg/dL
Specific Gravity, Urine: 1.008 (ref 1.005–1.030)
WBC, UA: >50 WBC/hpf (ref 0–5)
pH: 7 (ref 5.0–8.0)

## 2024-02-03 LAB — BASIC METABOLIC PANEL WITH GFR
Anion gap: 9 (ref 5–15)
BUN: 19 mg/dL (ref 8–23)
CO2: 24 mmol/L (ref 22–32)
Calcium: 9.6 mg/dL (ref 8.9–10.3)
Chloride: 109 mmol/L (ref 98–111)
Creatinine, Ser: 2.32 mg/dL — ABNORMAL HIGH (ref 0.61–1.24)
GFR, Estimated: 28 mL/min — ABNORMAL LOW (ref >60)
Glucose, Bld: 90 mg/dL (ref 70–99)
Potassium: 3.8 mmol/L (ref 3.5–5.1)
Sodium: 142 mmol/L (ref 135–145)

## 2024-02-03 LAB — AMMONIA: Ammonia: 14 umol/L (ref 9–35)

## 2024-02-03 LAB — CBC
HCT: 42.1 % (ref 39.0–52.0)
Hemoglobin: 14.7 g/dL (ref 13.0–17.0)
MCH: 36.2 pg — ABNORMAL HIGH (ref 26.0–34.0)
MCHC: 34.9 g/dL (ref 30.0–36.0)
MCV: 103.7 fL — ABNORMAL HIGH (ref 80.0–100.0)
Platelets: 121 K/uL — ABNORMAL LOW (ref 150–400)
RBC: 4.06 MIL/uL — ABNORMAL LOW (ref 4.22–5.81)
RDW: 12 % (ref 11.5–15.5)
WBC: 5.7 K/uL (ref 4.0–10.5)
nRBC: 0 % (ref 0.0–0.2)

## 2024-02-03 LAB — TROPONIN I (HIGH SENSITIVITY)
Troponin I (High Sensitivity): 19 ng/L — ABNORMAL HIGH (ref <18)
Troponin I (High Sensitivity): 18 ng/L — ABNORMAL HIGH (ref <18)

## 2024-02-03 MED ORDER — CEPHALEXIN 250 MG PO CAPS
250.0000 mg | ORAL_CAPSULE | Freq: Three times a day (TID) | ORAL | Status: DC
  Administered 2024-02-03: 250 mg via ORAL
  Filled 2024-02-03: qty 1

## 2024-02-03 MED ORDER — CEPHALEXIN 250 MG PO CAPS: 250.0000 mg | ORAL_CAPSULE | Freq: Four times a day (QID) | ORAL | 0 refills | Status: AC

## 2024-02-03 NOTE — Discharge Instructions (Signed)
 Your bloodtests showed a possible urinary infection and also a kidney injury.  Continue drinking lots of water .  Finish the antibiotics prescribed and follow up with your primary care clinic in 1 week to have your urine and creatinine (kidney function) rechecked.

## 2024-02-03 NOTE — ED Triage Notes (Signed)
 Pt bib pov with spouse c/o off balance and dizziness.  Pt states these symptoms has been going on for the past two weeks. Pt says when he walks he feels off.

## 2024-02-03 NOTE — ED Provider Notes (Signed)
 [place] EMERGENCY DEPARTMENT AT [facility] Provider Note   CSN: [id number] Arrival date & time: 02/03/24  1025     Patient presents with: Dizziness   Jay Day is a 83 y.o. male with a history of pulmonary embolism on Eliquis  presented to ED with complaint of balance difficulty and dizziness.  Patient reports that he felt more off balance yesterday evening but it was more pronounced this morning.  He says he feels that he is drunk when he is walking.  He denies slurred speech, blurred vision, numbness or weakness of the arms or legs.  He reports that he had a stroke many years ago and the symptoms feel similar to that.  He denies any persistent chest pain or difficulty breathing.  His wife does report that the patient has been more fatigued than normal the past couple days and seems to want to take naps, which is unusual for him.  Patient reports he is a chronic resting bradycardia and his typical heart rate is in the high 40s and 50s   HPI     Prior to Admission medications   Medication Sig Start Date End Date Taking? Authorizing Provider  cephALEXin  (KEFLEX ) 250 MG capsule Take 1 capsule (250 mg total) by mouth 4 (four) times daily for 7 days. 02/03/24 02/10/24 Yes Jalea Bronaugh, Donnice PARAS, MD  apixaban  (ELIQUIS ) 5 MG TABS tablet TAKE 1 TABLET(5 MG) BY MOUTH TWICE DAILY 12/15/23   Ladona Heinz, MD  bromocriptine  (PARLODEL ) 2.5 MG tablet Take 0.5 tablets (1.25 mg total) by mouth 3 (three) times a week. 09/15/23   Thapa, [place], MD  dorzolamide -timolol  (COSOPT ) 22.3-6.8 MG/ML ophthalmic solution Place 1 drop into both eyes 2 (two) times daily.  06/12/19   [provider]  fluticasone (CUTIVATE) 0.05 % cream Apply 1 Application topically daily. 11/16/23   [provider]  ketoconazole (NIZORAL) 2 % cream Apply 1 Application topically daily. 11/16/23   [provider]  latanoprost  (XALATAN ) 0.005 % ophthalmic solution Place 1 drop into both eyes at bedtime.  11/10/20   [provider]  Multiple Vitamins-Minerals (MULTI FOR HIM 50+) TABS Take 1 tablet by mouth daily.    [provider]  nitroGLYCERIN  (NITROSTAT ) 0.4 MG SL tablet Place 1 tablet under the tongue See admin instructions. As needed 04/09/21   [provider]  rosuvastatin  (CRESTOR ) 5 MG tablet Take 5 mg by mouth 3 (three) times a week.    [provider]  Saccharomyces boulardii (PROBIOTIC) 250 MG CAPS Take 250 mg by mouth daily.    [provider]  Turmeric (QC TUMERIC COMPLEX PO) Take 1 capsule by mouth daily.    [provider]    Allergies: Gluten meal and Wheat    Review of Systems  Updated Vital Signs BP (!) 165/83 (BP Location: Right Arm)   Pulse (!) 48   Temp 97.8 F (36.6 C) (Oral)   Resp 20   Ht 6' (1.829 m)   Wt 79.4 kg   SpO2 100%   BMI 23.73 kg/m   Physical Exam Constitutional:      General: He is not in acute distress. HENT:     Head: Normocephalic and atraumatic.  Eyes:     Conjunctiva/sclera: Conjunctivae normal.     Pupils: Pupils are equal, round, and reactive to light.  Cardiovascular:     Rate and Rhythm: Regular rhythm. Bradycardia present.  Pulmonary:     Effort: Pulmonary effort is normal. No respiratory distress.  Abdominal:  General: There is no distension.     Tenderness: There is no abdominal tenderness.  Skin:    General: Skin is warm and dry.  Neurological:     General: No focal deficit present.     Mental Status: He is alert and oriented to person, place, and time. Mental status is at baseline.     Cranial Nerves: No cranial nerve deficit.     Sensory: No sensory deficit.     Motor: No weakness.     Coordination: Coordination normal.  Psychiatric:        Mood and Affect: Mood normal.        Behavior: Behavior normal.     (all labs ordered are listed, but only abnormal results are displayed) Labs Reviewed  BASIC METABOLIC PANEL WITH GFR - Abnormal; Notable for the  following components:      Result Value   Creatinine, Ser 2.32 (*)    GFR, Estimated 28 (*)    All other components within normal limits  CBC - Abnormal; Notable for the following components:   RBC 4.06 (*)    MCV 103.7 (*)    MCH 36.2 (*)    Platelets 121 (*)    All other components within normal limits  URINALYSIS, ROUTINE W REFLEX MICROSCOPIC - Abnormal; Notable for the following components:   APPearance CLOUDY (*)    Nitrite POSITIVE (*)    Leukocytes,Ua LARGE (*)    Bacteria, UA FEW (*)    All other components within normal limits  TROPONIN I (HIGH SENSITIVITY) - Abnormal; Notable for the following components:   Troponin I (High Sensitivity) 19 (*)    All other components within normal limits  RESP PANEL BY RT-PCR (RSV, FLU A&B, COVID)  RVPGX2  URINE CULTURE  AMMONIA  TROPONIN I (HIGH SENSITIVITY)    EKG: EKG Interpretation Date/Time:  Saturday February 03 2024 10:33:31 EDT Ventricular Rate:  54 PR Interval:  208 QRS Duration:  88 QT Interval:  418 QTC Calculation: 396 R Axis:   70  Text Interpretation: Sinus bradycardia with Premature supraventricular complexes Nonspecific ST abnormality When compared with ECG of 11-Dec-2023 08:40, PREVIOUS ECG IS PRESENT No significant changes Confirmed by Cottie Cough [phone]) on 02/03/2024 11:03:40 AM  Radiology: MR BRAIN WO CONTRAST Result Date: 02/03/2024 CLINICAL DATA:  83 year old male with headache, neurologic deficit, ataxia. EXAM: MRI HEAD WITHOUT CONTRAST TECHNIQUE: Multiplanar, multiecho pulse sequences of the brain and surrounding structures were obtained without intravenous contrast. COMPARISON:  Brain MRI 08/02/2019.  Head CT 10/17/2023. FINDINGS: Brain: No restricted diffusion to suggest acute infarction. No midline shift, mass effect, evidence of mass lesion, ventriculomegaly, extra-axial collection or acute intracranial hemorrhage. Cervicomedullary junction and pituitary are within normal limits. Choroid plexus cysts,  normal variant, stable. Chronic lacunar infarcts of the left corona radiata, left basal ganglia. A subtle chronic lacunar infarct of the left caudate on series 10, image 15 is new since 2021. Contralateral right posterior corona radiata probable perivascular space (normal variant). Stable chronic T2 heterogeneity in the pons. Contralateral right basal ganglia, thalami and cerebellum appear stable and negative. No cortical encephalomalacia or convincing chronic cerebral blood products on SWI. Vascular: Major intracranial vascular flow voids are stable since 2021, dominant right vertebral artery (normal variant). Skull and upper cervical spine: Negative for age visible cervical spine. Visualized bone marrow signal is within normal limits. Sinuses/Orbits: Chronic paranasal sinus disease, postoperative changes to the globes. Other: Mastoids are clear. Visible internal auditory structures appear normal. Negative visible scalp and  face. IMPRESSION: 1. No acute intracranial abnormality. 2. Chronic small vessel disease, with mild progression in the left basal ganglia since a 2021 MRI. Electronically Signed   By: VEAR Hurst M.D.   On: 02/03/2024 12:45     Procedures   Medications Ordered in the ED  cephALEXin  (KEFLEX ) capsule 250 mg (250 mg Oral Given 02/03/24 1524)    Clinical Course as of 02/03/24 1525  Sat Feb 03, 2024  1134 NIHSS 0 on arrival. [MT]    Clinical Course User Index [MT] Jalal Rauch, Donnice PARAS, MD                                 Medical Decision Making Amount and/or Complexity of Data Reviewed Labs: ordered. Radiology: ordered.  Risk Prescription drug management.   This patient presents to the ED with concern for dizziness, balance difficulty. This involves an extensive number of treatment options, and is a complaint that carries with it a high risk of complications and morbidity.  The differential diagnosis includes anemia versus CVA versus metabolic derangement versus other  Patient  does not have any nystagmus on exam and there is no strong positional component to this to suggest that this is peripheral vertigo or BPPV  Co-morbidities that complicate the patient evaluation: High blood pressure, advanced age stroke risk factors.  Additional history obtained from patient's wife at bedside  External records from outside source obtained and reviewed including MRI of the brain from February 2021 noting remote lacunar infarct in the left corona radiata and cyst in the posterior right corona radiata  I ordered and personally interpreted labs.  The pertinent results include:  Cr elevated 2.3. UA consistent with infection  I ordered imaging studies including MRI of the brain I independently visualized and interpreted imaging which showed no emergent findings I agree with the radiologist interpretation  The patient was maintained on a cardiac monitor.  I personally viewed and interpreted the cardiac monitored which showed an underlying rhythm of: Sinus bradycardia HR 50-55 bpm average  Per my interpretation the patient's ECG shows sinus bradycardia, no significant changes from prior tracings, no acute ischemic finding  I ordered medication including antibiotics, renally dosed for GFR < 30, for UTI.  I have reviewed the patients home medicines and have made adjustments as needed  Test Considered: Lower suspicion for acute PE.  The patient does not have hypoxia, chest pain, tachycardia.  He is compliant with Eliquis  at home  After the interventions noted above, I reevaluated the patient and found that they have: stayed the same  Patient was able to ambulate unassisted in the hallway.  I think this is likely a UTI causing recrudescence of his prior infarct.  I do not believe this is sepsis or pyelonephritis.  We discussed his elevated Cr - possibly related to infection (he does not have outlet obstruction symptoms).  He'll need this recheck by his PCP in 1 week.  Okay for  discharge with his wife today.  Disposition:  After consideration of the diagnostic results and the patient's response to treatment, I feel that the patent would benefit from close outpatient follow up.      Final diagnoses:  Acute cystitis without hematuria  Dizziness  Injury of kidney, unspecified laterality, initial encounter    ED Discharge Orders          Ordered    cephALEXin  (KEFLEX ) 250 MG capsule  4 times daily  Note to Pharmacy: Renal adjusted dose   02/03/24 1510               Cottie Donnice PARAS, MD 02/03/24 1525

## 2024-02-03 NOTE — ED Notes (Signed)
 Patient transported to MRI

## 2024-02-04 NOTE — Progress Notes (Deleted)
 Cardiology Office Note:    Date:  02/04/2024   ID:  Jay Day, DOB 1940-11-15, MRN [id number]  PCP:  Elliot Charm, MD  Cardiologist:  [name], MD { Click to update primary MD,subspecialty MD or APP then REFRESH:1}    Referring MD: Elliot Charm,*   Chief Complaint: ***  History of Present Illness:    Jay Day is a 83 y.o. 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 and presents today for follow-up of cardiac catheterization.      Non-Obstructive CAD - History of recurrent chest pain over the years. Initial referred to Cardiology for this in 03/2014.  - Myoview in 03/2014 was intermediate risk with a fixed apical defect. Subsequent LHC in 04/2014 showed only mild non-obstructive CAD with 30% stenosis of mid LAD. LV angiogram showed apical dyskinesis consistent with scar/ infarct and EF of 45-50%.  - Coronary calcium  score in 02/2021 was 472 (58th percentile for age and sex). - Coronary CTA in 04/2021 showed a coronary calcium  score of 400 (54th percentile for age and sex and mild to moderate non-obstructive disease involving the LAD. Also showed LV apical aneurysm with small apical thrombus.  - Coronary calcium  score in 12/2022 was 419 (51st percentile for age and sex). - LHC on 12/14/2023 showed mild-non-obstructive disease with only 30-40% stenosis of mid LAD.   Non-Ischemic Cardiomyopathy - EF noted to be mildly reduced at 45-50% on  LV gram in 2015.  - EF 55-60% 09/2015. - Last Echo in 06/2019 showed LVEF of 55-60% with moderate dyskinesis of apical segment and apical aneurysm (2.7 cm in diameter) but no  thrombus as well as grade 1 diastolic dysfunction, normal RV function, and mild MR.   Paroxysmal Atrial Fibrillation - History of paroxysmal atrial fibrillation noted in chart but details of this are unclear. Personally reviewed all EKGs in system going back to 2015 and don't see any documented atrial fibrillation.  - Needs chronic anticoagulation regardless given history of recurrent PE/ DVT and LV apical thrombus.   LV Apical Thrombus - Small apical thrombus thrombus in setting of LV apical aneurysm was noted on coronary CTA in 04/2021.  - On chronic anticoagulation with Eliquis .  Vasovagal Syncope - Multiple episodes of syncope in 2021 and 2022 felt to be vasovagal in nature as they occurred when he was using the bathroom. Monitor in showed multiple short runs of SVT (longest run 11.9 seconds), one 4 beat run of NSVT, frequent PACs (10.0% burden), and rare PVCs.  - Seen by Dr. Waddell in 04/2021 for further evaluation of this and felt to be be autonomic dysfunction. Watchful waiting was recommended.   Recurrent PE/ DVT - Lower extremity dopplers in 01/2009 showed a focal occlusion/ thrombosis of the greater saphenous vein (unclear whether this was an acute on chronic thrombosis). Repeat dopplers in 08/2011 again showed thrombus within the greater saphenous vein at the level of the mid calf. - Diagnosed with PE in 2012 and was treated with Coumadin .  Recurrent PE in 2013.  - Now on chronic anticoagulation with Eliquis .     Patient was seen by [name] on 12/11/2023 at which time he reported intermittent chest pain over the last  2 weeks. Cardiac catheterization was recommended. LHC on 12/14/2023 showed mild non-obstructive CAD with only 30-40% stenosis of mid LAD.   He was recently seen in the ED on 02/03/2024 for balance difficulty and dizziness. EKG showed sinus bradycardia, rate 54 bpm, with PACs but no acute ischemic changes. High-sensitivity troponin 19 >> 18. Other labs remarkable for AKI  with creatinine of 2.32 (baseline around 1.1 to 1.3). Urinalysis consistent with UTI. Brain MRI showed no acute findings. UTI was felt to be the cause of his symptoms and he was started on Keflex  and discharged with instructions to follow-up with his PCP.   He presents today for follow-up. ***  Chest Pain Mild Non-Obstructive CAD Patient reported intermittent chest pain at last visit in 11/2023. LHC in 12/2023 showed mild non-obstructive CAD with only mild 30-40% stenosis of mid LAD.  - *** - Not on Aspirin  given need for full anticoagulation.  - Continue statin.   Non-Ischemic Cardiomyopathy History of mild non-ischemic cardiomyopathy with EF as low as 45-50% in 2015. However, he has subsequently normalized. Last Echo in LVEF of 55-60% with moderate dyskinesis of apical segment and apical aneurysm (2.7 cm in diameter) but no thrombus as well as grade 1 diastolic dysfunction, normal RV function, and mild MR.  - Euvolemic on exam. No signs or symptoms of CHF.  - GDMT ***  LV Apical Thrombus Noted on coronary CTA in 2022.  - Continue chronic anticoagulation with Eliquis .   Paroxysmal Atrial Fibrillation History of PAF noted in chart but details unclear. I don't see any documented atrial fibrillation on any of the EKGs in our system going back to 2015.  - Maintaining sinus rhythm on exam.  - Continue Eliquis  5mg  twice daily.   Mild Mitral Regurgitation Noted on Echo in 06/2019.  - ***  Hypertension History of hypertension but not currently on any medications.  - BP well controlled.   Hyperlipidemia Lipid panel in 09/2022: Total Cholesterol 118, Triglycerides 70, HDL 48, LDL 55.  - Continue Crestor  5mg  three times weekly (M/ W/ F).   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   EKGs/Labs/Other Studies Reviewed:    The following studies were reviewed:  Echocardiogram 07/09/2019: Impressions: 1. Left ventricular ejection fraction, by visual estimation, is 55 to   60%. The left ventricle has normal function. There is no left ventricular  hypertrophy.   2. Definity  contrast agent was given IV to delineate the left ventricular  endocardial borders.   3. 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 No  thrombus is seen.   4. Left ventricular diastolic parameters are consistent with Grade I  diastolic dysfunction (impaired relaxation).   5. The left ventricle demonstrates regional wall motion abnormalities.   6. Global right ventricle has normal systolic function.The right  ventricular size is normal. No increase in right ventricular wall  thickness.   7. Left atrial size was normal.   8. Right atrial size was normal.   9. The mitral valve is normal in structure. Mild mitral valve  regurgitation. No evidence of mitral stenosis.  10. The tricuspid valve is normal in structure.  11. The tricuspid valve is normal in structure. Tricuspid valve  regurgitation is not demonstrated.  12. The aortic valve is normal in structure. Aortic valve regurgitation is  not visualized. No evidence of aortic valve sclerosis or stenosis.  13. The pulmonic valve was normal in structure. Pulmonic valve  regurgitation is not visualized.  14. The inferior vena cava  is normal in size with greater than 50%  respiratory variability, suggesting right atrial pressure of 3 mmHg.  15. No significant change from prior study. The small apical aneurysm was  seen on that study as well.  16. Prior images reviewed side by side.  17. No evidence of valvular vegetations on this transthoracic  echocardiogram. Would recommend a transesophageal echocardiogram to  exclude infective endocarditis if clinically indicated.  _______________  Left Cardiac Catheterization 12/14/2023: Hemodynamic Data: LV 131/8, EDP 12 mmHg.  Ao 118/69, mean 89 mmHg.  There is no pressure gradient across the aortic valve.   Angiographic Data: LV: Normal LVEF, no regional wall motion normality.   EF 55 to 60%. LM: Very large caliber vessel, mildly calcified. LAD: Very large.  Vessel gives origin to a very large D1.  In the midsegment of the LAD, there is mildly calcific 30 to at most 40% stenosis which does not appear to be flow-limiting.  Most views it appears to be 30%. LCx: Very large caliber vessel and codominant with RCA.  Large OM 2.  Moderate-sized PDA branches distally.  No significant disease.  Mild calcification is evident. RCA: Large-caliber vessel, gives origin to a moderate-sized PDA.  There is no significant disease in the RCA.      Impression and Recommendations: No significant coronary artery disease by cardiac catheterization.  Evaluate for noncardiac causes of chest pain.  No indication for aspirin .  EKG:  EKG not ordered today.   Recent Labs: 10/17/2023: ALT 18 02/03/2024: BUN 19; Creatinine, Ser 2.32; Hemoglobin 14.7; Platelets 121; Potassium 3.8; Sodium 142  Recent Lipid Panel    Component Value Date/Time   CHOL 118 10/04/2022 0910   TRIG 70 10/04/2022 0910   HDL 48 10/04/2022 0910   CHOLHDL 2.5 10/04/2022 0910   CHOLHDL 4.0 09/14/2015 0618   VLDL 39 09/14/2015 0618   LDLCALC 55 10/04/2022 0910    Physical Exam:    Vital Signs: There were no vitals taken for this visit.    Wt Readings from Last 3 Encounters:  02/03/24 175 lb (79.4 kg)  12/14/23 180 lb (81.6 kg)  12/11/23 180 lb 9.6 oz (81.9 kg)     General: 83 y.o. male in no acute distress. HEENT: Normocephalic and atraumatic. Sclera clear.  Neck: Supple. No carotid bruits. No JVD. Heart: *** RRR. Distinct S1 and S2. No murmurs, gallops, or rubs.  Lungs: No increased work of breathing. Clear to ausculation bilaterally. No wheezes, rhonchi, or rales.  Abdomen: Soft, non-distended, and non-tender to palpation.  Extremities: No lower extremity edema.  Radial and distal pedal pulses 2+ and equal bilaterally. Skin: Warm and dry. Neuro: No focal deficits. Psych: Normal affect. Responds  appropriately.   Assessment:    No diagnosis found.  Plan:     Disposition: Follow up in ***   Signed, Aline FORBES Jadine DEVONNA  02/04/2024 8:48 AM    [facility]

## 2024-02-05 LAB — URINE CULTURE: Culture: >=100000 — AB

## 2024-02-06 NOTE — Progress Notes (Unsigned)
 Cardiology Office Note:  .   Date:  02/07/2024  ID:  Jay Day, DOB [birth year], MRN [id number] PCP: Elliot Charm, MD  [facility] Providers Cardiologist:  [name], MD {  History of Present Illness: .   Jay Day is a 83 y.o. male  with PMHx of CAD (cath 12/2023 showed no significant CAD with 30% stenosis in mid LAD, mild calcification in Lcx), afib (on Eliquis ), LV apical aneurysm w/ thrombosis in 2021 (on Eliquis ), hx of PE x2 in 2012 & 2013, HTN (controlled with lifestyle modifications), CVA in 2017, aortic arthrosclerosis who reports to [facility] office for follow up.   Last seen in heartcare OV 12/11/2023 with [name] with concerns for chest pain. Continue Crestor  5 mg MWF and Eliquis  5 mig BID. Scheduled for outpatient LHC on 12/14/2023, which showed no significant CAD with 30% stenosis in mid LAD, mild calcification in Lcx and EF 55-60%. Recommended to evaluate for noncardiac chest pain etiologies.   Today, patient accompanied by his wife. Reports significant improvement in chest pain but still present, however less frequent and less severe. CP described as tightness, 4/10, located on the left lower chest wall along the rib, relieved with laying down, lasting for hours. Patient is able to exercise 7 days per week for 30-45 mins either swimming or cardio without any exertional concerns. Notes significant improvement in CP noticed after stopping Flax milk, which they have heard contributes to GI issues that can cause chest pain. Denies any n/v, diaphoresis, SOB, palpations, edema, or syncope. Reports compliance with medications. He follow a plant-based diet, eats very little meat, and consumes a lot of salads. Denies tobacco use/alcohol/drug use.   Also reports that PCP recently recommended starting BP medication with BP, however, patient deferred due to home BP usually < 130/80. PCP also recommended increasing Crestor .   Also, report recent ED visit for dizziness  and imbalance with concern for a stroke, however ED workup was negative and was diagnosed with UTI causing recrudescence of his prior infarct. Reviewed ED chart, which was consistent report. Dizziness and imbalance has resolved with antibiotic treatment.   ROS: 10 point review of system has been reviewed and considered negative except ones been listed in the HPI.   Studies Reviewed: SABRA   LHC 12/2023 Cardiac Catheterization 12/14/23: Hemodynamic data: LV 131/8, EDP 12 mmHg.  Ao 118/69, mean 89 mmHg.  There is no pressure gradient across the aortic valve.   Angiographic data: LV: Normal LVEF, no regional wall motion normality.  EF 55 to 60%. LM: Very large caliber vessel, mildly calcified. LAD: Very large.  Vessel gives origin to a very large D1.  In the midsegment of the LAD, there is mildly calcific 30 to at most 40% stenosis which does not appear to be flow-limiting.  Most views it appears to be 30%. LCx: Very large caliber vessel and codominant with RCA.  Large OM 2.  Moderate-sized PDA branches distally.  No significant disease.  Mild calcification is evident. RCA: Large-caliber vessel, gives origin to a moderate-sized PDA.  There is no significant disease in the RCA.    Impression and recommendations: No significant coronary artery disease by cardiac catheterization.  Evaluate for noncardiac causes of chest pain.  No indication for aspirin .  Risk Assessment/Calculations:    CHA2DS2-VASc Score = 6   This indicates a 9.7% annual risk of stroke. The patient's score is based upon: CHF History: 0 HTN History: 1 Diabetes History: 0 Stroke History: 2 Vascular Disease  History: 1 Age Score: 2 Gender Score: 0   Physical Exam:   VS:  BP 128/84   Pulse (!) 52   Ht 6' (1.829 m)   Wt 184 lb 9.6 oz (83.7 kg)   SpO2 98%   BMI 25.04 kg/m    Wt Readings from Last 3 Encounters:  02/07/24 184 lb 9.6 oz (83.7 kg)  02/03/24 175 lb (79.4 kg)  12/14/23 180 lb (81.6 kg)    GEN: Well  nourished, well developed in no acute distress while sitting in chair.  NECK: No JVD; No carotid bruits CARDIAC: RRR, no murmurs, rubs, gallops RESPIRATORY:  Clear to auscultation without rales, wheezing or rhonchi  ABDOMEN: Soft, non-tender, non-distended EXTREMITIES:  No edema; No deformity   ASSESSMENT AND PLAN: .    Coronary artery disease involving native coronary artery of native heart without angina pectoris Hyperlipidemia LDL goal <70 CCTA in 2022 with CAC score of 419 and proximal LAD stenosis of 49%  most recent cath 12/2023 showed no significant CAD with 30% stenosis in mid LAD, mild calcification in Lcx, EF 55-60%. Reviewed cath results and patient understands.  Reports significant improvement in chest pain but still present, however less frequent and less severe. CP described as tightness, 4/10, located on the left lower chest wall along the rib, relieved with laying down, lasting for hours. Denies any exertional CP with exercise for 30-45 mins.  Patient does not want to try antianginal medications since chest pain has improved. Can reconsider if symptoms worsen. Would discuss again at next OV if CP still present.  Also reports that PCP recommended increasing Crestor . Most recent LDL 63 in 06/2023 is below LDL goal of 70. Advised LDL is at optimal goal, however daily statin could reduce further build up of cholestrol in coronary arteries. Recommend increasing Crestor  to 5 daily or 2.5 mg daily. Patient is concerned about statin side effect of brain damage. Discussed the most common SE of statin is muscle aches. Patient will follow up with PCP to discuss further. Agrees to continue Crestor  5 mg MWF in the mean time.   Paroxysmal atrial fibrillation (HCC) Reviewed EKG 02/03/2024: sinus bradycardia with PAC and nonspecific T wave changes.  On exam noted regular, rate and rhythm.  Denies palpitations or active bleeding.  On AC with stable CBC in 01/2024.  Continue Eliquis  5 mg  BID  Mitral Valve Regurgitation  Echo 2021: mild MC regurgitation  Order Echo for reassessment   Hypertension  Noted previously on BP medication about 40 years ago but has been well managed with healthy lifestyle changes.  BP this OV well controlled today: 128/84 Reports that PCP recently recommended starting BP medication with BP, however, patient deferred due to home BP usually < 130/80. Reviewed recent BP in mychart over the past year with majority well controlled.  Recommended to continue to monitor BP at home and contact office if BP remains elevated >130/80.  Ok to manage with lifestyle modifications.  Encourage physical activity for 150 minutes per week and heart healthy low sodium diet. Discussed limiting sodium intake to < 2 grams daily.    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 Continue Eliquis  5 mg BID  History of pulmonary embolism Two pulmonary embolisms in 2012 and 2013, worked up by hematology with negative genetic cause. Recommended lifelong anticoagulation. Continue Eliquis  5 mg BID     Dispo: Follow up as  scheduled in December   Signed, Lorette CINDERELLA Kapur, PA-C

## 2024-02-07 ENCOUNTER — Encounter: Payer: Self-pay | Admitting: Student

## 2024-02-07 ENCOUNTER — Ambulatory Visit: Attending: Cardiology | Admitting: Physician Assistant

## 2024-02-07 VITALS — BP 128/84 | HR 52 | Ht 72.0 in | Wt 184.6 lb

## 2024-02-07 DIAGNOSIS — I2510 Atherosclerotic heart disease of native coronary artery without angina pectoris: Secondary | ICD-10-CM | POA: Diagnosis not present

## 2024-02-07 DIAGNOSIS — I253 Aneurysm of heart: Secondary | ICD-10-CM

## 2024-02-07 DIAGNOSIS — I340 Nonrheumatic mitral (valve) insufficiency: Secondary | ICD-10-CM | POA: Diagnosis not present

## 2024-02-07 DIAGNOSIS — E785 Hyperlipidemia, unspecified: Secondary | ICD-10-CM

## 2024-02-07 DIAGNOSIS — I480 Paroxysmal atrial fibrillation: Secondary | ICD-10-CM | POA: Diagnosis not present

## 2024-02-07 DIAGNOSIS — I513 Intracardiac thrombosis, not elsewhere classified: Secondary | ICD-10-CM

## 2024-02-07 DIAGNOSIS — I10 Essential (primary) hypertension: Secondary | ICD-10-CM

## 2024-02-07 DIAGNOSIS — Z86711 Personal history of pulmonary embolism: Secondary | ICD-10-CM

## 2024-02-07 NOTE — Patient Instructions (Addendum)
 Medication Instructions:  Your physician recommends that you continue on your current medications as directed. Please refer to the Current Medication list given to you today. *If you need a refill on your cardiac medications before your next appointment, please call your pharmacy*  Lab Work: None  If you have labs (blood work) drawn today and your tests are completely normal, you will receive your results only by: MyChart Message (if you have MyChart) OR A paper copy in the mail If you have any lab test that is abnormal or we need to change your treatment, we will call you to review the results.  Testing/Procedures: Your physician has requested that you have an echocardiogram. Echocardiography is a painless test that uses sound waves to create images of your heart. It provides your doctor with information about the size and shape of your heart and how well your heart's chambers and valves are working. This procedure takes approximately one hour. There are no restrictions for this procedure. Please do NOT wear cologne, perfume, aftershave, or lotions (deodorant is allowed). Please arrive 15 minutes prior to your appointment time.  Please note: We ask at that you not bring children with you during ultrasound (echo/ vascular) testing. Due to room size and safety concerns, children are not allowed in the ultrasound rooms during exams. Our front office staff cannot provide observation of children in our lobby area while testing is being conducted. An adult accompanying a patient to their appointment will only be allowed in the ultrasound room at the discretion of the ultrasound technician under special circumstances. We apologize for any inconvenience.  Follow-Up: At [facility], you and your health needs are our priority.  As part of our continuing mission to provide you with exceptional heart care, our providers are all part of one team.  This team includes your primary Cardiologist  (physician) and Advanced Practice Providers or APPs (Physician Assistants and Nurse Practitioners) who all work together to provide you with the care you need, when you need it.  Your next appointment:   Follow Up As scheduled in December 2025   Provider:   Josefa Beauvais, NP    We recommend signing up for the patient portal called MyChart.  Sign up information is provided on this After Visit Summary.  MyChart is used to connect with patients for Virtual Visits (Telemedicine).  Patients are able to view lab/test results, encounter notes, upcoming appointments, etc.  Non-urgent messages can be sent to your provider as well.   To learn more about what you can do with MyChart, go to [email or web address].   Other Instructions  Notify our office if your Blood Pressure is Consistently over 130/80

## 2024-02-08 ENCOUNTER — Other Ambulatory Visit: Payer: Self-pay | Admitting: *Deleted

## 2024-02-08 ENCOUNTER — Ambulatory Visit (HOSPITAL_COMMUNITY)

## 2024-02-08 ENCOUNTER — Encounter: Payer: Self-pay | Admitting: Physician Assistant

## 2024-02-14 DIAGNOSIS — Z86711 Personal history of pulmonary embolism: Secondary | ICD-10-CM | POA: Diagnosis not present

## 2024-02-14 DIAGNOSIS — A498 Other bacterial infections of unspecified site: Secondary | ICD-10-CM | POA: Diagnosis not present

## 2024-02-14 DIAGNOSIS — Z8673 Personal history of transient ischemic attack (TIA), and cerebral infarction without residual deficits: Secondary | ICD-10-CM | POA: Diagnosis not present

## 2024-02-14 DIAGNOSIS — D6859 Other primary thrombophilia: Secondary | ICD-10-CM | POA: Diagnosis not present

## 2024-02-14 DIAGNOSIS — I10 Essential (primary) hypertension: Secondary | ICD-10-CM | POA: Diagnosis not present

## 2024-02-15 DIAGNOSIS — X32XXXD Exposure to sunlight, subsequent encounter: Secondary | ICD-10-CM | POA: Diagnosis not present

## 2024-02-15 DIAGNOSIS — D225 Melanocytic nevi of trunk: Secondary | ICD-10-CM | POA: Diagnosis not present

## 2024-02-15 DIAGNOSIS — L821 Other seborrheic keratosis: Secondary | ICD-10-CM | POA: Diagnosis not present

## 2024-02-15 DIAGNOSIS — L570 Actinic keratosis: Secondary | ICD-10-CM | POA: Diagnosis not present

## 2024-02-19 DIAGNOSIS — H401131 Primary open-angle glaucoma, bilateral, mild stage: Secondary | ICD-10-CM | POA: Diagnosis not present

## 2024-03-12 ENCOUNTER — Ambulatory Visit (HOSPITAL_COMMUNITY)
Admission: RE | Admit: 2024-03-12 | Discharge: 2024-03-12 | Disposition: A | Discharge status: Home / Self Care | Source: Ambulatory Visit | Attending: Cardiovascular Disease | Admitting: Cardiovascular Disease

## 2024-03-12 DIAGNOSIS — I340 Nonrheumatic mitral (valve) insufficiency: Secondary | ICD-10-CM | POA: Insufficient documentation

## 2024-03-12 LAB — ECHOCARDIOGRAM COMPLETE
Area-P 1/2: 2.83 cm2
S' Lateral: 2 cm

## 2024-03-14 ENCOUNTER — Ambulatory Visit: Admitting: Endocrinology

## 2024-03-15 ENCOUNTER — Ambulatory Visit: Payer: Self-pay | Admitting: Physician Assistant

## 2024-03-15 DIAGNOSIS — I253 Aneurysm of heart: Secondary | ICD-10-CM

## 2024-03-22 ENCOUNTER — Ambulatory Visit: Admitting: Endocrinology

## 2024-03-22 ENCOUNTER — Other Ambulatory Visit

## 2024-03-22 ENCOUNTER — Encounter: Payer: Self-pay | Admitting: Endocrinology

## 2024-03-22 VITALS — BP 112/60 | HR 57 | Resp 16 | Ht 72.0 in | Wt 187.0 lb

## 2024-03-22 DIAGNOSIS — E221 Hyperprolactinemia: Secondary | ICD-10-CM

## 2024-03-22 DIAGNOSIS — D352 Benign neoplasm of pituitary gland: Secondary | ICD-10-CM

## 2024-03-22 DIAGNOSIS — T1501XA Foreign body in cornea, right eye, initial encounter: Secondary | ICD-10-CM | POA: Diagnosis not present

## 2024-03-22 LAB — PROLACTIN: Prolactin: 7 ng/mL (ref 2.0–18.0)

## 2024-03-22 LAB — T4, FREE: Free T4: 1.2 ng/dL (ref 0.8–1.8)

## 2024-03-22 LAB — TSH: TSH: 3.39 m[IU]/L (ref 0.40–4.50)

## 2024-03-22 NOTE — Progress Notes (Signed)
 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  PCP: Elliot Charm, MD  HISTORY OF PRESENT ILLNESS:   Jay Day is a 83 y.o. old male with past medical history listed below, is here for follow up for pituitary concerns /prolactinoma/hyperprolactinemia.   Pertinent Pituitary History: Apparently in 2003 he was being evaluated by his PCP for complaints of decreased libido. He did not have any other symptoms except some feeling of low energy in the morning. Details of his initial evaluation are not available but apparently was found to have a high prolactin level of 99 along with a 1.4 cm right-sided pituitary tumor.  He was treated by his internist with bromocriptine .   Because of his relatively low prolactin level with 2.5 mg of bromocriptine  he was tried on half tablet in 2012 and prolactin was normal at 5.2 subsequently. He had been taking 2.5 mg again for some time but his prolactin level in 11/2011 was only 2.0, in 7/14 was 1.9 and in 01/2014 his prolactin level was low at 1.6    No evidence of hypopituitarism on his evaluation in 10/2014. Testosterone  level has been normal previously. Free T4 has been consistently normal.   MRIs:  Follow-up MRI in 04/2012 did not show any change in size of the tumor.  MRI brain scan done for unrelated reasons in 06/2016 showed:    T2 hyperintense focus centered within the right aspect of the pituitary gland and cavernous sinus best appreciated on the coronal sequence is decreased in size from the 2003 pituitary MRI of the brain and probably represents residual adenoma. Description of pituitary not in MRI done in 07/2019   He has been on variable doses of bromocriptine  in the last few years. Patient has been treated long-term with low doses of bromocriptine     When he was not taking bromocriptine  his  prolactin went up to 44 in 04/2019 and up to 27 as of 10/23.    Interval history Patient has been taking bromocriptine  half tablet 3 times a week.  He has no new headache or peripheral vision loss.  No breast tissue or galactorrhea.  Overall feeling good.  No complaints today.  REVIEW OF SYSTEMS:  As per history of present illness.   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   in situ-mid upper chest (txpbx)   Stroke (HCC) 2017   mild cva   Stroke [facility]) 2016    PAST SURGICAL HISTORY: Past Surgical History:  Procedure Laterality Date   COLONOSCOPY N/A 08/22/2016   Procedure: COLONOSCOPY;  Surgeon: Gladis MARLA Louder, MD;  Location: [facility] ENDOSCOPY;  Service: Endoscopy;  Laterality: N/A;   colonscopy  2007   EYE SURGERY     bilateral cataract surgery   HERNIA REPAIR  1960   HERNIA REPAIR     INGUINAL HERNIA REPAIR  08/12/2011   Procedure: LAPAROSCOPIC INGUINAL HERNIA;  Surgeon:  Elon CHRISTELLA Pacini, MD;  Location: [facility] OR;  Service: General;  Laterality: Left;   KNEE SURGERY  1962   left    LEFT HEART CATH AND CORONARY ANGIOGRAPHY N/A 12/14/2023   Procedure: LEFT HEART CATH AND CORONARY ANGIOGRAPHY;  Surgeon: Ladona Heinz, MD;  Location: [facility] INVASIVE CV LAB;  Service: Cardiovascular;  Laterality: N/A;   LEFT HEART CATHETERIZATION WITH CORONARY ANGIOGRAM N/A 05/06/2014   Procedure: LEFT HEART CATHETERIZATION WITH CORONARY ANGIOGRAM;  Surgeon: [name], MD;  Location: [facility] CATH LAB;  Service: Cardiovascular;  Laterality: N/A;   LYMPHADENECTOMY Bilateral 09/06/2019   Procedure: LYMPHADENECTOMY;  Surgeon: Alvaro Hummer, MD;  Location: [facility] ORS;  Service: Urology;  Laterality: Bilateral;   ROBOT ASSISTED LAPAROSCOPIC RADICAL PROSTATECTOMY N/A 09/06/2019   Procedure: XI ROBOTIC ASSISTED LAPAROSCOPIC RADICAL PROSTATECTOMY;  Surgeon: Alvaro Hummer, MD;  Location: [facility] ORS;  Service: Urology;  Laterality: N/A;  3 HRS   TONSILLECTOMY     as a child    ALLERGIES: Allergies  Allergen Reactions   Gluten Meal Other (See Comments)    Gluten Sensitivity (reaction undefined) NO BREAD   Wheat Other (See Comments)    Reaction undefined    FAMILY HISTORY:  Family History  Problem Relation Age of Onset   Pulmonary embolism Mother    Kidney failure Mother    Hypertension Mother    Kidney Stones Mother    Pulmonary embolism Father    Prostate cancer Father    Pulmonary embolism Maternal Grandmother    Pulmonary embolism Maternal Grandfather    Pulmonary embolism Paternal Grandmother    Pulmonary embolism Paternal Grandfather     SOCIAL HISTORY: Social History   Socioeconomic History   Marital status: Married    Spouse name: Not on file   Number of children: 2   Years of education: BA   Highest education level: Not on file  Occupational History   Occupation: [employer]  Tobacco Use   Smoking status: Former    Current packs/day: 0.00    Average packs/day: 0.3 packs/day  for 5.0 years (1.5 ttl pk-yrs)    Types: Cigarettes    Start date: 1963    Quit date: 1968    Years since quitting: 57.8   Smokeless tobacco: Never  Vaping Use   Vaping status: Never Used  Substance and Sexual Activity   Alcohol use: Never   Drug use: Never   Sexual activity: Not on file  Other Topics Concern   Not on file  Social History Narrative   ** Merged History Encounter **       Patient drinks about 2 cups of caffeine daily. Patient is left handed.   Social Drivers of Corporate investment banker Strain: Not  on file  Food Insecurity: Not on file  Transportation Needs: Not on file  Physical Activity: Not on file  Stress: Not on file  Social Connections: Not on file    MEDICATIONS:  Current Outpatient Medications  Medication Sig Dispense Refill   apixaban  (ELIQUIS ) 5 MG TABS tablet TAKE 1 TABLET(5 MG) BY MOUTH TWICE DAILY     bromocriptine  (PARLODEL ) 2.5 MG tablet Take 0.5 tablets (1.25 mg total) by mouth 3 (three) times a week. 20 tablet 3   dorzolamide -timolol  (COSOPT ) 22.3-6.8 MG/ML ophthalmic solution Place 1 drop into both eyes 2 (two) times daily.      fluticasone (CUTIVATE) 0.05 % cream Apply 1 Application topically daily.     ketoconazole (NIZORAL) 2 % cream Apply 1 Application topically daily.     latanoprost  (XALATAN ) 0.005 % ophthalmic solution Place 1 drop into both eyes at bedtime.     Multiple Vitamins-Minerals (MULTI FOR HIM 50+) TABS Take 1 tablet by mouth daily.     nitroGLYCERIN  (NITROSTAT ) 0.4 MG SL tablet Place 1 tablet under the tongue See admin instructions. As needed     rosuvastatin  (CRESTOR ) 5 MG tablet Take 5 mg by mouth 3 (three) times a week.     Saccharomyces boulardii (PROBIOTIC) 250 MG CAPS Take 250 mg by mouth daily.     Turmeric (QC TUMERIC COMPLEX PO) Take 1 capsule by mouth daily.     No current facility-administered medications for this visit.    PHYSICAL EXAM: Vitals:   03/22/24 0847  BP: 112/60  Pulse: (!) 57  Resp: 16   SpO2: 95%  Weight: 187 lb (84.8 kg)  Height: 6' (1.829 m)    Body mass index is 25.36 kg/m.  Wt Readings from Last 3 Encounters:  03/22/24 187 lb (84.8 kg)  02/07/24 184 lb 9.6 oz (83.7 kg)  02/03/24 175 lb (79.4 kg)    General: Well developed, well nourished male in no apparent distress. No cushingoid and acromegalic appearance HEENT: AT/[place], no external lesions. Hearing intact to the spoken word Eyes: EOMI. Conjunctiva clear and no icterus. Visual Field by confrontation grossly intact Neck: Trachea midline, neck supple without appreciable thyromegaly or lymphadenopathy and no palpable thyroid  nodules Abdomen: Soft, non tender, non distended Neurologic: Alert, oriented, normal speech, deep tendon biceps reflexes normal,  no gross focal neurological deficit Extremities: No pedal pitting edema, no tremors of outstretched hands Skin: Warm, color good.  Psychiatric: Does not appear depressed or anxious  PERTINENT HISTORIC LABORATORY AND IMAGING STUDIES:  All pertinent laboratory results were reviewed. Please see HPI also for further details.   ASSESSMENT / PLAN  1. Prolactin secreting pituitary adenoma (HCC)   2. Hyperprolactinemia    Patient has longstanding history of prolactinoma 1.4 cm at baseline in 2003 and initial prolactin level was high at 99.  Initial symptoms were mainly related to decreased libido.  He has been on bromocriptine  1.25 mg, taking at bedtime on Mondays/Wednesdays and Fridays. With this his prolactin is usually well-controlled and he has no current symptoms   With previous trials of stopping his bromocriptine  his prolactin has gone up to as high as 43.6.    Last MRI of brain in 2021 did not show any obvious pituitary enlargement.   Since he had a macroadenoma of the pituitary he will continue low-dose bromocriptine  long-term.  Plan: -Will check prolactin level today.  Will also like to check thyroid  function test today. - Currently taking bromocriptine   1.25 mg 3 times a week. - Follow-up in  6 months.  Will ultimately turn into annual visit.    Diagnoses and all orders for this visit:  Prolactin secreting pituitary adenoma (HCC)  Hyperprolactinemia -     T4, free -     TSH -     Prolactin    DISPOSITION Follow up in clinic in 6 months suggested.  Labs today and prior to follow-up visit.  All questions answered and patient verbalized understanding of the plan.  [place] Marvia Troost, MD [facility] End[facility] [address], [address] [place], [place] [address] Phone # [phone]  At least part of this note was generated using voice recognition software. Inadvertent word errors may have occurred, which were not recognized during the proofreading process.

## 2024-03-25 ENCOUNTER — Ambulatory Visit: Payer: Self-pay | Admitting: Endocrinology

## 2024-03-25 DIAGNOSIS — E221 Hyperprolactinemia: Secondary | ICD-10-CM

## 2024-03-27 ENCOUNTER — Ambulatory Visit (HOSPITAL_COMMUNITY)
Admission: RE | Admit: 2024-03-27 | Discharge: 2024-03-27 | Disposition: A | Discharge status: Home / Self Care | Source: Ambulatory Visit | Attending: Cardiovascular Disease | Admitting: Cardiovascular Disease

## 2024-03-27 DIAGNOSIS — I253 Aneurysm of heart: Secondary | ICD-10-CM | POA: Insufficient documentation

## 2024-03-27 LAB — ECHOCARDIOGRAM LIMITED
Area-P 1/2: 3.21 cm2
S' Lateral: 2.87 cm

## 2024-03-27 MED ORDER — PERFLUTREN LIPID MICROSPHERE
1.0000 mL | INTRAVENOUS | Status: AC | PRN
  Administered 2024-03-27: 2 mL via INTRAVENOUS

## 2024-04-01 ENCOUNTER — Ambulatory Visit: Payer: Self-pay | Admitting: Physician Assistant

## 2024-04-01 DIAGNOSIS — I513 Intracardiac thrombosis, not elsewhere classified: Secondary | ICD-10-CM

## 2024-04-01 DIAGNOSIS — I253 Aneurysm of heart: Secondary | ICD-10-CM

## 2024-04-04 NOTE — Addendum Note (Signed)
 Addended by: SHERON HALLMARK on: 04/04/2024 02:01 PM   Modules accepted: Orders

## 2024-04-04 NOTE — Telephone Encounter (Signed)
 Patient is following up regarding MRI order. He would like to go ahead an schedule but order has not been placed.

## 2024-04-10 ENCOUNTER — Telehealth (HOSPITAL_COMMUNITY): Payer: Self-pay | Admitting: Emergency Medicine

## 2024-04-10 ENCOUNTER — Other Ambulatory Visit (HOSPITAL_COMMUNITY): Payer: Self-pay | Admitting: Emergency Medicine

## 2024-04-10 DIAGNOSIS — N179 Acute kidney failure, unspecified: Secondary | ICD-10-CM

## 2024-04-10 NOTE — Telephone Encounter (Signed)
 Reaching out to patient to offer assistance regarding upcoming cardiac imaging study; pt verbalizes understanding of appt date/time, parking situation and where to check in, pre-test NPO status and medications ordered, and verified current allergies; name and call back number provided for further questions should they arise Camie Shutter RN Navigator Cardiac Imaging [facility] [phone] office [phone] cell   Pt aware to get labs tomorrow.

## 2024-04-11 ENCOUNTER — Other Ambulatory Visit: Payer: Self-pay

## 2024-04-11 DIAGNOSIS — I253 Aneurysm of heart: Secondary | ICD-10-CM

## 2024-04-11 DIAGNOSIS — I513 Intracardiac thrombosis, not elsewhere classified: Secondary | ICD-10-CM

## 2024-04-11 DIAGNOSIS — N179 Acute kidney failure, unspecified: Secondary | ICD-10-CM

## 2024-04-12 ENCOUNTER — Ambulatory Visit (HOSPITAL_COMMUNITY)
Admission: RE | Admit: 2024-04-12 | Discharge: 2024-04-12 | Disposition: A | Discharge status: Home / Self Care | Source: Ambulatory Visit | Attending: Physician Assistant | Admitting: Physician Assistant

## 2024-04-12 ENCOUNTER — Other Ambulatory Visit: Payer: Self-pay | Admitting: Physician Assistant

## 2024-04-12 DIAGNOSIS — I253 Aneurysm of heart: Secondary | ICD-10-CM | POA: Diagnosis not present

## 2024-04-12 DIAGNOSIS — I513 Intracardiac thrombosis, not elsewhere classified: Secondary | ICD-10-CM

## 2024-04-12 LAB — BASIC METABOLIC PANEL WITH GFR
BUN/Creatinine Ratio: 17 (ref 10–24)
BUN: 22 mg/dL (ref 8–27)
CO2: 24 mmol/L (ref 20–29)
Calcium: 9.1 mg/dL (ref 8.6–10.2)
Chloride: 105 mmol/L (ref 96–106)
Creatinine, Ser: 1.29 mg/dL — ABNORMAL HIGH (ref 0.76–1.27)
Glucose: 85 mg/dL (ref 70–99)
Potassium: 4.3 mmol/L (ref 3.5–5.2)
Sodium: 141 mmol/L (ref 134–144)
eGFR: 56 mL/min/1.73 — ABNORMAL LOW (ref >59)

## 2024-04-12 MED ORDER — GADOBUTROL 1 MMOL/ML IV SOLN
11.0000 mL | Freq: Once | INTRAVENOUS | Status: AC | PRN
  Administered 2024-04-12: 11 mL via INTRAVENOUS

## 2024-04-14 ENCOUNTER — Ambulatory Visit: Payer: Self-pay | Admitting: Physician Assistant

## 2024-04-15 ENCOUNTER — Ambulatory Visit: Payer: Self-pay | Admitting: Physician Assistant

## 2024-05-19 NOTE — Progress Notes (Unsigned)
 Cardiology Office Note   Date: 05/21/2024 ID:  Jay Day Jay Day 10/25/1988, MRN [id number] PCP: Jesus Bernardino MATSU, MD  [facility] Providers Cardiologist: [name], MD     Chief Complaint: Jay Day is a 83 y.o.male with PMH of non-obstructive CAD, PAF, LV apical aneurysm with thrombosis, PE 2012 and 2013, hypertension, CVA who presents to the clinic for routine follow-up.    Jay Day established cardiology care after cardiac catheterization 05/06/14 showed 30% mid LAD. EF was 45-50%. Echo 09/14/15 LVEF 55-60%, moderate LVH. 5 Hospitalized after syncopal episode in the setting of pneumonia and fever 103. Cardiology referral, saw [name] 08/01/18 and was instructed to follow-up as needed. After several syncopal episodes attributed to vasovagal response resulting in falls, metoprolol  was discontinued by [name].  Coronary calcium  score ordered by PCP resulted 02/23/21 showing score of 472, 58th percentile. FFR reassuring without flow-limiting disease. Sustained another syncopal event and saw [name] again 04/01/21 who referred him to EP. Dr. Waddell suggested watchful waiting, as syncope seemed to be in the middle of the night during BM.  Referred to lipid clinic after muscle aches with statins, but ultimately decided to take rosuvastatin  5 mg MWF.   Seen by [name] 12/11/23 with intermittent chest pain after exercise. Cardiac catheterization recommended, done 12/14/23, showing 30% LAD stenosis. EF 55-60%.  Office visit 02/07/24 was doing well, chest pain still occurring occasionally but had improved after stopping Flax milk. Echo 03/12/24 showed LVEF 55-60%, G1DD, RV normal, mild to mod MR, tricuspid AV, ascending aorta 40 mm, apical aneurysm noted. Limited echo noted apical aneurysm with thrombus. [name] recommended cardiac MRI. This was done 04/12/24 and showed no LV thrombus. Question of sarcoidosis was mentioned, with recommendations for cardiac PET to better  assess.    History of Present Illness: Today he is doing well. Continues to exercise daily, swims and lifts light weight. Cooks most meals, minimal meat intake but likes chicken and salmon. Has occasional discomfort in his chest, notes antacids relieve this. Denies cardiac chest pain, shortness of breath, palpitations, peripheral edema, dizziness, syncope, or pre-syncope.  ROS: Please see the history of present illness. All other systems reviewed and are negative.   Studies Reviewed: The following studies were personally reviewed today: Cardiac Studies & Procedures   ______________________________________________________________________________________________ CARDIAC CATHETERIZATION  CARDIAC CATHETERIZATION 12/14/2023  Conclusion Images from the original result were not included. Cardiac Catheterization 12/14/23: Hemodynamic data: LV 131/8, EDP 12 mmHg.  Ao 118/69, mean 89 mmHg.  There is no pressure gradient across the aortic valve.  Angiographic data: LV: Normal LVEF, no regional wall motion normality.  EF 55 to 60%. LM: Very large caliber vessel, mildly calcified. LAD: Very large.  Vessel gives origin to a very large D1.  In the midsegment of the LAD, there is mildly calcific 30 to at most 40% stenosis which does not appear to be flow-limiting.  Most views it appears to be 30%. LCx: Very large caliber vessel and codominant with RCA.  Large OM 2.  Moderate-sized PDA branches distally.  No significant disease.  Mild calcification is evident. RCA: Large-caliber vessel, gives origin to a moderate-sized PDA.  There is no significant disease in the RCA.    Impression and recommendations: No significant coronary artery disease by cardiac catheterization.  Evaluate for noncardiac causes of chest pain.  No indication for aspirin .  Findings Coronary Findings Diagnostic  Dominance: Co-dominant  Left Anterior Descending Mid LAD lesion is 30% stenosed.  Intervention  No  interventions  have been documented.     ECHOCARDIOGRAM  ECHOCARDIOGRAM LIMITED 03/27/2024  Narrative ECHOCARDIOGRAM LIMITED REPORT    Patient Name:   Jay Day Date of Exam: 03/27/2024 Medical Rec #:  [id number]       Height:       72.0 in Accession #:    [id number]      Weight:       187.0 lb Date of Birth:  28-Nov-1940        BSA:          2.071 m Patient Age:    83 years        BP:           112/60 mmHg Patient Gender: M               HR:           52 bpm. Exam Location:  [address]  Procedure: Intracardiac Opacification Agent and Limited Echo (Both Spectral and Color Flow Doppler were utilized during procedure).  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 CHF, CAD, Signs/Symptoms:Syncope; Risk Factors:Hypertension and Dyslipidemia. Aortic atherosclerosis. Chronic kidney disease. Dyspnea on exertion.  Sonographer:     Jon Hacker RCS Referring Phys:  [id number] LORETTE CINDERELLA KAPUR Diagnosing Phys: Emeline Calender  IMPRESSIONS   1. Apical aneurysm noted. 2. LV apical thrombus noted. 3. Left ventricular ejection fraction, by estimation, is 60 to 65%. The left ventricle has normal function. Left ventricular diastolic parameters are consistent with Grade I diastolic dysfunction (impaired relaxation). 4. The mitral valve was not assessed. Trivial mitral valve regurgitation. 5. The aortic valve was not assessed. Aortic valve regurgitation is trivial. 6. Aortic dilatation noted. There is mild dilatation of the aortic root, measuring 37 mm. There is mild dilatation of the ascending aorta, measuring 41 mm.  Comparison(s): A prior study was performed on 03/12/2024. There was an apical aneurysm noted but a thrombus was unable to be ruled out. There was mild to moderate MR and mild aortic regurgitation. The ascending aorta was 40 mm. No other  significant changes.  Conclusion(s)/Recommendation(s): Critical findings reported to [employer].  FINDINGS Left Ventricle: LV apical aneurysm noted, best visualized in 2 chamber view on image 13. There is a 0.72 cm x 0.60 cm apical thrombus noted on image 14. Left ventricular ejection fraction, by estimation, is 60 to 65%. The left ventricle has normal function. Definity  contrast agent was given IV to delineate the left ventricular endocardial borders. The left ventricular internal cavity size was normal in size. Left ventricular diastolic parameters are consistent with Grade I diastolic dysfunction (impaired relaxation).  Mitral Valve: The mitral valve was not assessed. Trivial mitral valve regurgitation.  Aortic Valve: The aortic valve was not assessed. Aortic valve regurgitation is trivial.  Aorta: Aortic dilatation noted. There is mild dilatation of the aortic root, measuring 37 mm. There is mild dilatation of the ascending aorta, measuring 41 mm.  LEFT VENTRICLE PLAX 2D LVIDd:         4.36 cm   Diastology LVIDs:         2.87 cm   LV e' medial:    5.98 cm/s LV PW:         1.18 cm   LV E/e' medial:  11.4 LV IVS:        1.04 cm   LV  e' lateral:   6.20 cm/s LVOT diam:     2.20 cm   LV E/e' lateral: 11.0 LV SV:         96 LV SV Index:   46 LVOT Area:     3.80 cm   RIGHT VENTRICLE TAPSE (M-mode): 2.7 cm  LEFT ATRIUM           Index LA diam:      3.30 cm 1.59 cm/m LA Vol (A2C): 38.4 ml 18.55 ml/m AORTIC VALVE LVOT Vmax:   100.00 cm/s LVOT Vmean:  65.000 cm/s LVOT VTI:    0.253 m  AORTA Ao Root diam: 3.70 cm Ao Asc diam:  4.10 cm  MITRAL VALVE MV Area (PHT): 3.21 cm    SHUNTS MV Decel Time: 236 msec    Systemic VTI:  0.25 m MV E velocity: 68.10 cm/s  Systemic Diam: 2.20 cm MV A velocity: 96.40 cm/s MV E/A ratio:  0.71  Emeline Calender Electronically signed by Emeline Calender Signature Date/Time: 03/27/2024/4:18:49 PM    Final (Updated)    MONITORS  LONG TERM  MONITOR (3-14 DAYS) 12/20/2019   CT SCANS  CT CARDIAC SCORING (SELF PAY ONLY) 01/03/2023  Addendum 01/04/2023 12:15 AM ADDENDUM REPORT: 01/04/2023 00:13  EXAM: OVER-READ INTERPRETATION  CT CHEST  The following report is an over-read performed by radiologist Dr. Vanetta Mortimer [facility] on 01/04/2023. This over-read does not include interpretation of cardiac or coronary anatomy or pathology. The coronary calcium  score interpretation by the cardiologist is attached.  COMPARISON:  CT dated 04/19/2021.  FINDINGS: Mild atherosclerotic calcification of the thoracic aorta.  No adenopathy noted. Right hilar and mediastinal calcified granuloma.  Linear atelectasis/scarring in the right middle lobe and lingula. A cluster of micro nodularity in the lateral subpleural right lower lobe (11/4), likely sequela of prior inflammatory process. There is a 4 mm nodule at the right lung base (35/4).  No acute osseous pathology.  IMPRESSION: 1. No acute extracardiac findings. 2. A 4 mm right lower lobe nodule. No follow-up needed if patient is low-risk.This recommendation follows the consensus statement: Guidelines for Management of Incidental Pulmonary Nodules Detected on CT Images: From the Fleischner Society 2017; Radiology 2017; 284:228-243. 3.  Aortic Atherosclerosis (ICD10-I70.0).   Electronically Signed By: Vanetta Chou M.D. On: 01/04/2023 00:13  Narrative CLINICAL DATA:  Cardiovascular Disease Risk stratification  EXAM: Coronary Calcium  Score  TECHNIQUE: A gated, non-contrast computed tomography scan of the heart was performed using 3mm slice thickness. Axial images were analyzed on a dedicated workstation. Calcium  scoring of the coronary arteries was performed using the Agatston method.  FINDINGS: Coronary arteries: Normal origins.  Coronary Calcium  Score:  Left main: 72  Left anterior descending artery: 347  Left circumflex artery: 0  Right  coronary artery: 0  Total: 419  Percentile: 51  Pericardium: Normal.  Aorta: Mildly dilated caliber of ascending aorta (40 mm). Aortic atherosclerosis noted.  Non-cardiac: See separate report from [facility].  IMPRESSION: Coronary calcium  score of 419. This was 51st percentile for age-, race-, and sex-matched controls. Mildly dilated caliber of ascending aorta (40 mm). Aortic atherosclerosis noted.  RECOMMENDATIONS: Coronary artery calcium  (CAC) score is a strong predictor of incident coronary heart disease (CHD) and provides predictive information beyond traditional risk factors. CAC scoring is reasonable to use in the decision to withhold, postpone, or initiate statin therapy in intermediate-risk or selected borderline-risk asymptomatic adults (age 99-75 years and LDL-C >=70 to <190 mg/dL) who do not have diabetes or established atherosclerotic  cardiovascular disease (ASCVD).* In intermediate-risk (10-year ASCVD risk >=7.5% to <20%) adults or selected borderline-risk (10-year ASCVD risk >=5% to <7.5%) adults in whom a CAC score is measured for the purpose of making a treatment decision the following recommendations have been made:  If CAC=0, it is reasonable to withhold statin therapy and reassess in 5 to 10 years, as long as higher risk conditions are absent (diabetes mellitus, family history of premature CHD in first degree relatives (males <55 years; females <65 years), cigarette smoking, or LDL >=190 mg/dL).  If CAC is 1 to 99, it is reasonable to initiate statin therapy for patients >=75 years of age.  If CAC is >=100 or >=75th percentile, it is reasonable to initiate statin therapy at any age.  Cardiology referral should be considered for patients with CAC scores >=400 or >=75th percentile.  *2018 AHA/ACC/AACVPR/AAPA/ABC/ACPM/ADA/AGS/APhA/ASPC/NLA/PCNA Guideline on the Management of Blood Cholesterol: A Report of the American College of  Cardiology/American Heart Association Task Force on Clinical Practice Guidelines. J Am Coll Cardiol. 2019;73(24):3168-3209.  Shelda Bruckner, MD  Electronically Signed: By: Shelda Bruckner M.D. On: 01/03/2023 19:51   CT SCANS  CT CORONARY FRACTIONAL FLOW RESERVE DATA PREP 04/19/2021  Narrative EXAM: FFRCT ANALYSIS  FINDINGS: FFRct analysis was performed on the original cardiac CT angiogram dataset. Diagrammatic representation of the FFRct analysis is provided in a separate PDF document in PACS. This dictation was created using the PDF document and an interactive 3D model of the results. 3D model is not available in the EMR/PACS. Normal FFR range is >0.80.  1. Left Main: No significant lesions  2. LAD: CTFFR 0.9 across lesion in proximal to mid LAD, suggesting no functional significance  3. LCX: CTFFR 0.9 across lesion in mid LCX, suggesting no functional significance  4.    RCA: No significant lesions  IMPRESSION: 1.  No functionally significant coronary stenosis by CTFFR   Electronically Signed By: Bruckner Nanas M.D. On: 04/20/2021 10:18   CT CORONARY MORPH W/CTA COR W/SCORE 04/19/2021  Addendum 04/19/2021  2:38 PM ADDENDUM REPORT: 04/19/2021 14:36  CLINICAL DATA:  35M with history of NICM, recurrent syncope  EXAM: Cardiac/Coronary CTA  TECHNIQUE: The patient was scanned on a [employer].  FINDINGS: A 100 kV prospective scan was triggered in the descending thoracic aorta at 111 HU's. Axial non-contrast 3 mm slices were carried out through the heart. The data set was analyzed on a dedicated work station and scored using the Agatson method. Gantry rotation speed was 250 msecs and collimation was .6 mm. 0.8 mg of sl NTG was given. The 3D data set was reconstructed in 5% intervals of the 35-75 % of the R-R cycle. Phases were analyzed on a dedicated work station using MPR, MIP and VRT modes. The patient received 80 cc  of contrast.  Coronary Arteries:  Normal coronary origin.  Right dominance.  RCA is a large dominant artery that gives rise to PDA and PLA. There is noncalcified plaque causing 0-24% ostial stenosis  Left main is a large artery that gives rise to LAD and LCX arteries. SHort left main  LAD is a large vessel that has no plaque. Mixed plaque in proximal LAD causes 25-49% stenosis. Calcified plaque in mid LAD causes 25-49% stenosis. 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 There is no plaque.  Other findings:  Left Ventricle: Normal size  Left Atrium: Mild enlargement  Pulmonary Veins: Normal configuration  Right Ventricle: Moderate dilatation  Right Atrium: Mild enlargement  Cardiac valves: No calcifications  Thoracic aorta: Normal size  Pulmonary Arteries: Normal size  Systemic Veins: Normal drainage  Pericardium: Normal thickness  IMPRESSION: 1. Coronary calcium  score of 400. This was 54th percentile for age and sex matched control.  2. Normal coronary origin with right dominance.  3. 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 Calcified plaque in mid LAD causes mild (25-49%) stenosis.  4.  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 LAD stenosis appears mild, but given location in proximal LAD and considering LV apical aneurysm, will send for CTFFR to exclude obstructive CAD  CAD-RADS 2. Mild non-obstructive CAD (25-49%). Consider non-atherosclerotic causes of chest pain. Consider preventive therapy and risk factor modification.   Electronically Signed By: Lonni Nanas M.D. On: 04/19/2021 14:36  Narrative EXAM: OVER-READ INTERPRETATION  CT CHEST  The following report is an over-read performed by radiologist Dr. Toribio Aye of [facility] on 04/19/2021. This over-read does not include interpretation  of cardiac or coronary anatomy or pathology. The coronary calcium  score/coronary CTA interpretation by the cardiologist is attached.  COMPARISON:  None.  FINDINGS: Atherosclerotic calcifications in the thoracic aorta. Focal area of cylindrical bronchiectasis, thickening of the peribronchovascular interstitium, regional architectural distortion and volume loss, and peribronchovascular micro nodularity in the right upper lobe, most compatible with an area of chronic post infectious or inflammatory scarring with areas of chronic mucoid impaction. Several other scattered linear areas of architectural distortion are noted elsewhere in the lungs, also likely chronic areas of scarring. No other larger more suspicious appearing pulmonary nodules or masses are noted. Within the visualized portions of the thorax there is no acute consolidative airspace disease, no pleural effusions, no pneumothorax and no lymphadenopathy. Visualized portions of the upper abdomen demonstrate innumerable calcified granulomas throughout the liver and spleen. There are no aggressive appearing lytic or blastic lesions noted in the visualized portions of the skeleton.  IMPRESSION: 1. Scattered areas of post infectious scarring in the lungs bilaterally, as above. 2. Old granulomatous disease, as above.  Electronically Signed: By: Toribio Aye M.D. On: 04/19/2021 11:44   CARDIAC MRI  MR CARDIAC MORPHOLOGY W WO CONTRAST 04/12/2024  Narrative CLINICAL DATA:  71M with nonobstructive CAD, NICM with EF 45-50% in 2015 with subsequent normalization, PAF on Eliquis , LV apical aneurysm with thrombus, CVA, recurrent PE/DVT. Evaluate LV apical aneurysm/thrombus  EXAM: CARDIAC MRI  TECHNIQUE: The patient was scanned on a 1.5 Tesla Siemens magnet. A dedicated cardiac coil was used. Functional imaging was done using Fiest[employer] sequences. 2,3, and 4 chamber views were done to assess for RWMA's. Modified Simpson's  rule using a short axis stack was used to calculate an ejection fraction on a dedicated work [employer]. The patient received 11 cc of Gadavist . After 10 minutes inversion recovery sequences were used to assess for infiltration and scar tissue. Phase contrast velocity mapping was performed above the aortic and pulmonic valves  CONTRAST:  11 cc  of Gadavist   FINDINGS: Left ventricle:  -Apical aneurysm with maximum diameter 2.8cm and depth 1.5cm, measured in end diastole on 2 chamber long axis view  -No apical thrombus  -Normal size  -Mild asymmetric hypertrophy, measuring up to 13mm in basal septum (7mm in posterior wall)  -Normal systolic function  -Normal ECV (25%)  -Normal T2 values  -Transmural LGE at apex. Subendocardial LGE in focal region of basal inferolateral wall.  LV EF:  57% (Normal 49-79%)  Absolute volumes:  LV EDV: (Normal 95-215 mL)  LV ESV: 71mL (Normal 25-85 mL)  LV SV: 95mL (Normal 61-145 mL)  CO: 5.1L/min (Normal 3.4-7.8 L/min)  Indexed volumes:  LV EDV: 32mL/sq-m (Normal 50-108 mL/sq-m)  LV ESV: 65mL/sq-m (Normal 11-47 mL/sq-m)  LV SV: 71mL/sq-m (Normal 33-72 mL/sq-m)  CI: 2.5L/min/sq-m (Normal 1.8-4.2 L/min/sq-m)  Right ventricle: Normal size and systolic function  RV EF:  51% (Normal 51-80%)  Absolute volumes:  RV EDV: (Normal 109-217 mL)  RV ESV: 90mL (Normal 23-91 mL)  RV SV: 92mL (Normal 71-141 mL)  CO: 5.0L/min (Normal 2.8-8.8 L/min)  Indexed volumes:  RV EDV: 15mL/sq-m (Normal 58-109 mL/sq-m)  RV ESV: 51mL/sq-m (Normal 12-46 mL/sq-m)  RV SV: 67mL/sq-m (Normal 38-71 mL/sq-m)  CI: 2.4L/min/sq-m (Normal 1.7-4.2 L/min/sq-m)  Left atrium: Mild enlargement  Right atrium: Mild enlargement  Mitral valve: Moderate regurgitation (regurgitant fraction 26%)  Aortic valve: Tricuspid.  Trivial regurgitation  Tricuspid valve: Mild regurgitation (regurgitant fraction 14%)  Pulmonic valve:  Trivial regurgitation  Aorta: Normal proximal ascending aorta  Pericardium: Normal  IMPRESSION: 1. 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 No LV apical thrombus seen  2. Subendocardial LGE in focal region of basal inferolateral wall, as well as transmural LGE at apex. While subendocardial LGE is typically seen in ischemic cardiomyopathies, patient had no significant CAD on recent left heart catheterization. Findings could represent embolic events causing infarcts at basal inferolateral wall and apex. Would also consider sarcoidosis on the differential, as can cause subendocardial LGE in non-coronary distribution, and patient noted to have calcified granulomas in mediastinal lymph nodes, liver, and spleen on CT. Recommend FDG-PET to evaluate for sarcoidosis.  3. Mild asymmetric LV hypertrophy, measuring up to 13mm in basal septum (7mm in posterior wall), not meeting criteria for hypertrophic cardiomyopathy (<85mm)  4. Normal LV size and global systolic function (EF 57%), with apical aneurysm as above  5.  Normal RV size and systolic function (EF 51%)  6.  Moderate mitral regurgitation (regurgitant fraction 26%)   Electronically Signed By: Lonni Nanas M.D. On: 04/15/2024 12:28   ______________________________________________________________________________________________      EKG: None today  Physical Exam: BP 104/68   Pulse (!) 55   Ht 6' (1.829 m)   Wt 185 lb 12.8 oz (84.3 kg)   SpO2 97%   BMI 25.20 kg/m   GEN: Well nourished, well developed, NAD NECK: No JVD CARDIAC: bradycardic, RRR, no murmurs, rubs, gallops RESPIRATORY:  Clear to auscultation ABDOMEN: Soft, non-tender, non-distended EXTREMITIES:  No edema  Assessment & Plan: 1. CAD: Cath 12/14/23 with non-obstructive CAD. Denies symptoms. Does not like to take medications, but agrees to statin three times per week. Not fasting today. - Check lipids and CMP in the next  week once fasting - Continue rosuvastatin  5 mg MWF - Unable to tolerate BB in the past with bradycardia  2. Cardiac sarcoidosis: Noted possibility of this on cardiac MRI as above. Long discussion about this potential diagnosis. Questions answered. - Cardiac PET pending - scheduled for 06/27/24  3. Hyperlipidemia: 10/04/22 LDL 55. 10/17/23 AST 33, ALT 18. - Check lipids and CMP as above - Continue rosuvastatin  5 mg MWF  4. PAF: Regular, bradycardic rhythm by auscultation today. - Check CBC with other labs as above - Continue Eliquis  5 mg BID  5. Hypertension: BP today 104/68. Denies dizziness, syncope. - Medications not tolerated in the past with syncopal episodes - Continue lifestyle modifications and monitor BP at  home    Dispo: Follow-up with Dr. Jeffrie or APP in 2-3 months after cardiac PET test.  Signed, Saddie GORMAN Cleaves, NP 05/21/2024 10:36 AM [facility]

## 2024-05-19 NOTE — Progress Notes (Deleted)
 Cardiology Office Note   Date:  05/14/2024  ID:  Jay Day 10/25/1988, MRN [id number] PCP: Jesus Bernardino MATSU, MD  [facility] Providers Cardiologist: [name], MD { Click to update primary MD,subspecialty MD or APP then REFRESH:1}    Chief Complaint: Jay Day presents to the clinic for ***     ***     History of Present Illness: Jay Day is a 83 y.o.male with PMH of *** who presents to the clinic for ***  ROS: Please see the history of present illness. All other systems reviewed and are negative.   Studies Reviewed: The following studies were personally reviewed today ***: Cardiac Studies & Procedures   ______________________________________________________________________________________________ CARDIAC CATHETERIZATION  CARDIAC CATHETERIZATION 12/14/2023  Conclusion Images from the original result were not included. Cardiac Catheterization 12/14/23: Hemodynamic data: LV 131/8, EDP 12 mmHg.  Ao 118/69, mean 89 mmHg.  There is no pressure gradient across the aortic valve.  Angiographic data: LV: Normal LVEF, no regional wall motion normality.  EF 55 to 60%. LM: Very large caliber vessel, mildly calcified. LAD: Very large.  Vessel gives origin to a very large D1.  In the midsegment of the LAD, there is mildly calcific 30 to at most 40% stenosis which does not appear to be flow-limiting.  Most views it appears to be 30%. LCx: Very large caliber vessel and codominant with RCA.  Large OM 2.  Moderate-sized PDA branches distally.  No significant disease.  Mild calcification is evident. RCA: Large-caliber vessel, gives origin to a moderate-sized PDA.  There is no significant disease in the RCA.    Impression and recommendations: No significant coronary artery disease by cardiac catheterization.  Evaluate for noncardiac causes of chest pain.  No indication for aspirin .  Findings Coronary Findings Diagnostic  Dominance: Co-dominant  Left  Anterior Descending Mid LAD lesion is 30% stenosed.  Intervention  No interventions have been documented.     ECHOCARDIOGRAM  ECHOCARDIOGRAM LIMITED 03/27/2024  Narrative ECHOCARDIOGRAM LIMITED REPORT    Patient Name:   Jay Day Date of Exam: 03/27/2024 Medical Rec #:  [id number]       Height:       72.0 in Accession #:    [id number]      Weight:       187.0 lb Date of Birth:  1941-03-14        BSA:          2.071 m Patient Age:    83 years        BP:           112/60 mmHg Patient Gender: M               HR:           52 bpm. Exam Location:  [address]  Procedure: Intracardiac Opacification Agent and Limited Echo (Both Spectral and Color Flow Doppler were utilized during procedure).  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 CHF, CAD, Signs/Symptoms:Syncope; Risk Factors:Hypertension and Dyslipidemia. Aortic atherosclerosis. Chronic kidney disease. Dyspnea on exertion.  Sonographer:     Jon Hacker RCS Referring Phys:  [id number] LORETTE CINDERELLA KAPUR Diagnosing Phys: Emeline Calender  IMPRESSIONS   1. Apical aneurysm noted. 2. LV apical thrombus noted. 3. Left ventricular ejection fraction, by estimation, is 60 to 65%. The left ventricle has normal  function. Left ventricular diastolic parameters are consistent with Grade I diastolic dysfunction (impaired relaxation). 4. The mitral valve was not assessed. Trivial mitral valve regurgitation. 5. The aortic valve was not assessed. Aortic valve regurgitation is trivial. 6. Aortic dilatation noted. There is mild dilatation of the aortic root, measuring 37 mm. There is mild dilatation of the ascending aorta, measuring 41 mm.  Comparison(s): A prior study was performed on 03/12/2024. There was an apical aneurysm noted but a thrombus was unable to be ruled out. There was mild to moderate MR and  mild aortic regurgitation. The ascending aorta was 40 mm. No other significant changes.  Conclusion(s)/Recommendation(s): Critical findings reported to [employer].  FINDINGS Left Ventricle: LV apical aneurysm noted, best visualized in 2 chamber view on image 13. There is a 0.72 cm x 0.60 cm apical thrombus noted on image 14. Left ventricular ejection fraction, by estimation, is 60 to 65%. The left ventricle has normal function. Definity  contrast agent was given IV to delineate the left ventricular endocardial borders. The left ventricular internal cavity size was normal in size. Left ventricular diastolic parameters are consistent with Grade I diastolic dysfunction (impaired relaxation).  Mitral Valve: The mitral valve was not assessed. Trivial mitral valve regurgitation.  Aortic Valve: The aortic valve was not assessed. Aortic valve regurgitation is trivial.  Aorta: Aortic dilatation noted. There is mild dilatation of the aortic root, measuring 37 mm. There is mild dilatation of the ascending aorta, measuring 41 mm.  LEFT VENTRICLE PLAX 2D LVIDd:         4.36 cm   Diastology LVIDs:         2.87 cm   LV e' medial:    5.98 cm/s LV PW:         1.18 cm   LV E/e' medial:  11.4 LV IVS:        1.04 cm   LV e' lateral:   6.20 cm/s LVOT diam:     2.20 cm   LV E/e' lateral: 11.0 LV SV:         96 LV SV Index:   46 LVOT Area:     3.80 cm   RIGHT VENTRICLE TAPSE (M-mode): 2.7 cm  LEFT ATRIUM           Index LA diam:      3.30 cm 1.59 cm/m LA Vol (A2C): 38.4 ml 18.55 ml/m AORTIC VALVE LVOT Vmax:   100.00 cm/s LVOT Vmean:  65.000 cm/s LVOT VTI:    0.253 m  AORTA Ao Root diam: 3.70 cm Ao Asc diam:  4.10 cm  MITRAL VALVE MV Area (PHT): 3.21 cm    SHUNTS MV Decel Time: 236 msec    Systemic VTI:  0.25 m MV E velocity: 68.10 cm/s  Systemic Diam: 2.20 cm MV A velocity: 96.40 cm/s MV E/A ratio:  0.71  Emeline Calender Electronically signed by Emeline Calender Signature Date/Time:  03/27/2024/4:18:49 PM    Final (Updated)    MONITORS  LONG TERM MONITOR (3-14 DAYS) 12/20/2019   CT SCANS  CT CARDIAC SCORING (SELF PAY ONLY) 01/03/2023  Addendum 01/04/2023 12:15 AM ADDENDUM REPORT: 01/04/2023 00:13  EXAM: OVER-READ INTERPRETATION  CT CHEST  The following report is an over-read performed by radiologist Dr. Vanetta Mortimer [facility] on 01/04/2023. This over-read does not include interpretation of cardiac or coronary anatomy or pathology. The coronary calcium  score interpretation by the cardiologist is attached.  COMPARISON:  CT dated 04/19/2021.  FINDINGS: Mild atherosclerotic calcification of the  thoracic aorta.  No adenopathy noted. Right hilar and mediastinal calcified granuloma.  Linear atelectasis/scarring in the right middle lobe and lingula. A cluster of micro nodularity in the lateral subpleural right lower lobe (11/4), likely sequela of prior inflammatory process. There is a 4 mm nodule at the right lung base (35/4).  No acute osseous pathology.  IMPRESSION: 1. No acute extracardiac findings. 2. A 4 mm right lower lobe nodule. No follow-up needed if patient is low-risk.This recommendation follows the consensus statement: Guidelines for Management of Incidental Pulmonary Nodules Detected on CT Images: From the Fleischner Society 2017; Radiology 2017; 284:228-243. 3.  Aortic Atherosclerosis (ICD10-I70.0).   Electronically Signed By: Vanetta Chou M.D. On: 01/04/2023 00:13  Narrative CLINICAL DATA:  Cardiovascular Disease Risk stratification  EXAM: Coronary Calcium  Score  TECHNIQUE: A gated, non-contrast computed tomography scan of the heart was performed using 3mm slice thickness. Axial images were analyzed on a dedicated workstation. Calcium  scoring of the coronary arteries was performed using the Agatston method.  FINDINGS: Coronary arteries: Normal origins.  Coronary Calcium  Score:  Left main:  72  Left anterior descending artery: 347  Left circumflex artery: 0  Right coronary artery: 0  Total: 419  Percentile: 51  Pericardium: Normal.  Aorta: Mildly dilated caliber of ascending aorta (40 mm). Aortic atherosclerosis noted.  Non-cardiac: See separate report from [facility].  IMPRESSION: Coronary calcium  score of 419. This was 51st percentile for age-, race-, and sex-matched controls. Mildly dilated caliber of ascending aorta (40 mm). Aortic atherosclerosis noted.  RECOMMENDATIONS: Coronary artery calcium  (CAC) score is a strong predictor of incident coronary heart disease (CHD) and provides predictive information beyond traditional risk factors. CAC scoring is reasonable to use in the decision to withhold, postpone, or initiate statin therapy in intermediate-risk or selected borderline-risk asymptomatic adults (age 32-75 years and LDL-C >=70 to <190 mg/dL) who do not have diabetes or established atherosclerotic cardiovascular disease (ASCVD).* In intermediate-risk (10-year ASCVD risk >=7.5% to <20%) adults or selected borderline-risk (10-year ASCVD risk >=5% to <7.5%) adults in whom a CAC score is measured for the purpose of making a treatment decision the following recommendations have been made:  If CAC=0, it is reasonable to withhold statin therapy and reassess in 5 to 10 years, as long as higher risk conditions are absent (diabetes mellitus, family history of premature CHD in first degree relatives (males <55 years; females <65 years), cigarette smoking, or LDL >=190 mg/dL).  If CAC is 1 to 99, it is reasonable to initiate statin therapy for patients >=9 years of age.  If CAC is >=100 or >=75th percentile, it is reasonable to initiate statin therapy at any age.  Cardiology referral should be considered for patients with CAC scores >=400 or >=75th percentile.  *2018 AHA/ACC/AACVPR/AAPA/ABC/ACPM/ADA/AGS/APhA/ASPC/NLA/PCNA Guideline on the  Management of Blood Cholesterol: A Report of the American College of Cardiology/American Heart Association Task Force on Clinical Practice Guidelines. J Am Coll Cardiol. 2019;73(24):3168-3209.  Shelda Bruckner, MD  Electronically Signed: By: Shelda Bruckner M.D. On: 01/03/2023 19:51   CT SCANS  CT CORONARY FRACTIONAL FLOW RESERVE DATA PREP 04/19/2021  Narrative EXAM: FFRCT ANALYSIS  FINDINGS: FFRct analysis was performed on the original cardiac CT angiogram dataset. Diagrammatic representation of the FFRct analysis is provided in a separate PDF document in PACS. This dictation was created using the PDF document and an interactive 3D model of the results. 3D model is not available in the EMR/PACS. Normal FFR range is >0.80.  1. Left Main: No significant lesions  2. LAD: CTFFR 0.9 across lesion in proximal to mid LAD, suggesting no functional significance  3. LCX: CTFFR 0.9 across lesion in mid LCX, suggesting no functional significance  4.    RCA: No significant lesions  IMPRESSION: 1.  No functionally significant coronary stenosis by CTFFR   Electronically Signed By: Lonni Nanas M.D. On: 04/20/2021 10:18   CT CORONARY MORPH W/CTA COR W/SCORE 04/19/2021  Addendum 04/19/2021  2:38 PM ADDENDUM REPORT: 04/19/2021 14:36  CLINICAL DATA:  22M with history of NICM, recurrent syncope  EXAM: Cardiac/Coronary CTA  TECHNIQUE: The patient was scanned on a [employer].  FINDINGS: A 100 kV prospective scan was triggered in the descending thoracic aorta at 111 HU's. Axial non-contrast 3 mm slices were carried out through the heart. The data set was analyzed on a dedicated work station and scored using the Agatson method. Gantry rotation speed was 250 msecs and collimation was .6 mm. 0.8 mg of sl NTG was given. The 3D data set was reconstructed in 5% intervals of the 35-75 % of the R-R cycle. Phases were analyzed on a dedicated work  station using MPR, MIP and VRT modes. The patient received 80 cc of contrast.  Coronary Arteries:  Normal coronary origin.  Right dominance.  RCA is a large dominant artery that gives rise to PDA and PLA. There is noncalcified plaque causing 0-24% ostial stenosis  Left main is a large artery that gives rise to LAD and LCX arteries. SHort left main  LAD is a large vessel that has no plaque. Mixed plaque in proximal LAD causes 25-49% stenosis. Calcified plaque in mid LAD causes 25-49% stenosis. 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 There is no plaque.  Other findings:  Left Ventricle: Normal size  Left Atrium: Mild enlargement  Pulmonary Veins: Normal configuration  Right Ventricle: Moderate dilatation  Right Atrium: Mild enlargement  Cardiac valves: No calcifications  Thoracic aorta: Normal size  Pulmonary Arteries: Normal size  Systemic Veins: Normal drainage  Pericardium: Normal thickness  IMPRESSION: 1. Coronary calcium  score of 400. This was 54th percentile for age and sex matched control.  2. Normal coronary origin with right dominance.  3. 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 Calcified plaque in mid LAD causes mild (25-49%) stenosis.  4.  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 LAD stenosis appears mild, but given location in proximal LAD and considering LV apical aneurysm, will send for CTFFR to exclude obstructive CAD  CAD-RADS 2. Mild non-obstructive CAD (25-49%). Consider non-atherosclerotic causes of chest pain. Consider preventive therapy and risk factor modification.   Electronically Signed By: Lonni Nanas M.D. On: 04/19/2021 14:36  Narrative EXAM: OVER-READ INTERPRETATION  CT CHEST  The following report is an over-read performed by radiologist Dr. Toribio Aye of [facility] on 04/19/2021. This over-read does not include interpretation of cardiac or coronary anatomy or pathology. The coronary calcium  score/coronary CTA interpretation by the cardiologist is attached.  COMPARISON:  None.  FINDINGS: Atherosclerotic calcifications in the thoracic aorta. Focal area of cylindrical bronchiectasis, thickening of the peribronchovascular interstitium, regional architectural distortion and volume loss, and peribronchovascular micro nodularity in the right upper lobe, most compatible with an area of chronic post infectious or inflammatory scarring with areas of chronic mucoid impaction. Several other scattered linear areas of architectural distortion are noted elsewhere in the lungs, also likely chronic  areas of scarring. No other larger more suspicious appearing pulmonary nodules or masses are noted. Within the visualized portions of the thorax there is no acute consolidative airspace disease, no pleural effusions, no pneumothorax and no lymphadenopathy. Visualized portions of the upper abdomen demonstrate innumerable calcified granulomas throughout the liver and spleen. There are no aggressive appearing lytic or blastic lesions noted in the visualized portions of the skeleton.  IMPRESSION: 1. Scattered areas of post infectious scarring in the lungs bilaterally, as above. 2. Old granulomatous disease, as above.  Electronically Signed: By: Toribio Aye M.D. On: 04/19/2021 11:44   CARDIAC MRI  MR CARDIAC MORPHOLOGY W WO CONTRAST 04/12/2024  Narrative CLINICAL DATA:  26M with nonobstructive CAD, NICM with EF 45-50% in 2015 with subsequent normalization, PAF on Eliquis , LV apical aneurysm with thrombus, CVA, recurrent PE/DVT. Evaluate LV apical aneurysm/thrombus  EXAM: CARDIAC MRI  TECHNIQUE: The patient was scanned on a 1.5 Tesla Siemens magnet. A dedicated cardiac coil was used. Functional imaging was done using [employer] sequences. 2,3, and 4  chamber views were done to assess for RWMA's. Modified Simpson's rule using a short axis stack was used to calculate an ejection fraction on a dedicated work [employer]. The patient received 11 cc of Gadavist . After 10 minutes inversion recovery sequences were used to assess for infiltration and scar tissue. Phase contrast velocity mapping was performed above the aortic and pulmonic valves  CONTRAST:  11 cc  of Gadavist   FINDINGS: Left ventricle:  -Apical aneurysm with maximum diameter 2.8cm and depth 1.5cm, measured in end diastole on 2 chamber long axis view  -No apical thrombus  -Normal size  -Mild asymmetric hypertrophy, measuring up to 13mm in basal septum (7mm in posterior wall)  -Normal systolic function  -Normal ECV (25%)  -Normal T2 values  -Transmural LGE at apex. Subendocardial LGE in focal region of basal inferolateral wall.  LV EF: 57% (Normal 49-79%)  Absolute volumes:  LV EDV: (Normal 95-215 mL)  LV ESV: 71mL (Normal 25-85 mL)  LV SV: 95mL (Normal 61-145 mL)  CO: 5.1L/min (Normal 3.4-7.8 L/min)  Indexed volumes:  LV EDV: 54mL/sq-m (Normal 50-108 mL/sq-m)  LV ESV: 42mL/sq-m (Normal 11-47 mL/sq-m)  LV SV: 62mL/sq-m (Normal 33-72 mL/sq-m)  CI: 2.5L/min/sq-m (Normal 1.8-4.2 L/min/sq-m)  Right ventricle: Normal size and systolic function  RV EF:  51% (Normal 51-80%)  Absolute volumes:  RV EDV: (Normal 109-217 mL)  RV ESV: 90mL (Normal 23-91 mL)  RV SV: 92mL (Normal 71-141 mL)  CO: 5.0L/min (Normal 2.8-8.8 L/min)  Indexed volumes:  RV EDV: 18mL/sq-m (Normal 58-109 mL/sq-m)  RV ESV: 28mL/sq-m (Normal 12-46 mL/sq-m)  RV SV: 56mL/sq-m (Normal 38-71 mL/sq-m)  CI: 2.4L/min/sq-m (Normal 1.7-4.2 L/min/sq-m)  Left atrium: Mild enlargement  Right atrium: Mild enlargement  Mitral valve: Moderate regurgitation (regurgitant fraction 26%)  Aortic valve: Tricuspid.  Trivial regurgitation  Tricuspid valve:  Mild regurgitation (regurgitant fraction 14%)  Pulmonic valve: Trivial regurgitation  Aorta: Normal proximal ascending aorta  Pericardium: Normal  IMPRESSION: 1. 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 No LV apical thrombus seen  2. Subendocardial LGE in focal region of basal inferolateral wall, as well as transmural LGE at apex. While subendocardial LGE is typically seen in ischemic cardiomyopathies, patient had no significant CAD on recent left heart catheterization. Findings could represent embolic events causing infarcts at basal inferolateral wall and apex. Would also consider sarcoidosis on the differential, as can cause subendocardial LGE in non-coronary distribution, and patient noted to  have calcified granulomas in mediastinal lymph nodes, liver, and spleen on CT. Recommend FDG-PET to evaluate for sarcoidosis.  3. Mild asymmetric LV hypertrophy, measuring up to 13mm in basal septum (7mm in posterior wall), not meeting criteria for hypertrophic cardiomyopathy (<68mm)  4. Normal LV size and global systolic function (EF 57%), with apical aneurysm as above  5.  Normal RV size and systolic function (EF 51%)  6.  Moderate mitral regurgitation (regurgitant fraction 26%)   Electronically Signed By: Lonni Nanas M.D. On: 04/15/2024 12:28   ______________________________________________________________________________________________       Risk Assessment/Calculations: {Does this patient have ATRIAL FIBRILLATION?[phone]} No BP recorded.  {Refresh Note OR Click here to enter BP  :1}***       Physical Exam: There were no vitals taken for this visit. There were no vitals filed for this visit.   GEN: *** Well nourished, well developed in no acute distress NECK: No JVD; No carotid bruits CARDIAC: ***RRR, no murmurs, rubs, gallops RESPIRATORY:  Clear to auscultation without rales, wheezing or rhonchi  ABDOMEN: Soft, non-tender,  non-distended EXTREMITIES:  No edema; No deformity   Assessment & Plan: ***    {Are you ordering a CV Procedure (e.g. stress test, cath, DCCV, TEE, etc)?   Press F2        :[id number]}  Dispo: ***  Signed, Saddie GORMAN Cleaves, NP 05/19/2024 4:23 PM [facility]

## 2024-05-20 ENCOUNTER — Other Ambulatory Visit: Payer: Self-pay

## 2024-05-20 DIAGNOSIS — D8685 Sarcoid myocarditis: Secondary | ICD-10-CM

## 2024-05-21 ENCOUNTER — Ambulatory Visit: Attending: General Practice

## 2024-05-21 VITALS — BP 104/68 | HR 55 | Ht 72.0 in | Wt 185.8 lb

## 2024-05-21 DIAGNOSIS — I480 Paroxysmal atrial fibrillation: Secondary | ICD-10-CM | POA: Diagnosis not present

## 2024-05-21 DIAGNOSIS — E782 Mixed hyperlipidemia: Secondary | ICD-10-CM

## 2024-05-21 DIAGNOSIS — I10 Essential (primary) hypertension: Secondary | ICD-10-CM | POA: Diagnosis not present

## 2024-05-21 DIAGNOSIS — I2510 Atherosclerotic heart disease of native coronary artery without angina pectoris: Secondary | ICD-10-CM

## 2024-05-21 NOTE — Patient Instructions (Signed)
 Medication Instructions:  Your physician recommends that you continue on your current medications as directed. Please refer to the Current Medication list given to you today.  *If you need a refill on your cardiac medications before your next appointment, please call your pharmacy*  Lab Work: In 1 week Lipid Panel, CBC, CMP If you have labs (blood work) drawn today and your tests are completely normal, you will receive your results only by: MyChart Message (if you have MyChart) OR A paper copy in the mail If you have any lab test that is abnormal or we need to change your treatment, we will call you to review the results. Testing/Procedures: None   Follow-Up: At [facility], you and your health needs are our priority.  As part of our continuing mission to provide you with exceptional heart care, our providers are all part of one team.  This team includes your primary Cardiologist (physician) and Advanced Practice Providers or APPs (Physician Assistants and Nurse Practitioners) who all work together to provide you with the care you need, when you need it.  Your next appointment:    February 2026  Provider:   Josefa Beauvais, NP or Saddie Cleaves, NP We recommend signing up for the patient portal called MyChart.  Sign up information is provided on this After Visit Summary.  MyChart is used to connect with patients for Virtual Visits (Telemedicine).  Patients are able to view lab/test results, encounter notes, upcoming appointments, etc.  Non-urgent messages can be sent to your provider as well.   To learn more about what you can do with MyChart, go to [email or web address].   Other Instructions None

## 2024-05-22 NOTE — Telephone Encounter (Signed)
 Cardiac PET scheduled for 06/27/24.

## 2024-05-31 LAB — LIPID PANEL
Chol/HDL Ratio: 2.4 ratio (ref 0.0–5.0)
Cholesterol, Total: 123 mg/dL (ref 100–199)
HDL: 52 mg/dL
LDL Chol Calc (NIH): 50 mg/dL (ref 0–99)
Triglycerides: 116 mg/dL (ref 0–149)
VLDL Cholesterol Cal: 21 mg/dL (ref 5–40)

## 2024-05-31 LAB — COMPREHENSIVE METABOLIC PANEL WITH GFR
ALT: 19 IU/L (ref 0–44)
AST: 31 IU/L (ref 0–40)
Albumin: 4.3 g/dL (ref 3.7–4.7)
Alkaline Phosphatase: 83 IU/L (ref 48–129)
BUN/Creatinine Ratio: 18 (ref 10–24)
BUN: 23 mg/dL (ref 8–27)
Bilirubin Total: 0.7 mg/dL (ref 0.0–1.2)
CO2: 26 mmol/L (ref 20–29)
Calcium: 9.5 mg/dL (ref 8.6–10.2)
Chloride: 105 mmol/L (ref 96–106)
Creatinine, Ser: 1.29 mg/dL — ABNORMAL HIGH (ref 0.76–1.27)
Globulin, Total: 2 g/dL (ref 1.5–4.5)
Glucose: 76 mg/dL (ref 70–99)
Potassium: 4.4 mmol/L (ref 3.5–5.2)
Sodium: 144 mmol/L (ref 134–144)
Total Protein: 6.3 g/dL (ref 6.0–8.5)
eGFR: 56 mL/min/1.73 — ABNORMAL LOW

## 2024-05-31 LAB — CBC
Hematocrit: 43.3 % (ref 37.5–51.0)
Hemoglobin: 15.4 g/dL (ref 13.0–17.7)
MCH: 38 pg — ABNORMAL HIGH (ref 26.6–33.0)
MCHC: 35.6 g/dL (ref 31.5–35.7)
MCV: 107 fL — ABNORMAL HIGH (ref 79–97)
Platelets: 131 x10E3/uL — ABNORMAL LOW (ref 150–450)
RBC: 4.05 x10E6/uL — ABNORMAL LOW (ref 4.14–5.80)
RDW: 11.8 % (ref 11.6–15.4)
WBC: 7 x10E3/uL (ref 3.4–10.8)

## 2024-06-01 ENCOUNTER — Ambulatory Visit: Payer: Self-pay

## 2024-06-11 ENCOUNTER — Encounter (HOSPITAL_COMMUNITY): Payer: Self-pay | Admitting: *Deleted

## 2024-06-25 ENCOUNTER — Telehealth (HOSPITAL_COMMUNITY): Payer: Self-pay | Admitting: Emergency Medicine

## 2024-06-25 NOTE — Telephone Encounter (Signed)
Attempted to call patient regarding upcoming cardiac PET appointment. Left message on voicemail with name and callback number Aidan Moten RN Navigator Cardiac Imaging [facility] [phone] Office [phone] Cell  

## 2024-06-27 ENCOUNTER — Encounter (HOSPITAL_COMMUNITY)

## 2024-07-09 ENCOUNTER — Telehealth: Payer: Self-pay | Admitting: Cardiology

## 2024-07-09 ENCOUNTER — Telehealth (HOSPITAL_COMMUNITY): Payer: Self-pay | Admitting: Emergency Medicine

## 2024-07-09 NOTE — Telephone Encounter (Signed)
 Patient is requesting a call back to review instructions for NM PET CT [facility] SARCOIDOSIS--specifically, he would like to clarify whether he is to withhold protein 24 hours prior. Please advise.

## 2024-07-09 NOTE — Telephone Encounter (Signed)
 Reaching out to patient to offer assistance regarding upcoming cardiac imaging study; pt verbalizes understanding of appt date/time, parking situation and where to check in, pre-test NPO status and medications ordered, and verified current allergies; name and call back number provided for further questions should they arise Rockwell Alexandria RN Navigator Cardiac Imaging [facility] [phone] office [phone] cell

## 2024-07-10 NOTE — Telephone Encounter (Signed)
 What foods can I eat the day before my test?  Drink only water  or black coffee (WITHOUT sugar, artificial sweetener, cream, or milk). Eggs (prepared without milk or cheese)  Meat that is either broiled or pan fried in butter WITHOUT breading (chicken, turkey, bacon, meat-only sausage, hamburger, steak, fish) Butter, salt & pepper What foods must I AVOID the day before my test?  Do not consume alcoholic beverages, sodas, fruit juice, coffee creamer, or sports drinks  Do not eat vegetables, beans, nuts, fruits, juices, bread, grains, rice, pasta, potatoes, or any baked goods Do not eat dairy products (milk, cheese, etc.)  Do not eat mayonnaise, ketchup, tartar sauce, mustard, or other condiments Do not add sugar, artificial sweeteners, or Splenda (sucralose) to foods or drinks  Do not eat breaded foods (like fried chicken)  Do not eat sweets, candy, gum, sweetened cough drops, lozenges, or sugar  Do not eat sweetened, grilled, or cured meats or meat with carbohydrate-containing additives (some sausages, ham, sweetened bacon)

## 2024-07-11 ENCOUNTER — Encounter (HOSPITAL_COMMUNITY)
Admission: RE | Admit: 2024-07-11 | Discharge: 2024-07-11 | Disposition: A | Discharge status: Home / Self Care | Source: Ambulatory Visit | Attending: Physician Assistant | Admitting: Physician Assistant

## 2024-07-11 DIAGNOSIS — D8685 Sarcoid myocarditis: Secondary | ICD-10-CM | POA: Insufficient documentation

## 2024-07-11 NOTE — Telephone Encounter (Signed)
 PET r/s for 2/5 per Dr. Barbaraann.  Pt was emailed prep info

## 2024-07-16 ENCOUNTER — Telehealth (HOSPITAL_COMMUNITY): Payer: Self-pay | Admitting: Emergency Medicine

## 2024-07-18 ENCOUNTER — Encounter (HOSPITAL_COMMUNITY)

## 2024-07-18 NOTE — Progress Notes (Unsigned)
 "   Cardiology Clinic Note   Patient Name: Jay Day Date of Encounter: 07/18/2024  Primary Care Provider:  Elliot Charm, MD Primary Cardiologist:  [name], MD  Patient Profile    Jay Day 84 year old male presents to the clinic today for follow-up evaluation of his coronary artery disease and to review his cardiac PET.  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   in situ-mid upper chest (txpbx)   Stroke (HCC) 2017   mild cva   Stroke [facility]) 2016   Past Surgical History:  Procedure Laterality Date   COLONOSCOPY N/A 08/22/2016   Procedure: COLONOSCOPY;  Surgeon: Gladis MARLA Louder, MD;  Location: [facility] ENDOSCOPY;  Service: Endoscopy;  Laterality: N/A;   colonscopy  2007   EYE SURGERY     bilateral cataract surgery   HERNIA REPAIR  1960   HERNIA REPAIR     INGUINAL HERNIA REPAIR  08/12/2011   Procedure: LAPAROSCOPIC INGUINAL HERNIA;  Surgeon: Elon CHRISTELLA Pacini, MD;  Location: [facility] OR;  Service: General;  Laterality: Left;   KNEE SURGERY  1962   left    LEFT HEART CATH AND CORONARY ANGIOGRAPHY N/A 12/14/2023   Procedure: LEFT HEART CATH AND CORONARY ANGIOGRAPHY;  Surgeon: Ladona Heinz, MD;  Location: [facility] INVASIVE CV LAB;  Service: Cardiovascular;  Laterality: N/A;   LEFT HEART CATHETERIZATION WITH CORONARY ANGIOGRAM N/A 05/06/2014   Procedure: LEFT HEART CATHETERIZATION WITH CORONARY ANGIOGRAM;  Surgeon: [name], MD;  Location: [facility] CATH LAB;  Service: Cardiovascular;  Laterality: N/A;   LYMPHADENECTOMY Bilateral 09/06/2019   Procedure: LYMPHADENECTOMY;  Surgeon: Alvaro Hummer, MD;  Location: [facility] ORS;  Service: Urology;  Laterality: Bilateral;   ROBOT ASSISTED LAPAROSCOPIC RADICAL PROSTATECTOMY N/A 09/06/2019   Procedure: XI ROBOTIC ASSISTED LAPAROSCOPIC RADICAL PROSTATECTOMY;  Surgeon: Alvaro Hummer, MD;  Location: [facility] ORS;  Service: Urology;  Laterality: N/A;  3 HRS   TONSILLECTOMY     as a child    Allergies  Allergies[1]  History of Present Illness    Jay Day has a PMH of nonobstructive CAD, paroxysmal atrial fibrillation, apical LV aneurysm with thrombus, PE in 2012 and 2013, HTN, and CVA.  He underwent cardiac catheterization 11/15 which showed 30% mid LAD.  His  EF was noted to be 45-50%.  Echocardiogram 4/17 showed an LVEF of 55 to 60% and moderate LVH.  He was hospitalized with syncope in the setting of pneumonia and fever.  Cardiology was consulted.  He was seen by Dr. Jeffrie 2/20 and he was instructed to follow-up as needed.  After several episodes of syncope his symptoms were attributed to vasovagal response.  His metoprolol  was discontinued by Dr. Jeffrie.  He had coronary calcium  scoring done by his PCP 9/22 which showed a total score of 472.  FFR was reassuring.  He had another syncopal event.  He saw Dr. Jeffrie 10/22 and he was referred to EP.  He was seen by Dr. Waddell.  Dr. Waddell recommended watchful waiting.  It was felt  that his syncope in the middle of the night was related to bowel movements/vasovagal.  He was referred to the lipid clinic after having myalgias.  He was restarted on rosuvastatin  5 mg Monday Wednesday Friday.  He followed up with Dr. Jeffrie 6/25 and reported intermittent chest pain after exercise.  Cardiac catheterization was recommended and completed on 7/25.  He was noted to have 30 LAD stenosis.  EF was noted to be 55-60%.  He return to the office 8/25.  He was doing well at that time.  He was still noticing chest pain occasionally.  His symptoms improved after stopping Flexi-melt.  His echocardiogram 9/25 showed an LVEF of 55 to 60%, G1 DD, mild-moderate MR, tricuspid aortic valve, and aorta measuring 40 mm, apical aneurysm was noted.  Limited echocardiogram showed apical aneurysm with thrombus.  Dr. Jeffrie recommended cardiac MRI.  He had this on 04/12/2024.  It showed no LV thrombus.  There was question for sarcoidosis with recommendation for cardiac PET to better assess.  He was seen in the office on follow-up 05/21/2024.  During that time he was doing well.  He continued to exercise daily.  He was swimming and lifting light weights.  He was cooking most of his meals at home.  He was eating minimal knee but enjoyed salmon and chicken.  He occasionally noted chest discomfort.  Discomfort was relieved with antacids.  He denied cardiac chest pain, shortness of breath, palpitations, lower extremity edema, presyncope and syncope.  He presents to the clinic today for follow-up evaluation.  He states***.  Cardiac PET rescheduled to 08/01/2024.  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  Due to potential for sarcoidosis cardiac PET was recommended for further prognostication.  Paroxysmal atrial fibrillation-heart rate today***.  Reports compliance with apixaban .  Denies bleeding issues.  History of bradycardia. Continue apixaban  Avoid triggers caffeine, chocolate, EtOH, dehydration  excetra.  Coronary artery disease-no chest pain today.  Underwent cardiac catheterization 7/25 which showed nonobstructive CAD.  Not a candidate for beta-blocker therapy due to history of bradycardia. Continue atorvastatin  Heart healthy low-sodium diet Maintain physical activity  Hyperlipidemia-LDL***. 05/31/2024: Cholesterol, Total 123; HDL 52; LDL Chol Calc (NIH) 50; Triglycerides 116 Continue rosuvastatin   Essential hypertension-BP today***.  Prior blood pressure medications not able to tolerate due to dizziness and syncope. Heart healthy low-sodium diet Maintain blood pressure log  Disposition: Follow-up with Dr. Jeffrie or me in 6 months.  Home Medications    Prior to Admission medications  Medication Sig Start Date End Date Taking? Authorizing Provider  apixaban  (ELIQUIS ) 5 MG TABS tablet TAKE 1 TABLET(5 MG) BY MOUTH TWICE DAILY 12/15/23   Ladona Heinz, MD  bromocriptine  (PARLODEL ) 2.5 MG tablet Take 0.5 tablets (1.25 mg total) by mouth 3 (three)  times a week. 09/15/23   Thapa, Sudan, MD  dorzolamide -timolol  (COSOPT ) 22.3-6.8 MG/ML ophthalmic solution Place 1 drop into both eyes 2 (two) times daily.  06/12/19   [provider]  fluticasone (CUTIVATE) 0.05 % cream Apply 1 Application topically daily. Patient taking differently: Apply 1 Application topically as needed. 11/16/23   [provider]  latanoprost  (XALATAN ) 0.005 % ophthalmic solution Place 1 drop into both eyes at bedtime. 11/10/20   [provider]  Multiple Vitamins-Minerals (MULTI FOR HIM 50+) TABS Take 1 tablet by mouth daily.    [provider]  nitroGLYCERIN  (NITROSTAT ) 0.4 MG SL tablet Place 1 tablet under the tongue See admin instructions. As needed 04/09/21   [provider]  rosuvastatin  (CRESTOR ) 5 MG tablet Take 5 mg by mouth 3 (three) times a week.    [provider]  Saccharomyces boulardii (PROBIOTIC) 250 MG CAPS Take 250 mg by mouth daily.    [provider]  Turmeric (QC TUMERIC COMPLEX PO) Take 1 capsule by mouth daily.    [provider]    Family History    Family History  Problem Relation Age of Onset   Pulmonary embolism Mother    Kidney failure Mother    Hypertension Mother    Kidney Stones Mother    Pulmonary embolism Father    Prostate cancer Father    Pulmonary embolism Maternal Grandmother    Pulmonary embolism Maternal Grandfather    Pulmonary embolism Paternal Grandmother    Pulmonary embolism Paternal Grandfather    He indicated that his mother is deceased. He indicated that his father is deceased. He indicated that the status of his maternal grandmother is unknown. He indicated that the status of his maternal grandfather is unknown. He indicated that the status of his paternal grandmother is unknown. He indicated that the status of his paternal grandfather is unknown.  Social History    Social History   Socioeconomic History   Marital status: Married    Spouse name: Not on file   Number of children: 2   Years of education: BA   Highest education level: Not on file  Occupational History   Occupation: [employer]  Tobacco Use   Smoking status: Former    Current packs/day: 0.00    Average packs/day: 0.3 packs/day for 5.0 years (1.5 ttl pk-yrs)    Types: Cigarettes    Start date: 1963    Quit date: 1968    Years since quitting: 58.1   Smokeless tobacco: Never  Vaping Use   Vaping status: Never Used  Substance and Sexual Activity   Alcohol use: Never   Drug use: Never   Sexual activity: Not on file  Other Topics Concern   Not on file  Social History Narrative   ** Merged History Encounter **       Patient drinks about 2 cups of caffeine daily. Patient is left handed.   Social Drivers of Health   Tobacco Use: Medium Risk (05/21/2024)   Patient History    Smoking Tobacco Use: Former    Smokeless Tobacco Use: Never    Passive Exposure: Not on Actuary Strain:  Not on file  Food Insecurity: Not on file  Transportation Needs: Not on file  Physical Activity: Not on file  Stress: Not on file  Social Connections: Not on file  Intimate Partner Violence: Not on file  Depression (EYV7-0): Not on file  Alcohol Screen: Not on file  Housing: Not on  file  Utilities: Not on file  Health Literacy: Not on file     Review of Systems    General:  No chills, fever, night sweats or weight changes.  Cardiovascular:  No chest pain, dyspnea on exertion, edema, orthopnea, palpitations, paroxysmal nocturnal dyspnea. Dermatological: No rash, lesions/masses Respiratory: No cough, dyspnea Urologic: No hematuria, dysuria Abdominal:   No nausea, vomiting, diarrhea, bright red blood per rectum, melena, or hematemesis Neurologic:  No visual changes, wkns, changes in mental status. All other systems reviewed and are otherwise negative except as noted above.  Physical Exam    VS:  There were no vitals taken for this visit. , BMI There is no height or weight on file to calculate BMI. GEN: Well nourished, well developed, in no acute distress. HEENT: normal. Neck: Supple, no JVD, carotid bruits, or masses. Cardiac: RRR, no murmurs, rubs, or gallops. No clubbing, cyanosis, edema.  Radials/DP/PT 2+ and equal bilaterally.  Respiratory:  Respirations regular and unlabored, clear to auscultation bilaterally. GI: Soft, nontender, nondistended, BS + x 4. MS: no deformity or atrophy. Skin: warm and dry, no rash. Neuro:  Strength and sensation are intact. Psych: Normal affect.  Accessory Clinical Findings    Recent Labs: 03/22/2024: TSH 3.39 05/31/2024: ALT 19; BUN 23; Creatinine, Ser 1.29; Hemoglobin 15.4; Platelets 131; Potassium 4.4; Sodium 144   Recent Lipid Panel    Component Value Date/Time   CHOL 123 05/31/2024 1146   TRIG 116 05/31/2024 1146   HDL 52 05/31/2024 1146   CHOLHDL 2.4 05/31/2024 1146   CHOLHDL 4.0 09/14/2015 0618   VLDL 39 09/14/2015 0618    LDLCALC 50 05/31/2024 1146    No BP recorded.  {Refresh Note OR Click here to enter BP  :1}***    ECG personally reviewed by me today- ***    Cardiac MRI 04/12/2024  IMPRESSION: 1. 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 No LV apical thrombus seen   2. Subendocardial LGE in focal region of basal inferolateral wall, as well as transmural LGE at apex. While subendocardial LGE is typically seen in ischemic cardiomyopathies, patient had no significant CAD on recent left heart catheterization. Findings could represent embolic events causing infarcts at basal inferolateral wall and apex. Would also consider sarcoidosis on the differential, as can cause subendocardial LGE in non-coronary distribution, and patient noted to have calcified granulomas in mediastinal lymph nodes, liver, and spleen on CT. Recommend FDG-PET to evaluate for sarcoidosis.   3. Mild asymmetric LV hypertrophy, measuring up to 13mm in basal septum (7mm in posterior wall), not meeting criteria for hypertrophic cardiomyopathy (<71mm)   4. Normal LV size and global systolic function (EF 57%), with apical aneurysm as above   5.  Normal RV size and systolic function (EF 51%)   6.  Moderate mitral regurgitation (regurgitant fraction 26%)     Electronically Signed   By: Lonni Nanas M.D.   On: 04/15/2024 12:28      Assessment & Plan   1.  ***   Josefa HERO. Agapita Savarino NP-C     07/18/2024, 7:30 AM [facility] [address] [place], [place] [address] Office [phone]    Notice: This dictation was prepared with Dragon dictation along with smaller phrase technology. Any transcriptional errors that result from this process are unintentional and may not be corrected upon review.   I spent***minutes examining this patient, reviewing medications, and using patient centered shared decision making involving their cardiac care.  I spent  20  minutes reviewing past medical history,  medications, and prior cardiac tests.     [1]  Allergies Allergen Reactions   Gluten Meal Other (See Comments)    Gluten Sensitivity (reaction undefined) NO BREAD   Wheat Other (See Comments)    Reaction undefined   "

## 2024-07-19 ENCOUNTER — Other Ambulatory Visit

## 2024-07-22 ENCOUNTER — Ambulatory Visit: Admitting: General Practice

## 2024-07-23 ENCOUNTER — Ambulatory Visit: Admitting: Endocrinology

## 2024-08-01 ENCOUNTER — Other Ambulatory Visit (HOSPITAL_COMMUNITY)
